# Patient Record
Sex: Female | Born: 1950
Health system: Southern US, Community
[De-identification: ages and names within clinical notes are randomized; demographics above are authoritative.]

## PROBLEM LIST (undated history)

## (undated) DIAGNOSIS — E039 Hypothyroidism, unspecified: Secondary | ICD-10-CM

## (undated) DIAGNOSIS — Z720 Tobacco use: Secondary | ICD-10-CM

## (undated) DIAGNOSIS — I119 Hypertensive heart disease without heart failure: Secondary | ICD-10-CM

## (undated) DIAGNOSIS — I252 Old myocardial infarction: Secondary | ICD-10-CM

## (undated) DIAGNOSIS — I2510 Atherosclerotic heart disease of native coronary artery without angina pectoris: Secondary | ICD-10-CM

## (undated) DIAGNOSIS — J45909 Unspecified asthma, uncomplicated: Secondary | ICD-10-CM

## (undated) DIAGNOSIS — E119 Type 2 diabetes mellitus without complications: Secondary | ICD-10-CM

## (undated) DIAGNOSIS — E785 Hyperlipidemia, unspecified: Secondary | ICD-10-CM

## (undated) DIAGNOSIS — Z955 Presence of coronary angioplasty implant and graft: Secondary | ICD-10-CM

## (undated) HISTORY — PX: ABDOMINAL HYSTERECTOMY: SHX81

## (undated) HISTORY — PX: OOPHORECTOMY: SHX86

## (undated) HISTORY — PX: CORONARY STENT PLACEMENT: SHX1402

## (undated) HISTORY — DX: Hyperlipidemia, unspecified: E78.5

## (undated) HISTORY — DX: Morbid (severe) obesity due to excess calories: E66.01

## (undated) HISTORY — PX: THORACIC OUTLET SURGERY: SHX2502

## (undated) HISTORY — DX: Hypertensive heart disease without heart failure: I11.9

## (undated) HISTORY — DX: Presence of coronary angioplasty implant and graft: Z95.5

## (undated) HISTORY — PX: CHOLECYSTECTOMY: SHX55

## (undated) HISTORY — DX: Atherosclerotic heart disease of native coronary artery without angina pectoris: I25.10

## (undated) HISTORY — PX: ANKLE SURGERY: SHX546

## (undated) HISTORY — PX: ROTATOR CUFF REPAIR: SHX139

## (undated) HISTORY — PX: KNEE ARTHROSCOPY: SUR90

## (undated) HISTORY — PX: WRIST SURGERY: SHX841

## (undated) HISTORY — DX: Tobacco use: Z72.0

---

## 2008-02-21 HISTORY — PX: CORONARY ANGIOPLASTY: SHX604

## 2014-11-15 DIAGNOSIS — M5082 Other cervical disc disorders, mid-cervical region: Secondary | ICD-10-CM | POA: Diagnosis not present

## 2014-12-13 DIAGNOSIS — I10 Essential (primary) hypertension: Secondary | ICD-10-CM | POA: Diagnosis not present

## 2014-12-13 DIAGNOSIS — I2510 Atherosclerotic heart disease of native coronary artery without angina pectoris: Secondary | ICD-10-CM | POA: Diagnosis not present

## 2014-12-13 DIAGNOSIS — E780 Pure hypercholesterolemia: Secondary | ICD-10-CM | POA: Diagnosis not present

## 2015-01-03 DIAGNOSIS — S40862A Insect bite (nonvenomous) of left upper arm, initial encounter: Secondary | ICD-10-CM | POA: Diagnosis not present

## 2015-01-31 DIAGNOSIS — E119 Type 2 diabetes mellitus without complications: Secondary | ICD-10-CM | POA: Diagnosis not present

## 2015-01-31 DIAGNOSIS — E785 Hyperlipidemia, unspecified: Secondary | ICD-10-CM | POA: Diagnosis not present

## 2015-01-31 DIAGNOSIS — E039 Hypothyroidism, unspecified: Secondary | ICD-10-CM | POA: Diagnosis not present

## 2015-02-01 DIAGNOSIS — E039 Hypothyroidism, unspecified: Secondary | ICD-10-CM | POA: Diagnosis not present

## 2015-02-01 DIAGNOSIS — I10 Essential (primary) hypertension: Secondary | ICD-10-CM | POA: Diagnosis not present

## 2015-02-01 DIAGNOSIS — E785 Hyperlipidemia, unspecified: Secondary | ICD-10-CM | POA: Diagnosis not present

## 2015-02-01 DIAGNOSIS — E669 Obesity, unspecified: Secondary | ICD-10-CM | POA: Diagnosis not present

## 2015-02-01 DIAGNOSIS — E049 Nontoxic goiter, unspecified: Secondary | ICD-10-CM | POA: Diagnosis not present

## 2015-02-01 DIAGNOSIS — Z7982 Long term (current) use of aspirin: Secondary | ICD-10-CM | POA: Diagnosis not present

## 2015-02-01 DIAGNOSIS — Z885 Allergy status to narcotic agent status: Secondary | ICD-10-CM | POA: Diagnosis not present

## 2015-02-01 DIAGNOSIS — E119 Type 2 diabetes mellitus without complications: Secondary | ICD-10-CM | POA: Diagnosis not present

## 2015-02-01 DIAGNOSIS — Z7902 Long term (current) use of antithrombotics/antiplatelets: Secondary | ICD-10-CM | POA: Diagnosis not present

## 2015-02-01 DIAGNOSIS — Z888 Allergy status to other drugs, medicaments and biological substances status: Secondary | ICD-10-CM | POA: Diagnosis not present

## 2015-02-01 DIAGNOSIS — E1159 Type 2 diabetes mellitus with other circulatory complications: Secondary | ICD-10-CM | POA: Diagnosis not present

## 2015-02-01 DIAGNOSIS — Z6841 Body Mass Index (BMI) 40.0 and over, adult: Secondary | ICD-10-CM | POA: Diagnosis not present

## 2015-02-01 DIAGNOSIS — F17210 Nicotine dependence, cigarettes, uncomplicated: Secondary | ICD-10-CM | POA: Diagnosis not present

## 2015-03-16 DIAGNOSIS — J441 Chronic obstructive pulmonary disease with (acute) exacerbation: Secondary | ICD-10-CM | POA: Diagnosis not present

## 2015-04-17 DIAGNOSIS — Z13820 Encounter for screening for osteoporosis: Secondary | ICD-10-CM | POA: Diagnosis not present

## 2015-04-17 DIAGNOSIS — N898 Other specified noninflammatory disorders of vagina: Secondary | ICD-10-CM | POA: Diagnosis not present

## 2015-04-17 DIAGNOSIS — Z1239 Encounter for other screening for malignant neoplasm of breast: Secondary | ICD-10-CM | POA: Diagnosis not present

## 2015-04-17 DIAGNOSIS — N644 Mastodynia: Secondary | ICD-10-CM | POA: Diagnosis not present

## 2015-04-17 DIAGNOSIS — Z01419 Encounter for gynecological examination (general) (routine) without abnormal findings: Secondary | ICD-10-CM | POA: Diagnosis not present

## 2015-05-11 DIAGNOSIS — Z7902 Long term (current) use of antithrombotics/antiplatelets: Secondary | ICD-10-CM | POA: Diagnosis not present

## 2015-05-11 DIAGNOSIS — E039 Hypothyroidism, unspecified: Secondary | ICD-10-CM | POA: Diagnosis not present

## 2015-05-11 DIAGNOSIS — Z7982 Long term (current) use of aspirin: Secondary | ICD-10-CM | POA: Diagnosis not present

## 2015-05-11 DIAGNOSIS — Z888 Allergy status to other drugs, medicaments and biological substances status: Secondary | ICD-10-CM | POA: Diagnosis not present

## 2015-05-11 DIAGNOSIS — Z6841 Body Mass Index (BMI) 40.0 and over, adult: Secondary | ICD-10-CM | POA: Diagnosis not present

## 2015-05-11 DIAGNOSIS — E785 Hyperlipidemia, unspecified: Secondary | ICD-10-CM | POA: Diagnosis not present

## 2015-05-11 DIAGNOSIS — E669 Obesity, unspecified: Secondary | ICD-10-CM | POA: Diagnosis not present

## 2015-05-11 DIAGNOSIS — Z885 Allergy status to narcotic agent status: Secondary | ICD-10-CM | POA: Diagnosis not present

## 2015-05-11 DIAGNOSIS — E119 Type 2 diabetes mellitus without complications: Secondary | ICD-10-CM | POA: Diagnosis not present

## 2015-05-11 DIAGNOSIS — E1159 Type 2 diabetes mellitus with other circulatory complications: Secondary | ICD-10-CM | POA: Diagnosis not present

## 2015-05-11 DIAGNOSIS — F17210 Nicotine dependence, cigarettes, uncomplicated: Secondary | ICD-10-CM | POA: Diagnosis not present

## 2015-05-11 DIAGNOSIS — I10 Essential (primary) hypertension: Secondary | ICD-10-CM | POA: Diagnosis not present

## 2015-05-11 DIAGNOSIS — E049 Nontoxic goiter, unspecified: Secondary | ICD-10-CM | POA: Diagnosis not present

## 2015-06-11 DIAGNOSIS — M1712 Unilateral primary osteoarthritis, left knee: Secondary | ICD-10-CM | POA: Diagnosis not present

## 2015-06-26 DIAGNOSIS — Z780 Asymptomatic menopausal state: Secondary | ICD-10-CM | POA: Diagnosis not present

## 2015-06-26 DIAGNOSIS — Z803 Family history of malignant neoplasm of breast: Secondary | ICD-10-CM | POA: Diagnosis not present

## 2015-06-26 DIAGNOSIS — R921 Mammographic calcification found on diagnostic imaging of breast: Secondary | ICD-10-CM | POA: Diagnosis not present

## 2015-06-26 DIAGNOSIS — E119 Type 2 diabetes mellitus without complications: Secondary | ICD-10-CM | POA: Diagnosis not present

## 2015-07-02 DIAGNOSIS — J45909 Unspecified asthma, uncomplicated: Secondary | ICD-10-CM | POA: Diagnosis not present

## 2015-08-08 DIAGNOSIS — E1165 Type 2 diabetes mellitus with hyperglycemia: Secondary | ICD-10-CM | POA: Diagnosis not present

## 2015-08-08 DIAGNOSIS — E039 Hypothyroidism, unspecified: Secondary | ICD-10-CM | POA: Diagnosis not present

## 2015-08-08 DIAGNOSIS — I2510 Atherosclerotic heart disease of native coronary artery without angina pectoris: Secondary | ICD-10-CM | POA: Diagnosis not present

## 2015-08-08 DIAGNOSIS — I10 Essential (primary) hypertension: Secondary | ICD-10-CM | POA: Diagnosis not present

## 2015-08-08 DIAGNOSIS — Z794 Long term (current) use of insulin: Secondary | ICD-10-CM | POA: Diagnosis not present

## 2015-08-08 DIAGNOSIS — E1159 Type 2 diabetes mellitus with other circulatory complications: Secondary | ICD-10-CM | POA: Diagnosis not present

## 2015-08-08 DIAGNOSIS — Z7902 Long term (current) use of antithrombotics/antiplatelets: Secondary | ICD-10-CM | POA: Diagnosis not present

## 2015-08-08 DIAGNOSIS — Z7982 Long term (current) use of aspirin: Secondary | ICD-10-CM | POA: Diagnosis not present

## 2015-08-08 DIAGNOSIS — E049 Nontoxic goiter, unspecified: Secondary | ICD-10-CM | POA: Diagnosis not present

## 2015-08-08 DIAGNOSIS — E119 Type 2 diabetes mellitus without complications: Secondary | ICD-10-CM | POA: Diagnosis not present

## 2015-08-08 DIAGNOSIS — E785 Hyperlipidemia, unspecified: Secondary | ICD-10-CM | POA: Diagnosis not present

## 2015-08-08 DIAGNOSIS — F17210 Nicotine dependence, cigarettes, uncomplicated: Secondary | ICD-10-CM | POA: Diagnosis not present

## 2015-08-21 DIAGNOSIS — Z955 Presence of coronary angioplasty implant and graft: Secondary | ICD-10-CM | POA: Diagnosis not present

## 2015-08-21 DIAGNOSIS — I2510 Atherosclerotic heart disease of native coronary artery without angina pectoris: Secondary | ICD-10-CM | POA: Diagnosis not present

## 2015-08-21 DIAGNOSIS — E7800 Pure hypercholesterolemia, unspecified: Secondary | ICD-10-CM | POA: Diagnosis not present

## 2015-08-29 DIAGNOSIS — J449 Chronic obstructive pulmonary disease, unspecified: Secondary | ICD-10-CM | POA: Diagnosis not present

## 2015-09-20 DIAGNOSIS — J0140 Acute pansinusitis, unspecified: Secondary | ICD-10-CM | POA: Diagnosis not present

## 2015-11-16 DIAGNOSIS — Z7984 Long term (current) use of oral hypoglycemic drugs: Secondary | ICD-10-CM | POA: Diagnosis not present

## 2015-11-16 DIAGNOSIS — Z7982 Long term (current) use of aspirin: Secondary | ICD-10-CM | POA: Diagnosis not present

## 2015-11-16 DIAGNOSIS — E7800 Pure hypercholesterolemia, unspecified: Secondary | ICD-10-CM | POA: Diagnosis not present

## 2015-11-16 DIAGNOSIS — E049 Nontoxic goiter, unspecified: Secondary | ICD-10-CM | POA: Diagnosis not present

## 2015-11-16 DIAGNOSIS — E1165 Type 2 diabetes mellitus with hyperglycemia: Secondary | ICD-10-CM | POA: Diagnosis not present

## 2015-11-16 DIAGNOSIS — Z888 Allergy status to other drugs, medicaments and biological substances status: Secondary | ICD-10-CM | POA: Diagnosis not present

## 2015-11-16 DIAGNOSIS — E785 Hyperlipidemia, unspecified: Secondary | ICD-10-CM | POA: Diagnosis not present

## 2015-11-16 DIAGNOSIS — J45909 Unspecified asthma, uncomplicated: Secondary | ICD-10-CM | POA: Diagnosis not present

## 2015-11-16 DIAGNOSIS — E039 Hypothyroidism, unspecified: Secondary | ICD-10-CM | POA: Diagnosis not present

## 2015-11-16 DIAGNOSIS — I2510 Atherosclerotic heart disease of native coronary artery without angina pectoris: Secondary | ICD-10-CM | POA: Diagnosis not present

## 2015-11-16 DIAGNOSIS — E669 Obesity, unspecified: Secondary | ICD-10-CM | POA: Diagnosis not present

## 2015-11-16 DIAGNOSIS — Z885 Allergy status to narcotic agent status: Secondary | ICD-10-CM | POA: Diagnosis not present

## 2015-11-16 DIAGNOSIS — I252 Old myocardial infarction: Secondary | ICD-10-CM | POA: Diagnosis not present

## 2015-11-16 DIAGNOSIS — F17210 Nicotine dependence, cigarettes, uncomplicated: Secondary | ICD-10-CM | POA: Diagnosis not present

## 2015-11-16 DIAGNOSIS — Z6841 Body Mass Index (BMI) 40.0 and over, adult: Secondary | ICD-10-CM | POA: Diagnosis not present

## 2015-11-16 DIAGNOSIS — E119 Type 2 diabetes mellitus without complications: Secondary | ICD-10-CM | POA: Diagnosis not present

## 2015-11-16 DIAGNOSIS — Z7902 Long term (current) use of antithrombotics/antiplatelets: Secondary | ICD-10-CM | POA: Diagnosis not present

## 2015-11-16 DIAGNOSIS — I10 Essential (primary) hypertension: Secondary | ICD-10-CM | POA: Diagnosis not present

## 2015-11-25 DIAGNOSIS — R05 Cough: Secondary | ICD-10-CM | POA: Diagnosis not present

## 2015-11-25 DIAGNOSIS — J0100 Acute maxillary sinusitis, unspecified: Secondary | ICD-10-CM | POA: Diagnosis not present

## 2015-12-11 DIAGNOSIS — J0140 Acute pansinusitis, unspecified: Secondary | ICD-10-CM | POA: Diagnosis not present

## 2016-01-10 DIAGNOSIS — J0140 Acute pansinusitis, unspecified: Secondary | ICD-10-CM | POA: Diagnosis not present

## 2016-02-15 DIAGNOSIS — F411 Generalized anxiety disorder: Secondary | ICD-10-CM | POA: Diagnosis not present

## 2016-02-15 DIAGNOSIS — H9121 Sudden idiopathic hearing loss, right ear: Secondary | ICD-10-CM | POA: Diagnosis not present

## 2016-02-19 DIAGNOSIS — J0140 Acute pansinusitis, unspecified: Secondary | ICD-10-CM | POA: Diagnosis not present

## 2016-02-22 DIAGNOSIS — M1712 Unilateral primary osteoarthritis, left knee: Secondary | ICD-10-CM | POA: Diagnosis not present

## 2016-02-29 ENCOUNTER — Encounter (HOSPITAL_COMMUNITY): Payer: Self-pay | Admitting: *Deleted

## 2016-02-29 ENCOUNTER — Emergency Department (HOSPITAL_COMMUNITY)
Admission: AD | Admit: 2016-02-29 | Discharge: 2016-02-29 | Disposition: A | Discharge status: Home / Self Care | Payer: Medicare Other | Source: Ambulatory Visit | Attending: Emergency Medicine | Admitting: Emergency Medicine

## 2016-02-29 ENCOUNTER — Inpatient Hospital Stay (HOSPITAL_COMMUNITY): Payer: Medicare Other

## 2016-02-29 DIAGNOSIS — Z79899 Other long term (current) drug therapy: Secondary | ICD-10-CM | POA: Insufficient documentation

## 2016-02-29 DIAGNOSIS — Z955 Presence of coronary angioplasty implant and graft: Secondary | ICD-10-CM | POA: Insufficient documentation

## 2016-02-29 DIAGNOSIS — E039 Hypothyroidism, unspecified: Secondary | ICD-10-CM | POA: Diagnosis not present

## 2016-02-29 DIAGNOSIS — J209 Acute bronchitis, unspecified: Secondary | ICD-10-CM | POA: Insufficient documentation

## 2016-02-29 DIAGNOSIS — F17210 Nicotine dependence, cigarettes, uncomplicated: Secondary | ICD-10-CM | POA: Diagnosis not present

## 2016-02-29 DIAGNOSIS — R0602 Shortness of breath: Secondary | ICD-10-CM

## 2016-02-29 DIAGNOSIS — E119 Type 2 diabetes mellitus without complications: Secondary | ICD-10-CM | POA: Insufficient documentation

## 2016-02-29 DIAGNOSIS — J45909 Unspecified asthma, uncomplicated: Secondary | ICD-10-CM | POA: Diagnosis not present

## 2016-02-29 DIAGNOSIS — Z7982 Long term (current) use of aspirin: Secondary | ICD-10-CM | POA: Diagnosis not present

## 2016-02-29 DIAGNOSIS — Z7984 Long term (current) use of oral hypoglycemic drugs: Secondary | ICD-10-CM | POA: Insufficient documentation

## 2016-02-29 DIAGNOSIS — Z7901 Long term (current) use of anticoagulants: Secondary | ICD-10-CM | POA: Diagnosis not present

## 2016-02-29 DIAGNOSIS — I252 Old myocardial infarction: Secondary | ICD-10-CM | POA: Diagnosis not present

## 2016-02-29 DIAGNOSIS — I10 Essential (primary) hypertension: Secondary | ICD-10-CM | POA: Insufficient documentation

## 2016-02-29 HISTORY — DX: Unspecified asthma, uncomplicated: J45.909

## 2016-02-29 HISTORY — DX: Hypothyroidism, unspecified: E03.9

## 2016-02-29 HISTORY — DX: Type 2 diabetes mellitus without complications: E11.9

## 2016-02-29 HISTORY — DX: Old myocardial infarction: I25.2

## 2016-02-29 LAB — CBC WITH DIFFERENTIAL/PLATELET
BASOS ABS: 0 10*3/uL (ref 0.0–0.1)
Basophils Relative: 0 %
Eosinophils Absolute: 0.2 10*3/uL (ref 0.0–0.7)
Eosinophils Relative: 2 %
HEMATOCRIT: 50.5 % — AB (ref 36.0–46.0)
HEMOGLOBIN: 16.1 g/dL — AB (ref 12.0–15.0)
LYMPHS PCT: 32 %
Lymphs Abs: 3.1 10*3/uL (ref 0.7–4.0)
MCH: 30.6 pg (ref 26.0–34.0)
MCHC: 31.9 g/dL (ref 30.0–36.0)
MCV: 95.8 fL (ref 78.0–100.0)
Monocytes Absolute: 0.6 10*3/uL (ref 0.1–1.0)
Monocytes Relative: 6 %
NEUTROS ABS: 6 10*3/uL (ref 1.7–7.7)
NEUTROS PCT: 60 %
Platelets: 143 10*3/uL — ABNORMAL LOW (ref 150–400)
RBC: 5.27 MIL/uL — AB (ref 3.87–5.11)
RDW: 15.3 % (ref 11.5–15.5)
WBC: 9.8 10*3/uL (ref 4.0–10.5)

## 2016-02-29 LAB — COMPREHENSIVE METABOLIC PANEL
ALBUMIN: 3.6 g/dL (ref 3.5–5.0)
ALT: 15 U/L (ref 14–54)
ANION GAP: 7 (ref 5–15)
AST: 14 U/L — ABNORMAL LOW (ref 15–41)
Alkaline Phosphatase: 78 U/L (ref 38–126)
BILIRUBIN TOTAL: 0.7 mg/dL (ref 0.3–1.2)
BUN: 12 mg/dL (ref 6–20)
CO2: 27 mmol/L (ref 22–32)
Calcium: 9.5 mg/dL (ref 8.9–10.3)
Chloride: 107 mmol/L (ref 101–111)
Creatinine, Ser: 0.86 mg/dL (ref 0.44–1.00)
GFR calc Af Amer: >60 mL/min (ref >60)
GLUCOSE: 137 mg/dL — AB (ref 65–99)
POTASSIUM: 4.1 mmol/L (ref 3.5–5.1)
Sodium: 141 mmol/L (ref 135–145)
TOTAL PROTEIN: 7.3 g/dL (ref 6.5–8.1)

## 2016-02-29 LAB — BRAIN NATRIURETIC PEPTIDE: B Natriuretic Peptide: 38 pg/mL (ref 0.0–100.0)

## 2016-02-29 LAB — TROPONIN I

## 2016-02-29 MED ORDER — IPRATROPIUM-ALBUTEROL 0.5-2.5 (3) MG/3ML IN SOLN
3.0000 mL | Freq: Once | RESPIRATORY_TRACT | Status: AC
  Administered 2016-02-29: 3 mL via RESPIRATORY_TRACT
  Filled 2016-02-29: qty 3

## 2016-02-29 MED ORDER — PREDNISONE 20 MG PO TABS
60.0000 mg | ORAL_TABLET | Freq: Once | ORAL | Status: AC
  Administered 2016-02-29: 60 mg via ORAL
  Filled 2016-02-29: qty 3

## 2016-02-29 MED ORDER — PREDNISONE 20 MG PO TABS
40.0000 mg | ORAL_TABLET | Freq: Every day | ORAL | Status: DC
Start: 2016-02-29 — End: 2016-10-31

## 2016-02-29 MED ORDER — AZITHROMYCIN 250 MG PO TABS: ORAL_TABLET | ORAL | Status: DC

## 2016-02-29 MED ORDER — AZITHROMYCIN 250 MG PO TABS
500.0000 mg | ORAL_TABLET | Freq: Once | ORAL | Status: AC
  Administered 2016-02-29: 500 mg via ORAL
  Filled 2016-02-29: qty 2

## 2016-02-29 NOTE — ED Notes (Signed)
Pt here with SOB x 4 days and back pain on the right side.

## 2016-02-29 NOTE — MAU Note (Addendum)
Pt reports she started having SOB for 4 days. C/o Pain in the back LEft lung when she takes deep breath or movement. Has been on Antibiotic for URI last week. (ended on Saturday)

## 2016-02-29 NOTE — ED Notes (Signed)
Pt ambulates independently and with steady gait at time of discharge. Discharge instructions and follow up information reviewed with patient. No other questions or concerns voiced at this time.  

## 2016-02-29 NOTE — MAU Provider Note (Signed)
History    CSN: [id number] Arrival date and time: 02/29/16 1729 First Provider Initiated Contact with Patient 02/29/16 1819     Chief Complaint  Patient presents with  . Shortness of Breath   HPI [name] [name] is a 65 y.o. female with a history of MI with tsent in 2010, HTN, T2DM, extensive smoking history presenting with shortness of breath and associated left lower lung pain. She reports that she has had sx for 1 week. She reports she had a sinus infection- last dose azithromycin (250 mg), was on Zpack. Lung pain started on Sunday, worsening in the last 5 days.  Reports pain located back left, worse with deep breathing.  Worse with movement.  SOB worsens with exertion and pain worsens with walking. Denies chest pain. No history of CHF per patient but she does take lasix every 5 days for "heaviness in her chest." Unable to lie flat but has baseline 2 pillow orthopnea. Smokes tobacco 1/2 ppd (previously 1 ppd for 26 years).   Last HA1C 10% per patient  OB History    Gravida Para Term Preterm AB TAB SAB Ectopic Multiple Living   2 2        2       Past Medical History  Diagnosis Date  . Hypertension   . MI, old   . Diabetes mellitus without complication ([place])   . Asthma   . Bronchitis, asthmatic   . Hypothyroidism     Past Surgical History  Procedure Laterality Date  . Abdominal hysterectomy    . Coronary stent placement    . Cholecystectomy    . Ankle surgery    . Knee arthroscopy    . Wrist surgery      No family history on file.  Social History  Substance Use Topics  . Smoking status: Current Every Day Smoker -- 0.50 packs/day    Types: Cigarettes  . Smokeless tobacco: None  . Alcohol Use: No    Allergies:  Allergies  Allergen Reactions  . Reglan [Metoclopramide] Other (See Comments)    Hallucinations/Violent   . Dilaudid [Hydromorphone Hcl] Nausea And Vomiting  . Tape Rash    Blisters     Prescriptions prior to admission  Medication Sig Dispense Refill Last  Dose  . aspirin 81 MG tablet Take 81 mg by mouth daily.   02/29/2016 at Unknown time  . atorvastatin (LIPITOR) 10 MG tablet Take 10 mg by mouth daily.  2 02/29/2016 at Unknown time  . celecoxib (CELEBREX) 200 MG capsule Take 200 mg by mouth daily.  2 02/29/2016 at Unknown time  . clopidogrel (PLAVIX) 75 MG tablet Take 75 mg by mouth daily.  2 02/29/2016 at 1400  . furosemide (LASIX) 40 MG tablet Take 40 mg by mouth daily as needed for fluid.   Past Week at Unknown time  . INVOKANA 100 MG TABS tablet Take 100 mg by mouth daily.  11 02/29/2016 at Unknown time  . isosorbide mononitrate (IMDUR) 60 MG 24 hr tablet Take 60 mg by mouth daily.  3 02/29/2016 at Unknown time  . levothyroxine (SYNTHROID, LEVOTHROID) 150 MCG tablet Take 150 mcg by mouth daily.  1 02/29/2016 at Unknown time  . metFORMIN (GLUCOPHAGE) 500 MG tablet Take 500 mg by mouth daily.  3 02/29/2016 at Unknown time  . metoprolol succinate (TOPROL-XL) 50 MG 24 hr tablet Take 50 mg by mouth daily. Take with or immediately following a meal.   02/29/2016 at 1400    Review of Systems  Constitutional: Negative for fever and chills.  Eyes: Negative for blurred vision and double vision.  Respiratory: Positive for cough and shortness of breath.   Cardiovascular: Positive for chest pain. Negative for orthopnea.  Gastrointestinal: Negative for nausea and vomiting.  Genitourinary: Negative for dysuria, frequency and flank pain.  Musculoskeletal: Negative for myalgias.  Skin: Negative for rash.  Neurological: Negative for dizziness, tingling, weakness and headaches.  Endo/Heme/Allergies: Does not bruise/bleed easily.  Psychiatric/Behavioral: Negative for depression and suicidal ideas. The patient is not nervous/anxious.    Physical Exam   Blood pressure 126/77, pulse 87, temperature 98.6 F (37 C), temperature source Oral, resp. rate 22, SpO2 95 %.  Physical Exam  Nursing note and vitals reviewed. Constitutional: She is oriented to person, place, and  time. She appears well-developed and well-nourished. No distress.  Appears older than stated age. Breathless with changes in position.  Talking in complete sentences. Increased RR with reclining to 45 degrees  HENT:  Head: Normocephalic and atraumatic.  Eyes: Conjunctivae are normal. No scleral icterus.  Neck: Normal range of motion. Neck supple. No JVD present.  Cardiovascular: Normal rate and intact distal pulses.  Exam reveals no gallop and no friction rub.   No murmur heard. Distant heart sounds  Respiratory: Effort normal. She has wheezes (occasional wheezes). She has no rales. She exhibits no tenderness.  Increased I:E ratio  GI: Soft. There is no tenderness. There is no rebound and no guarding.  Genitourinary: Vagina normal.  Musculoskeletal: Normal range of motion. She exhibits no edema.  Calves are symmetric, non-erythematous/non tender.   Neurological: She is alert and oriented to person, place, and time.  Skin: Skin is warm and dry. No rash noted.  Psychiatric: She has a normal mood and affect.    MAU Course  Procedures  MDM Drew labs --Troponin, BNP, CMP  Reviewed EKG personally -- NSR, no ST elevations/depressions and no flipped T waves in lateral lead. Does have right axis deviation. I do think this patient is having an acute ST segment MI but needs further evaluation.   6:54 PM Spoke with Dr. Rulon Abide regarding patient transfer to [facility].  She will accept transfer  7:35 PM reviewed CXR- no effusion noted. No curly B lines. Cardiac silhouette is not enlarged  Assessment and Plan  65 y.o. female with history of MI s/p stent in 2010, HTN, poorly controlled DM, extensive smoking history presenting with left posterior chest pain that is exertional, improves with rest associated with shortness of breath. Overall this is likely atypical chest pain but given risk factors for cardiac causes feel patient is best served at [facility].    #Chest pain-- atypical chest  pain but patient with cardiac risk factors, without troponin TIMI score 2  - transfer for proximity to appropriate work up modalities - No need for nitroglycerin/ASA at this time.  - [facility] arrived to transport the patient  Caren Macadam 02/29/2016, 6:19 PM

## 2016-02-29 NOTE — ED Provider Notes (Signed)
CSN: [id number]     Arrival date & time 02/29/16  1729 History   First MD Initiated Contact with Patient 02/29/16 2011     Chief Complaint  Patient presents with  . Shortness of Breath    (Consider location/radiation/quality/duration/timing/severity/associated sxs/prior Treatment) Patient is a 65 y.o. female presenting with shortness of breath. The history is provided by the patient and medical records. No language interpreter was used.  Shortness of Breath Associated symptoms: cough   Associated symptoms: no abdominal pain, no fever, no headaches, no neck pain, no rash, no vomiting and no wheezing    [name] [name] is a 65 y.o. female  with a PMH of HTN, DM, asthma, MI 15 yrs ago with stent placement who presents to the Emergency Department complaining of left sided "lung pain" that is worse with breathing x 4 days. Associated shortness of breath with exertion, nasal congestion, and productive cough. No shortness of breath at rest. No chest pain, abdominal, pain, diaphoresis, n/v/d, fever, back pain. Home inhalers and nebulizer used with minimal relief. Patient states pain is similar to when she had pleurisy in the past. + Every day smoker.   Past Medical History  Diagnosis Date  . Hypertension   . MI, old   . Diabetes mellitus without complication ([place])   . Asthma   . Bronchitis, asthmatic   . Hypothyroidism    Past Surgical History  Procedure Laterality Date  . Abdominal hysterectomy    . Coronary stent placement    . Cholecystectomy    . Ankle surgery    . Knee arthroscopy    . Wrist surgery     No family history on file. Social History  Substance Use Topics  . Smoking status: Current Every Day Smoker -- 0.50 packs/day    Types: Cigarettes  . Smokeless tobacco: None  . Alcohol Use: No   OB History    Gravida Para Term Preterm AB TAB SAB Ectopic Multiple Living   2 2        2      Review of Systems  Constitutional: Negative for fever and chills.  HENT: Negative for  congestion.   Eyes: Negative for visual disturbance.  Respiratory: Positive for cough and shortness of breath. Negative for wheezing.   Cardiovascular: Negative.   Gastrointestinal: Negative for nausea, vomiting and abdominal pain.  Genitourinary: Negative for dysuria.  Musculoskeletal: Negative for back pain and neck pain.  Skin: Negative for rash.  Neurological: Negative for dizziness and headaches.      Allergies  Reglan; Dilaudid; and Tape  Home Medications   Prior to Admission medications   Medication Sig Start Date End Date Taking? Authorizing Provider  aspirin 81 MG tablet Take 81 mg by mouth daily.   Yes Historical Provider, MD  atorvastatin (LIPITOR) 10 MG tablet Take 10 mg by mouth daily. 02/18/16  Yes Historical Provider, MD  celecoxib (CELEBREX) 200 MG capsule Take 200 mg by mouth daily. 02/22/16  Yes Historical Provider, MD  clopidogrel (PLAVIX) 75 MG tablet Take 75 mg by mouth daily. 01/10/16  Yes Historical Provider, MD  furosemide (LASIX) 40 MG tablet Take 40 mg by mouth daily as needed for fluid.   Yes Historical Provider, MD  INVOKANA 100 MG TABS tablet Take 100 mg by mouth daily. 02/13/16  Yes Historical Provider, MD  isosorbide mononitrate (IMDUR) 60 MG 24 hr tablet Take 60 mg by mouth daily. 11/30/15  Yes Historical Provider, MD  levothyroxine (SYNTHROID, LEVOTHROID) 150 MCG tablet Take 150 mcg  by mouth daily. 12/12/15  Yes Historical Provider, MD  metFORMIN (GLUCOPHAGE) 500 MG tablet Take 500 mg by mouth daily. 12/12/15  Yes Historical Provider, MD  metoprolol succinate (TOPROL-XL) 50 MG 24 hr tablet Take 50 mg by mouth daily. Take with or immediately following a meal.   Yes Historical Provider, MD   BP 103/69 mmHg  Pulse 71  Temp(Src) 98.6 F (37 C) (Oral)  Resp 18  SpO2 97% Physical Exam  Constitutional: She is oriented to person, place, and time. She appears well-developed and well-nourished.  Alert and in no acute distress  HENT:  Head: Normocephalic and  atraumatic.  OP with erythema, no exudates.  Cardiovascular: Normal rate, regular rhythm and normal heart sounds.   Pulmonary/Chest: Effort normal. No respiratory distress. She exhibits no tenderness.  Diminished breath sounds with expiratory wheezing bilaterally.   Abdominal: Soft. She exhibits no distension. There is no tenderness.  Musculoskeletal: She exhibits no edema.  Neurological: She is alert and oriented to person, place, and time.  Skin: Skin is warm and dry.  Nursing note and vitals reviewed.   ED Course  Procedures (including critical care time) Labs Review Labs Reviewed  COMPREHENSIVE METABOLIC PANEL - Abnormal; Notable for the following:    Glucose, Bld 137 (*)    AST 14 (*)    All other components within normal limits  TROPONIN I  BRAIN NATRIURETIC PEPTIDE    Imaging Review Dg Chest 2 View  02/29/2016  CLINICAL DATA:  Shortness of breath for 1 week EXAM: CHEST  2 VIEW COMPARISON:  None. FINDINGS: Cardiac shadow is within normal limits. Lungs are hyperaerated bilaterally. No focal infiltrate or sizable effusion is seen. No acute bony abnormality is noted. Calcified granuloma is noted in the left mid lung. IMPRESSION: COPD without acute abnormality. Electronically Signed   By: Inez Catalina M.D.   On: 02/29/2016 19:24   I have personally reviewed and evaluated these images and lab results as part of my medical decision-making.   EKG Interpretation None      MDM   Final diagnoses:  Shortness of breath   [name] presents with worsening productive cough, shortness of breath. History of COPD.  Labs: CBC, CMP, troponin, BNP reviewed and reassuring. Imaging: Chest x-ray unremarkable. EKG reassuring.  DuoNeb given in ED. Patient evaluated with on sounds improved and shortness of breath improved.  Given worsening productive cough, + smoker, and COPD will tx with azithro. Short steroid burst given for wheezing/sob. PCP follow up strongly encouraged. Return  precautions given and all questions answered.    [facility] [place], PA-C 02/29/16 2131  Fredia Sorrow, MD 03/07/16 1021

## 2016-02-29 NOTE — Discharge Instructions (Signed)
Please take all of your antibiotics until finished!  Take steroid as directed.  Continue using your home inhalers.  Follow-up with your primary care physician in regards to today's visit. Return to the ER for new or worsening symptoms, any additional concerns.

## 2016-04-11 ENCOUNTER — Telehealth: Payer: Self-pay | Admitting: Cardiovascular Disease

## 2016-04-11 NOTE — Telephone Encounter (Signed)
Records received from [facility] for apt on 04/16/16 w/ Dr Oval Linsey given to nenita H in medical records

## 2016-04-14 DIAGNOSIS — E1165 Type 2 diabetes mellitus with hyperglycemia: Secondary | ICD-10-CM | POA: Diagnosis not present

## 2016-04-14 DIAGNOSIS — E039 Hypothyroidism, unspecified: Secondary | ICD-10-CM | POA: Diagnosis not present

## 2016-04-14 DIAGNOSIS — I2510 Atherosclerotic heart disease of native coronary artery without angina pectoris: Secondary | ICD-10-CM | POA: Diagnosis not present

## 2016-04-16 ENCOUNTER — Encounter: Payer: Self-pay | Admitting: Cardiovascular Disease

## 2016-04-16 ENCOUNTER — Ambulatory Visit (INDEPENDENT_AMBULATORY_CARE_PROVIDER_SITE_OTHER): Payer: Medicare Other | Admitting: Cardiovascular Disease

## 2016-04-16 VITALS — BP 110/80 | HR 66 | Ht 65.0 in | Wt 281.2 lb

## 2016-04-16 DIAGNOSIS — E785 Hyperlipidemia, unspecified: Secondary | ICD-10-CM

## 2016-04-16 DIAGNOSIS — Z955 Presence of coronary angioplasty implant and graft: Secondary | ICD-10-CM

## 2016-04-16 DIAGNOSIS — I119 Hypertensive heart disease without heart failure: Secondary | ICD-10-CM | POA: Diagnosis not present

## 2016-04-16 DIAGNOSIS — Z720 Tobacco use: Secondary | ICD-10-CM

## 2016-04-16 DIAGNOSIS — F17200 Nicotine dependence, unspecified, uncomplicated: Secondary | ICD-10-CM | POA: Insufficient documentation

## 2016-04-16 DIAGNOSIS — Z79899 Other long term (current) drug therapy: Secondary | ICD-10-CM

## 2016-04-16 DIAGNOSIS — M170 Bilateral primary osteoarthritis of knee: Secondary | ICD-10-CM | POA: Diagnosis not present

## 2016-04-16 DIAGNOSIS — E1169 Type 2 diabetes mellitus with other specified complication: Secondary | ICD-10-CM | POA: Insufficient documentation

## 2016-04-16 HISTORY — DX: Presence of coronary angioplasty implant and graft: Z95.5

## 2016-04-16 HISTORY — DX: Hypertensive heart disease without heart failure: I11.9

## 2016-04-16 HISTORY — DX: Tobacco use: Z72.0

## 2016-04-16 HISTORY — DX: Morbid (severe) obesity due to excess calories: E66.01

## 2016-04-16 MED ORDER — CLOPIDOGREL BISULFATE 75 MG PO TABS
75.0000 mg | ORAL_TABLET | Freq: Every day | ORAL | 3 refills | Status: DC
Start: 2016-04-16 — End: 2016-10-31

## 2016-04-16 NOTE — Patient Instructions (Signed)
Your physician wants you to follow-up in: 6 months with Dr. Oval Linsey. You will receive a reminder letter in the mail two months in advance. If you don't receive a letter, please call our office to schedule the follow-up appointment.  If you need a refill on your cardiac medications before your next appointment, please call your pharmacy.

## 2016-04-16 NOTE — Progress Notes (Signed)
Cardiology Office Note   Date:  04/16/2016   ID:  [name] [name], [name] [name] 1951/02/25, MRN [id number]  PCP:  No PCP Per Patient  Cardiologist:   Skeet Latch, MD   Chief Complaint  Patient presents with  . Follow-up    Pt states no Sx. Pt just need refills on medication.     History of Present Illness: [name] [name] is a 65 y.o. female with CAD /sp PCI, COPD, morbid obesity, chronic bronchitis, and hypothyroidism who presents to establish care.  [name] recently moved here from [place].  She saw her cardiologist, Dr. Jannet [name] on 07/2015.  She had a BMS in the LAD 02/2008.  Since then she has done very well.  She had a 2 day Persantine Cardiolite on 01/07/13 that was negative for ischemia. LVEF was 79%.  She denies any recent chest pain or shortness of breath.  She does not get any formal exercise but cleans her house and denies exertional symptoms.  She lost nearly 50lb by changing her diet and eliminating sweets.  She continues to smoke 4 cigarettes daily, down from 1 ppd.  She also uses a vapor cigarette twice daily.  [name] denies lower extremity edema, orthopnea or PND.     Past Medical History:  Diagnosis Date  . Coronary artery disease    BMS to LAD 02/2008  . Diabetes mellitus without complication ([place])   . Hyperlipidemia   . Hypertensive heart disease 04/16/2016  . Morbid obesity ([place])   . Morbid obesity ([place]) 04/16/2016  . S/P coronary artery stent placement 04/16/2016   BMS to LAD 2009  . Tobacco abuse 04/16/2016    Past Surgical History:  Procedure Laterality Date  . ABDOMINAL HYSTERECTOMY    . ANKLE SURGERY Right   . CHOLECYSTECTOMY    . CORONARY ANGIOPLASTY  02/2008   bare metal stent to LAD   . KNEE ARTHROSCOPY    . ROTATOR CUFF REPAIR Bilateral   . THORACIC OUTLET SURGERY       Current Outpatient Prescriptions  Medication Sig Dispense Refill  . ALPRAZolam (XANAX) 0.5 MG tablet Take 0.5 mg by mouth as needed for anxiety.    Marland Kitchen aspirin 81 MG tablet Take 81  mg by mouth daily.    Marland Kitchen atorvastatin (LIPITOR) 10 MG tablet Take 10 mg by mouth daily.    . canagliflozin (INVOKANA) 100 MG TABS tablet Take 200 mg by mouth daily before breakfast.     . celecoxib (CELEBREX) 200 MG capsule Take 200 mg by mouth 2 (two) times daily.    . clopidogrel (PLAVIX) 75 MG tablet Take 1 tablet (75 mg total) by mouth daily. 90 tablet 3  . Fluticasone-Salmeterol (ADVAIR DISKUS) 250-50 MCG/DOSE AEPB Inhale 1 puff into the lungs 2 (two) times daily.    . furosemide (LASIX) 40 MG tablet Take 40 mg by mouth.    . isosorbide mononitrate (IMDUR) 60 MG 24 hr tablet Take 60 mg by mouth daily.    Marland Kitchen levothyroxine (SYNTHROID, LEVOTHROID) 175 MCG tablet Take 175 mcg by mouth daily before breakfast.    . metFORMIN (GLUCOPHAGE) 500 MG tablet Take 500 mg by mouth daily with breakfast.    . metoprolol succinate (TOPROL-XL) 50 MG 24 hr tablet Take 75 mg by mouth daily. Take with or immediately following a meal.    . nitroGLYCERIN (NITROSTAT) 0.4 MG SL tablet Place 0.4 mg under the tongue every 5 (five) minutes as needed for chest pain.     No  current facility-administered medications for this visit.     Allergies:   Codeine; Dilaudid [hydromorphone hcl]; Iodides; and Reglan [metoclopramide]    Social History:  The patient  reports that she has been smoking.  She does not have any smokeless tobacco history on file.   Family History:  The patient's family history includes Cancer in her mother.    ROS:  Please see the history of present illness.   Otherwise, review of systems are positive for back and knee pain.   All other systems are reviewed and negative.    PHYSICAL EXAM: VS:  BP 110/80   Pulse 66   Ht 5\' 5"  (1.651 m)   Wt 281 lb 3.2 oz (127.6 kg)   BMI 46.79 kg/m  , BMI Body mass index is 46.79 kg/m. GENERAL:  Well appearing.  Morbidly obese HEENT:  Pupils equal round and reactive, fundi not visualized, oral mucosa unremarkable NECK:  No jugular venous distention, waveform  within normal limits, carotid upstroke brisk and symmetric, no bruits, no thyromegaly LYMPHATICS:  No cervical adenopathy LUNGS:  Clear to auscultation bilaterally HEART:  RRR.  PMI not displaced or sustained,S1 and S2 within normal limits, no S3, no S4, no clicks, no rubs, no murmurs ABD:  Flat, positive bowel sounds normal in frequency in pitch, no bruits, no rebound, no guarding, no midline pulsatile mass, no hepatomegaly, no splenomegaly EXT:  2 plus pulses throughout, no edema, no cyanosis no clubbing SKIN:  No rashes no nodules NEURO:  Cranial nerves II through XII grossly intact, motor grossly intact throughout PSYCH:  Cognitively intact, oriented to person place and time   EKG:  EKG is ordered today. The ekg ordered today demonstrates sinus rhythm rate 66 bpm.    Recent Labs: No results found for requested labs within last 8760 hours.    Lipid Panel No results found for: CHOL, TRIG, HDL, CHOLHDL, VLDL, LDLCALC, LDLDIRECT    Wt Readings from Last 3 Encounters:  04/16/16 281 lb 3.2 oz (127.6 kg)      ASSESSMENT AND PLAN:  # CAD s/p BMS to LAD: [name] is doing well and denies any angina.  She has been on DAPT since her stent placement in 2009. It would be reasonable to stop the clopidogrel.  However, given that she has been Stable and denies any bleeding, she would prefer to continue it.  Continue Imdur and metoprolol.  # Hypertensive heart disease: BP well-controlled.  Continue metoprolol.  # Hyperlipidemia: Continue atorvastatin.  Check lipids at follow-up.  # Morbid obesity: Ms. Cure was congratulated on her weight loss and encouraged to continue.  # Tobacco abuse: Patient was encouraged to keep cutting back.   Current medicines are reviewed at length with the patient today.  The patient does not have concerns regarding medicines.  The following changes have been made:  no change  Labs/ tests ordered today include:   Orders Placed This Encounter  Procedures    . EKG 12-Lead     Disposition:   FU with [name] Sicard C. Oval Linsey, MD, [facility] in 6 months.      This note was written with the assistance of speech recognition software.  Please excuse any transcriptional errors.  Signed, Eyad Rochford C. Oval Linsey, MD, [facility]  04/16/2016 4:54 PM    [facility]

## 2016-04-17 ENCOUNTER — Encounter (HOSPITAL_COMMUNITY): Payer: Self-pay | Admitting: *Deleted

## 2016-05-21 DIAGNOSIS — M1712 Unilateral primary osteoarthritis, left knee: Secondary | ICD-10-CM | POA: Diagnosis not present

## 2016-05-21 DIAGNOSIS — M5136 Other intervertebral disc degeneration, lumbar region: Secondary | ICD-10-CM | POA: Diagnosis not present

## 2016-05-23 ENCOUNTER — Other Ambulatory Visit: Payer: Self-pay | Admitting: *Deleted

## 2016-05-27 MED ORDER — ATORVASTATIN CALCIUM 10 MG PO TABS: 10.0000 mg | ORAL_TABLET | Freq: Every day | ORAL | 10 refills | Status: DC

## 2016-05-27 NOTE — Telephone Encounter (Signed)
Rx(s) sent to pharmacy electronically.  

## 2016-06-02 ENCOUNTER — Other Ambulatory Visit: Payer: Self-pay

## 2016-06-02 MED ORDER — ISOSORBIDE MONONITRATE ER 60 MG PO TB24: 60.0000 mg | ORAL_TABLET | Freq: Every day | ORAL | 3 refills | Status: DC

## 2016-06-04 DIAGNOSIS — M1712 Unilateral primary osteoarthritis, left knee: Secondary | ICD-10-CM | POA: Diagnosis not present

## 2016-06-23 DIAGNOSIS — J0140 Acute pansinusitis, unspecified: Secondary | ICD-10-CM | POA: Diagnosis not present

## 2016-08-18 DIAGNOSIS — E1165 Type 2 diabetes mellitus with hyperglycemia: Secondary | ICD-10-CM | POA: Diagnosis not present

## 2016-08-18 DIAGNOSIS — E039 Hypothyroidism, unspecified: Secondary | ICD-10-CM | POA: Diagnosis not present

## 2016-08-18 DIAGNOSIS — E7800 Pure hypercholesterolemia, unspecified: Secondary | ICD-10-CM | POA: Diagnosis not present

## 2016-10-20 DIAGNOSIS — E039 Hypothyroidism, unspecified: Secondary | ICD-10-CM | POA: Diagnosis not present

## 2016-10-24 ENCOUNTER — Ambulatory Visit: Payer: Self-pay | Admitting: Cardiovascular Disease

## 2016-10-30 NOTE — Progress Notes (Signed)
Cardiology Office Note   Date:  10/31/2016   ID:  Vayah, Blot 08-Jun-1951, MRN [id number]  PCP:  Briscoe Deutscher, DO  Cardiologist:   Skeet Latch, MD   No chief complaint on file.    History of Present Illness: [name] [name] is a 66 y.o. female with CAD s/p PCI, COPD, morbid obesity, chronic bronchitis, and hypothyroidism who presents to for follow up.  [name] [name] recently moved here from [place].  She had a BMS in the LAD 02/2008.  Since then she has done very well.  She had a 2 day Persantine Cardiolite on 01/07/13 that was negative for ischemia. LVEF was 79%.  She denies any recent chest pain or shortness of breath. She has been feeling stressed lately. Her son had a bad accident at his job in October and broke both his wrist and femur. He was living in the home with her and recently was able to move back to his home. She's also the primary caretaker of her 54-year-old grandson who has ADD. Her husband also suffers from posttraumatic stress disorder.  She hasn't experienced any chest pain or pressure. Her breathing has been fine. She hasn't been able to exercise lately because of pain in her left knee. She has been getting injections in the knee but it is starting to wear off. She denies lower extremity edema, orthopnea, or PND.    She does not get any formal exercise but cleans her house and denies exertional symptoms.  She lost nearly 50lb by changing her diet and eliminating sweets.  She continues to smoke 4 cigarettes daily, down from 1 ppd.   [name] [name] denies lower extremity edema, orthopnea or PND.  [name] [name] reports occasional dizziness.  She had an episode of syncope that occurred this AM after getting out of the shower.  She notes that she hasn't been eating or drinking well lately due to a GI virus.  She has also been experiencing diarrhea. This morning after taking a shower she bent over to dry her hair and briefly passed out. There is no preceding chest pain, shortness of breath, or  palpitations.  She currently denies lightheadedness or dizziness.   She hasn't been taking her lasix lately and only takes it when she feels volume overloaded.     Past Medical History:  Diagnosis Date  . Asthma   . Bronchitis, asthmatic   . Coronary artery disease    BMS to LAD 02/2008  . Diabetes mellitus without complication ([place])   . Hyperlipidemia   . Hypertension   . Hypertensive heart disease 04/16/2016  . Hypothyroidism   . MI, old   . Morbid obesity ([place])   . Morbid obesity ([place]) 04/16/2016  . S/P coronary artery stent placement 04/16/2016   BMS to LAD 2009  . Tobacco abuse 04/16/2016    Past Surgical History:  Procedure Laterality Date  . ABDOMINAL HYSTERECTOMY    . ANKLE SURGERY    . ANKLE SURGERY Right   . CHOLECYSTECTOMY    . CORONARY ANGIOPLASTY  02/2008   bare metal stent to LAD   . CORONARY STENT PLACEMENT    . KNEE ARTHROSCOPY    . ROTATOR CUFF REPAIR Bilateral   . THORACIC OUTLET SURGERY    . WRIST SURGERY       Current Outpatient Prescriptions  Medication Sig Dispense Refill  . ALPRAZolam (XANAX) 0.5 MG tablet Take 0.5 mg by mouth as needed for anxiety.    Marland Kitchen aspirin 81 MG  tablet Take 81 mg by mouth daily.    Marland Kitchen atorvastatin (LIPITOR) 10 MG tablet Take 1 tablet (10 mg total) by mouth daily. 30 tablet 10  . celecoxib (CELEBREX) 200 MG capsule Take 200 mg by mouth daily.  2  . clopidogrel (PLAVIX) 75 MG tablet Take 75 mg by mouth daily.  2  . Fluticasone-Salmeterol (ADVAIR DISKUS) 250-50 MCG/DOSE AEPB Inhale 1 puff into the lungs 2 (two) times daily.    . furosemide (LASIX) 40 MG tablet Take 40 mg by mouth daily as needed for fluid.    Marland Kitchen gabapentin (NEURONTIN) 300 MG capsule Take 300 mg by mouth 2 (two) times daily.    . INVOKANA 300 MG TABS tablet Take 300 mg by mouth daily.    . isosorbide mononitrate (IMDUR) 60 MG 24 hr tablet Take 1 tablet (60 mg total) by mouth daily. 90 tablet 3  . levothyroxine (SYNTHROID, LEVOTHROID) 125 MCG tablet Take 125 mcg by  mouth daily.    . metFORMIN (GLUCOPHAGE) 500 MG tablet Take 500 mg by mouth daily.  3  . metoprolol succinate (TOPROL-XL) 50 MG 24 hr tablet Take 75 mg by mouth daily. Take with or immediately following a meal.    . nitroGLYCERIN (NITROSTAT) 0.4 MG SL tablet Place 0.4 mg under the tongue every 5 (five) minutes as needed for chest pain.     No current facility-administered medications for this visit.     Allergies:   Codeine; Dilaudid [hydromorphone hcl]; Iodides; Reglan [metoclopramide]; Reglan [metoclopramide]; Tramadol; Dilaudid [hydromorphone hcl]; and Tape    Social History:  The patient  reports that she has been smoking.  She has never used smokeless tobacco. She reports that she does not drink alcohol or use drugs.   Family History:  The patient's family history includes Cancer in her mother.    ROS:  Please see the history of present illness.   Otherwise, review of systems are positive for knee pain.   All other systems are reviewed and negative.    PHYSICAL EXAM: VS:  BP 126/72   Pulse 74   Ht 5\' 5"  (1.651 m)   Wt 129.3 kg (285 lb)   BMI 47.43 kg/m  , BMI Body mass index is 47.43 kg/m. GENERAL:  Well appearing.  Morbidly obese HEENT:  Pupils equal round and reactive, fundi not visualized, oral mucosa unremarkable NECK:  No jugular venous distention, waveform within normal limits, carotid upstroke brisk and symmetric, no bruits LYMPHATICS:  No cervical adenopathy LUNGS:  Clear to auscultation bilaterally HEART:  RRR.  PMI not displaced or sustained,S1 and S2 within normal limits, no S3, no S4, no clicks, no rubs, no murmurs ABD:  Flat, positive bowel sounds normal in frequency in pitch, no bruits, no rebound, no guarding, no midline pulsatile mass, no hepatomegaly, no splenomegaly EXT:  2 plus pulses throughout, no edema, no cyanosis no clubbing SKIN:  No rashes no nodules NEURO:  Cranial nerves II through XII grossly intact, motor grossly intact throughout PSYCH:   Cognitively intact, oriented to person place and time   EKG:  EKG is ordered today. The ekg ordered today demonstrates sinus rhythm rate 74 bpm. LPFB.   Recent Labs: 02/29/2016: ALT 15; B Natriuretic Peptide 38.0; BUN 12; Creatinine, Ser 0.86; Hemoglobin 16.1; Platelets 143; Potassium 4.1; Sodium 141    Lipid Panel No results found for: CHOL, TRIG, HDL, CHOLHDL, VLDL, LDLCALC, LDLDIRECT    Wt Readings from Last 3 Encounters:  10/31/16 129.3 kg (285 lb)  04/16/16  127.6 kg (281 lb 3.2 oz)  02/29/16 99.8 kg (220 lb)      ASSESSMENT AND PLAN:  # CAD s/p BMS to LAD: [name] [name] is doing well and denies any angina.  She has been on DAPT since her stent placement in 2009. It would be reasonable to stop the clopidogrel.  However, given that she has been stable and denies any bleeding, she would prefer to continue it.  Continue Imdur and metoprolol.  It can be held for procedures if necessary.  # Hypertensive heart disease: BP well-controlled.  Continue metoprolol and Imdur.   # Hyperlipidemia: Continue atorvastatin.  Check lipids and CMP today.    # Morbid obesity: [name] [name] was encouraged to start back her exercise when able.    # Tobacco abuse: Patient was encouraged to keep cutting back.   Current medicines are reviewed at length with the patient today.  The patient does not have concerns regarding medicines.  The following changes have been made:  no change  Labs/ tests ordered today include:   Orders Placed This Encounter  Procedures  . Lipid panel  . Comprehensive metabolic panel     Disposition:   FU with [name] Brandel C. Oval Linsey, MD, [facility] in 1 year  This note was written with the assistance of speech recognition software.  Please excuse any transcriptional errors.  Signed, Ayris Carano C. Oval Linsey, MD, [facility]  10/31/2016 1:31 PM    [place]

## 2016-10-31 ENCOUNTER — Other Ambulatory Visit: Payer: Self-pay | Admitting: *Deleted

## 2016-10-31 ENCOUNTER — Ambulatory Visit (INDEPENDENT_AMBULATORY_CARE_PROVIDER_SITE_OTHER): Payer: Medicare Other | Admitting: Cardiovascular Disease

## 2016-10-31 ENCOUNTER — Encounter: Payer: Self-pay | Admitting: Cardiovascular Disease

## 2016-10-31 VITALS — BP 126/72 | HR 74 | Ht 65.0 in | Wt 285.0 lb

## 2016-10-31 DIAGNOSIS — E785 Hyperlipidemia, unspecified: Secondary | ICD-10-CM | POA: Diagnosis not present

## 2016-10-31 DIAGNOSIS — Z79899 Other long term (current) drug therapy: Secondary | ICD-10-CM | POA: Diagnosis not present

## 2016-10-31 DIAGNOSIS — Z9861 Coronary angioplasty status: Secondary | ICD-10-CM | POA: Diagnosis not present

## 2016-10-31 DIAGNOSIS — E049 Nontoxic goiter, unspecified: Secondary | ICD-10-CM | POA: Insufficient documentation

## 2016-10-31 DIAGNOSIS — Z955 Presence of coronary angioplasty implant and graft: Secondary | ICD-10-CM

## 2016-10-31 DIAGNOSIS — I119 Hypertensive heart disease without heart failure: Secondary | ICD-10-CM

## 2016-10-31 DIAGNOSIS — Z720 Tobacco use: Secondary | ICD-10-CM

## 2016-10-31 LAB — LIPID PANEL
Cholesterol: 130 mg/dL (ref <200)
HDL: 43 mg/dL — AB (ref >50)
LDL CALC: 46 mg/dL (ref <100)
Total CHOL/HDL Ratio: 3 Ratio (ref <5.0)
Triglycerides: 203 mg/dL — ABNORMAL HIGH (ref <150)
VLDL: 41 mg/dL — AB (ref <30)

## 2016-10-31 LAB — COMPREHENSIVE METABOLIC PANEL
ALT: 14 U/L (ref 6–29)
AST: 16 U/L (ref 10–35)
Albumin: 3.9 g/dL (ref 3.6–5.1)
Alkaline Phosphatase: 95 U/L (ref 33–130)
BUN: 13 mg/dL (ref 7–25)
CHLORIDE: 103 mmol/L (ref 98–110)
CO2: 29 mmol/L (ref 20–31)
CREATININE: 0.97 mg/dL (ref 0.50–0.99)
Calcium: 9.8 mg/dL (ref 8.6–10.4)
GLUCOSE: 136 mg/dL — AB (ref 65–99)
Potassium: 4.7 mmol/L (ref 3.5–5.3)
SODIUM: 140 mmol/L (ref 135–146)
TOTAL PROTEIN: 7.1 g/dL (ref 6.1–8.1)
Total Bilirubin: 0.9 mg/dL (ref 0.2–1.2)

## 2016-10-31 NOTE — Patient Instructions (Signed)
Medication Instructions:  Your physician recommends that you continue on your current medications as directed. Please refer to the Current Medication list given to you today.  Labwork: LP/CMET AT [facility] ON THE FIRST FLOOR  Testing/Procedures: NONE  Follow-Up: Your physician wants you to follow-up in: 1 [place] will receive a reminder letter in the mail two months in advance. If you don't receive a letter, please call our office to schedule the follow-up appointment.  If you need a refill on your cardiac medications before your next appointment, please call your pharmacy.

## 2016-10-31 NOTE — Addendum Note (Signed)
Addended by: Alvina Filbert B on: 10/31/2016 03:29 PM   Modules accepted: Orders

## 2016-11-03 ENCOUNTER — Telehealth: Payer: Self-pay | Admitting: Cardiovascular Disease

## 2016-11-03 MED ORDER — FUROSEMIDE 40 MG PO TABS
40.0000 mg | ORAL_TABLET | Freq: Every day | ORAL | 6 refills | Status: DC | PRN
Start: 2016-11-03 — End: 2017-12-31

## 2016-11-03 NOTE — Telephone Encounter (Signed)
New message       Pt was seen last Friday.  She said Dr Oval Linsey was going to call in a new presc for furosemide 40mg  to [facility] at [address].  It has not been called in.  Please let her know if Dr Oval Linsey still want her to have this presc.

## 2016-11-03 NOTE — Telephone Encounter (Signed)
Returned call to patient lasix refill sent to pharmacy.

## 2016-11-04 ENCOUNTER — Ambulatory Visit (INDEPENDENT_AMBULATORY_CARE_PROVIDER_SITE_OTHER): Payer: Medicare Other | Admitting: Family Medicine

## 2016-11-04 ENCOUNTER — Encounter: Payer: Self-pay | Admitting: Family Medicine

## 2016-11-04 VITALS — BP 110/66 | HR 73 | Temp 98.3°F | Wt 288.2 lb

## 2016-11-04 DIAGNOSIS — R05 Cough: Secondary | ICD-10-CM | POA: Diagnosis not present

## 2016-11-04 DIAGNOSIS — Z720 Tobacco use: Secondary | ICD-10-CM

## 2016-11-04 DIAGNOSIS — R059 Cough, unspecified: Secondary | ICD-10-CM

## 2016-11-04 MED ORDER — PROMETHAZINE-CODEINE 6.25-10 MG/5ML PO SYRP: 5.0000 mL | ORAL_SOLUTION | Freq: Four times a day (QID) | ORAL | 0 refills | Status: DC | PRN

## 2016-11-04 MED ORDER — PREDNISONE 10 MG PO TABS: 10.0000 mg | ORAL_TABLET | Freq: Two times a day (BID) | ORAL | 0 refills | Status: DC

## 2016-11-04 MED ORDER — AZITHROMYCIN 250 MG PO TABS: ORAL_TABLET | ORAL | 0 refills | Status: DC

## 2016-11-04 NOTE — Progress Notes (Signed)
Pre visit review using our clinic review tool, if applicable. No additional management support is needed unless otherwise documented below in the visit note. 

## 2016-11-04 NOTE — Progress Notes (Signed)
History of Present Illness:    [name] [name] is a 66 y.o. female here for evaluation of a cough. The cough is productive of green/yellow sputum, with wheezing, chest is painful during coughing and is aggravated by reclining position. Onset of symptoms was several days ago, gradually worsening since that time.  Associated symptoms include postnasal drip. Patient does have a history of asthma. Patient has not had recent travel. Patient does have a history of smoking.   PMHx, SurgHx, SocialHx, Medications, and Allergies were reviewed in the Visit Navigator and updated as appropriate.   Past Medical History:  Diagnosis Date  . Asthma   . Bronchitis, asthmatic   . Coronary artery disease    BMS to LAD 02/2008  . Diabetes mellitus without complication ([place])   . Hyperlipidemia   . Hypertension   . Hypertensive heart disease 04/16/2016  . Hypothyroidism   . MI, old   . Morbid obesity ([place])   . Morbid obesity ([place]) 04/16/2016  . S/P coronary artery stent placement 04/16/2016   BMS to LAD 2009  . Tobacco abuse 04/16/2016   Past Surgical History:  Procedure Laterality Date  . ABDOMINAL HYSTERECTOMY    . ANKLE SURGERY    . ANKLE SURGERY Right   . CHOLECYSTECTOMY    . CORONARY ANGIOPLASTY  02/2008   bare metal stent to LAD   . CORONARY STENT PLACEMENT    . KNEE ARTHROSCOPY    . ROTATOR CUFF REPAIR Bilateral   . THORACIC OUTLET SURGERY    . WRIST SURGERY        Family History  Problem Relation Age of Onset  . Cancer Mother    Social History  Substance Use Topics  . Smoking status: Light Tobacco Smoker  . Smokeless tobacco: Never Used  . Alcohol use No     Allergies  Allergen Reactions  . Codeine Hives  . Dilaudid [Hydromorphone Hcl] Nausea And Vomiting  . Iodides   . Reglan [Metoclopramide] Other (See Comments)    Other reaction(s): Dystonia Hallucinations/Violent   . Reglan [Metoclopramide]   . Tramadol   . Dilaudid [Hydromorphone Hcl] Nausea And Vomiting  . Tape Rash   Blisters     Prior to Admission medications   Medication Sig Start Date End Date Taking? Authorizing Provider  ALPRAZolam Duanne Moron) 0.5 MG tablet Take 0.5 mg by mouth as needed for anxiety.   Yes Historical Provider, MD  aspirin 81 MG tablet Take 81 mg by mouth daily.   Yes Historical Provider, MD  atorvastatin (LIPITOR) 10 MG tablet Take 1 tablet (10 mg total) by mouth daily. 05/27/16  Yes Troy Sine, MD  celecoxib (CELEBREX) 200 MG capsule Take 200 mg by mouth daily. 02/22/16  Yes Historical Provider, MD  clopidogrel (PLAVIX) 75 MG tablet Take 75 mg by mouth daily. 01/10/16  Yes Historical Provider, MD  Fluticasone-Salmeterol (ADVAIR DISKUS) 250-50 MCG/DOSE AEPB Inhale 1 puff into the lungs 2 (two) times daily.   Yes Historical Provider, MD  furosemide (LASIX) 40 MG tablet Take 1 tablet (40 mg total) by mouth daily as needed for fluid. 11/03/16  Yes Skeet Latch, MD  gabapentin (NEURONTIN) 300 MG capsule Take 300 mg by mouth 2 (two) times daily. 10/01/16  Yes Historical Provider, MD  INVOKANA 300 MG TABS tablet Take 300 mg by mouth daily. 10/11/16  Yes Historical Provider, MD  isosorbide mononitrate (IMDUR) 60 MG 24 hr tablet Take 1 tablet (60 mg total) by mouth daily. 06/02/16  Yes Skeet Latch, MD  levothyroxine (SYNTHROID, LEVOTHROID) 125 MCG tablet Take 125 mcg by mouth daily. 10/21/16  Yes Historical Provider, MD  metFORMIN (GLUCOPHAGE) 500 MG tablet Take 500 mg by mouth daily. 12/12/15  Yes Historical Provider, MD  metoprolol succinate (TOPROL-XL) 50 MG 24 hr tablet Take 75 mg by mouth daily. Take with or immediately following a meal.   Yes Historical Provider, MD  nitroGLYCERIN (NITROSTAT) 0.4 MG SL tablet Place 0.4 mg under the tongue every 5 (five) minutes as needed for chest pain.   Yes Historical Provider, MD     Review of Systems:   No unusual headaches, no dizziness. No dyspnea or chest pain on exertion. No abdominal pain, change in bowel habits, black or bloody stools.  No  urinary tract symptoms. No new or unusual musculoskeletal symptoms. No edema.   Vitals:   11/04/16 1050  BP: 110/66  Pulse: 73  Temp: 98.3 F (36.8 C)  TempSrc: Oral  SpO2: 96%  Weight: 288 lb 3.2 oz (130.7 kg)     Body mass index is 47.96 kg/m.   Physical Exam:   General appearance: alert, cooperative and appears stated age 53: extra ocular movement intact and PND Lungs: central lung congestion, diffuse wheeze bilaterally, no increased WOB Heart: regular rate and rhythm Abdomen: soft, non-tender; bowel sounds normal; no masses,  no organomegaly Extremities: extremities normal, atraumatic, no cyanosis or edema Pulses: 2+ and symmetric Skin: Skin color, texture, turgor normal. No rashes or lesions Neurologic: Grossly normal  Assessment and Plan:    [name] [name] was seen today for cough and sinus problem.  Diagnoses and all orders for this visit:  Cough -     azithromycin (ZITHROMAX Z-PAK) 250 MG tablet; Take 2 tablets ( total of 500 mg) PO on day 1, then 1 tablet ( total of 250 mg) PO q24 x 4 days. -     predniSONE (DELTASONE) 10 MG tablet; Take 1 tablet (10 mg total) by mouth 2 (two) times daily with a meal. -     promethazine-codeine (PHENERGAN WITH CODEINE) 6.25-10 MG/5ML syrup; Take 5 mLs by mouth every 6 (six) hours as needed for cough.  Tobacco abuse     . Reviewed expectations re: course of current medical issues. . Discussed self-management of symptoms. . Outlined signs and symptoms indicating need for more acute intervention. . Patient verbalized understanding and all questions were answered. . See orders for this visit as documented in the electronic medical record. . Patient received an After Visit Summary.   Briscoe Deutscher, D.O.

## 2016-11-05 ENCOUNTER — Ambulatory Visit: Payer: Medicare Other | Admitting: Family Medicine

## 2016-11-05 ENCOUNTER — Ambulatory Visit: Payer: Self-pay | Admitting: Family Medicine

## 2016-11-07 ENCOUNTER — Encounter: Payer: Self-pay | Admitting: Surgical

## 2016-11-07 ENCOUNTER — Ambulatory Visit (INDEPENDENT_AMBULATORY_CARE_PROVIDER_SITE_OTHER): Payer: Medicare Other | Admitting: Family Medicine

## 2016-11-07 ENCOUNTER — Other Ambulatory Visit: Payer: Self-pay | Admitting: Family Medicine

## 2016-11-07 ENCOUNTER — Encounter: Payer: Self-pay | Admitting: Family Medicine

## 2016-11-07 VITALS — BP 112/68 | HR 75 | Temp 98.2°F | Ht 65.0 in | Wt 287.4 lb

## 2016-11-07 DIAGNOSIS — Z114 Encounter for screening for human immunodeficiency virus [HIV]: Secondary | ICD-10-CM | POA: Diagnosis not present

## 2016-11-07 DIAGNOSIS — F430 Acute stress reaction: Secondary | ICD-10-CM

## 2016-11-07 DIAGNOSIS — J4540 Moderate persistent asthma, uncomplicated: Secondary | ICD-10-CM

## 2016-11-07 DIAGNOSIS — E039 Hypothyroidism, unspecified: Secondary | ICD-10-CM | POA: Diagnosis not present

## 2016-11-07 DIAGNOSIS — I25118 Atherosclerotic heart disease of native coronary artery with other forms of angina pectoris: Secondary | ICD-10-CM | POA: Diagnosis not present

## 2016-11-07 DIAGNOSIS — Z1231 Encounter for screening mammogram for malignant neoplasm of breast: Secondary | ICD-10-CM

## 2016-11-07 DIAGNOSIS — I252 Old myocardial infarction: Secondary | ICD-10-CM | POA: Diagnosis not present

## 2016-11-07 DIAGNOSIS — Z1211 Encounter for screening for malignant neoplasm of colon: Secondary | ICD-10-CM

## 2016-11-07 DIAGNOSIS — E119 Type 2 diabetes mellitus without complications: Secondary | ICD-10-CM

## 2016-11-07 DIAGNOSIS — Z1239 Encounter for other screening for malignant neoplasm of breast: Secondary | ICD-10-CM

## 2016-11-07 DIAGNOSIS — J449 Chronic obstructive pulmonary disease, unspecified: Secondary | ICD-10-CM | POA: Diagnosis not present

## 2016-11-07 DIAGNOSIS — Z1159 Encounter for screening for other viral diseases: Secondary | ICD-10-CM

## 2016-11-07 DIAGNOSIS — F411 Generalized anxiety disorder: Secondary | ICD-10-CM

## 2016-11-07 DIAGNOSIS — I25708 Atherosclerosis of coronary artery bypass graft(s), unspecified, with other forms of angina pectoris: Secondary | ICD-10-CM | POA: Diagnosis not present

## 2016-11-07 MED ORDER — PREDNISONE 20 MG PO TABS: 20.0000 mg | ORAL_TABLET | Freq: Two times a day (BID) | ORAL | 0 refills | Status: AC

## 2016-11-07 MED ORDER — FLUTICASONE-SALMETEROL 250-50 MCG/DOSE IN AEPB: 1.0000 | INHALATION_SPRAY | Freq: Two times a day (BID) | RESPIRATORY_TRACT | 2 refills | Status: DC

## 2016-11-07 MED ORDER — NITROGLYCERIN 0.4 MG SL SUBL: 0.4000 mg | SUBLINGUAL_TABLET | SUBLINGUAL | 1 refills | Status: DC | PRN

## 2016-11-07 MED ORDER — ALPRAZOLAM 0.5 MG PO TABS: 0.5000 mg | ORAL_TABLET | Freq: Two times a day (BID) | ORAL | 0 refills | Status: DC | PRN

## 2016-11-07 NOTE — Progress Notes (Signed)
Pre visit review using our clinic review tool, if applicable. No additional management support is needed unless otherwise documented below in the visit note. 

## 2016-11-08 ENCOUNTER — Encounter: Payer: Self-pay | Admitting: Family Medicine

## 2016-11-08 DIAGNOSIS — E1165 Type 2 diabetes mellitus with hyperglycemia: Secondary | ICD-10-CM | POA: Insufficient documentation

## 2016-11-08 DIAGNOSIS — E118 Type 2 diabetes mellitus with unspecified complications: Secondary | ICD-10-CM | POA: Insufficient documentation

## 2016-11-08 DIAGNOSIS — J45909 Unspecified asthma, uncomplicated: Secondary | ICD-10-CM | POA: Insufficient documentation

## 2016-11-08 DIAGNOSIS — E039 Hypothyroidism, unspecified: Secondary | ICD-10-CM | POA: Insufficient documentation

## 2016-11-08 DIAGNOSIS — I2510 Atherosclerotic heart disease of native coronary artery without angina pectoris: Secondary | ICD-10-CM | POA: Insufficient documentation

## 2016-11-08 DIAGNOSIS — I252 Old myocardial infarction: Secondary | ICD-10-CM | POA: Insufficient documentation

## 2016-11-08 NOTE — Progress Notes (Signed)
[name] [name] is a 66 y.o. female is here to [employer].   History of Present Illness:    1. Anxiety as acute reaction to exceptional stress. 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 Patient uses small amount of Xanax sparingly. Asking for refill today. Psychological ROS: negative for - hallucinations, memory difficulties or suicidal ideation.   2. Diabetes mellitus without complication ([place]). Followed by Endocrinology. No labs available. Has lost weight and controlled blood sugars using Invokana. Endocrine ROS: negative for - malaise/lethargy, palpitations, polydipsia/polyuria, skin changes or unexpected weight changes.   3. Coronary artery disease of native heart with stable angina pectoris, unspecified vessel or lesion type ([place]). Cardiovascular ROS: negative for - chest pain, dyspnea on exertion, edema, loss of consciousness, orthopnea, palpitations, paroxysmal nocturnal dyspnea or shortness of breath.   4. Chronic obstructive pulmonary disease, unspecified COPD type ([place]). Still smoking. Not ready to quit. Recent asthmatic bronchitis exacerbation, resolving. See previous visit.   5. Screening for breast cancer   6. Screening for colon cancer   7. Need for hepatitis C screening test   8. Screening for HIV (human immunodeficiency virus)    9. Coronary artery disease of bypass graft of native heart with stable angina pectoris ([place]). Needs refill of Nitroglycerin.   10. Acquired hypothyroidism  11. Moderate persistent asthmatic bronchitis without complication   12. Morbid obesity ([place])   13. MI, old     Health Maintenance Due  Topic Date Due  . HEMOGLOBIN A1C  12-Sep-1951  . Hepatitis C Screening  10/26/1952  . URINE MICROALBUMIN  06/17/1963  . HIV Screening  06/16/1968  . MAMMOGRAM  06/17/2003    PMHx, SurgHx, SocialHx, Medications, and Allergies were reviewed in the Visit Navigator and updated as appropriate.    Past Medical History:  Diagnosis Date  .  Bronchitis, asthmatic   . Coronary artery disease    BMS to LAD 02/2008  . Diabetes mellitus without complication ([place])   . Hyperlipidemia   . Hypertensive heart disease 04/16/2016  . Hypothyroidism   . MI, old   . Morbid obesity ([place]) 04/16/2016  . S/P coronary artery stent placement 04/16/2016   BMS to LAD 2009  . Tobacco abuse 04/16/2016    Past Surgical History:  Procedure Laterality Date  . ABDOMINAL HYSTERECTOMY    . ANKLE SURGERY Right   . CHOLECYSTECTOMY    . CORONARY ANGIOPLASTY  02/2008   bare metal stent to LAD   . CORONARY STENT PLACEMENT    . KNEE ARTHROSCOPY    . ROTATOR CUFF REPAIR Bilateral   . THORACIC OUTLET SURGERY    . WRIST SURGERY      Family History  Problem Relation Age of Onset  . Cancer Mother   . Diabetes Son   . Hypertension Son     Social History  Substance Use Topics  . Smoking status: Light Tobacco Smoker  . Smokeless tobacco: Never Used     Comment: 5-6 cigarettes per day  . Alcohol use No     Current Medications and Allergies:    Current Outpatient Prescriptions:  .  ALPRAZolam (XANAX) 0.5 MG tablet, Take 1 tablet (0.5 mg total) by mouth 2 (two) times daily as needed for anxiety., Disp: 30 tablet, Rfl: 0 .  aspirin 81 MG tablet, Take 81 mg by mouth daily., Disp: , Rfl:  .  atorvastatin (LIPITOR) 10 MG tablet, Take 1 tablet (10 mg total) by mouth daily., Disp: 30 tablet, Rfl: 10 .  azithromycin (ZITHROMAX Z-PAK) 250 MG tablet, Take 2 tablets ( total of 500 mg) PO on day 1, then 1 tablet ( total of 250 mg) PO q24 x 4 days., Disp: 6 each, Rfl: 0 .  celecoxib (CELEBREX) 200 MG capsule, Take 200 mg by mouth daily., Disp: , Rfl: 2 .  clopidogrel (PLAVIX) 75 MG tablet, Take 75 mg by mouth daily., Disp: , Rfl: 2 .  Fluticasone-Salmeterol (ADVAIR DISKUS) 250-50 MCG/DOSE AEPB, Inhale 1 puff into the lungs 2 (two) times daily., Disp: 60 each, Rfl: 2 .  furosemide (LASIX) 40 MG tablet, Take 1 tablet (40 mg total) by mouth daily as needed for  fluid., Disp: 30 tablet, Rfl: 6 .  gabapentin (NEURONTIN) 300 MG capsule, Take 300 mg by mouth 2 (two) times daily., Disp: , Rfl:  .  INVOKANA 300 MG TABS tablet, Take 300 mg by mouth daily., Disp: , Rfl:  .  isosorbide mononitrate (IMDUR) 60 MG 24 hr tablet, Take 1 tablet (60 mg total) by mouth daily., Disp: 90 tablet, Rfl: 3 .  levothyroxine (SYNTHROID, LEVOTHROID) 125 MCG tablet, Take 125 mcg by mouth daily., Disp: , Rfl:  .  metFORMIN (GLUCOPHAGE) 500 MG tablet, Take 500 mg by mouth daily., Disp: , Rfl: 3 .  metoprolol succinate (TOPROL-XL) 50 MG 24 hr tablet, Take 75 mg by mouth daily. Take with or immediately following a meal., Disp: , Rfl:  .  nitroGLYCERIN (NITROSTAT) 0.4 MG SL tablet, Place 1 tablet (0.4 mg total) under the tongue every 5 (five) minutes as needed for chest pain., Disp: 20 tablet, Rfl: 1 .  promethazine-codeine (PHENERGAN WITH CODEINE) 6.25-10 MG/5ML syrup, Take 5 mLs by mouth every 6 (six) hours as needed for cough., Disp: 240 mL, Rfl: 0 .  predniSONE (DELTASONE) 20 MG tablet, Take 1 tablet (20 mg total) by mouth 2 (two) times daily with a meal., Disp: 6 tablet, Rfl: 0   Allergies  Allergen Reactions  . Codeine Hives  . Dilaudid [Hydromorphone Hcl] Nausea And Vomiting  . Iodides   . Reglan [Metoclopramide] Other (See Comments)    Other reaction(s): Dystonia Hallucinations/Violent   . Reglan [Metoclopramide]   . Tramadol   . Dilaudid [Hydromorphone Hcl] Nausea And Vomiting  . Tape Rash    Blisters       Patient Information Form: Screening and ROS    Do you feel safe in relationships? yes PHQ-2: negative  Review of Systems  General:  Negative for unexplained weight loss, fever Skin: Negative for new or changing mole, sore that won't heal HEENT: Negative for trouble hearing, trouble seeing, ringing in ears, mouth sores, hoarseness, change in voice, dysphagia CV:  Negative for chest pain, dyspnea, edema, palpitations Resp: Negative for dyspnea,  hemoptysis GI: Negative for nausea, vomiting, diarrhea, constipation, abdominal pain, melena, hematochezia GU: Negative for dysuria, incontinence, urinary hesitance, hematuria, vaginal or penile discharge, polyuria MSK: Negative for muscle cramps or aches, joint pain or swelling Neuro: Negative for headaches, weakness, numbness, dizziness, passing out/fainting Psych: Negative for depression, memory problems   Vitals:   Vitals:   11/07/16 1404  BP: 112/68  Pulse: 75  Temp: 98.2 F (36.8 C)  TempSrc: Oral  SpO2: 94%  Weight: 287 lb 6.4 oz (130.4 kg)  Height: 5\' 5"  (1.651 m)     Body mass index is 47.83 kg/m.   Physical Exam:     General: Alert, cooperative, appears stated age and no distress.  HEENT:  Normocephalic, without obvious abnormality, atraumatic. Conjunctivae/corneas clear. PERRL, EOM's  intact. Normal TM's and external ear canals both ears. Nares normal. Septum midline. Mucosa normal. No drainage or sinus tenderness. Lips, mucosa, and tongue normal; teeth and gums normal.  Lungs: Clear to auscultation bilaterally.  Heart:: Regular rate and rhythm, S1, S2 normal, no murmur, click, rub or gallop.  Abdomen: Soft, non-tender; bowel sounds normal; no masses,  no organomegaly.  Extremities: Extremities normal, atraumatic, no cyanosis or edema.  Pulses: 2+ and symmetric.  Skin: Skin color, texture, turgor normal. No rashes or lesions.  Neurologic: Alert and oriented X 3, normal strength and tone. Normal symmetric. reflexes. Normal coordination and gait.  Psych: Alert,oriented, in NAD with a full range of affect, normal behavior and no psychotic features      Assessment and Plan:    [name] [name] was seen today for establish care.  Diagnoses and all orders for this visit:  Anxiety as acute reaction to exceptional stress -     ALPRAZolam (XANAX) 0.5 MG tablet; Take 1 tablet (0.5 mg total) by mouth 2 (two) times daily as needed for anxiety. - Controlled substance contract  signed.  Diabetes mellitus without complication ([place]) -     CBC; Future -     Comprehensive metabolic panel; Future -     Hemoglobin A1c; Future -     Microalbumin / creatinine urine ratio; Future  Coronary artery disease of native heart with stable angina pectoris, unspecified vessel or lesion type (HCC) -     nitroGLYCERIN (NITROSTAT) 0.4 MG SL tablet; Place 1 tablet (0.4 mg total) under the tongue every 5 (five) minutes as needed for chest pain. -     Lipid panel; Future  Chronic obstructive pulmonary disease, unspecified COPD type (HCC) -     Fluticasone-Salmeterol (ADVAIR DISKUS) 250-50 MCG/DOSE AEPB; Inhale 1 puff into the lungs 2 (two) times daily.  Screening for breast cancer -     MM SCREENING BREAST TOMO BILATERAL; Future  Screening for colon cancer -     HM COLONOSCOPY  Need for hepatitis C screening test -     Hepatitis C antibody; Future  Screening for HIV (human immunodeficiency virus) -     HIV antibody; Future  Coronary artery disease of bypass graft of native heart with stable angina pectoris (HCC)  Acquired hypothyroidism -     TSH; Future -     T4, free; Future  Moderate persistent asthmatic bronchitis without complication  Morbid obesity (HCC)  MI, old   . Reviewed expectations re: course of current medical issues. . Discussed self-management of symptoms. . Outlined signs and symptoms indicating need for more acute intervention. . Patient verbalized understanding and all questions were answered. . See orders for this visit as documented in the electronic medical record. . Patient received an After Visit Summary.  Records requested if needed. I spent 60 minutes with this patient, greater than 50% was face-to-face time counseling regarding the above diagnoses.    Briscoe Deutscher, [place], [place] 11/08/2016   Follow-up: No Follow-up on file.  Meds ordered this encounter  Medications  . ALPRAZolam (XANAX) 0.5 MG tablet    Sig:  Take 1 tablet (0.5 mg total) by mouth 2 (two) times daily as needed for anxiety.    Dispense:  30 tablet    Refill:  0  . Fluticasone-Salmeterol (ADVAIR DISKUS) 250-50 MCG/DOSE AEPB    Sig: Inhale 1 puff into the lungs 2 (two) times daily.    Dispense:  60 each    Refill:  2  .  nitroGLYCERIN (NITROSTAT) 0.4 MG SL tablet    Sig: Place 1 tablet (0.4 mg total) under the tongue every 5 (five) minutes as needed for chest pain.    Dispense:  20 tablet    Refill:  1  . predniSONE (DELTASONE) 20 MG tablet    Sig: Take 1 tablet (20 mg total) by mouth 2 (two) times daily with a meal.    Dispense:  6 tablet    Refill:  0   Medications Discontinued During This Encounter  Medication Reason  . predniSONE (DELTASONE) 10 MG tablet Error  . ALPRAZolam (XANAX) 0.5 MG tablet Reorder  . Fluticasone-Salmeterol (ADVAIR DISKUS) 250-50 MCG/DOSE AEPB Reorder  . nitroGLYCERIN (NITROSTAT) 0.4 MG SL tablet Reorder   Orders Placed This Encounter  Procedures  . MM SCREENING BREAST TOMO BILATERAL  . CBC  . Comprehensive metabolic panel  . Hemoglobin A1c  . Lipid panel  . Microalbumin / creatinine urine ratio  . Hepatitis C antibody  . HIV antibody  . TSH  . T4, free  . HM COLONOSCOPY

## 2016-11-27 ENCOUNTER — Ambulatory Visit: Payer: Medicare Other

## 2016-11-27 ENCOUNTER — Encounter: Payer: Self-pay | Admitting: Family Medicine

## 2016-11-27 ENCOUNTER — Ambulatory Visit (INDEPENDENT_AMBULATORY_CARE_PROVIDER_SITE_OTHER): Payer: Medicare Other | Admitting: Family Medicine

## 2016-11-27 ENCOUNTER — Telehealth: Payer: Self-pay | Admitting: Family Medicine

## 2016-11-27 VITALS — BP 128/78 | HR 76 | Temp 98.6°F | Ht 60.0 in | Wt 287.0 lb

## 2016-11-27 DIAGNOSIS — Z720 Tobacco use: Secondary | ICD-10-CM | POA: Diagnosis not present

## 2016-11-27 DIAGNOSIS — J441 Chronic obstructive pulmonary disease with (acute) exacerbation: Secondary | ICD-10-CM

## 2016-11-27 DIAGNOSIS — I25708 Atherosclerosis of coronary artery bypass graft(s), unspecified, with other forms of angina pectoris: Secondary | ICD-10-CM

## 2016-11-27 DIAGNOSIS — E119 Type 2 diabetes mellitus without complications: Secondary | ICD-10-CM | POA: Diagnosis not present

## 2016-11-27 MED ORDER — PROMETHAZINE-CODEINE 6.25-10 MG/5ML PO SYRP: 5.0000 mL | ORAL_SOLUTION | Freq: Four times a day (QID) | ORAL | 0 refills | Status: DC | PRN

## 2016-11-27 MED ORDER — AZITHROMYCIN 250 MG PO TABS: ORAL_TABLET | ORAL | 0 refills | Status: DC

## 2016-11-27 NOTE — Patient Instructions (Signed)
Community Resources  Advocacy/Legal Legal Aid [place]:  1-866-219-5262  /  336-272-0148  Family Justice Center:  336-641-7233  [facility] [place] 24-hr Crisis line:  336-273-7273  Women's Resource Center, [place]:  336-275-6090  Court Watch (custody):  336-275-2346  [place] [facility]:   336-279-9299    Baby & Breastfeeding Car Seat Inspection @ Various [place] Fire Depts.- call 336-373-2177  [place] [facility]act[facility]  336-832-6860  [facility] 336-878-6712  WIC: 336-641-3663 ([place]);  336-641-7571 ([place])  La Leche League:  1-877-452-5321   Childcare [place] [facility]: 336-369-5097 ([place]) / 336-887-8224 ([place])  - Child Care Resources/ Referrals/ Scholarships  - Head Start/ Early Head Start (call or apply online)  [place] DHHS: [place] Pre-K :  1-800-859-0829 / 336-274-5437   Employment / Job Search [facility] [place]: 336-275-6090 / [address]  [place] [facility] [employer] Center (JobLink): 336-373-5922 ([place]) / 336-882-4141 ([place])  [facility] Community Resource/ Career Center: 336-275-9801 / 336-282-7307  [facility] Job & Career Center: 336-373-3764  DHHS Work First: 336-641-3447 ([place]) / 336-641-3447 ([place])  [facility] [place]:  336-676-5871   Financial Assistance [facility]:  336-553-2657  [employer]: 336-235-0368  [employer] (furniture):  336-370-4002  [place] [facility]: 336-373-4264  Low Income Energy Assistance  336-641-3000   Food Assistance DHHS- SNAP/ Food Stamps: 336-641-4588  WIC: [place]- 336-641-3663 ;  [place] 336-641-7571  Little Green Book- Free Meals  Little Blue Book- Free Food Pantries  During the summer, text "FOOD" to 877877   General Health / Clinics (Adults) Orange Card (for Adults) through [facility]: (336) 895-4900  [facility]:   336-832-8035  [facility]:   336-832-4444  Health Department:  336-641-3245  [facility]:  336-415-3877 / 336-641-2100  [facility] of [place]:   336-373-0678  [facility]:   336-334-4822 x 50251   Housing [place] Housing Coalition:   336-691-9521  [place] Housing Authority:  336-275-8501  Affordable Housing Managemnt:  336-273-0568   Immigrant/ Refugee Center for [place]ians (UNCG):  336-256-1065  [facility]:  336-379-0037  [employer]:  336-937-4701  [employer] Services:  336-617-0381  African Services Coalition:  336-574-2677   LGBTQ YouthSAFE  www.youthsafe[place].org  PFLAG  336-541-6754 / info@[place].org  The Trevor Project:  1-866-488-7386   Mental Health/ Substance Use [facility]  336-387-6161  [facility]:  336-832-9700 or 1-800-711-2635  [facility] of Care:  336-271-5888  [facility]:  336-294-1349  [facility]:  336-542-2884  [facility] (walk-ins)  336-676-6840 / [address]  [place]:  800-449-1287  Alcoholics Anonymous:  336-854-4278  Narcotics Anonymous:  800-365-1036  Quit Smoking Hotline:  800-QUIT-NOW (800-784-8669)   Parenting Children's Home Society:  800-632-1400  [place]: Education Center & Support Groups:  336-832-6682  YWCA: 336-273-3461  [employer]CG: Bringing Out the Best:  336-334-3120               Thriving at Three (Hispanic families): 336-256-1066  Healthy Start ([facility]):  336-387-6161 x2288  Parents as Teachers:  336-691-0024  [place] [facility]- Learning Together (Immigrants): 336-369-5001   Poison Control 800-222-1222  Sports & Recreation [employer] Open Doors Application: ymcanwnc.org/join/open-doors-financial-assistance/  City of [place] Recreation Centers: http://www.[place].gov/index.aspx?page=3615   Special Needs Family Support Network:  336-832-6507  Autism Society of [place]:   336-333-0197 x1402 or x1412 /  800-785-1035  TEACCH [place]:  336-334-5773     ARC of [place]:  336-373-1076  Children's Developmental Service Agency (CDSA):  336-334-5601  CC4C (Care Coordination for Children):  336-641-7641   Transportation Medicaid Transportation: 336-641-4848 to apply  [place] Transit Authority: 336-335-6499 (reduced-fare bus ID to Medicaid/ Medicare/ Orange Card)  SCAT Paratransit services: Eligible riders only, call 336-333-6589 for application   Tutoring/Mentoring [facility] Child Development Institute: 336-230-2138  Big Brothers/ Big Sisters: 336-378-9100 ([place])  336-882-4167 ([place])  ACES through child's school: 336-370-2321  [employer] Achievers: contact your local Y  SHIELD Mentor Program: 336-337-2771   

## 2016-11-27 NOTE — Telephone Encounter (Signed)
Spoke with patient and since she got better then symptoms came back have scheduled her to come in for visit today.

## 2016-11-27 NOTE — Telephone Encounter (Signed)
Patient is asking for another round of z pack and phenergan to be called in to the pharmacy on file. She states that her sinuses are killing her, just as they were at her feb visit.

## 2016-11-27 NOTE — Progress Notes (Signed)
Pre visit review using our clinic review tool, if applicable. No additional management support is needed unless otherwise documented below in the visit note. 

## 2016-11-27 NOTE — Progress Notes (Signed)
[name] [name] is a 66 y.o. female here for a recurrence of a previously resolved problem.  History of Present Illness:   [name] [name] is a 66 y.o. female follow up for evaluation and treatment of COPD. The patient has had symptoms for 2 days. Her symptoms currently include:  cough, wheezing, chills and increased sputum. Therapies that have been tried JEH:UDJSHFWYOV/ZCHYIFOYDXA inhaler. Patient did not respond to therapies. Adherence to the recommended therapy has been good. Symptoms are unchanged. Last exacerbation was 4 weeks ago. Number of exacerbation this year: 2. Patient currently is not on home oxygen therapy.. The patient is having no constitutional symptoms, denying fever, chills, anorexia, or weight loss..The patient is experiencing exercise intolerance, but is thought to be due to deconditioning.   Smoking status: 5-6 cigarettes per day  This patient is currently having an exacerbation. Cough can be characterized by: productive of green/yellow sputum, with wheezing, chest is painful during coughing. Symptoms have not been responsive to albuterol inhaler and salmeterol/fluticasone inhaler.    PMHx, SurgHx, SocialHx, Medications, and Allergies were reviewed in the Visit Navigator and updated as appropriate.  Current Medications:   .  ALPRAZolam (XANAX) 0.5 MG tablet, Take 1 tablet (0.5 mg total) by mouth 2 (two) times daily as needed for anxiety., Disp: 30 tablet, Rfl: 0 .  aspirin 81 MG tablet, Take 81 mg by mouth daily., Disp: , Rfl:  .  atorvastatin (LIPITOR) 10 MG tablet, Take 1 tablet (10 mg total) by mouth daily., Disp: 30 tablet, Rfl: 10 .  celecoxib (CELEBREX) 200 MG capsule, Take 200 mg by mouth daily., Disp: , Rfl: 2 .  clopidogrel (PLAVIX) 75 MG tablet, Take 75 mg by mouth daily., Disp: , Rfl: 2 .  Fluticasone-Salmeterol (ADVAIR DISKUS) 250-50 MCG/DOSE AEPB, Inhale 1 puff into the lungs 2 (two) times daily., Disp: 60 each, Rfl: 2 .  furosemide (LASIX) 40 MG tablet, Take 1 tablet (40  mg total) by mouth daily as needed for fluid., Disp: 30 tablet, Rfl: 6 .  gabapentin (NEURONTIN) 300 MG capsule, Take 300 mg by mouth 2 (two) times daily., Disp: , Rfl:  .  INVOKANA 300 MG TABS tablet, Take 300 mg by mouth daily., Disp: , Rfl:  .  isosorbide mononitrate (IMDUR) 60 MG 24 hr tablet, Take 1 tablet (60 mg total) by mouth daily., Disp: 90 tablet, Rfl: 3 .  levothyroxine (SYNTHROID, LEVOTHROID) 125 MCG tablet, Take 125 mcg by mouth daily., Disp: , Rfl:  .  metFORMIN (GLUCOPHAGE) 500 MG tablet, Take 500 mg by mouth daily., Disp: , Rfl: 3 .  metoprolol succinate (TOPROL-XL) 50 MG 24 hr tablet, Take 75 mg by mouth daily. Take with or immediately following a meal., Disp: , Rfl:  .  nitroGLYCERIN (NITROSTAT) 0.4 MG SL tablet, Place 1 tablet (0.4 mg total) under the tongue every 5 (five) minutes as needed for chest pain., Disp: 20 tablet, Rfl: 1   Review of Systems:   Review of Systems  Constitutional: Negative.   HENT: Positive for sinus pain.   Eyes: Negative.   Respiratory: Positive for cough and shortness of breath.   Cardiovascular: Negative.   Gastrointestinal: Negative.   Genitourinary: Negative.   Musculoskeletal: Negative.   Skin: Negative.   Neurological: Negative.     Vitals:   Vitals:   11/27/16 1533  BP: 128/78  Pulse: 76  Temp: 98.6 F (37 C)  TempSrc: Oral  SpO2: 94%  Weight: 287 lb (130.2 kg)  Height: 5' (1.524 m)  Body mass index is 56.05 kg/m.  Physical Exam:   General: Alert, cooperative, appears stated age and no distress.  HEENT:  Normocephalic, without obvious abnormality, atraumatic. Conjunctivae/corneas clear. PERRL, EOM's intact. Normal TM's and external ear canals both ears. Nares normal. Septum midline. Mucosa normal. No drainage or sinus tenderness. Lips, mucosa, and tongue normal; teeth and gums normal.  Lungs: Decreased breath sounds bilaterally. Cough. No increased WOB.  Heart:: Regular rate and rhythm, S1, S2 normal, no murmur,  click, rub or gallop.  Abdomen: Soft, non-tender; bowel sounds normal; no masses,  no organomegaly.  Extremities: Extremities normal, atraumatic, no cyanosis or edema.  Pulses: 2+ and symmetric.  Skin: Skin color, texture, turgor normal. No rashes or lesions.  Neurologic: Alert and oriented X 3, normal strength and tone. Normal symmetric. reflexes. Normal coordination and gait.  Psych: Alert,oriented, in NAD with a full range of affect, normal behavior and no psychotic features    Assessment and Plan:   [name] [name] was seen today for acute visit, cough, sinus pressure and chest tighthness.  Diagnoses and all orders for this visit:  COPD exacerbation ([place]) Comments: Patient already has prednisone from previous Rx. Will add below. Orders: -     azithromycin (ZITHROMAX Z-PAK) 250 MG tablet; Take 2 tablets ( total of 500 mg) PO on day 1, then 1 tablet ( total of 250 mg) PO q24 x 4 days. -     promethazine-codeine (PHENERGAN WITH CODEINE) 6.25-10 MG/5ML syrup; Take 5 mLs by mouth every 6 (six) hours as needed for cough.  Diabetes mellitus without complication ([place]) Comments: Labs drawn today. She was unable to give urine, so will provide at another time.  Orders: -     CBC with Differential/Platelet -     Comprehensive metabolic panel -     Lipid panel -     Hemoglobin A1c -     Microalbumin / creatinine urine ratio; Future  Tobacco abuse Comments: Not ready to quit.   . Reviewed expectations re: course of current medical issues. . Discussed self-management of symptoms. . Outlined signs and symptoms indicating need for more acute intervention. . Patient verbalized understanding and all questions were answered. . See orders for this visit as documented in the electronic medical record. . Patient received an After-Visit Summary.   Briscoe Deutscher, D.O.

## 2016-11-28 ENCOUNTER — Telehealth: Payer: Self-pay | Admitting: Surgical

## 2016-11-28 LAB — COMPREHENSIVE METABOLIC PANEL
ALT: 24 U/L (ref 0–35)
AST: 20 U/L (ref 0–37)
Albumin: 4 g/dL (ref 3.5–5.2)
Alkaline Phosphatase: 99 U/L (ref 39–117)
BUN: 16 mg/dL (ref 6–23)
CO2: 34 mEq/L — ABNORMAL HIGH (ref 19–32)
Calcium: 10.1 mg/dL (ref 8.4–10.5)
Chloride: 101 mEq/L (ref 96–112)
Creatinine, Ser: 0.91 mg/dL (ref 0.40–1.20)
GFR: 66.27 mL/min (ref >60.00)
Glucose, Bld: 110 mg/dL — ABNORMAL HIGH (ref 70–99)
Potassium: 4.7 mEq/L (ref 3.5–5.1)
Sodium: 139 mEq/L (ref 135–145)
Total Bilirubin: 0.7 mg/dL (ref 0.2–1.2)
Total Protein: 7.5 g/dL (ref 6.0–8.3)

## 2016-11-28 LAB — CBC WITH DIFFERENTIAL/PLATELET
Basophils Absolute: 0.1 10*3/uL (ref 0.0–0.1)
Basophils Relative: 0.9 % (ref 0.0–3.0)
Eosinophils Absolute: 0.2 10*3/uL (ref 0.0–0.7)
Eosinophils Relative: 2 % (ref 0.0–5.0)
HCT: 54.2 % — ABNORMAL HIGH (ref 36.0–46.0)
Hemoglobin: 18 g/dL (ref 12.0–15.0)
Lymphocytes Relative: 34.7 % (ref 12.0–46.0)
Lymphs Abs: 2.8 10*3/uL (ref 0.7–4.0)
MCHC: 33.2 g/dL (ref 30.0–36.0)
MCV: 93.9 fl (ref 78.0–100.0)
Monocytes Absolute: 0.6 10*3/uL (ref 0.1–1.0)
Monocytes Relative: 7.7 % (ref 3.0–12.0)
Neutro Abs: 4.4 10*3/uL (ref 1.4–7.7)
Neutrophils Relative %: 54.7 % (ref 43.0–77.0)
Platelets: 194 10*3/uL (ref 150.0–400.0)
RBC: 5.77 Mil/uL — ABNORMAL HIGH (ref 3.87–5.11)
RDW: 14.8 % (ref 11.5–15.5)
WBC: 8.1 10*3/uL (ref 4.0–10.5)

## 2016-11-28 LAB — LIPID PANEL
Cholesterol: 133 mg/dL (ref 0–200)
HDL: 39 mg/dL — ABNORMAL LOW (ref >39.00)
NonHDL: 94.38
Total CHOL/HDL Ratio: 3
Triglycerides: 259 mg/dL — ABNORMAL HIGH (ref 0.0–149.0)
VLDL: 51.8 mg/dL — ABNORMAL HIGH (ref 0.0–40.0)

## 2016-11-28 LAB — LDL CHOLESTEROL, DIRECT: Direct LDL: 57 mg/dL

## 2016-11-28 LAB — HEMOGLOBIN A1C: Hgb A1c MFr Bld: 7.7 % — ABNORMAL HIGH (ref 4.6–6.5)

## 2016-11-28 NOTE — Telephone Encounter (Signed)
CRITICAL VALUE STICKER  CRITICAL VALUE: HGB 18  RECEIVER (on-site recipient of call): Baily in the lab   DATE & TIME NOTIFIED: 11/28/16 10:27 AM   MESSENGER (representative from lab): Santiago Glad   MD NOTIFIED: Yes   TIME OF NOTIFICATION:10:30 AM   RESPONSE:

## 2016-11-30 NOTE — Telephone Encounter (Signed)
Critical result reviewed at time of notification. Patient call attempted by CMA - see lab phone note.

## 2016-12-05 ENCOUNTER — Telehealth: Payer: Self-pay | Admitting: Family Medicine

## 2016-12-05 NOTE — Telephone Encounter (Signed)
Patient came in today thinking that her appointment was today. I advised patient on when appointment actually is which is 3/20. Patient requests a call to discuss lab results as she had some questions for the CMA. Please call patient to advise.

## 2016-12-05 NOTE — Telephone Encounter (Signed)
LM for patient to return call.

## 2016-12-08 ENCOUNTER — Telehealth: Payer: Self-pay | Admitting: *Deleted

## 2016-12-08 NOTE — Telephone Encounter (Signed)
Left VM requesting return call to schedule AWV.

## 2016-12-09 ENCOUNTER — Encounter: Payer: Self-pay | Admitting: Family Medicine

## 2016-12-09 ENCOUNTER — Ambulatory Visit (INDEPENDENT_AMBULATORY_CARE_PROVIDER_SITE_OTHER): Payer: Medicare Other | Admitting: Family Medicine

## 2016-12-09 ENCOUNTER — Ambulatory Visit (INDEPENDENT_AMBULATORY_CARE_PROVIDER_SITE_OTHER): Payer: Medicare Other

## 2016-12-09 VITALS — BP 104/64 | HR 76 | Temp 98.2°F | Ht 65.0 in | Wt 289.8 lb

## 2016-12-09 DIAGNOSIS — E119 Type 2 diabetes mellitus without complications: Secondary | ICD-10-CM

## 2016-12-09 DIAGNOSIS — D751 Secondary polycythemia: Secondary | ICD-10-CM

## 2016-12-09 DIAGNOSIS — I25708 Atherosclerosis of coronary artery bypass graft(s), unspecified, with other forms of angina pectoris: Secondary | ICD-10-CM

## 2016-12-09 DIAGNOSIS — Z720 Tobacco use: Secondary | ICD-10-CM

## 2016-12-09 DIAGNOSIS — J449 Chronic obstructive pulmonary disease, unspecified: Secondary | ICD-10-CM

## 2016-12-09 DIAGNOSIS — E785 Hyperlipidemia, unspecified: Secondary | ICD-10-CM | POA: Diagnosis not present

## 2016-12-09 LAB — MICROALBUMIN / CREATININE URINE RATIO
Creatinine,U: 79.4 mg/dL
Microalb Creat Ratio: 0.9 mg/g (ref 0.0–30.0)
Microalb, Ur: <0.7 mg/dL (ref 0.0–1.9)

## 2016-12-09 NOTE — Progress Notes (Signed)
[name] [name] is a 66 y.o. female is here to discuss:  History of Present Illness:   Chief Complaint  Patient presents with  . Follow-up    Labs   [name].  HPI:   CBC Latest Ref Rng & Units 11/27/2016 02/29/2016  WBC 4.0 - 10.5 K/uL 8.1 9.8  Hemoglobin 12.0 - 15.0 g/dL 18.0 Repeated and verified X2.(Reserve) 16.1(H)  Hematocrit 36.0 - 46.0 % 54.2 Repeated and verified X2.(H) 50.5(H)  Platelets 150.0 - 400.0 K/uL 194.0 143(L)   Polycythemia: Hx of COPD. Smoker. Recently on two courses of Prednisone. Possible OSA. No Hx of the same per patient, though elevated Hgb in 2017 as above. No Hx of oxygen use. Cardiovascular ROS: negative for - chest pain, edema, irregular heartbeat or paroxysmal nocturnal dyspnea. Respiratory ROS: negative for - hemoptysis, orthopnea, pleuritic pain, sputum changes or tachypnea.  Health Maintenance Due  Topic Date Due  . Hepatitis C Screening  20-Apr-1951  . HIV Screening  06/16/1968  . MAMMOGRAM  06/17/2003   PMHx, SurgHx, SocialHx, FamHx, Medications, and Allergies were reviewed in the Visit Navigator and updated as appropriate.   Patient Active Problem List   Diagnosis Date Noted  . Diabetes mellitus without complication ([place])   . Coronary artery disease   . Bronchitis, asthmatic   . MI, old   . Hypothyroidism   . COPD (chronic obstructive pulmonary disease) ([place]) 11/07/2016  . Goiter 10/31/2016  . S/P coronary artery stent placement 04/16/2016  . Hypertensive heart disease 04/16/2016  . Hyperlipidemia 04/16/2016  . Tobacco abuse 04/16/2016  . Morbid obesity ([place]) 04/16/2016    Social History  Substance Use Topics  . Smoking status: Light Tobacco Smoker  . Smokeless tobacco: Never Used     Comment: 5-6 cigarettes per day  . Alcohol use No    Current Medications and Allergies:    Current Outpatient Prescriptions:  .  ALPRAZolam (XANAX) 0.5 MG tablet, Take 1 tablet (0.5 mg total) by mouth 2 (two)  times daily as needed for anxiety., Disp: 30 tablet, Rfl: 0 .  aspirin 81 MG tablet, Take 81 mg by mouth daily., Disp: , Rfl:  .  atorvastatin (LIPITOR) 10 MG tablet, Take 1 tablet (10 mg total) by mouth daily., Disp: 30 tablet, Rfl: 10 .  celecoxib (CELEBREX) 200 MG capsule, Take 200 mg by mouth daily., Disp: , Rfl: 2 .  clopidogrel (PLAVIX) 75 MG tablet, Take 75 mg by mouth daily., Disp: , Rfl: 2 .  Fluticasone-Salmeterol (ADVAIR DISKUS) 250-50 MCG/DOSE AEPB, Inhale 1 puff into the lungs 2 (two) times daily., Disp: 60 each, Rfl: 2 .  furosemide (LASIX) 40 MG tablet, Take 1 tablet (40 mg total) by mouth daily as needed for fluid., Disp: 30 tablet, Rfl: 6 .  gabapentin (NEURONTIN) 300 MG capsule, Take 300 mg by mouth 2 (two) times daily., Disp: , Rfl:  .  INVOKANA 300 MG TABS tablet, Take 300 mg by mouth daily., Disp: , Rfl:  .  isosorbide mononitrate (IMDUR) 60 MG 24 hr tablet, Take 1 tablet (60 mg total) by mouth daily., Disp: 90 tablet, Rfl: 3 .  levothyroxine (SYNTHROID, LEVOTHROID) 125 MCG tablet, Take 125 mcg by mouth daily., Disp: , Rfl:  .  metFORMIN (GLUCOPHAGE) 500 MG tablet, Take 500 mg by mouth daily., Disp: , Rfl: 3 .  metoprolol succinate (TOPROL-XL) 50 MG 24 hr tablet, Take 75 mg by mouth daily. Take with or immediately following a meal., Disp: ,  Rfl:  .  nitroGLYCERIN (NITROSTAT) 0.4 MG SL tablet, Place 1 tablet (0.4 mg total) under the tongue every 5 (five) minutes as needed for chest pain., Disp: 20 tablet, Rfl: 1 .  promethazine-codeine (PHENERGAN WITH CODEINE) 6.25-10 MG/5ML syrup, Take 5 mLs by mouth every 6 (six) hours as needed for cough., Disp: 240 mL, Rfl: 0   Allergies  Allergen Reactions  . Codeine Hives  . Dilaudid [Hydromorphone Hcl] Nausea And Vomiting  . Iodides   . Reglan [Metoclopramide] Other (See Comments)    Other reaction(s): Dystonia Hallucinations/Violent  . Reglan [Metoclopramide]   . Tramadol   . Dilaudid [Hydromorphone Hcl] Nausea And Vomiting  .  Tape Rash    Blisters     Review of Systems   Review of Systems  Constitutional: Negative for chills and fever.  HENT: Negative for congestion, sinus pain and sore throat.   Eyes: Negative for blurred vision and double vision.  Respiratory: Positive for shortness of breath. Negative for cough.        Chronic since she has been sick.  Cardiovascular: Negative for chest pain, palpitations and leg swelling.  Gastrointestinal: Negative for abdominal pain, constipation and vomiting.  Genitourinary: Negative for dysuria and hematuria.  Musculoskeletal: Negative for back pain and joint pain.  Skin: Negative for itching and rash.  Neurological: Negative for dizziness, weakness and headaches.  Psychiatric/Behavioral: Negative for depression and memory loss.    Vitals:   Vitals:   12/09/16 1051  BP: 104/64  Pulse: 76  Temp: 98.2 F (36.8 C)  TempSrc: Oral  SpO2: 96%  Weight: 289 lb 12.8 oz (131.5 kg)  Height: 5\' 5"  (1.651 m)     Body mass index is 48.23 kg/m.   Physical Exam:    Physical Exam  Constitutional: She appears well-developed and well-nourished. No distress.  Eyes: Pupils are equal, round, and reactive to light.  Neck: Normal range of motion. No thyromegaly present.  Cardiovascular: Normal rate and regular rhythm.   Pulmonary/Chest: Effort normal. No respiratory distress. She has decreased breath sounds.  Abdominal: Soft.  Neurological: She is alert.  Skin: Skin is warm and dry.  Psychiatric: She has a normal mood and affect. Her behavior is normal.   Results for orders placed or performed in visit on 12/09/16  Microalbumin / creatinine urine ratio  Result Value Ref Range   Microalb, Ur <0.7 0.0 - 1.9 mg/dL   Creatinine,U 79.4 mg/dL   Microalb Creat Ratio 0.9 0.0 - 30.0 mg/g   Assessment and Plan:   [name] [name] was seen today for follow-up.  Diagnoses and all orders for this visit:  Polycythemia Comments: Likely due to smoking, COPD. Will also go ahead and  refer to Pulmonology. May need to send to Heme. Orders: -     Home sleep test; Future  Chronic obstructive pulmonary disease, unspecified COPD type ([place]) Comments:         Patient denied being told that she had COPD. Will suspected hypoxic issues, checked CXR - no acute changes. Orders: -     DG Chest 2 View - chronic bronchitis changes  Diabetes mellitus without complication ([place]) Comments: Recent prednisone use. Not on ACEI/ARB. Orders: -     Microalbumin / creatinine urine ratio  Tobacco abuse Comments: I advised patient to quit smoking, and offered support. [place] QUITLINE: 1-800-QUIT-NOW ([phone] [phone])  Morbid obesity ([place]) Comments: The patient is asked to make an attempt to improve diet and exercise patterns to aid in medical management of this problem.    Marland Kitchen  Reviewed expectations re: course of current medical issues. . Discussed self-management of symptoms. . Outlined signs and symptoms indicating need for more acute intervention. . Patient verbalized understanding and all questions were answered. . See orders for this visit as documented in the electronic medical record. . Patient received an After Visit Summary.  CMA served as Education administrator during this visit. History, Physical, and Plan performed by medical provider. Documentation and orders reviewed and attested to. Briscoe Deutscher, D.O.  Briscoe Deutscher, [place], [place] 12/12/2016  Follow-up: 3 months.

## 2016-12-09 NOTE — Telephone Encounter (Signed)
Patient is coming in for appointment today.

## 2016-12-09 NOTE — Progress Notes (Signed)
Pre visit review using our clinic review tool, if applicable. No additional management support is needed unless otherwise documented below in the visit note. 

## 2016-12-16 ENCOUNTER — Ambulatory Visit
Admission: RE | Admit: 2016-12-16 | Discharge: 2016-12-16 | Disposition: A | Discharge status: Home / Self Care | Payer: Medicare Other | Source: Ambulatory Visit | Attending: Family Medicine | Admitting: Family Medicine

## 2016-12-16 DIAGNOSIS — Z1231 Encounter for screening mammogram for malignant neoplasm of breast: Secondary | ICD-10-CM | POA: Diagnosis not present

## 2016-12-16 DIAGNOSIS — Z1239 Encounter for other screening for malignant neoplasm of breast: Secondary | ICD-10-CM

## 2016-12-18 ENCOUNTER — Telehealth: Payer: Self-pay | Admitting: Family Medicine

## 2016-12-18 ENCOUNTER — Other Ambulatory Visit: Payer: Self-pay | Admitting: Cardiovascular Disease

## 2016-12-18 NOTE — Telephone Encounter (Signed)
Patient called stating she received call from you earlier. Just wanted to touch base, I did not see a phone note in her chart.

## 2016-12-18 NOTE — Telephone Encounter (Signed)
Please review for refill. Thanks!  

## 2016-12-18 NOTE — Telephone Encounter (Signed)
Called patient to relay mammogram results.  Patient verbalized understanding.

## 2016-12-19 NOTE — Telephone Encounter (Signed)
Rx request sent to pharmacy.  

## 2016-12-31 DIAGNOSIS — G4733 Obstructive sleep apnea (adult) (pediatric): Secondary | ICD-10-CM | POA: Diagnosis not present

## 2017-01-01 ENCOUNTER — Telehealth: Payer: Self-pay | Admitting: Family Medicine

## 2017-01-01 NOTE — Telephone Encounter (Signed)
Please advise on refill.

## 2017-01-01 NOTE — Telephone Encounter (Signed)
**  Remind patient they can make refill requests via MyChart**  Medication refill request (Name & Dosage):  ALPRAZolam Duanne Moron) 0.5 MG tablet  Preferred pharmacy (Name & Address):   [facility] [phone] - [place], [address]  Other comments (if applicable):

## 2017-01-02 ENCOUNTER — Other Ambulatory Visit: Payer: Self-pay | Admitting: *Deleted

## 2017-01-02 DIAGNOSIS — D751 Secondary polycythemia: Secondary | ICD-10-CM

## 2017-01-02 DIAGNOSIS — G4733 Obstructive sleep apnea (adult) (pediatric): Secondary | ICD-10-CM | POA: Diagnosis not present

## 2017-01-02 NOTE — Telephone Encounter (Signed)
Okay refill. Let her know that I'd like for her to use it more sparingly if possible - I know she is under a lot of stress at this time.

## 2017-01-05 ENCOUNTER — Other Ambulatory Visit: Payer: Self-pay | Admitting: Surgical

## 2017-01-05 DIAGNOSIS — F430 Acute stress reaction: Principal | ICD-10-CM

## 2017-01-05 DIAGNOSIS — F411 Generalized anxiety disorder: Secondary | ICD-10-CM

## 2017-01-05 MED ORDER — ALPRAZOLAM 0.5 MG PO TABS: 0.5000 mg | ORAL_TABLET | Freq: Two times a day (BID) | ORAL | 0 refills | Status: DC | PRN

## 2017-01-05 NOTE — Progress Notes (Signed)
No voicemail set up. Faxed RX to pharmacy.

## 2017-01-06 ENCOUNTER — Telehealth: Payer: Self-pay | Admitting: Family Medicine

## 2017-01-06 NOTE — Telephone Encounter (Signed)
Patient was returning call from today from unknown caller (per patient). Patient states she did contact office Thursday and was unsure if it was in regards to that. Please call and try to advise patient.

## 2017-01-07 NOTE — Telephone Encounter (Signed)
Spoke with patient and gave results. 

## 2017-01-07 NOTE — Telephone Encounter (Signed)
RX was faxed to pharmacy on 01/05/17.

## 2017-01-15 ENCOUNTER — Encounter: Payer: Self-pay | Admitting: Internal Medicine

## 2017-01-15 ENCOUNTER — Ambulatory Visit (INDEPENDENT_AMBULATORY_CARE_PROVIDER_SITE_OTHER): Payer: Medicare Other | Admitting: Internal Medicine

## 2017-01-15 VITALS — BP 104/68 | HR 72 | Ht 64.0 in | Wt 294.0 lb

## 2017-01-15 DIAGNOSIS — F17210 Nicotine dependence, cigarettes, uncomplicated: Secondary | ICD-10-CM | POA: Diagnosis not present

## 2017-01-15 DIAGNOSIS — I25708 Atherosclerosis of coronary artery bypass graft(s), unspecified, with other forms of angina pectoris: Secondary | ICD-10-CM

## 2017-01-15 DIAGNOSIS — J449 Chronic obstructive pulmonary disease, unspecified: Secondary | ICD-10-CM | POA: Diagnosis not present

## 2017-01-15 MED ORDER — BUDESONIDE-FORMOTEROL FUMARATE 160-4.5 MCG/ACT IN AERO: 2.0000 | INHALATION_SPRAY | Freq: Two times a day (BID) | RESPIRATORY_TRACT | 11 refills | Status: DC

## 2017-01-15 MED ORDER — BUDESONIDE-FORMOTEROL FUMARATE 160-4.5 MCG/ACT IN AERO: 2.0000 | INHALATION_SPRAY | Freq: Two times a day (BID) | RESPIRATORY_TRACT | 0 refills | Status: DC

## 2017-01-15 NOTE — Progress Notes (Signed)
Subjective:     Patient ID: [name] [name], female   DOB: 1951/05/22,   MRN: [id number]  HPI   43 yowf active smoker with new cough > sob around 2013 better on inhalers some better and seen [facility] doctor who dx "no copd "  rec on same med which by that point was Advair bid / neb tid and did ok but much worse x around 1st of 2018 referred to pulmonary clinic 01/15/2017 by Dr  Briscoe Deutscher.   01/15/2017 1st [facility] office visit/ [name] [name]   Chief Complaint  Patient presents with  . Pulmonary Consult    Referred by [name]. Pt c/o DOE for the past 3 months.  She gets SOB walking distances such as from parking lot to our building. She also has some cough with clear sputum.  She uses proair and albuterol nebs both 2 x daily on average.   doe  Now = MMRC3 = can't walk 100 yards even at a slow pace at a flat grade s stopping due to sob    Nebs help more than advair bring up white mucus  But don't really help her breathing; however, using them pretty regularly = up to tid   No obvious day to day or daytime variability or assoc  purulent sputum or mucus plugs or hemoptysis or cp or chest tightness, subjective wheeze or overt sinus or hb symptoms. No unusual exp hx or h/o childhood pna/ asthma or knowledge of premature birth.  Sleeping ok without nocturnal  or early am exacerbation  of respiratory  c/o's or need for noct saba. Also denies any obvious fluctuation of symptoms with weather or environmental changes or other aggravating or alleviating factors except as outlined above   Current Medications, Allergies, Complete Past Medical History, Past Surgical History, Family History, and Social History were reviewed in [employer] record.  ROS  The following are not active complaints unless bolded sore throat, dysphagia, dental problems, itching, sneezing,  nasal congestion or excess/ purulent secretions, ear ache,   fever, chills, sweats, unintended wt loss,  classically pleuritic or exertional cp,  orthopnea pnd or leg swelling, presyncope, palpitations, abdominal pain, anorexia, nausea, vomiting, diarrhea  or change in bowel or bladder habits, change in stools or urine, dysuria,hematuria,  rash, arthralgias, visual complaints, headache, numbness, weakness or ataxia or problems with walking or coordination,  change in mood/affect or memory.         Review of Systems     Objective:   Physical Exam    obese wf nad   Wt Readings from Last 3 Encounters:  01/15/17 294 lb (133.4 kg)  12/09/16 289 lb 12.8 oz (131.5 kg)  11/27/16 287 lb (130.2 kg)    Vital signs reviewed  - Note on arrival 02 sats  95% on RA      HEENT: nl turbinates bilaterally, and oropharynx. Nl external ear canals without cough reflex - very poor dentition    NECK :  without JVD/Nodes/TM/ nl carotid upstrokes bilaterally   LUNGS: no acc muscle use,  Nl contour chest with minimal mostly upper airway insp and exp rhonchi bilaterally    CV:  RRR  no s3 or murmur or increase in P2, and no edema   ABD: very obese/soft and nontender with limited  inspiratory excursion in the supine position. No bruits or organomegaly appreciated, bowel sounds nl  MS:  Nl gait/ ext warm without deformities, calf tenderness, cyanosis or clubbing No obvious  joint restrictions   SKIN: warm and dry without lesions    NEURO:  alert, approp, nl sensorium with  no motor or cerebellar deficits apparent.     I personally reviewed images and agree with radiology impression as follows:  CXR:   12/09/16 Bronchitic changes and possible chronic interstitial lung disease.   Assessment:

## 2017-01-15 NOTE — Patient Instructions (Addendum)
Work on inhaler technique:  relax and gently blow all the way out then take a nice smooth deep breath back in, triggering the inhaler at same time you start breathing in.  Hold for up to 5 seconds if you can. Blow out thru nose. Rinse and gargle with water when done  The key is to stop smoking completely before smoking completely stops you - it's not too late!    Plan A = Automatic = Symbicort 160 Take 2 puffs first thing in am and then another 2 puffs about 12 hours later.     Plan B = Backup Only use your albuterol as a rescue medication to be used if you can't catch your breath by resting or doing a relaxed purse lip breathing pattern.  - The less you use it, the better it will work when you need it. - Ok to use the inhaler up to 2 puffs  every 4 hours if you must but call for appointment if use goes up over your usual need - Don't leave home without it !!  (think of it like the spare tire for your car)   Plan C = Crisis - only use your albuterol nebulizer if you first try Plan B and it fails to help > ok to use the nebulizer up to every 4 hours but if start needing it regularly call for immediate appointment   Please schedule a follow up office visit in 6 weeks, call sooner if needed with all medications /inhalers/ solutions in hand so we can verify exactly what you are taking. This includes all medications from all doctors and over the counters

## 2017-01-16 NOTE — Assessment & Plan Note (Signed)
Spirometry 01/15/2017  FEV1 2.08 (84%)  Ratio 80 w/in 4 h of saba neb  - 01/15/2017  After extensive coaching HFA effectiveness =    75% > try change advair to symbicort 160 2bid   I agree with her previous pulmonary md in [place] >> she has GOLD  0 criteria with nl spirometry but may be due to so much saba use which is inappropriate and sets her up for tachyphylaxis from saba.  Instead needs a better maint rx and symb 160 2bid is a reasonable start.  Smoking cessation at this point is also critical (see separate a/p)   Will see her back in 6 weeks to see how she's doing better inhaling hfa instead of cigarettes.  Total time devoted to counseling  > 50 % of initial 60 min office visit:  review case with pt/ discussion of options/alternatives/ personally creating written customized instructions  in presence of pt  then going over those specific  Instructions directly with the pt including how to use all of the meds but in particular covering each new medication in detail and the difference between the maintenance= "automatic" meds and the prns using an action plan format for the latter (If this problem/symptom => do that organization reading Left to right).  Please see AVS from this visit for a full list of these instructions which I personally wrote for this pt and  are unique to this visit.

## 2017-01-16 NOTE — Assessment & Plan Note (Signed)
Body mass index is 50.46 kg/m.  -  trending up  No results found for: TSH   Contributing to gerd risk/ doe/reviewed the need and the process to achieve and maintain neg calorie balance > defer f/u primary care including intermittently monitoring thyroid status

## 2017-01-16 NOTE — Assessment & Plan Note (Signed)

## 2017-01-28 ENCOUNTER — Encounter: Payer: Self-pay | Admitting: Family Medicine

## 2017-01-28 ENCOUNTER — Ambulatory Visit (INDEPENDENT_AMBULATORY_CARE_PROVIDER_SITE_OTHER): Payer: Medicare Other | Admitting: Family Medicine

## 2017-01-28 ENCOUNTER — Telehealth: Payer: Self-pay | Admitting: Internal Medicine

## 2017-01-28 VITALS — BP 118/72 | HR 72 | Temp 98.4°F | Ht 64.0 in | Wt 287.8 lb

## 2017-01-28 DIAGNOSIS — E119 Type 2 diabetes mellitus without complications: Secondary | ICD-10-CM | POA: Diagnosis not present

## 2017-01-28 DIAGNOSIS — Z1159 Encounter for screening for other viral diseases: Secondary | ICD-10-CM | POA: Diagnosis not present

## 2017-01-28 DIAGNOSIS — F17200 Nicotine dependence, unspecified, uncomplicated: Secondary | ICD-10-CM | POA: Diagnosis not present

## 2017-01-28 DIAGNOSIS — G4733 Obstructive sleep apnea (adult) (pediatric): Secondary | ICD-10-CM | POA: Diagnosis not present

## 2017-01-28 DIAGNOSIS — E039 Hypothyroidism, unspecified: Secondary | ICD-10-CM

## 2017-01-28 DIAGNOSIS — Z114 Encounter for screening for human immunodeficiency virus [HIV]: Secondary | ICD-10-CM

## 2017-01-28 DIAGNOSIS — F17210 Nicotine dependence, cigarettes, uncomplicated: Secondary | ICD-10-CM

## 2017-01-28 DIAGNOSIS — I25708 Atherosclerosis of coronary artery bypass graft(s), unspecified, with other forms of angina pectoris: Secondary | ICD-10-CM

## 2017-01-28 LAB — COMPREHENSIVE METABOLIC PANEL
ALT: 15 U/L (ref 0–35)
AST: 14 U/L (ref 0–37)
Albumin: 4.2 g/dL (ref 3.5–5.2)
Alkaline Phosphatase: 100 U/L (ref 39–117)
BUN: 15 mg/dL (ref 6–23)
CO2: 32 mEq/L (ref 19–32)
Calcium: 10.1 mg/dL (ref 8.4–10.5)
Chloride: 102 mEq/L (ref 96–112)
Creatinine, Ser: 0.99 mg/dL (ref 0.40–1.20)
GFR: 60.09 mL/min (ref >60.00)
Glucose, Bld: 191 mg/dL — ABNORMAL HIGH (ref 70–99)
Potassium: 4.4 mEq/L (ref 3.5–5.1)
Sodium: 139 mEq/L (ref 135–145)
Total Bilirubin: 0.7 mg/dL (ref 0.2–1.2)
Total Protein: 7.4 g/dL (ref 6.0–8.3)

## 2017-01-28 LAB — CBC
HCT: 53.7 % — ABNORMAL HIGH (ref 36.0–46.0)
Hemoglobin: 17.8 g/dL — ABNORMAL HIGH (ref 12.0–15.0)
MCHC: 33.1 g/dL (ref 30.0–36.0)
MCV: 95 fl (ref 78.0–100.0)
Platelets: 177 10*3/uL (ref 150.0–400.0)
RBC: 5.65 Mil/uL — ABNORMAL HIGH (ref 3.87–5.11)
RDW: 15.6 % — ABNORMAL HIGH (ref 11.5–15.5)
WBC: 8.3 10*3/uL (ref 4.0–10.5)

## 2017-01-28 LAB — T4, FREE: Free T4: 1.09 ng/dL (ref 0.60–1.60)

## 2017-01-28 LAB — TSH: TSH: 0.22 u[IU]/mL — ABNORMAL LOW (ref 0.35–4.50)

## 2017-01-28 NOTE — Progress Notes (Signed)
[name] [name] is a 66 y.o. female is here for follow up.  History of Present Illness:   [name] [name] CMA acting as scribe for Dr. Juleen China.  CC: Patient comes in today to follow up on her sleep study that she had done. She would also like to have lab work done today to check her thyroid levels. She states that she is having more than usual fatigue over the last week.  HPI:  1. Mild obstructive sleep apnea-hypopnea syndrome. Her recent sleep study. Unfortunately, this information was not available to her pulmonologist at her last appointment. Impression: Mild obstructive sleep apnea with hypopnea is causing severe oxygen desaturation. Suspect underlying cardiopulmonary disease. Recommendations: Treatment options for this degree of sleep-disordered breathing including weight loss and CPAP therapy. Given severity of desaturations, CPAP titration study should be scheduled. This sleep study is scanned into Epic.   2. Diabetes mellitus without complication ([name] [name]). Tolerating medications without noted side effects.   3. Need for hepatitis C screening test   4. Screening for HIV (human immunodeficiency virus)   5. Acquired hypothyroidism. Due for lab testing. The patient does endorse increased fatigue recently. This may be due to above untreated sleep apnea.    Health Maintenance Due  Topic Date Due  . Hepatitis C Screening  1950-12-21  . OPHTHALMOLOGY EXAM  06/17/1963  . HIV Screening  06/16/1968   PMHx, SurgHx, SocialHx, FamHx, Medications, and Allergies were reviewed in the Visit Navigator and updated as appropriate.   Patient Active Problem List   Diagnosis Date Noted  . Diabetes mellitus without complication ([place])   . Coronary artery disease   . Bronchitis, asthmatic   . MI, old   . Hypothyroidism   . COPD GOLD 0/ prob AB still smoking  11/07/2016  . Goiter 10/31/2016  . S/P coronary artery stent placement 04/16/2016  . Hypertensive heart disease 04/16/2016  . Hyperlipidemia  04/16/2016  . Cigarette smoker 04/16/2016  . Morbid (severe) obesity due to excess calories ([name] [name]) 04/16/2016   Social History  Substance Use Topics  . Smoking status: Current Every Day Smoker    Packs/day: 0.50    Years: 31.00  . Smokeless tobacco: Never Used  . Alcohol use No   Current Medications and Allergies:   .  albuterol (ACCUNEB) 1.25 MG/3ML nebulizer solution, Take 1 ampule by nebulization 3 (three) times daily as needed for wheezing., Disp: , Rfl:  .  albuterol (PROAIR HFA) 108 (90 Base) MCG/ACT inhaler, Inhale 2 puffs into the lungs every 6 (six) hours as needed for wheezing or shortness of breath., Disp: , Rfl:  .  ALPRAZolam (XANAX) 0.5 MG tablet, Take 1 tablet (0.5 mg total) by mouth 2 (two) times daily as needed for anxiety., Disp: 30 tablet, Rfl: 0 .  aspirin 81 MG tablet, Take 81 mg by mouth daily., Disp: , Rfl:  .  atorvastatin (LIPITOR) 10 MG tablet, Take 1 tablet (10 mg total) by mouth daily., Disp: 30 tablet, Rfl: 10 .  budesonide-formoterol (SYMBICORT) 160-4.5 MCG/ACT inhaler, Inhale 2 puffs into the lungs 2 (two) times daily., Disp: 1 Inhaler, Rfl: 11 .  celecoxib (CELEBREX) 200 MG capsule, Take 200 mg by mouth daily., Disp: , Rfl: 2 .  clopidogrel (PLAVIX) 75 MG tablet, Take 75 mg by mouth daily., Disp: , Rfl: 2 .  furosemide (LASIX) 40 MG tablet, Take 1 tablet (40 mg total) by mouth daily as needed for fluid., Disp: 30 tablet, Rfl: 6 .  gabapentin (NEURONTIN) 300 MG capsule, Take 300  mg by mouth 2 (two) times daily., Disp: , Rfl:  .  INVOKANA 300 MG TABS tablet, Take 300 mg by mouth daily., Disp: , Rfl:  .  isosorbide mononitrate (IMDUR) 60 MG 24 hr tablet, Take 1 tablet (60 mg total) by mouth daily., Disp: 90 tablet, Rfl: 3 .  levothyroxine (SYNTHROID, LEVOTHROID) 125 MCG tablet, Take 125 mcg by mouth daily., Disp: , Rfl:  .  metFORMIN (GLUCOPHAGE) 500 MG tablet, Take 500 mg by mouth daily., Disp: , Rfl: 3 .  metoprolol succinate (TOPROL-XL) 50 MG 24 hr tablet,  TAKE 1 1/2 TABLETS BY MOUTH EVERY DAY, Disp: 135 tablet, Rfl: 1 .  nitroGLYCERIN (NITROSTAT) 0.4 MG SL tablet, Place 1 tablet (0.4 mg total) under the tongue every 5 (five) minutes as needed for chest pain., Disp: 20 tablet, Rfl: 1  Allergies  Allergen Reactions  . Codeine Hives  . Dilaudid [Hydromorphone Hcl] Nausea And Vomiting  . Iodides   . Reglan [Metoclopramide] Other (See Comments)    Other reaction(s): Dystonia Hallucinations/Violent  . Reglan [Metoclopramide]   . Tramadol   . Dilaudid [Hydromorphone Hcl] Nausea And Vomiting  . Tape Rash    Blisters    Review of Systems   Review of Systems  Constitutional: Positive for malaise/fatigue. Negative for chills and fever.  HENT: Negative for congestion, ear pain, sinus pain and sore throat.   Eyes: Negative for blurred vision and double vision.  Respiratory: Negative for cough, shortness of breath and wheezing.   Cardiovascular: Negative for chest pain, palpitations and leg swelling.  Gastrointestinal: Negative for constipation, diarrhea, nausea and vomiting.  Genitourinary: Negative for dysuria.  Musculoskeletal: Negative for back pain, joint pain and neck pain.  Neurological: Negative for dizziness and headaches.  Psychiatric/Behavioral: Negative for depression, hallucinations and memory loss.   Vitals:   Vitals:   01/28/17 1029  BP: 118/72  Pulse: 72  Temp: 98.4 F (36.9 C)  TempSrc: Oral  SpO2: 94%  Weight: 287 lb 12.8 oz (130.5 kg)  Height: 5\' 4"  (1.626 m)     Body mass index is 49.4 kg/m.   Physical Exam:   Physical Exam  Constitutional: She appears well-developed and well-nourished. No distress.  Eyes: Pupils are equal, round, and reactive to light.  Neck: Normal range of motion. No thyromegaly present.  Cardiovascular: Normal rate and regular rhythm.   Pulmonary/Chest: Effort normal. No respiratory distress. She has decreased breath sounds.  Abdominal: Soft.  Neurological: She is alert.  Skin: Skin  is warm and dry.  Psychiatric: She has a normal mood and affect. Her behavior is normal.   Assessment and Plan:    [name] was seen today for follow-up.  Diagnoses and all orders for this visit:  Mild obstructive sleep apnea-hypopnea syndrome Comments: Sleep study date 12/31/2016. Read by Dr. Elsworth Soho. Impression was mild obstructive sleep apnea. Noted severe oxygen desaturation. CPAP therapy recommended. We'll order CPAP titration study. I will also make the patient's pulmonologist aware of the study.   Diabetes mellitus without complication ([facility]) -     CBC -     Comprehensive metabolic panel  Need for hepatitis C screening test -     Hepatitis C antibody  Screening for HIV (human immunodeficiency virus) -     HIV antibody  Acquired hypothyroidism -     TSH -     T4, free  Smoker       - I advised patient to quit smoking, and offered support. [place] QUITLINE: 1-800-QUIT-NOW (910)643-9361).   Marland Kitchen  Reviewed expectations re: course of current medical issues. . Discussed self-management of symptoms. . Outlined signs and symptoms indicating need for more acute intervention. . Patient verbalized understanding and all questions were answered. . See orders for this visit as documented in the electronic medical record. . Patient received an After Visit Summary.   CMA served as Education administrator during this visit. History, Physical, and Plan performed by medical provider. Documentation and orders reviewed and attested to. Briscoe Deutscher, D.O.  Briscoe Deutscher, DO [place], [place] 01/28/2017  Future Appointments Date Time Provider [place]  02/26/2017 11:15 AM Tanda Rockers, MD [facility] None

## 2017-01-29 LAB — HEPATITIS C ANTIBODY: HCV Ab: NEGATIVE

## 2017-01-29 LAB — HIV ANTIBODY (ROUTINE TESTING W REFLEX): HIV 1&2 Ab, 4th Generation: NONREACTIVE

## 2017-01-29 NOTE — Telephone Encounter (Signed)
LMTCB

## 2017-01-29 NOTE — Telephone Encounter (Signed)
Per MW- Needs cpap titration study and set up f/u with Elsworth Soho 1st available   [facility]

## 2017-01-29 NOTE — Progress Notes (Signed)
Noted  

## 2017-01-29 NOTE — Telephone Encounter (Signed)
Patient returned call, CB is [phone].

## 2017-01-30 NOTE — Telephone Encounter (Signed)
Patient returned call, CB is [phone].  She is stating she also needs an RX for Symbicort now sent to [facility] on [address]

## 2017-01-30 NOTE — Telephone Encounter (Signed)
ATC pt phone just rung unable to leave a vm   When pt saw MW in April she was given a printed rx for her Symbicort for 11 refills...did she lose it?

## 2017-02-02 ENCOUNTER — Telehealth: Payer: Self-pay | Admitting: Family Medicine

## 2017-02-02 NOTE — Telephone Encounter (Signed)
Patient returning phone call about lab results. Please call patient back today.

## 2017-02-02 NOTE — Telephone Encounter (Signed)
Notified patient of results 

## 2017-02-26 ENCOUNTER — Encounter: Payer: Self-pay | Admitting: Internal Medicine

## 2017-02-26 ENCOUNTER — Ambulatory Visit (INDEPENDENT_AMBULATORY_CARE_PROVIDER_SITE_OTHER): Payer: Medicare Other | Admitting: Internal Medicine

## 2017-02-26 VITALS — BP 112/84 | HR 78 | Ht 65.0 in | Wt 293.8 lb

## 2017-02-26 DIAGNOSIS — F17210 Nicotine dependence, cigarettes, uncomplicated: Secondary | ICD-10-CM | POA: Diagnosis not present

## 2017-02-26 DIAGNOSIS — J449 Chronic obstructive pulmonary disease, unspecified: Secondary | ICD-10-CM

## 2017-02-26 DIAGNOSIS — I25708 Atherosclerosis of coronary artery bypass graft(s), unspecified, with other forms of angina pectoris: Secondary | ICD-10-CM

## 2017-02-26 MED ORDER — BUDESONIDE-FORMOTEROL FUMARATE 80-4.5 MCG/ACT IN AERO: 2.0000 | INHALATION_SPRAY | Freq: Two times a day (BID) | RESPIRATORY_TRACT | 11 refills | Status: DC

## 2017-02-26 MED ORDER — BUDESONIDE-FORMOTEROL FUMARATE 80-4.5 MCG/ACT IN AERO: 2.0000 | INHALATION_SPRAY | Freq: Two times a day (BID) | RESPIRATORY_TRACT | 0 refills | Status: DC

## 2017-02-26 NOTE — Progress Notes (Signed)
Subjective:     Patient ID: [name] [name], female   DOB: August 04, 1951,   MRN: [id number]    Brief patient profile:  29 yowf active smoker with new cough > sob around 2013 better on inhalers some better and seen [facility] doctor who dx "no copd "  rec on same med which by that point was Advair bid / neb tid and did ok but much worse x around 1st of 2018 referred to pulmonary clinic 01/15/2017 by Dr  Briscoe Deutscher.    History of Present Illness  01/15/2017 1st [facility] office visit/ [name] [name]   Chief Complaint  Patient presents with  . Pulmonary Consult    Referred by [name]. Pt c/o DOE for the past 3 months.  She gets SOB walking distances such as from parking lot to our building. She also has some cough with clear sputum.  She uses proair and albuterol nebs both 2 x daily on average.   doe  Now = MMRC3 = can't walk 100 yards even at a slow pace at a flat grade s stopping due to sob   Nebs help more than advair bring up white mucus  But don't really help her breathing; however, using them pretty regularly = up to tid  rec Work on inhaler technique:  relax and gently blow all the way out then take a nice smooth deep breath back in, triggering the inhaler at same time you start breathing in.  Hold for up to 5 seconds if you can. Blow out thru nose. Rinse and gargle with water when done The key is to stop smoking completely before smoking completely stops you - it's not too late! Plan A = Automatic = Symbicort 160 Take 2 puffs first thing in am and then another 2 puffs about 12 hours later.  Plan B = Backup Only use your albuterol as a rescue medication to be used  Plan C = Crisis - only use your albuterol nebulizer if you first try Plan B and it fails to help > ok to use the nebulizer up to every 4 hours but if start needing it regularly call for immediate appointment Please schedule a follow up office visit in 6 weeks, call sooner if needed with all medications /inhalers/  solutions in hand so we can verify exactly what you are taking. This includes all medications from all doctors and over the counters      02/26/2017  f/u ov/[name] [name] re:  COPD 0/AB still smoking but stopping today ! Chief Complaint  Patient presents with  . Follow-up    Cough and SOB have improved some. No new co's. She is using proair 3 x per wk on average.    still needs saba p shower and getting ready for bed    No obvious day to day or daytime variability or assoc excess/ purulent sputum or mucus plugs or hemoptysis or cp or chest tightness, subjective wheeze or overt sinus or hb symptoms. No unusual exp hx or h/o childhood pna/ asthma or knowledge of premature birth.  Sleeping ok without nocturnal  or early am exacerbation  of respiratory  c/o's or need for noct saba. Also denies any obvious fluctuation of symptoms with weather or environmental changes or other aggravating or alleviating factors except as outlined above   Current Medications, Allergies, Complete Past Medical History, Past Surgical History, Family History, and Social History were reviewed in [employer] record.  ROS  The following are not  active complaints unless bolded sore throat, dysphagia, dental problems, itching, sneezing,  nasal congestion or excess/ purulent secretions, ear ache,   fever, chills, sweats, unintended wt loss, classically pleuritic or exertional cp,  orthopnea pnd or leg swelling, presyncope, palpitations, abdominal pain, anorexia, nausea, vomiting, diarrhea  or change in bowel or bladder habits, change in stools or urine, dysuria,hematuria,  rash, arthralgias, visual complaints, headache, numbness, weakness or ataxia or problems with walking or coordination,  change in mood/affect or memory.                     Objective:   Physical Exam    obese wf nad    02/26/2017         294   01/15/17 294 lb (133.4 kg)  12/09/16 289 lb 12.8 oz (131.5 kg)  11/27/16 287 lb (130.2 kg)     Vital signs reviewed  - Note on arrival 02 sats  94% on RA      HEENT: nl turbinates bilaterally, and oropharynx. Nl external ear canals without cough reflex - very poor dentition    NECK :  without JVD/Nodes/TM/ nl carotid upstrokes bilaterally   LUNGS: no acc muscle use,  Nl contour chest clear bilaterally to A and P     CV:  RRR  no s3 or murmur or increase in P2, and no edema   ABD: very obese/soft and nontender with limited  inspiratory excursion in the supine position. No bruits or organomegaly appreciated, bowel sounds nl  MS:  Nl gait/ ext warm without deformities, calf tenderness, cyanosis or clubbing No obvious joint restrictions   SKIN: warm and dry without lesions    NEURO:  alert, approp, nl sensorium with  no motor or cerebellar deficits apparent.     I personally reviewed images and agree with radiology impression as follows:  CXR:   12/09/16 Bronchitic changes and possible chronic interstitial lung disease.   Assessment:

## 2017-02-26 NOTE — Assessment & Plan Note (Signed)
Quitting today/ reinforced

## 2017-02-26 NOTE — Assessment & Plan Note (Addendum)
Spirometry 01/15/2017  FEV1 2.08 (84%)  Ratio 80 w/in 4 h of saba neb  - 01/15/2017   try change advair to symbicort 160 2bid  - 02/26/2017  After extensive coaching HFA effectiveness =   75% (short ti) > try symb 80 2bid   Using much less saba now but still smoking  (see separate a/p)   Since does not meet criteria for copd will try a lower strength of symbicort and work on better hfa   I had an extended discussion with the patient reviewing all relevant studies completed to date and  lasting 15 to 20 minutes of a 25 minute visit    Each maintenance medication was reviewed in detail including most importantly the difference between maintenance and prns and under what circumstances the prns are to be triggered using an action plan format that is not reflected in the computer generated alphabetically organized AVS.    Please see AVS for specific instructions unique to this visit that I personally wrote and verbalized to the the pt in detail and then reviewed with pt  by my nurse highlighting any  changes in therapy recommended at today's visit to their plan of care.

## 2017-02-26 NOTE — Patient Instructions (Addendum)
Work on inhaler technique:  relax and gently blow all the way out then take a nice smooth deep breath back in, triggering the inhaler at same time you start breathing in.  Hold for up to 5 seconds if you can. Blow out thru nose. Rinse and gargle with water when done  The key is to stop smoking completely before smoking completely stops you - it's not too late!  Plan A = Automatic = Symbicort 80  Take 2 puffs first thing in am and then another 2 puffs about 12 hours later.     Plan B = Backup Only use your albuterol as a rescue medication to be used if you can't catch your breath by resting or doing a relaxed purse lip breathing pattern.  - The less you use it, the better it will work when you need it. - Ok to use the inhaler up to 2 puffs  every 4 hours if you must but call for appointment if use goes up over your usual need - Don't leave home without it !!  (think of it like the spare tire for your car)   Plan C = Crisis - only use your albuterol nebulizer if you first try Plan B and it fails to help > ok to use the nebulizer up to every 4 hours but if start needing it regularly call for immediate appointment    If you are satisfied with your treatment plan,  let your doctor know and he/she can either refill your medications or you can return here when your prescription runs out.     If in any way you are not 100% satisfied,  please tell us.  If 100% better, tell your friends!  Pulmonary follow up is as needed

## 2017-02-26 NOTE — Assessment & Plan Note (Signed)
Body mass index is 48.89 kg/m.  -  No change Lab Results  Component Value Date   TSH 0.22 (L) 01/28/2017     Contributing to gerd risk/ doe which will worsen with smoking cessation if gains even more wt/reviewed the need and the process to achieve and maintain neg calorie balance > defer f/u primary care including intermittently monitoring thyroid status

## 2017-03-02 ENCOUNTER — Telehealth: Payer: Self-pay | Admitting: Family Medicine

## 2017-03-02 NOTE — Telephone Encounter (Signed)
Left message for patient to return call.

## 2017-03-02 NOTE — Telephone Encounter (Signed)
Is there anywhere someone can donate unopened cases of albuterol?  Thank you,  -LL

## 2017-03-18 NOTE — Telephone Encounter (Signed)
Yes

## 2017-03-18 NOTE — Telephone Encounter (Signed)
Ok to refill 

## 2017-03-18 NOTE — Telephone Encounter (Signed)
Pt would like to have her rx for xanax refilled. Would like this to be called in to [address]

## 2017-03-19 ENCOUNTER — Other Ambulatory Visit: Payer: Self-pay

## 2017-03-19 DIAGNOSIS — F430 Acute stress reaction: Principal | ICD-10-CM

## 2017-03-19 DIAGNOSIS — F411 Generalized anxiety disorder: Secondary | ICD-10-CM

## 2017-03-19 MED ORDER — ALPRAZOLAM 0.5 MG PO TABS: 0.5000 mg | ORAL_TABLET | Freq: Two times a day (BID) | ORAL | 0 refills | Status: DC | PRN

## 2017-03-19 NOTE — Telephone Encounter (Signed)
Rx printed, signed, faxed to patient's pharmacy.

## 2017-04-13 ENCOUNTER — Telehealth: Payer: Self-pay | Admitting: Cardiovascular Disease

## 2017-04-13 ENCOUNTER — Other Ambulatory Visit: Payer: Self-pay | Admitting: Cardiovascular Disease

## 2017-04-13 NOTE — Telephone Encounter (Signed)
Refill Request.  

## 2017-04-13 NOTE — Telephone Encounter (Signed)
New message     *STAT* If patient is at the pharmacy, call can be transferred to refill team.   1. Which medications need to be refilled? (please list name of each medication and dose if known) clopidogrel (PLAVIX) 75 MG tablet  2. Which pharmacy/location (including street and city if local pharmacy) is medication to be sent to? [facility] on [address]   3. Do they need a 30 day or 90 day supply? 90 days supply

## 2017-05-20 ENCOUNTER — Other Ambulatory Visit: Payer: Self-pay | Admitting: *Deleted

## 2017-05-20 ENCOUNTER — Telehealth: Payer: Self-pay | Admitting: Family Medicine

## 2017-05-20 MED ORDER — ISOSORBIDE MONONITRATE ER 60 MG PO TB24: 60.0000 mg | ORAL_TABLET | Freq: Every day | ORAL | 2 refills | Status: DC

## 2017-05-20 MED ORDER — INVOKANA 300 MG PO TABS: 300.0000 mg | ORAL_TABLET | Freq: Every day | ORAL | 2 refills | Status: DC

## 2017-05-20 MED ORDER — LEVOTHYROXINE SODIUM 125 MCG PO TABS: 125.0000 ug | ORAL_TABLET | Freq: Every day | ORAL | 2 refills | Status: DC

## 2017-05-20 NOTE — Telephone Encounter (Signed)
MEDICATION: INVOKANA 300 MG TABS tablet [[id number]]   levothyroxine (SYNTHROID, LEVOTHROID) 125 MCG tablet [[id number]]    PHARMACY:  [facility] [place], [address] AT [address] [place] [address] Phone: [phone] Fax: [phone]     IS THIS A 90 DAY SUPPLY : No  IS PATIENT OUT OF MEDICATION: No- has enough till Friday  IF NOT; HOW MUCH IS LEFT: Friday  LAST APPOINTMENT DATE: .03/02/2017  NEXT APPOINTMENT DATE:   OTHER COMMENTS: Patient would also like return phone call. Is unsure as to when she needs to come back in to have blood sugar and thyroid levels checked.     **Let patient know to contact pharmacy at the end of the day to make sure medication is ready. **  ** Please notify patient to allow 48-72 hours to process**  **Encourage patient to contact the pharmacy for refills or they can request refills through [facility]**

## 2017-05-20 NOTE — Telephone Encounter (Signed)
Okay to refill. Please make sure that she has a f/u appt.

## 2017-05-20 NOTE — Telephone Encounter (Signed)
Please advise.  Historical provider. 

## 2017-05-20 NOTE — Telephone Encounter (Signed)
RX sent to pharmacy and scheduled patient an appointment for next week to follow up.

## 2017-05-20 NOTE — Telephone Encounter (Signed)
Received paper refill request for Isosorbide from [facility]

## 2017-05-27 ENCOUNTER — Encounter: Payer: Self-pay | Admitting: Family Medicine

## 2017-05-27 ENCOUNTER — Ambulatory Visit (INDEPENDENT_AMBULATORY_CARE_PROVIDER_SITE_OTHER): Payer: Medicare Other | Admitting: Family Medicine

## 2017-05-27 VITALS — BP 112/78 | HR 86 | Temp 98.6°F | Ht 65.0 in | Wt 293.8 lb

## 2017-05-27 DIAGNOSIS — F17210 Nicotine dependence, cigarettes, uncomplicated: Secondary | ICD-10-CM

## 2017-05-27 DIAGNOSIS — J4540 Moderate persistent asthma, uncomplicated: Secondary | ICD-10-CM | POA: Diagnosis not present

## 2017-05-27 DIAGNOSIS — I25708 Atherosclerosis of coronary artery bypass graft(s), unspecified, with other forms of angina pectoris: Secondary | ICD-10-CM

## 2017-05-27 DIAGNOSIS — R5383 Other fatigue: Secondary | ICD-10-CM | POA: Diagnosis not present

## 2017-05-27 DIAGNOSIS — E039 Hypothyroidism, unspecified: Secondary | ICD-10-CM

## 2017-05-27 DIAGNOSIS — J329 Chronic sinusitis, unspecified: Secondary | ICD-10-CM

## 2017-05-27 DIAGNOSIS — F419 Anxiety disorder, unspecified: Secondary | ICD-10-CM

## 2017-05-27 DIAGNOSIS — E119 Type 2 diabetes mellitus without complications: Secondary | ICD-10-CM

## 2017-05-27 LAB — COMPREHENSIVE METABOLIC PANEL
ALT: 13 U/L (ref 0–35)
AST: 13 U/L (ref 0–37)
Albumin: 3.8 g/dL (ref 3.5–5.2)
Alkaline Phosphatase: 92 U/L (ref 39–117)
BUN: 14 mg/dL (ref 6–23)
CO2: 31 mEq/L (ref 19–32)
Calcium: 9.7 mg/dL (ref 8.4–10.5)
Chloride: 102 mEq/L (ref 96–112)
Creatinine, Ser: 0.9 mg/dL (ref 0.40–1.20)
GFR: 67.01 mL/min (ref >60.00)
Glucose, Bld: 223 mg/dL — ABNORMAL HIGH (ref 70–99)
Potassium: 4.8 mEq/L (ref 3.5–5.1)
Sodium: 140 mEq/L (ref 135–145)
Total Bilirubin: 0.8 mg/dL (ref 0.2–1.2)
Total Protein: 6.5 g/dL (ref 6.0–8.3)

## 2017-05-27 LAB — CBC WITH DIFFERENTIAL/PLATELET
Basophils Absolute: 0.1 10*3/uL (ref 0.0–0.1)
Basophils Relative: 1.2 % (ref 0.0–3.0)
Eosinophils Absolute: 0.2 10*3/uL (ref 0.0–0.7)
Eosinophils Relative: 3.1 % (ref 0.0–5.0)
HCT: 54 % — ABNORMAL HIGH (ref 36.0–46.0)
Hemoglobin: 17.5 g/dL — ABNORMAL HIGH (ref 12.0–15.0)
Lymphocytes Relative: 32.3 % (ref 12.0–46.0)
Lymphs Abs: 2.2 10*3/uL (ref 0.7–4.0)
MCHC: 32.4 g/dL (ref 30.0–36.0)
MCV: 96.6 fl (ref 78.0–100.0)
Monocytes Absolute: 0.5 10*3/uL (ref 0.1–1.0)
Monocytes Relative: 7.3 % (ref 3.0–12.0)
Neutro Abs: 3.8 10*3/uL (ref 1.4–7.7)
Neutrophils Relative %: 56.1 % (ref 43.0–77.0)
Platelets: 162 10*3/uL (ref 150.0–400.0)
RBC: 5.59 Mil/uL — ABNORMAL HIGH (ref 3.87–5.11)
RDW: 15.8 % — ABNORMAL HIGH (ref 11.5–15.5)
WBC: 6.8 10*3/uL (ref 4.0–10.5)

## 2017-05-27 LAB — HEMOGLOBIN A1C: Hgb A1c MFr Bld: 7.3 % — ABNORMAL HIGH (ref 4.6–6.5)

## 2017-05-27 LAB — TSH: TSH: 0.15 u[IU]/mL — ABNORMAL LOW (ref 0.35–4.50)

## 2017-05-27 LAB — T4, FREE: Free T4: 1.4 ng/dL (ref 0.60–1.60)

## 2017-05-27 MED ORDER — AZITHROMYCIN 250 MG PO TABS: ORAL_TABLET | ORAL | 0 refills | Status: DC

## 2017-05-27 MED ORDER — PREDNISONE 5 MG (21) PO TBPK: ORAL_TABLET | ORAL | 0 refills | Status: DC

## 2017-05-27 MED ORDER — ALPRAZOLAM 0.5 MG PO TABS: 0.5000 mg | ORAL_TABLET | Freq: Two times a day (BID) | ORAL | 0 refills | Status: DC | PRN

## 2017-05-27 MED ORDER — PROMETHAZINE-DM 6.25-15 MG/5ML PO SYRP: 5.0000 mL | ORAL_SOLUTION | Freq: Every evening | ORAL | 0 refills | Status: DC | PRN

## 2017-05-27 NOTE — Progress Notes (Signed)
[name] [name] is a 66 y.o. female is here for follow up.  History of Present Illness:   Water quality scientist, CMA, acting as scribe for Dr. Juleen China.  HPI:  1. Cough. Worsening x 1 week, associated with sinus pain and pressure, purulent nasal drainage, and slight wheeze. Using inhalers with some relief of respiratory symptoms. No fever. No chest, dyspnea, or increased edema.    2. Thyroid: Due for retesting.    3. Anxiety. Requests refill of Xanax. Using sparingly. Helping immensly with panic attacks.    4. Diabetes mellitus without complication ([place]).  Current symptoms: no polyuria or polydipsia, no chest pain, dyspnea or TIA's, no numbness, tingling or pain in extremities.  Taking medication compliantly without noted sided effects []   YES  []   NO  Episodes of hypoglycemia? []   YES  [x]   NO Maintaining a diabetic diet? []   YES  [x]   NO Trying to exercise on a regular basis? []   YES  [x]   NO  On ACE inhibitor or angiotensin II receptor blocker? []   YES  [x]   NO On Aspirin? [x]   YES  []   NO  Lab Results  Component Value Date   HGBA1C 7.3 (H) 05/27/2017    Lab Results  Component Value Date   MICROALBUR <0.7 12/09/2016    Lab Results  Component Value Date   CHOL 133 11/27/2016   HDL 39.00 (L) 11/27/2016   LDLCALC 46 10/31/2016   LDLDIRECT 57.0 11/27/2016   TRIG 259.0 (H) 11/27/2016   CHOLHDL 3 11/27/2016     Wt Readings from Last 3 Encounters:  05/27/17 293 lb 12.8 oz (133.3 kg)  02/26/17 293 lb 12.8 oz (133.3 kg)  01/28/17 287 lb 12.8 oz (130.5 kg)   BP Readings from Last 3 Encounters:  05/27/17 112/78  02/26/17 112/84  01/28/17 118/72   Lab Results  Component Value Date   CREATININE 0.90 05/27/2017     6. Cigarette nicotine dependence. Down to 1/2 ppd. " Working on it."   Health Maintenance Due  Topic Date Due  . OPHTHALMOLOGY EXAM  06/17/1963   Depression screen PHQ 2/9 11/08/2016  Decreased Interest 1  Down, Depressed, Hopeless 0  PHQ - 2 Score 1   PMHx,  SurgHx, SocialHx, FamHx, Medications, and Allergies were reviewed in the Visit Navigator and updated as appropriate.   Patient Active Problem List   Diagnosis Date Noted  . Sinusitis 05/27/2017  . Anxiety 05/27/2017  . Mild obstructive sleep apnea-hypopnea syndrome 01/28/2017  . Diabetes mellitus without complication ([place])   . Coronary artery disease   . Bronchitis, asthmatic   . MI, old   . Hypothyroidism   . COPD GOLD 0/ prob AB still smoking  11/07/2016  . Goiter 10/31/2016  . S/P coronary artery stent placement 04/16/2016  . Hypertensive heart disease 04/16/2016  . Hyperlipidemia 04/16/2016  . Nicotine dependence 04/16/2016  . Morbid (severe) obesity due to excess calories ([place]) 04/16/2016   Social History  Substance Use Topics  . Smoking status: Current Every Day Smoker    Packs/day: 0.50    Years: 31.00  . Smokeless tobacco: Never Used  . Alcohol use No   Current Medications and Allergies:   Current Outpatient Prescriptions:  .  albuterol (ACCUNEB) 1.25 MG/3ML nebulizer solution, Take 1 ampule by nebulization 3 (three) times daily as needed for wheezing., Disp: , Rfl:  .  albuterol (PROAIR HFA) 108 (90 Base) MCG/ACT inhaler, Inhale 2 puffs into the lungs every 6 (six) hours as needed  for wheezing or shortness of breath., Disp: , Rfl:  .  ALPRAZolam (XANAX) 0.5 MG tablet, Take 1 tablet (0.5 mg total) by mouth 2 (two) times daily as needed for anxiety., Disp: 30 tablet, Rfl: 0 .  aspirin 81 MG tablet, Take 81 mg by mouth daily., Disp: , Rfl:  .  atorvastatin (LIPITOR) 10 MG tablet, Take 1 tablet (10 mg total) by mouth daily., Disp: 30 tablet, Rfl: 10 .  budesonide-formoterol (SYMBICORT) 80-4.5 MCG/ACT inhaler, Inhale 2 puffs into the lungs 2 (two) times daily., Disp: 1 Inhaler, Rfl: 11 .  celecoxib (CELEBREX) 200 MG capsule, Take 200 mg by mouth daily., Disp: , Rfl: 2 .  clopidogrel (PLAVIX) 75 MG tablet, TAKE 1 TABLET BY MOUTH DAILY, Disp: 90 tablet, Rfl: 2 .  furosemide  (LASIX) 40 MG tablet, Take 1 tablet (40 mg total) by mouth daily as needed for fluid., Disp: 30 tablet, Rfl: 6 .  gabapentin (NEURONTIN) 300 MG capsule, Take 300 mg by mouth 2 (two) times daily., Disp: , Rfl:  .  INVOKANA 300 MG TABS tablet, Take 1 tablet (300 mg total) by mouth daily., Disp: 30 tablet, Rfl: 2 .  isosorbide mononitrate (IMDUR) 60 MG 24 hr tablet, Take 1 tablet (60 mg total) by mouth daily., Disp: 90 tablet, Rfl: 2 .  levothyroxine (SYNTHROID, LEVOTHROID) 125 MCG tablet, Take 1 tablet (125 mcg total) by mouth daily., Disp: 30 tablet, Rfl: 2 .  metFORMIN (GLUCOPHAGE) 500 MG tablet, Take 500 mg by mouth daily., Disp: , Rfl: 3 .  metoprolol succinate (TOPROL-XL) 50 MG 24 hr tablet, TAKE 1 1/2 TABLETS BY MOUTH EVERY DAY, Disp: 135 tablet, Rfl: 1 .  nitroGLYCERIN (NITROSTAT) 0.4 MG SL tablet, Place 1 tablet (0.4 mg total) under the tongue every 5 (five) minutes as needed for chest pain., Disp: 20 tablet, Rfl: 1  Allergies  Allergen Reactions  . Codeine Hives  . Dilaudid [Hydromorphone Hcl] Nausea And Vomiting  . Iodides   . Reglan [Metoclopramide] Other (See Comments)    Other reaction(s): Dystonia Hallucinations/Violent   . Reglan [Metoclopramide]   . Tramadol   . Dilaudid [Hydromorphone Hcl] Nausea And Vomiting  . Tape Rash    Blisters    Review of Systems   Pertinent items are noted in the HPI. Otherwise, ROS is negative.  Vitals:   Vitals:   05/27/17 1050  BP: 112/78  Pulse: 86  Temp: 98.6 F (37 C)  TempSrc: Oral  SpO2: 93%  Weight: 293 lb 12.8 oz (133.3 kg)  Height: 5\' 5"  (1.651 m)     Body mass index is 48.89 kg/m.   Physical Exam:   Physical Exam  Constitutional: She appears well-developed and well-nourished. No distress.  HENT:  Nose: Right sinus exhibits maxillary sinus tenderness.  Eyes: Pupils are equal, round, and reactive to light.  Neck: Normal range of motion. No thyromegaly present.  Cardiovascular: Normal rate and regular rhythm.     Pulmonary/Chest: Effort normal. No respiratory distress. She has decreased breath sounds.  Abdominal: Soft.  Neurological: She is alert.  Skin: Skin is warm and dry.  Psychiatric: She has a normal mood and affect. Her behavior is normal.    Results for orders placed or performed in visit on 05/27/17  TSH  Result Value Ref Range   TSH 0.15 (L) 0.35 - 4.50 uIU/mL  T4, free  Result Value Ref Range   Free T4 1.40 0.60 - 1.60 ng/dL  Hemoglobin A1c  Result Value Ref Range   Hgb  A1c MFr Bld 7.3 (H) 4.6 - 6.5 %  CBC with Differential/Platelet  Result Value Ref Range   WBC 6.8 4.0 - 10.5 K/uL   RBC 5.59 (H) 3.87 - 5.11 Mil/uL   Hemoglobin 17.5 (H) 12.0 - 15.0 g/dL   HCT 54.0 (H) 36.0 - 46.0 %   MCV 96.6 78.0 - 100.0 fl   MCHC 32.4 30.0 - 36.0 g/dL   RDW 15.8 (H) 11.5 - 15.5 %   Platelets 162.0 150.0 - 400.0 K/uL   Neutrophils Relative % 56.1 43.0 - 77.0 %   Lymphocytes Relative 32.3 12.0 - 46.0 %   Monocytes Relative 7.3 3.0 - 12.0 %   Eosinophils Relative 3.1 0.0 - 5.0 %   Basophils Relative 1.2 0.0 - 3.0 %   Neutro Abs 3.8 1.4 - 7.7 K/uL   Lymphs Abs 2.2 0.7 - 4.0 K/uL   Monocytes Absolute 0.5 0.1 - 1.0 K/uL   Eosinophils Absolute 0.2 0.0 - 0.7 K/uL   Basophils Absolute 0.1 0.0 - 0.1 K/uL  Comprehensive metabolic panel  Result Value Ref Range   Sodium 140 135 - 145 mEq/L   Potassium 4.8 3.5 - 5.1 mEq/L   Chloride 102 96 - 112 mEq/L   CO2 31 19 - 32 mEq/L   Glucose, Bld 223 (H) 70 - 99 mg/dL   BUN 14 6 - 23 mg/dL   Creatinine, Ser 0.90 0.40 - 1.20 mg/dL   Total Bilirubin 0.8 0.2 - 1.2 mg/dL   Alkaline Phosphatase 92 39 - 117 U/L   AST 13 0 - 37 U/L   ALT 13 0 - 35 U/L   Total Protein 6.5 6.0 - 8.3 g/dL   Albumin 3.8 3.5 - 5.2 g/dL   Calcium 9.7 8.4 - 10.5 mg/dL   GFR 67.01 >60.00 mL/min   Assessment and Plan:   [name] [name] was seen today for follow-up.  Diagnoses and all orders for this visit:  Moderate persistent asthmatic bronchitis without  complication Comments: New. Reviewed visit with Dr. Melvyn Novas. Rx provided with precautions. I asked to her to hold the prednisone for now. Red flags reviewed.  Orders: -     promethazine-dextromethorphan (PROMETHAZINE-DM) 6.25-15 MG/5ML syrup; Take 5 mLs by mouth at bedtime as needed for cough. -     predniSONE (STERAPRED UNI-PAK 21 TAB) 5 MG (21) TBPK tablet; 6, 5, 4, 3, 2, 1  Sinusitis, unspecified chronicity, unspecified location Comments: Okay to take Rx as below. Symptomatic care reviewed. Encouraged smoking cessation.  Orders: -     azithromycin (ZITHROMAX) 250 MG tablet; Take 2 tablets on day 1, then 1 tablet on days 2, 3, 4, and 5  Anxiety Comments: Okay Xanax, 30 pills. Seems to last several months. Database reviewed and without concerns.  Orders: -     ALPRAZolam (XANAX) 0.5 MG tablet; Take 1 tablet (0.5 mg total) by mouth 2 (two) times daily as needed for anxiety.  Diabetes mellitus without complication ([place]) Comments: Improved. Will have patient continue treatment for now. Consider Victoza for future management. Due for EYE exam.  Orders: -     Hemoglobin A1c  Fatigue, unspecified type Comments: Ongoing. The patient is asked to make an attempt to improve diet and exercise patterns to aid in medical management of this problem.  Orders: -     TSH -     T4, free -     CBC with Differential/Platelet -     Comprehensive metabolic panel  Cigarette nicotine dependence without complication Comments: Patient is  working on decreasing smoking. Down to 1/2 ppd. The patient was counseled on the dangers of tobacco use, and was advised to quit.  Reviewed strategies to maximize success, including removing cigarettes and smoking materials from environment, stress management, support of family/friends, written materials, local smoking cessation programs (1-800-QUIT-NOW and SMOKEFREE.GOV) and pharmacotherapy. Greater than (3 OR 10) minutes were spent on counseling  today.  Hypothyroidism  Will decrease Levothyroxine, even with normal free T4, as TSH continue to worsen.  . Reviewed expectations re: course of current medical issues. . Discussed self-management of symptoms. . Outlined signs and symptoms indicating need for more acute intervention. . Patient verbalized understanding and all questions were answered. Marland Kitchen Health Maintenance issues including appropriate healthy diet, exercise, and smoking avoidance were discussed with patient. . See orders for this visit as documented in the electronic medical record. . Patient received an After Visit Summary.  CMA served as Education administrator during this visit. History, Physical, and Plan performed by medical provider. The above documentation has been reviewed and is accurate and complete. Briscoe Deutscher, D.O.  Records requested if needed. Time spent with the patient: 60 minutes, of which >50% was spent in obtaining information about her symptoms, reviewing her previous labs, evaluations, and treatments, counseling her about her condition (please see the discussed topics above), and developing a plan to further investigate it; she had a number of questions which I addressed.   Briscoe Deutscher, DO [place] [place] 05/30/2017  Future Appointments Date Time Provider [place]  08/26/2017 10:45 AM Briscoe Deutscher, DO [facility] None

## 2017-05-30 MED ORDER — LEVOTHYROXINE SODIUM 112 MCG PO TABS: 112.0000 ug | ORAL_TABLET | Freq: Every day | ORAL | 3 refills | Status: DC

## 2017-06-02 ENCOUNTER — Telehealth: Payer: Self-pay | Admitting: Family Medicine

## 2017-06-02 NOTE — Telephone Encounter (Signed)
See result note.  

## 2017-06-02 NOTE — Telephone Encounter (Signed)
Patient called in reference to returning phone call. Please call patient and advise. OK to leave message.

## 2017-06-08 ENCOUNTER — Ambulatory Visit: Payer: Self-pay | Admitting: Sports Medicine

## 2017-06-08 ENCOUNTER — Telehealth: Payer: Self-pay | Admitting: Family Medicine

## 2017-06-08 ENCOUNTER — Encounter: Payer: Self-pay | Admitting: Family Medicine

## 2017-06-08 ENCOUNTER — Ambulatory Visit (INDEPENDENT_AMBULATORY_CARE_PROVIDER_SITE_OTHER): Payer: Medicare Other | Admitting: Family Medicine

## 2017-06-08 VITALS — BP 118/76 | HR 65 | Temp 98.2°F | Wt 298.6 lb

## 2017-06-08 DIAGNOSIS — R3589 Other polyuria: Secondary | ICD-10-CM

## 2017-06-08 DIAGNOSIS — R358 Other polyuria: Secondary | ICD-10-CM | POA: Diagnosis not present

## 2017-06-08 DIAGNOSIS — E119 Type 2 diabetes mellitus without complications: Secondary | ICD-10-CM

## 2017-06-08 DIAGNOSIS — R600 Localized edema: Secondary | ICD-10-CM

## 2017-06-08 DIAGNOSIS — I25708 Atherosclerosis of coronary artery bypass graft(s), unspecified, with other forms of angina pectoris: Secondary | ICD-10-CM

## 2017-06-08 LAB — GLUCOSE, POCT (MANUAL RESULT ENTRY): POC GLUCOSE: 141 mg/dL — AB (ref 70–99)

## 2017-06-08 NOTE — Telephone Encounter (Signed)
Appointment scheduled today for 2:00 with Np Gessner.
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
## 2017-06-08 NOTE — Telephone Encounter (Signed)
Patient called in wanting to speak with a nurse stating sugars are at 304 when she checked it (about 20 mins ago per patient). Patient stated it has been this high since yesterday (06/07/17). Patient also stated she has not had any sweets and she was on steroids a couple of weeks ago and did not know if this could have been a cause. Sent patient to triage. Awaiting note from triage.

## 2017-06-08 NOTE — Patient Instructions (Addendum)
Blood sugar looks better in office. Please let us know if you develop fever, worsening cough or colored sputum.   Take a fluid pill for leg swelling. Drink enough liquids to make urine light yellow.   Avoid starchy or sweet foods for next several days. Eat mostly non starchy vegetables and lean proteins.

## 2017-06-08 NOTE — Telephone Encounter (Signed)
Awaiting triage note.  

## 2017-06-08 NOTE — Telephone Encounter (Signed)
[facility]  Patient Name: [name] [name]  DOB: 06-06-1951    Initial Comment Caller states was seen recently for upper raspatory infection, was talking steroids, yesterday and today her blood sugar was over 300, and feels tired.    Nurse Assessment  Nurse: Holly Bodily, RN, Nicci Date/Time (Eastern Time): 06/08/2017 11:49:41 AM  Confirm and document reason for call. If symptomatic, describe symptoms. ---Caller states was seen recently for upper respiratory infection, was taking steroids a couple weeks ago, yesterday and today her blood sugar was over 300, and feels tired.  Does the patient have any new or worsening symptoms? ---Yes  Will a triage be completed? ---Yes  Related visit to physician within the last 2 weeks? ---No  Does the PT have any chronic conditions? (i.e. diabetes, asthma, etc.) ---Yes  List chronic conditions. ---Diabetes, chronic bronchitis, stent in heart and asthma  Is this a behavioral health or substance abuse call? ---No     Guidelines    Guideline Title Affirmed Question Affirmed Notes  Diabetes - High Blood Sugar [1] Blood glucose > 300 mg/dl (16.7 mmol/l) AND [2] two or more times in a row    Final Disposition User   Call PCP Now [place], RN, Nicci    Referrals  REFERRED TO PCP OFFICE   Disagree/Comply: Comply

## 2017-06-08 NOTE — Telephone Encounter (Signed)
Appointment scheduled.

## 2017-06-17 ENCOUNTER — Telehealth: Payer: Self-pay | Admitting: Family Medicine

## 2017-06-17 NOTE — Telephone Encounter (Signed)
Okay 

## 2017-06-17 NOTE — Telephone Encounter (Signed)
MEDICATION: metFORMIN (GLUCOPHAGE) 500 MG tablet  PHARMACY:   [facility] [place], [place] [phone] (Phone) [phone] (Fax)    IS THIS A 90 DAY SUPPLY : yes  IS PATIENT OUT OF MEDICATION: yes  IF NOT; HOW MUCH IS LEFT: n/a   LAST APPOINTMENT DATE: @9 /17/2018  NEXT APPOINTMENT DATE:@12 /01/2017  OTHER COMMENTS:    **Let patient know to contact pharmacy at the end of the day to make sure medication is ready. **  ** Please notify patient to allow 48-72 hours to process**  **Encourage patient to contact the pharmacy for refills or they can request refills through [facility]**

## 2017-06-17 NOTE — Telephone Encounter (Signed)
Please advise.  Historical provider. 

## 2017-06-18 MED ORDER — METFORMIN HCL 500 MG PO TABS: 500.0000 mg | ORAL_TABLET | Freq: Every day | ORAL | 2 refills | Status: DC

## 2017-06-18 NOTE — Telephone Encounter (Signed)
RX has been sent to pharmacy.  

## 2017-07-23 ENCOUNTER — Ambulatory Visit: Payer: Medicare Other | Admitting: Family Medicine

## 2017-07-24 ENCOUNTER — Encounter: Payer: Self-pay | Admitting: Family Medicine

## 2017-07-24 ENCOUNTER — Ambulatory Visit (INDEPENDENT_AMBULATORY_CARE_PROVIDER_SITE_OTHER): Payer: Medicare Other | Admitting: Family Medicine

## 2017-07-24 VITALS — BP 110/74 | HR 74 | Temp 98.2°F | Ht 65.0 in | Wt 298.4 lb

## 2017-07-24 DIAGNOSIS — T162XXA Foreign body in left ear, initial encounter: Secondary | ICD-10-CM | POA: Diagnosis not present

## 2017-07-24 DIAGNOSIS — Z23 Encounter for immunization: Secondary | ICD-10-CM | POA: Diagnosis not present

## 2017-07-24 DIAGNOSIS — I25708 Atherosclerosis of coronary artery bypass graft(s), unspecified, with other forms of angina pectoris: Secondary | ICD-10-CM | POA: Diagnosis not present

## 2017-07-24 DIAGNOSIS — F419 Anxiety disorder, unspecified: Secondary | ICD-10-CM | POA: Diagnosis not present

## 2017-07-24 MED ORDER — ALPRAZOLAM 0.5 MG PO TABS: 0.5000 mg | ORAL_TABLET | Freq: Two times a day (BID) | ORAL | 0 refills | Status: DC | PRN

## 2017-07-25 NOTE — Progress Notes (Signed)
[name] [name] is a 66 y.o. female here for an acute visit.  History of Present Illness:   HPI:   1. Otalgia. Left ear. For one week. Feels like something is stuck in the ear. No drainage. No fever or URI symptoms. Smoker.    2. Anxiety. Asks for refill of Xanax today. No concerns. Uses prn.     PMHx, SurgHx, SocialHx, Medications, and Allergies were reviewed in the Visit Navigator and updated as appropriate.  Current Medications:   .  albuterol (ACCUNEB) 1.25 MG/3ML nebulizer solution, Take 1 ampule by nebulization 3 (three) times daily as needed for wheezing., Disp: , Rfl:  .  albuterol (PROAIR HFA) 108 (90 Base) MCG/ACT inhaler, Inhale 2 puffs into the lungs every 6 (six) hours as needed for wheezing or shortness of breath., Disp: , Rfl:  .  ALPRAZolam (XANAX) 0.5 MG tablet, Take 1 tablet (0.5 mg total) by mouth 2 (two) times daily as needed for anxiety., Disp: 30 tablet, Rfl: 0 .  aspirin 81 MG tablet, Take 81 mg by mouth daily., Disp: , Rfl:  .  atorvastatin (LIPITOR) 10 MG tablet, Take 1 tablet (10 mg total) by mouth daily., Disp: 30 tablet, Rfl: 10 .  budesonide-formoterol (SYMBICORT) 80-4.5 MCG/ACT inhaler, Inhale 2 puffs into the lungs 2 (two) times daily., Disp: 1 Inhaler, Rfl: 11 .  celecoxib (CELEBREX) 200 MG capsule, Take 200 mg by mouth daily., Disp: , Rfl: 2 .  clopidogrel (PLAVIX) 75 MG tablet, TAKE 1 TABLET BY MOUTH DAILY, Disp: 90 tablet, Rfl: 2 .  furosemide (LASIX) 40 MG tablet, Take 1 tablet (40 mg total) by mouth daily as needed for fluid., Disp: 30 tablet, Rfl: 6 .  gabapentin (NEURONTIN) 300 MG capsule, Take 300 mg by mouth 2 (two) times daily., Disp: , Rfl:  .  INVOKANA 300 MG TABS tablet, Take 1 tablet (300 mg total) by mouth daily., Disp: 30 tablet, Rfl: 2 .  isosorbide mononitrate (IMDUR) 60 MG 24 hr tablet, Take 1 tablet (60 mg total) by mouth daily., Disp: 90 tablet, Rfl: 2 .  levothyroxine (SYNTHROID, LEVOTHROID) 112 MCG tablet, Take 1 tablet (112 mcg total) by  mouth daily., Disp: 90 tablet, Rfl: 3 .  metFORMIN (GLUCOPHAGE) 500 MG tablet, Take 1 tablet (500 mg total) by mouth daily., Disp: 90 tablet, Rfl: 2 .  metoprolol succinate (TOPROL-XL) 50 MG 24 hr tablet, TAKE 1 1/2 TABLETS BY MOUTH EVERY DAY, Disp: 135 tablet, Rfl: 1 .  nitroGLYCERIN (NITROSTAT) 0.4 MG SL tablet, Place 1 tablet (0.4 mg total) under the tongue every 5 (five) minutes as needed for chest pain., Disp: 20 tablet, Rfl: 1   Allergies  Allergen Reactions  . Codeine Hives  . Dilaudid [Hydromorphone Hcl] Nausea And Vomiting  . Iodides   . Reglan [Metoclopramide] Other (See Comments)    Other reaction(s): Dystonia Hallucinations/Violent  . Reglan [Metoclopramide]   . Tramadol   . Dilaudid [Hydromorphone Hcl] Nausea And Vomiting  . Tape Rash    Blisters    Review of Systems:   Pertinent items are noted in the HPI. Otherwise, ROS is negative.  Vitals:   Vitals:   07/24/17 1402  BP: 110/74  Pulse: 74  Temp: 98.2 F (36.8 C)  TempSrc: Oral  SpO2: 92%  Weight: 298 lb 6.4 oz (135.4 kg)  Height: 5\' 5"  (1.651 m)     Body mass index is 49.66 kg/m.   Physical Exam:   Physical Exam  Constitutional: She appears well-nourished.  HENT:  Head: Normocephalic and atraumatic.  Right Ear: Ear canal normal.  Left Ear: A foreign body is present.  Small insect in canal, dead.  Eyes: Pupils are equal, round, and reactive to light. EOM are normal.  Neck: Normal range of motion. Neck supple.  Cardiovascular: Normal rate, regular rhythm, normal heart sounds and intact distal pulses.   Pulmonary/Chest: Effort normal.  Abdominal: Soft.  Skin: Skin is warm.  Psychiatric: She has a normal mood and affect. Her behavior is normal.  Nursing note and vitals reviewed.  Assessment and Plan:   [name] was seen today for ear pain.  Diagnoses and all orders for this visit:  Foreign body of left ear, initial encounter Comments: Lavaged in usual manner without complications. Relief of  symptoms reported.  Anxiety Comments: Okay Xanax, 30 pills. Seems to last several months. Database reviewed and without concerns.  Orders: -     ALPRAZolam (XANAX) 0.5 MG tablet; Take 1 tablet (0.5 mg total) by mouth 2 (two) times daily as needed for anxiety.  Need for immunization against influenza -     Flu Vaccine QUAD 36+ mos IM   . Reviewed expectations re: course of current medical issues. . Discussed self-management of symptoms. . Outlined signs and symptoms indicating need for more acute intervention. . Patient verbalized understanding and all questions were answered. Marland Kitchen Health Maintenance issues including appropriate healthy diet, exercise, and smoking avoidance were discussed with patient. . See orders for this visit as documented in the electronic medical record. . Patient received an After Visit Summary.  Briscoe Deutscher, DO [place] 07/25/2017  Future Appointments Date Time Provider [place]  08/26/2017 10:40 AM Briscoe Deutscher, DO [facility] None

## 2017-08-06 ENCOUNTER — Telehealth: Payer: Self-pay | Admitting: Cardiovascular Disease

## 2017-08-06 NOTE — Telephone Encounter (Signed)
   [facility] Pre-operative Risk Assessment    Request for surgical clearance:  1. What type of surgery is being performed? TOOTH EXTRACTION   2. When is this surgery scheduled? PENDING   3. Are there any medications that need to be held prior to surgery and how long?PLAVIX-NEED DIRECTIONS   4. Practice name and name of physician performing surgery? DR Wyline Beady   5. What is your office phone and fax number? PH=[phone]  FAX=[phone]   6. Anesthesia type (None, local, MAC, general) ? UNKNOWN   [name] [name] 08/06/2017, 10:49 AM  _________________________________________________________________   (provider comments below)

## 2017-08-06 NOTE — Telephone Encounter (Signed)
   Chart reviewed as part of pre-operative protocol coverage. Given past medical history, and based on ACC/AHA guidelines, Lumina Hilgers would be at acceptable risk for the planned procedure without further cardiovascular testing. It is recommended she stop Plavix 5 days prior to her dental extraction. She will need to continue aspirin 81 mg daily throughout the peri-procedure timeframe. We ask that the provider performing the extraction advised the patient to restart the Plavix as soon as felt safe from their perspective.    I will route this recommendation to the requesting party via Epic fax function and remove from pre-op pool.  Please call with questions.  Christell Faith, PA-C 08/06/2017, 12:29 PM

## 2017-08-06 NOTE — Telephone Encounter (Signed)
Office called dr Wynetta Emery .

## 2017-08-10 ENCOUNTER — Ambulatory Visit (INDEPENDENT_AMBULATORY_CARE_PROVIDER_SITE_OTHER): Payer: Medicare Other | Admitting: Family Medicine

## 2017-08-10 ENCOUNTER — Encounter: Payer: Self-pay | Admitting: Family Medicine

## 2017-08-10 VITALS — BP 100/80 | HR 69 | Temp 98.2°F | Ht 65.0 in | Wt 300.6 lb

## 2017-08-10 DIAGNOSIS — R51 Headache: Secondary | ICD-10-CM | POA: Diagnosis not present

## 2017-08-10 DIAGNOSIS — R519 Headache, unspecified: Secondary | ICD-10-CM

## 2017-08-10 MED ORDER — PROMETHAZINE HCL 25 MG/ML IJ SOLN: 25.0000 mg | Freq: Once | INTRAMUSCULAR | Status: DC

## 2017-08-10 MED ORDER — METHYLPREDNISOLONE ACETATE 80 MG/ML IJ SUSP
80.0000 mg | Freq: Once | INTRAMUSCULAR | Status: AC
  Administered 2017-08-10 (×2): 80 mg via INTRAMUSCULAR

## 2017-08-10 NOTE — Progress Notes (Signed)
    Subjective:  [name] [name] is a 66 y.o. female who presents today with a chief complaint of headache, acute issue.   HPI:  Headache, acute issue Symptoms started about a week ago.  Have been stable over that time.  [name] is intermittent in nature.  She does not have any clear precipitating events.  No clear aggravating factors.  [name] is located at her bilateral temples and then moves to a more frontal distribution.  No vision changes with the [name].  She has taken Tylenol which helps some.  Is also tried hydrocodone which helps a little bit as well.  She has had mild nausea and a couple episodes of vomiting with severe [name].  Currently her [name] is mild and is described as a "dull sensation".  No new weakness or numbness.  She was diagnosed with dental abscesses within the past week and will be undergoing extraction soon.  She was given clindamycin for this.  No fevers.  ROS: Per HPI  PMH: Smoking history reviewed.  Current smoker.  Objective:  Physical Exam: BP 100/80   Pulse 69   Temp 98.2 F (36.8 C) (Oral)   Ht 5\' 5"  (1.651 m)   Wt (!) 300 lb 9.6 oz (136.4 kg)   SpO2 97%   BMI 50.02 kg/m   Gen: NAD, resting comfortably HEENT: Moist mucous membranes, poor dentition.  TMs clear.  Oropharynx clear.  No lymphadenopathy.  No temporal tenderness. CV: RRR with no murmurs appreciated Pulm: NWOB, CTAB with no crackles, wheezes, or rhonchi MSK: No edema, cyanosis, or clubbing noted Skin: Warm, dry Neuro: Cranial nerves II through XII intact.  Mild weakness in right upper extremity (chronic per patient).  Reflexes 2+ and symmetric bilaterally. Psych: Normal affect and thought content  Assessment/Plan:  Headache, acute issue Most likely secondary to dental inflammation.  She does have some chronic weakness in her right upper extremity related to a remote fall 7 years ago, however the rest of her neurological exam is normal.  She does not have a history of trauma or other obvious  precipitating event.  Her exam is not consistent with temporal arteritis.  Given that her symptoms have waxed and waned for 1 week and are currently mild, this is unlikely to be an intracranial bleed.  This diagnostic possibility was discussed with patient.  Patient was informed that this cannot be completely ruled out with a head CT.  Obtaining head CT was deferred after discussing risks and benefits.  Patient will be giving a headache cocktail consisting of IM steroids and Phenergan.  She is not a candidate for Toradol given that she has already taken Celebrex for today.  Gave her strict instruction to patient to let us know if her symptoms persist after receiving headache cocktail.  Also discussed reasons to go to the emergency room including severe [name], vomiting, weakness, numbness, and fever.  If symptoms do not improve with migraine cocktail, would consider stat head CT or advised patient to go directly to the emergency room.  [name] [name]. [name] Pain, MD 08/10/2017 12:48 PM

## 2017-08-10 NOTE — Patient Instructions (Signed)
Please let us know if the injections do not work  Please also let us know if your headache worsens.  We will need to get a head CT scan in that case.  Take care, Dr. Jerline Pain

## 2017-08-11 ENCOUNTER — Telehealth: Payer: Self-pay | Admitting: Family Medicine

## 2017-08-11 ENCOUNTER — Encounter: Payer: Self-pay | Admitting: Family Medicine

## 2017-08-11 NOTE — Telephone Encounter (Signed)
Pt is aware via voicemail of annotations.

## 2017-08-11 NOTE — Telephone Encounter (Signed)
Patient called in reference to letting Dr. Jerline Pain know after her visit on 08/11/17, she only has a "low grade headache" and patient would like to know if she needs to go back on the "Clindamycin". Please call patient and advise.

## 2017-08-11 NOTE — Telephone Encounter (Signed)
The clindamycin should help with her dental infection, so I think she should be back on it. If her headache worsens again or if she starts developing other symptoms (fever, weakness, numbness, nausea/vomiting) she needs to go to the emergency room for further evaluation.  Algis Greenhouse. Jerline Pain, MD 08/11/2017 3:09 PM

## 2017-08-17 ENCOUNTER — Other Ambulatory Visit: Payer: Self-pay | Admitting: Family Medicine

## 2017-08-17 ENCOUNTER — Other Ambulatory Visit: Payer: Self-pay | Admitting: Cardiovascular Disease

## 2017-08-17 MED ORDER — INVOKANA 300 MG PO TABS: 300.0000 mg | ORAL_TABLET | Freq: Every day | ORAL | 2 refills | Status: DC

## 2017-08-17 NOTE — Telephone Encounter (Signed)
RX sent to pharmacy  

## 2017-08-17 NOTE — Telephone Encounter (Signed)
MEDICATION: INVOKANA 300 MG TABS tablet  PHARMACY:   [facility] [place], [name] [phone] (Phone) [phone] (Fax)     IS THIS A 90 DAY SUPPLY : same as last time  IS PATIENT OUT OF MEDICATION: yes  IF NOT; HOW MUCH IS LEFT: n/a  LAST APPOINTMENT DATE: @11 /19/2018  NEXT APPOINTMENT DATE:@12 /01/2017  OTHER COMMENTS:    **Let patient know to contact pharmacy at the end of the day to make sure medication is ready. **  ** Please notify patient to allow 48-72 hours to process**  **Encourage patient to contact the pharmacy for refills or they can request refills through [facility]**

## 2017-08-18 NOTE — Telephone Encounter (Signed)
REFILL 

## 2017-08-20 ENCOUNTER — Ambulatory Visit: Payer: Self-pay | Admitting: *Deleted

## 2017-08-20 ENCOUNTER — Telehealth: Payer: Self-pay | Admitting: Family Medicine

## 2017-08-20 NOTE — Telephone Encounter (Signed)
Copied from [place] [id number]. Topic: Quick Communication - See Telephone Encounter >> Aug 20, 2017  2:50 PM Hewitt Shorts wrote: CRM for notification. See Telephone encounter for: pt is wanting to have something called in for a UTI she is showing the symptoms and pressure at the lower part of her stomach  Best number [phone]   08/20/17.

## 2017-08-20 NOTE — Telephone Encounter (Signed)
Needs visit with UA.

## 2017-08-20 NOTE — Telephone Encounter (Signed)
  Reason for Disposition . Urinating more frequently than usual (i.e., frequency)  Answer Assessment - Initial Assessment Questions 1. SYMPTOM: "What's the main symptom you're concerned about?" (e.g., frequency, incontinence)     Feels pressure with urinating, feels like she can't empty all the way 2. ONSET: "When did the  ________  start?"     Last night 3. PAIN: "Is there any pain?" If so, ask: "How bad is it?" (Scale: 1-10; mild, moderate, severe)     Just pressure 4. CAUSE: "What do you think is causing the symptoms?"     UTI 5. OTHER SYMPTOMS: "Do you have any other symptoms?" (e.g., fever, flank pain, blood in urine, pain with urination)     No 6. PREGNANCY: "Is there any chance you are pregnant?" "When was your last menstrual period?"     n/a  Protocols used: URINARY SYMPTOMS-A-AH  Pt has complaints of urinary frequency,pressure and feeling like she is unable to empty bladder fully. Pt state she has had UTIs in the past and is having the same feeling now. Pt asking if an antibiotic could be called in for treatment. Pt states she can not take Clindamycin.

## 2017-08-20 NOTE — Telephone Encounter (Signed)
See nurse triage

## 2017-08-21 NOTE — Telephone Encounter (Signed)
Spoke with patient and she does not want to come in for a visit. She has an appointment on Wednesday of next week. If she gets worse she will go to Urgent care.

## 2017-08-25 ENCOUNTER — Ambulatory Visit (INDEPENDENT_AMBULATORY_CARE_PROVIDER_SITE_OTHER): Payer: Medicare Other | Admitting: Family Medicine

## 2017-08-25 ENCOUNTER — Encounter: Payer: Self-pay | Admitting: Family Medicine

## 2017-08-25 VITALS — BP 124/86 | HR 62 | Temp 98.6°F | Wt 295.6 lb

## 2017-08-25 DIAGNOSIS — E039 Hypothyroidism, unspecified: Secondary | ICD-10-CM | POA: Diagnosis not present

## 2017-08-25 DIAGNOSIS — J449 Chronic obstructive pulmonary disease, unspecified: Secondary | ICD-10-CM

## 2017-08-25 DIAGNOSIS — E119 Type 2 diabetes mellitus without complications: Secondary | ICD-10-CM | POA: Diagnosis not present

## 2017-08-25 DIAGNOSIS — I25708 Atherosclerosis of coronary artery bypass graft(s), unspecified, with other forms of angina pectoris: Secondary | ICD-10-CM

## 2017-08-25 DIAGNOSIS — F17210 Nicotine dependence, cigarettes, uncomplicated: Secondary | ICD-10-CM

## 2017-08-25 DIAGNOSIS — E782 Mixed hyperlipidemia: Secondary | ICD-10-CM

## 2017-08-25 LAB — URINALYSIS, ROUTINE W REFLEX MICROSCOPIC
Bilirubin Urine: NEGATIVE
Hgb urine dipstick: NEGATIVE
Ketones, ur: NEGATIVE
Leukocytes, UA: NEGATIVE
Nitrite: POSITIVE — AB
RBC / HPF: NONE SEEN (ref 0–?)
Specific Gravity, Urine: >=1.03 — AB (ref 1.000–1.030)
Total Protein, Urine: NEGATIVE
Urine Glucose: >=1000 — AB
Urobilinogen, UA: 0.2 (ref 0.0–1.0)
pH: 6 (ref 5.0–8.0)

## 2017-08-25 MED ORDER — NITROFURANTOIN MONOHYD MACRO 100 MG PO CAPS: 100.0000 mg | ORAL_CAPSULE | Freq: Two times a day (BID) | ORAL | 0 refills | Status: DC

## 2017-08-25 NOTE — Addendum Note (Signed)
Addended by: Briscoe Deutscher R on: 08/25/2017 04:43 PM   Modules accepted: Orders

## 2017-08-25 NOTE — Progress Notes (Addendum)
[name] [name] is a 66 y.o. female is here for follow up.  History of Present Illness:   HPI: See Assessment and Plan section for Problem Based Charting of issues discussed today.  There are no preventive care reminders to display for this patient. Depression screen [facility] 2/9 08/25/2017 11/08/2016  Decreased Interest 0 1  Down, Depressed, Hopeless 1 0  PHQ - 2 Score 1 1  Altered sleeping 1 -  Tired, decreased energy 0 -  Change in appetite 1 -  Feeling bad or failure about yourself  0 -  Trouble concentrating 0 -  Moving slowly or fidgety/restless 0 -  Suicidal thoughts 0 -  PHQ-9 Score 3 -  Difficult doing work/chores Somewhat difficult -   PMHx, SurgHx, SocialHx, FamHx, Medications, and Allergies were reviewed in the Visit Navigator and updated as appropriate.   Patient Active Problem List   Diagnosis Date Noted  . Sinusitis 05/27/2017  . Anxiety 05/27/2017  . Mild obstructive sleep apnea-hypopnea syndrome 01/28/2017  . Diabetes mellitus without complication ([place])   . Coronary artery disease   . Bronchitis, asthmatic   . MI, old   . Hypothyroidism   . COPD GOLD 0/ prob AB still smoking  11/07/2016  . Goiter 10/31/2016  . S/P coronary artery stent placement 04/16/2016  . Hypertensive heart disease 04/16/2016  . Hyperlipidemia 04/16/2016  . Nicotine dependence 04/16/2016  . Morbid (severe) obesity due to excess calories ([place]) 04/16/2016   Social History   Tobacco Use  . Smoking status: Current Every Day Smoker    Packs/day: 0.50    Years: 31.00    Pack years: 15.50  . Smokeless tobacco: Never Used  Substance Use Topics  . Alcohol use: No  . Drug use: No   Current Medications and Allergies:   .  albuterol (ACCUNEB) 1.25 MG/3ML nebulizer solution, Take 1 ampule by nebulization 3 (three) times daily as needed for wheezing., Disp: , Rfl:  .  albuterol (PROAIR HFA) 108 (90 Base) MCG/ACT inhaler, Inhale 2 puffs into the lungs every 6 (six) hours as needed for wheezing or  shortness of breath., Disp: , Rfl:  .  ALPRAZolam (XANAX) 0.5 MG tablet, Take 1 tablet (0.5 mg total) by mouth 2 (two) times daily as needed for anxiety., Disp: 30 tablet, Rfl: 0 .  aspirin 81 MG tablet, Take 81 mg by mouth daily., Disp: , Rfl:  .  atorvastatin (LIPITOR) 10 MG tablet, TAKE 1 TABLET(10 MG) BY MOUTH DAILY, Disp: 30 tablet, Rfl: 4 .  budesonide-formoterol (SYMBICORT) 80-4.5 MCG/ACT inhaler, Inhale 2 puffs into the lungs 2 (two) times daily., Disp: 1 Inhaler, Rfl: 11 .  celecoxib (CELEBREX) 200 MG capsule, Take 200 mg by mouth daily., Disp: , Rfl: 2 .  clopidogrel (PLAVIX) 75 MG tablet, TAKE 1 TABLET BY MOUTH DAILY, Disp: 90 tablet, Rfl: 2 .  furosemide (LASIX) 40 MG tablet, Take 1 tablet (40 mg total) by mouth daily as needed for fluid., Disp: 30 tablet, Rfl: 6 .  gabapentin (NEURONTIN) 300 MG capsule, Take 300 mg by mouth 2 (two) times daily., Disp: , Rfl:  .  INVOKANA 300 MG TABS tablet, Take 1 tablet (300 mg total) by mouth daily., Disp: 30 tablet, Rfl: 2 .  isosorbide mononitrate (IMDUR) 60 MG 24 hr tablet, Take 1 tablet (60 mg total) by mouth daily., Disp: 90 tablet, Rfl: 2 .  levothyroxine (SYNTHROID, LEVOTHROID) 112 MCG tablet, Take 1 tablet (112 mcg total) by mouth daily., Disp: 90 tablet, Rfl: 3 .  metFORMIN (GLUCOPHAGE) 500 MG tablet, Take 1 tablet (500 mg total) by mouth daily., Disp: 90 tablet, Rfl: 2 .  metoprolol succinate (TOPROL-XL) 50 MG 24 hr tablet, TAKE 1 1/2 TABLETS BY MOUTH EVERY DAY, Disp: 135 tablet, Rfl: 1 .  nitroGLYCERIN (NITROSTAT) 0.4 MG SL tablet, Place 1 tablet (0.4 mg total) under the tongue every 5 (five) minutes as needed for chest pain., Disp: 20 tablet, Rfl: 1    Allergies  Allergen Reactions  . Clindamycin/Lincomycin Other (See Comments)    Head ache   . Codeine Hives  . Dilaudid [Hydromorphone Hcl] Nausea And Vomiting  . Iodides   . Reglan [Metoclopramide] Other (See Comments)    Other reaction(s): Dystonia Hallucinations/Violent  .  Reglan [Metoclopramide]   . Tramadol   . Dilaudid [Hydromorphone Hcl] Nausea And Vomiting  . Tape Rash    Blisters    Review of Systems   Pertinent items are noted in the HPI. Otherwise, ROS is negative.  Vitals:   Vitals:   08/25/17 1307  BP: 124/86  Pulse: 62  Temp: 98.6 F (37 C)  TempSrc: Oral  SpO2: 93%  Weight: 295 lb 9.6 oz (134.1 kg)     Body mass index is 49.19 kg/m.   Physical Exam:   Physical Exam  Constitutional: She is oriented to person, place, and time. She appears well-developed and well-nourished. No distress.  HENT:  Head: Normocephalic and atraumatic.  Right Ear: External ear normal.  Left Ear: External ear normal.  Nose: Nose normal.  Mouth/Throat: Oropharynx is clear and moist.  Eyes: Conjunctivae and EOM are normal. Pupils are equal, round, and reactive to light.  Neck: Normal range of motion. Neck supple.  Cardiovascular: Normal rate, regular rhythm, normal heart sounds and intact distal pulses.  Pulmonary/Chest: Effort normal. She has decreased breath sounds. She has no wheezes. She has no rhonchi. She has no rales.  Abdominal: Soft. Bowel sounds are normal.  Neurological: She is alert and oriented to person, place, and time.  Skin: Skin is warm.  Psychiatric: She has a normal mood and affect. Her behavior is normal.  Nursing note and vitals reviewed.   Assessment and Plan:   [name] [name] was seen today for follow-up.  Diagnoses and all orders for this visit:  Diabetes mellitus without complication ([place]) Comments: Current symptoms: no polyuria or polydipsia, no numbness, tingling or pain in extremities.  Taking medication compliantly without noted sided effects [x]   YES  []   NO  Episodes of hypoglycemia? []   YES  [x]   NO Maintaining a diabetic diet? []   YES  [x]   NO Trying to exercise on a regular basis? []   YES  [x]   NO  On ACE inhibitor or angiotensin II receptor blocker? []   YES  [x]   NO On Aspirin? [x]   YES  []   NO  Lab Results    Component Value Date   HGBA1C 7.3 (H) 05/27/2017    Lab Results  Component Value Date   MICROALBUR <0.7 12/09/2016    Lab Results  Component Value Date   CHOL 133 11/27/2016   HDL 39.00 (L) 11/27/2016   LDLCALC 46 10/31/2016   LDLDIRECT 57.0 11/27/2016   TRIG 259.0 (H) 11/27/2016   CHOLHDL 3 11/27/2016     Wt Readings from Last 3 Encounters:  08/25/17 295 lb 9.6 oz (134.1 kg)  08/10/17 (!) 300 lb 9.6 oz (136.4 kg)  07/24/17 298 lb 6.4 oz (135.4 kg)   BP Readings from Last 3 Encounters:  08/25/17 124/86  08/10/17 100/80  07/24/17 110/74   Lab Results  Component Value Date   CREATININE 0.90 05/27/2017    Orders: -     Comprehensive metabolic panel; Future -     Hemoglobin A1c; Future -     Urinalysis, Routine w reflex microscopic -     Uric acid; Future  Acquired hypothyroidism Comments: Patient's levothyroxine was adjusted at the last visit.  She is due for a recheck now. Endocrine ROS: negative for - hot flashes, mood swings, palpitations, skin changes or unexpected weight changes. Orders: -     T4, free; Future -     TSH; Future  COPD GOLD 0/ prob AB still smoking  Comments: As below, patient is unmotivated to quit.  She has been seen by pulmonology.  She does not qualify for oxygen.  She does admit to increased shortness of breath with exertion.  She also admits that she has not been using her Symbicort as directed.  We discussed using this as prescribed as a maintenance medication. Orders: -     CBC with Differential/Platelet; Future -     Comprehensive metabolic panel; Future  Mixed hyperlipidemia Comments: Is the patient taking medications without problems? [x]   YES  []   NO Does the patient complain of muscle aches?   []   YES  [x]    NO Trying to exercise on a regular basis? []   YES  [x]   NO Diet Compliance: noncompliant much of the time. Concerns: none. Cardiovascular ROS: negative for - chest pain or irregular heartbeat.   Lipids:    Component  Value Date/Time   CHOL 133 11/27/2016 1605   TRIG 259.0 (H) 11/27/2016 1605   HDL 39.00 (L) 11/27/2016 1605   LDLDIRECT 57.0 11/27/2016 1605   VLDL 51.8 (H) 11/27/2016 1605   CHOLHDL 3 11/27/2016 1605   Orders: -     Lipid panel; Future  Smoker Comments: Patient is unmotivated to quit at this point. The patient was counseled on the dangers of tobacco use, and was advised to quit.  Reviewed strategies to maximize success, including removing cigarettes and smoking materials from environment, stress management, support of family/friends, written materials, local smoking cessation programs (1-800-QUIT-NOW and SMOKEFREE[employer]GOV) and pharmacotherapy (nicotine patches). Greater than 3 minutes were spent on counseling today.   . Reviewed expectations re: course of current medical issues. . Discussed self-management of symptoms. . Outlined signs and symptoms indicating need for more acute intervention. . Patient verbalized understanding and all questions were answered. Marland Kitchen Health Maintenance issues including appropriate healthy diet, exercise, and smoking avoidance were discussed with patient. . See orders for this visit as documented in the electronic medical record. . Patient received an After Visit Summary.  Briscoe Deutscher, DO [place], [place] 08/25/2017  Future Appointments  Date Time Provider [place]  08/28/2017 10:00 AM [facility]

## 2017-08-26 ENCOUNTER — Other Ambulatory Visit (INDEPENDENT_AMBULATORY_CARE_PROVIDER_SITE_OTHER): Payer: Medicare Other

## 2017-08-26 ENCOUNTER — Other Ambulatory Visit: Payer: Self-pay | Admitting: Family Medicine

## 2017-08-26 ENCOUNTER — Ambulatory Visit: Payer: Medicare Other | Admitting: Family Medicine

## 2017-08-26 DIAGNOSIS — E782 Mixed hyperlipidemia: Secondary | ICD-10-CM | POA: Diagnosis not present

## 2017-08-26 DIAGNOSIS — E039 Hypothyroidism, unspecified: Secondary | ICD-10-CM

## 2017-08-26 DIAGNOSIS — E119 Type 2 diabetes mellitus without complications: Secondary | ICD-10-CM

## 2017-08-26 DIAGNOSIS — J449 Chronic obstructive pulmonary disease, unspecified: Secondary | ICD-10-CM

## 2017-08-26 DIAGNOSIS — F17210 Nicotine dependence, cigarettes, uncomplicated: Secondary | ICD-10-CM

## 2017-08-26 LAB — URIC ACID: Uric Acid, Serum: 4.9 mg/dL (ref 2.4–7.0)

## 2017-08-26 NOTE — Telephone Encounter (Signed)
Copied from [place] [id number]. Topic: Quick Communication - Rx Refill/Question >> Aug 26, 2017  1:22 PM Robina Ade, Helene Kelp D wrote: Has the patient contacted their pharmacy?No (Agent: If no, request that the patient contact the pharmacy for the refill.) Preferred Pharmacy (with phone number or street name): [facility] [place], [place] Agent: Please be advised that RX refills may take up to 3 business days. We ask that you follow-up with your pharmacy. Patient would like a new rx called in for celecoxib (CELEBREX) 200 MG capsule. Please call patient when this is done, thanks.

## 2017-08-27 ENCOUNTER — Other Ambulatory Visit: Payer: Self-pay

## 2017-08-27 NOTE — Telephone Encounter (Signed)
Please advise on refill.  You have not prescribed this before.

## 2017-08-27 NOTE — Telephone Encounter (Signed)
Okay refill. 

## 2017-08-28 ENCOUNTER — Other Ambulatory Visit (INDEPENDENT_AMBULATORY_CARE_PROVIDER_SITE_OTHER): Payer: Medicare Other

## 2017-08-28 ENCOUNTER — Telehealth: Payer: Self-pay

## 2017-08-28 DIAGNOSIS — E782 Mixed hyperlipidemia: Secondary | ICD-10-CM | POA: Diagnosis not present

## 2017-08-28 DIAGNOSIS — F17210 Nicotine dependence, cigarettes, uncomplicated: Secondary | ICD-10-CM | POA: Diagnosis not present

## 2017-08-28 DIAGNOSIS — E119 Type 2 diabetes mellitus without complications: Secondary | ICD-10-CM | POA: Diagnosis not present

## 2017-08-28 DIAGNOSIS — E039 Hypothyroidism, unspecified: Secondary | ICD-10-CM | POA: Diagnosis not present

## 2017-08-28 DIAGNOSIS — J449 Chronic obstructive pulmonary disease, unspecified: Secondary | ICD-10-CM

## 2017-08-28 DIAGNOSIS — D751 Secondary polycythemia: Secondary | ICD-10-CM

## 2017-08-28 LAB — CBC WITH DIFFERENTIAL/PLATELET
Basophils Absolute: 0 10*3/uL (ref 0.0–0.1)
Basophils Relative: 0.6 % (ref 0.0–3.0)
Eosinophils Absolute: 0.1 10*3/uL (ref 0.0–0.7)
Eosinophils Relative: 1.6 % (ref 0.0–5.0)
HCT: 58.4 % — ABNORMAL HIGH (ref 36.0–46.0)
Hemoglobin: 19.1 g/dL (ref 12.0–15.0)
Lymphocytes Relative: 29.7 % (ref 12.0–46.0)
Lymphs Abs: 2.2 10*3/uL (ref 0.7–4.0)
MCHC: 32.6 g/dL (ref 30.0–36.0)
MCV: 97.3 fl (ref 78.0–100.0)
Monocytes Absolute: 0.7 10*3/uL (ref 0.1–1.0)
Monocytes Relative: 9 % (ref 3.0–12.0)
Neutro Abs: 4.4 10*3/uL (ref 1.4–7.7)
Neutrophils Relative %: 59.1 % (ref 43.0–77.0)
Platelets: 165 10*3/uL (ref 150.0–400.0)
RBC: 6 Mil/uL — ABNORMAL HIGH (ref 3.87–5.11)
RDW: 15.5 % (ref 11.5–15.5)
WBC: 7.4 10*3/uL (ref 4.0–10.5)

## 2017-08-28 LAB — COMPREHENSIVE METABOLIC PANEL
ALT: 13 U/L (ref 0–35)
AST: 13 U/L (ref 0–37)
Albumin: 4.2 g/dL (ref 3.5–5.2)
Alkaline Phosphatase: 91 U/L (ref 39–117)
BUN: 19 mg/dL (ref 6–23)
CO2: 32 mEq/L (ref 19–32)
Calcium: 9.6 mg/dL (ref 8.4–10.5)
Chloride: 100 mEq/L (ref 96–112)
Creatinine, Ser: 0.86 mg/dL (ref 0.40–1.20)
GFR: 70.56 mL/min (ref >60.00)
Glucose, Bld: 117 mg/dL — ABNORMAL HIGH (ref 70–99)
Potassium: 3.8 mEq/L (ref 3.5–5.1)
Sodium: 140 mEq/L (ref 135–145)
Total Bilirubin: 1.1 mg/dL (ref 0.2–1.2)
Total Protein: 7 g/dL (ref 6.0–8.3)

## 2017-08-28 LAB — LIPID PANEL
Cholesterol: 115 mg/dL (ref 0–200)
HDL: 46.2 mg/dL (ref >39.00)
LDL Cholesterol: 44 mg/dL (ref 0–99)
NonHDL: 68.54
Total CHOL/HDL Ratio: 2
Triglycerides: 124 mg/dL (ref 0.0–149.0)
VLDL: 24.8 mg/dL (ref 0.0–40.0)

## 2017-08-28 LAB — T4, FREE: Free T4: 1.22 ng/dL (ref 0.60–1.60)

## 2017-08-28 LAB — TSH: TSH: 0.35 u[IU]/mL (ref 0.35–4.50)

## 2017-08-28 LAB — HEMOGLOBIN A1C: Hgb A1c MFr Bld: 7.2 % — ABNORMAL HIGH (ref 4.6–6.5)

## 2017-08-28 MED ORDER — CELECOXIB 200 MG PO CAPS: 200.0000 mg | ORAL_CAPSULE | Freq: Every day | ORAL | 2 refills | Status: DC

## 2017-08-28 NOTE — Telephone Encounter (Signed)
[facility] lab called with critical result on pt. Critical high Hgb of 19.10

## 2017-08-28 NOTE — Telephone Encounter (Signed)
Informed husband on DPR that script has been sent in.

## 2017-08-31 NOTE — Addendum Note (Signed)
Addended by: Briscoe Deutscher R on: 08/31/2017 03:12 PM   Modules accepted: Orders

## 2017-09-01 NOTE — Telephone Encounter (Signed)
Referral to Heme in chart. Urgent.

## 2017-09-02 ENCOUNTER — Telehealth: Payer: Self-pay | Admitting: Family Medicine

## 2017-09-02 NOTE — Telephone Encounter (Signed)
Copied from [place]. Topic: Quick Communication - Lab Results >> Sep 02, 2017  1:37 PM Cecelia Byars, NT wrote: {CHL AMB CRM LAB RESULTS:[id number]:::0 Please call patient with lab results [phone]

## 2017-09-02 NOTE — Telephone Encounter (Signed)
Please call patient to give lab results.

## 2017-09-03 ENCOUNTER — Telehealth: Payer: Self-pay

## 2017-09-03 DIAGNOSIS — F419 Anxiety disorder, unspecified: Secondary | ICD-10-CM

## 2017-09-03 NOTE — Telephone Encounter (Signed)
Patient would like her Xanax increased to BID.  She said she thought that was going to be done after her last visit.  Ok to send in?

## 2017-09-03 NOTE — Telephone Encounter (Signed)
Spoke with patient and gave results.  Patient verbalized understanding and will expect hematology referral.

## 2017-09-03 NOTE — Telephone Encounter (Signed)
Patient is correct. Looks like I did not give enough for BID dosing. Okay to refill correctly.

## 2017-09-03 NOTE — Telephone Encounter (Signed)
Left message to return call to our office.  

## 2017-09-04 ENCOUNTER — Telehealth: Payer: Self-pay | Admitting: Hematology and Oncology

## 2017-09-04 MED ORDER — ALPRAZOLAM 0.5 MG PO TABS: 0.5000 mg | ORAL_TABLET | Freq: Two times a day (BID) | ORAL | 0 refills | Status: DC | PRN

## 2017-09-04 NOTE — Telephone Encounter (Signed)
Spoke with patient re new patient appointment with [name] 1/17. Demographic/insurane information confirmed. Patient given date/time/location.

## 2017-09-04 NOTE — Telephone Encounter (Signed)
Prescription sent to the pharmacy. Notified patient.  

## 2017-09-11 ENCOUNTER — Other Ambulatory Visit: Payer: Self-pay

## 2017-09-11 ENCOUNTER — Other Ambulatory Visit: Payer: Self-pay | Admitting: Cardiovascular Disease

## 2017-09-11 MED ORDER — METOPROLOL SUCCINATE ER 50 MG PO TB24: ORAL_TABLET | ORAL | 0 refills | Status: DC

## 2017-09-11 NOTE — Telephone Encounter (Signed)
Please review for refill, Thanks !  

## 2017-10-01 ENCOUNTER — Encounter: Payer: Self-pay | Admitting: Family Medicine

## 2017-10-01 ENCOUNTER — Ambulatory Visit (INDEPENDENT_AMBULATORY_CARE_PROVIDER_SITE_OTHER): Payer: Medicare Other | Admitting: Family Medicine

## 2017-10-01 VITALS — BP 122/76 | HR 65 | Temp 98.3°F | Ht 65.0 in | Wt 300.0 lb

## 2017-10-01 DIAGNOSIS — R05 Cough: Secondary | ICD-10-CM | POA: Diagnosis not present

## 2017-10-01 DIAGNOSIS — R059 Cough, unspecified: Secondary | ICD-10-CM

## 2017-10-01 DIAGNOSIS — J329 Chronic sinusitis, unspecified: Secondary | ICD-10-CM | POA: Diagnosis not present

## 2017-10-01 MED ORDER — IPRATROPIUM BROMIDE 0.06 % NA SOLN: 2.0000 | Freq: Four times a day (QID) | NASAL | 0 refills | Status: DC

## 2017-10-01 MED ORDER — METHYLPREDNISOLONE ACETATE 80 MG/ML IJ SUSP
80.0000 mg | Freq: Once | INTRAMUSCULAR | Status: AC
  Administered 2017-10-01: 80 mg via INTRAMUSCULAR

## 2017-10-01 MED ORDER — AZITHROMYCIN 250 MG PO TABS: ORAL_TABLET | ORAL | 0 refills | Status: DC

## 2017-10-01 MED ORDER — BENZONATATE 200 MG PO CAPS: 200.0000 mg | ORAL_CAPSULE | Freq: Two times a day (BID) | ORAL | 0 refills | Status: DC | PRN

## 2017-10-01 NOTE — Progress Notes (Signed)
    Subjective:  [name] [name] is a 67 y.o. female who presents today with a chief complaint of sinus congestion.   HPI:  Sinus congestion, acute issue Symptoms started a few days ago. Worse over the last few days. Associated with some facial pressure, cough, and sneeze.  Also with some wheezing.  Cough is productive of clear sputum.  No shortness of breath.  She has been cutting back on her smoking.  She has not tried any home treatments.  She has not noticed any other obvious alleviating or aggravating factors.  Husband has been sick with similar symptoms.  ROS: Per HPI  PMH: She reports that she has been smoking.  She has a 15.50 pack-year smoking history. she has never used smokeless tobacco. She reports that she does not drink alcohol or use drugs.  Objective:  Physical Exam: BP 122/76 (BP Location: Left Arm, Patient Position: Sitting, Cuff Size: Large)   Pulse 65   Temp 98.3 F (36.8 C) (Oral)   Ht 5\' 5"  (1.651 m)   Wt 300 lb (136.1 kg)   SpO2 95%   BMI 49.92 kg/m   Gen: NAD, resting comfortably HEENT: TMs clear.  Nasal mucosa erythematous and boggy with clear nasal discharge.  Maxillary sinuses with decreased transillumination bilaterally.  Oropharynx with cobblestoning and erythema.  No lymphadenopathy. CV: RRR with no murmurs appreciated Pulm: NWOB, CTAB with no crackles, wheezes, or rhonchi  Assessment/Plan:  Cough Sinusitis Likely secondary to viral upper respiratory infection.  She does not have any signs of bacterial infection.  She reports mild wheeze however this was not appreciated on exam today.  No signs of COPD exacerbation.  We will start Atrovent for her rhinorrhea.  We will also give 80 mg IM Depo-Medrol for her sore throat-advised patient to keep close eye on her blood sugars.  Sent in a prescription for Tessalon for her cough.  Sent in a "pocket prescription" for azithromycin with strict instruction to not start unless symptoms worsen or fail to improve within the  next few days.  Encouraged good oral hydration.  Recommended Tylenol as needed for low-grade fever and pain.  Return precautions reviewed.  Follow-up as needed.  Algis Greenhouse. Jerline Pain, MD 10/01/2017 1:45 PM

## 2017-10-01 NOTE — Patient Instructions (Signed)
Start the Atrovent.  Use the Tessalon as needed.  If your symptoms are not improving in the next few days or if they worsen, please start the azithromycin.  Please stay well-hydrated.  You can use Tylenol as needed for low-grade fever and pain.  Please let us know if your symptoms worsen or fail to improve.  Take care, Dr. Jerline Pain

## 2017-10-01 NOTE — Addendum Note (Signed)
Addended by: Elmer Bales on: 10/01/2017 02:09 PM   Modules accepted: Orders

## 2017-10-02 LAB — HM DIABETES EYE EXAM

## 2017-10-08 ENCOUNTER — Encounter: Payer: Self-pay | Admitting: Hematology and Oncology

## 2017-10-08 ENCOUNTER — Inpatient Hospital Stay: Payer: Medicare Other

## 2017-10-08 ENCOUNTER — Inpatient Hospital Stay: Payer: Medicare Other | Attending: Hematology and Oncology | Admitting: Hematology and Oncology

## 2017-10-08 ENCOUNTER — Telehealth: Payer: Self-pay | Admitting: Hematology and Oncology

## 2017-10-08 VITALS — BP 114/66 | HR 72 | Temp 98.8°F | Resp 22 | Wt 295.8 lb

## 2017-10-08 DIAGNOSIS — Z90710 Acquired absence of both cervix and uterus: Secondary | ICD-10-CM | POA: Diagnosis not present

## 2017-10-08 DIAGNOSIS — G4733 Obstructive sleep apnea (adult) (pediatric): Secondary | ICD-10-CM

## 2017-10-08 DIAGNOSIS — E119 Type 2 diabetes mellitus without complications: Secondary | ICD-10-CM

## 2017-10-08 DIAGNOSIS — Z809 Family history of malignant neoplasm, unspecified: Secondary | ICD-10-CM | POA: Diagnosis not present

## 2017-10-08 DIAGNOSIS — Z720 Tobacco use: Secondary | ICD-10-CM | POA: Diagnosis not present

## 2017-10-08 DIAGNOSIS — D751 Secondary polycythemia: Secondary | ICD-10-CM | POA: Insufficient documentation

## 2017-10-08 DIAGNOSIS — I10 Essential (primary) hypertension: Secondary | ICD-10-CM | POA: Insufficient documentation

## 2017-10-08 DIAGNOSIS — D582 Other hemoglobinopathies: Secondary | ICD-10-CM | POA: Insufficient documentation

## 2017-10-08 DIAGNOSIS — R413 Other amnesia: Secondary | ICD-10-CM | POA: Diagnosis not present

## 2017-10-08 LAB — CBC WITH DIFFERENTIAL (CANCER CENTER ONLY)
BASOS ABS: 0 10*3/uL (ref 0.0–0.1)
BASOS PCT: 0 %
EOS ABS: 0.3 10*3/uL (ref 0.0–0.5)
Eosinophils Relative: 3 %
HEMATOCRIT: 58.1 % — AB (ref 34.8–46.6)
Hemoglobin: 18.9 g/dL — ABNORMAL HIGH (ref 11.6–15.9)
Lymphocytes Relative: 25 %
Lymphs Abs: 2.6 10*3/uL (ref 0.9–3.3)
MCH: 32.2 pg (ref 25.1–34.0)
MCHC: 32.5 g/dL (ref 31.5–36.0)
MCV: 99 fL (ref 79.5–101.0)
MONO ABS: 0.8 10*3/uL (ref 0.1–0.9)
Monocytes Relative: 8 %
NEUTROS PCT: 64 %
Neutro Abs: 6.5 10*3/uL (ref 1.5–6.5)
Platelet Count: 146 10*3/uL (ref 145–400)
RBC: 5.87 MIL/uL — ABNORMAL HIGH (ref 3.70–5.45)
RDW: 14.9 % (ref 11.2–16.1)
WBC Count: 10.2 10*3/uL (ref 3.9–10.3)

## 2017-10-08 LAB — LACTATE DEHYDROGENASE: LDH: 195 U/L (ref 125–245)

## 2017-10-08 LAB — TECHNOLOGIST SMEAR REVIEW

## 2017-10-08 LAB — CMP (CANCER CENTER ONLY)
ALK PHOS: 108 U/L (ref 40–150)
ALT: 17 U/L (ref 0–55)
ANION GAP: 12 — AB (ref 3–11)
AST: 16 U/L (ref 5–34)
Albumin: 3.9 g/dL (ref 3.5–5.0)
BILIRUBIN TOTAL: 1.1 mg/dL (ref 0.2–1.2)
BUN: 19 mg/dL (ref 7–26)
CALCIUM: 10.2 mg/dL (ref 8.4–10.4)
CO2: 27 mmol/L (ref 22–29)
CREATININE: 0.98 mg/dL (ref 0.60–1.10)
Chloride: 100 mmol/L (ref 98–109)
GFR, Estimated: 60 mL/min — ABNORMAL LOW (ref >60)
GLUCOSE: 107 mg/dL (ref 70–140)
Potassium: 4.1 mmol/L (ref 3.3–4.7)
Sodium: 139 mmol/L (ref 136–145)
TOTAL PROTEIN: 8.2 g/dL (ref 6.4–8.3)

## 2017-10-08 NOTE — Telephone Encounter (Signed)
Gave avs and calendar for january °

## 2017-10-09 LAB — ERYTHROPOIETIN: Erythropoietin: 15.3 m[IU]/mL (ref 2.6–18.5)

## 2017-10-15 ENCOUNTER — Inpatient Hospital Stay (HOSPITAL_BASED_OUTPATIENT_CLINIC_OR_DEPARTMENT_OTHER): Payer: Medicare Other | Admitting: Hematology and Oncology

## 2017-10-15 ENCOUNTER — Encounter: Payer: Self-pay | Admitting: Hematology and Oncology

## 2017-10-15 ENCOUNTER — Telehealth: Payer: Self-pay | Admitting: Hematology and Oncology

## 2017-10-15 VITALS — BP 126/73 | HR 70 | Temp 98.7°F | Resp 20 | Wt 300.1 lb

## 2017-10-15 DIAGNOSIS — E119 Type 2 diabetes mellitus without complications: Secondary | ICD-10-CM

## 2017-10-15 DIAGNOSIS — D582 Other hemoglobinopathies: Secondary | ICD-10-CM | POA: Diagnosis not present

## 2017-10-15 DIAGNOSIS — Z720 Tobacco use: Secondary | ICD-10-CM

## 2017-10-15 DIAGNOSIS — I10 Essential (primary) hypertension: Secondary | ICD-10-CM

## 2017-10-15 DIAGNOSIS — R413 Other amnesia: Secondary | ICD-10-CM | POA: Diagnosis not present

## 2017-10-15 DIAGNOSIS — D751 Secondary polycythemia: Secondary | ICD-10-CM | POA: Diagnosis not present

## 2017-10-15 DIAGNOSIS — Z809 Family history of malignant neoplasm, unspecified: Secondary | ICD-10-CM | POA: Diagnosis not present

## 2017-10-15 NOTE — Telephone Encounter (Signed)
Appointments made and AVS/Calendar given to patient.

## 2017-10-16 ENCOUNTER — Telehealth: Payer: Self-pay | Admitting: Family Medicine

## 2017-10-16 ENCOUNTER — Other Ambulatory Visit: Payer: Self-pay

## 2017-10-16 NOTE — Telephone Encounter (Signed)
Please advise 

## 2017-10-16 NOTE — Telephone Encounter (Signed)
Spoke with patient and she would like for you to prescribe the patch to quit smoking. I gave her the 1-800-quit smoke and the [employer]MOKE[employer] site. Please advise on prescription.

## 2017-10-16 NOTE — Telephone Encounter (Signed)
Copied from [place] [id number]. Topic: Quick Communication - See Telephone Encounter >> Oct 16, 2017 12:32 PM Vernona Rieger wrote: CRM for notification. See Telephone encounter for:   10/16/17.  Patient said she wants to know if she can have the patch for her to quit smoking. Call back is [phone]

## 2017-10-16 NOTE — Telephone Encounter (Signed)
Okay patch. Follow up in one month to see how she is doing.

## 2017-10-19 ENCOUNTER — Telehealth: Payer: Self-pay | Admitting: *Deleted

## 2017-10-19 NOTE — Telephone Encounter (Signed)
Call from pt reporting she may not be able to come in on Wed due to weather. Instructed her to call back if the weather is bad on Wed. We will be glad to reschedule. She voiced understanding.

## 2017-10-22 ENCOUNTER — Inpatient Hospital Stay: Payer: Medicare Other

## 2017-10-22 VITALS — BP 96/77 | HR 66 | Temp 98.4°F | Resp 20

## 2017-10-22 DIAGNOSIS — Z720 Tobacco use: Secondary | ICD-10-CM | POA: Diagnosis not present

## 2017-10-22 DIAGNOSIS — D751 Secondary polycythemia: Secondary | ICD-10-CM | POA: Diagnosis not present

## 2017-10-22 DIAGNOSIS — I10 Essential (primary) hypertension: Secondary | ICD-10-CM | POA: Diagnosis not present

## 2017-10-22 DIAGNOSIS — Z809 Family history of malignant neoplasm, unspecified: Secondary | ICD-10-CM | POA: Diagnosis not present

## 2017-10-22 DIAGNOSIS — R413 Other amnesia: Secondary | ICD-10-CM | POA: Diagnosis not present

## 2017-10-22 DIAGNOSIS — D582 Other hemoglobinopathies: Secondary | ICD-10-CM | POA: Diagnosis not present

## 2017-10-22 DIAGNOSIS — E119 Type 2 diabetes mellitus without complications: Secondary | ICD-10-CM | POA: Diagnosis not present

## 2017-10-22 NOTE — Patient Instructions (Signed)

## 2017-10-22 NOTE — Progress Notes (Signed)
[name] [name] presents today for phlebotomy per MD orders.  16 gauge used in right [facility] Phlebotomy procedure started at 0758 and ended at 0807. 566 grams removed. Patient observed for 30 minutes after procedure without any incident. Patient ate and drank post procedure. Patient tolerated procedure well. IV needle removed intact. VS stable and completed at discharge 96/77 HR66 RR20 98.4

## 2017-10-22 NOTE — Progress Notes (Signed)
Per Dr Lebron Conners ok to proceed with phlebotomy today with current labs.

## 2017-10-26 ENCOUNTER — Ambulatory Visit (HOSPITAL_COMMUNITY)
Admission: RE | Admit: 2017-10-26 | Discharge: 2017-10-26 | Disposition: A | Discharge status: Home / Self Care | Payer: Medicare Other | Source: Ambulatory Visit | Attending: Hematology and Oncology | Admitting: Hematology and Oncology

## 2017-10-26 ENCOUNTER — Inpatient Hospital Stay: Payer: Medicare Other | Attending: Hematology and Oncology

## 2017-10-26 ENCOUNTER — Ambulatory Visit (HOSPITAL_COMMUNITY): Payer: Medicare Other

## 2017-10-26 DIAGNOSIS — D582 Other hemoglobinopathies: Secondary | ICD-10-CM | POA: Diagnosis not present

## 2017-10-26 DIAGNOSIS — E119 Type 2 diabetes mellitus without complications: Secondary | ICD-10-CM | POA: Diagnosis not present

## 2017-10-26 DIAGNOSIS — D751 Secondary polycythemia: Secondary | ICD-10-CM

## 2017-10-26 DIAGNOSIS — G4733 Obstructive sleep apnea (adult) (pediatric): Secondary | ICD-10-CM | POA: Diagnosis not present

## 2017-10-26 DIAGNOSIS — Z720 Tobacco use: Secondary | ICD-10-CM | POA: Insufficient documentation

## 2017-10-26 DIAGNOSIS — I10 Essential (primary) hypertension: Secondary | ICD-10-CM | POA: Diagnosis not present

## 2017-10-26 LAB — CBC WITH DIFFERENTIAL (CANCER CENTER ONLY)
Basophils Absolute: 0 10*3/uL (ref 0.0–0.1)
Basophils Relative: 0 %
Eosinophils Absolute: 0.3 10*3/uL (ref 0.0–0.5)
Eosinophils Relative: 3 %
HEMATOCRIT: 52.2 % — AB (ref 34.8–46.6)
HEMOGLOBIN: 16.1 g/dL — AB (ref 11.6–15.9)
LYMPHS PCT: 30 %
Lymphs Abs: 2.2 10*3/uL (ref 0.9–3.3)
MCH: 31.3 pg (ref 25.1–34.0)
MCHC: 30.8 g/dL — ABNORMAL LOW (ref 31.5–36.0)
MCV: 101.4 fL — AB (ref 79.5–101.0)
Monocytes Absolute: 0.5 10*3/uL (ref 0.1–0.9)
Monocytes Relative: 7 %
NEUTROS ABS: 4.4 10*3/uL (ref 1.5–6.5)
Neutrophils Relative %: 60 %
Platelet Count: 151 10*3/uL (ref 145–400)
RBC: 5.15 MIL/uL (ref 3.70–5.45)
RDW: 15.2 % — ABNORMAL HIGH (ref 11.2–14.5)
WBC: 7.4 10*3/uL (ref 3.9–10.3)

## 2017-10-27 NOTE — Assessment & Plan Note (Signed)
67 y.o. Female with history of obstructive sleep apnea and long-term smoking habit presenting with progressively elevated hemoglobin of at least 2-year duration.  Most recent hemoglobin values 19.1 with hematocrit of 58.4%.  No significant leukocytosis or thrombocythemia.  Present labs are more than a month old.  We will start our evaluation for possible primary versus secondary polycythemia by obtaining repeat lab work including blood counts and erythropoietin level.  Plan: -Labs today as outlined below. -Return to clinic in 1 week to review the findings and continue workup.

## 2017-10-27 NOTE — Progress Notes (Signed)
[facility] New Visit:  Assessment: Elevated hemoglobin ([place]) 67 y.o. Female with history of obstructive sleep apnea and long-term smoking habit presenting with progressively elevated hemoglobin of at least 2-year duration.  Most recent hemoglobin values 19.1 with hematocrit of 58.4%.  No significant leukocytosis or thrombocythemia.  Present labs are more than a month old.  We will start our evaluation for possible primary versus secondary polycythemia by obtaining repeat lab work including blood counts and erythropoietin level.  Plan: -Labs today as outlined below. -Return to clinic in 1 week to review the findings and continue workup.  Voice recognition software was used and creation of this note. Despite my best effort at editing the text, some misspelling/errors may have occurred.  Orders Placed This Encounter  Procedures  . CBC with Differential (Cancer Center Only)    Standing Status:   Future    Number of Occurrences:   1    Standing Expiration Date:   10/08/2018  . CMP ([facility] only)    Standing Status:   Future    Number of Occurrences:   1    Standing Expiration Date:   10/08/2018  . Lactate dehydrogenase (LDH)    Standing Status:   Future    Number of Occurrences:   1    Standing Expiration Date:   10/08/2018  . Technologist smear review    Standing Status:   Future    Number of Occurrences:   1    Standing Expiration Date:   10/08/2018  . Erythropoietin    Standing Status:   Future    Number of Occurrences:   1    Standing Expiration Date:   10/08/2018    All questions were answered.  . The patient knows to call the clinic with any problems, questions or concerns.  This note was electronically signed.    History of Presenting Illness Kaileia Foland 67 y.o. presenting to the [facility] for evaluation of suspected polycythemia, referred by Dr Briscoe Deutscher.  Patient's past medical history is significant for  obstructive sleep apnea, diabetes  mellitus type 2, coronary artery disease with previous history of PCI and stenting, hypertension, hyperlipidemia, morbid obesity, chronic bronchitis.  Patient is a chronic smoker currently smoking approximately 0.5 packs/day for the past 31 years.  No active alcohol or recreational substance use.  Family history significant for mesothelioma secondary to asbestos exposure in the mother and no history of hematological disorders, lymphoma, or leukemia.  At this time, patient's complaints include decrease in memory.  Otherwise she reports feeling and functioning reasonably well.  She was recently diagnosed with a sinus infection last Friday and is currently receiving antibiotic therapy for that.  She did have pulmonary function tests in July of last year reportedly showing no evidence of COPD, and subsequently was diagnosed with obstructive sleep apnea after undergoing polysomnography.  It is unclear if patient is using CPAP at this point in time  Oncological/hematological History: --Labs, 03/10/16: WBC 9.8, Hgb 16.1, Hct     ..., Plt 143 --Labs, 01/28/17: WBC 8.3, Hgb 17.8, Hct 53.7, Plt 177; --Labs, 05/27/17: WBC 6.8, Hgb 17.5, Hct 54.0, Plt 162 --Labs, 08/28/17: WBC 7.4, Hgb 19.1, Hct 58.4, Plt 165   Medical History: Past Medical History:  Diagnosis Date  . Bronchitis, asthmatic   . Coronary artery disease    BMS to LAD 02/2008  . Diabetes mellitus without complication ([place])   . Hyperlipidemia   . Hypertensive heart disease 04/16/2016  . Hypothyroidism   .  MI, old   . Morbid obesity ([place]) 04/16/2016  . S/P coronary artery stent placement 04/16/2016   BMS to LAD 2009  . Tobacco abuse 04/16/2016    Surgical History: Past Surgical History:  Procedure Laterality Date  . ABDOMINAL HYSTERECTOMY    . ANKLE SURGERY Right   . CHOLECYSTECTOMY    . CORONARY ANGIOPLASTY  02/2008   bare metal stent to LAD   . CORONARY STENT PLACEMENT    . KNEE ARTHROSCOPY    . ROTATOR CUFF REPAIR Bilateral   .  THORACIC OUTLET SURGERY    . WRIST SURGERY      Family History: Family History  Problem Relation Age of Onset  . Cancer Mother   . Diabetes Son   . Hypertension Son     Social History: Social History   Socioeconomic History  . Marital status: Married    Spouse name: Not on file  . Number of children: Not on file  . Years of education: Not on file  . Highest education level: Not on file  Social Needs  . Financial resource strain: Not on file  . Food insecurity - worry: Not on file  . Food insecurity - inability: Not on file  . Transportation needs - medical: Not on file  . Transportation needs - non-medical: Not on file  Occupational History  . Not on file  Tobacco Use  . Smoking status: Current Every Day Smoker    Packs/day: 0.50    Years: 31.00    Pack years: 15.50  . Smokeless tobacco: Never Used  Substance and Sexual Activity  . Alcohol use: No  . Drug use: No  . Sexual activity: Yes    Partners: Male  Other Topics Concern  . Not on file  Social History Narrative        Allergies: Allergies  Allergen Reactions  . Clindamycin/Lincomycin Other (See Comments)    Head ache   . Codeine Hives  . Dilaudid [Hydromorphone Hcl] Nausea And Vomiting  . Iodides   . Reglan [Metoclopramide] Other (See Comments)    Other reaction(s): Dystonia Hallucinations/Violent  . Reglan [Metoclopramide]   . Tramadol   . Dilaudid [Hydromorphone Hcl] Nausea And Vomiting  . Tape Rash    Blisters     Medications:  Current Outpatient Medications  Medication Sig Dispense Refill  . albuterol (ACCUNEB) 1.25 MG/3ML nebulizer solution Take 1 ampule by nebulization 3 (three) times daily as needed for wheezing.    Marland Kitchen albuterol (PROAIR HFA) 108 (90 Base) MCG/ACT inhaler Inhale 2 puffs into the lungs every 6 (six) hours as needed for wheezing or shortness of breath.    . ALPRAZolam (XANAX) 0.5 MG tablet Take 1 tablet (0.5 mg total) by mouth 2 (two) times daily as needed for anxiety. 60  tablet 0  . aspirin 81 MG tablet Take 81 mg by mouth daily.    Marland Kitchen atorvastatin (LIPITOR) 10 MG tablet TAKE 1 TABLET(10 MG) BY MOUTH DAILY 30 tablet 4  . azithromycin (ZITHROMAX) 250 MG tablet Take 2 tabs day 1, then 1 tab daily 6 each 0  . benzonatate (TESSALON) 200 MG capsule Take 1 capsule (200 mg total) by mouth 2 (two) times daily as needed for cough. 20 capsule 0  . budesonide-formoterol (SYMBICORT) 80-4.5 MCG/ACT inhaler Inhale 2 puffs into the lungs 2 (two) times daily. 1 Inhaler 11  . celecoxib (CELEBREX) 200 MG capsule Take 1 capsule (200 mg total) by mouth daily. 30 capsule 2  . clopidogrel (PLAVIX) 75  MG tablet TAKE 1 TABLET BY MOUTH DAILY 90 tablet 2  . furosemide (LASIX) 40 MG tablet Take 1 tablet (40 mg total) by mouth daily as needed for fluid. 30 tablet 6  . gabapentin (NEURONTIN) 300 MG capsule Take 300 mg by mouth 2 (two) times daily.    . INVOKANA 300 MG TABS tablet Take 1 tablet (300 mg total) by mouth daily. 30 tablet 2  . ipratropium (ATROVENT) 0.06 % nasal spray Place 2 sprays into both nostrils 4 (four) times daily. 15 mL 0  . isosorbide mononitrate (IMDUR) 60 MG 24 hr tablet Take 1 tablet (60 mg total) by mouth daily. 90 tablet 2  . levothyroxine (SYNTHROID, LEVOTHROID) 112 MCG tablet Take 1 tablet (112 mcg total) by mouth daily. 90 tablet 3  . metFORMIN (GLUCOPHAGE) 500 MG tablet Take 1 tablet (500 mg total) by mouth daily. 90 tablet 2  . metoprolol succinate (TOPROL-XL) 50 MG 24 hr tablet TAKE 1 1/2 TABLETS BY MOUTH EVERY DAY 135 tablet 0  . nitroGLYCERIN (NITROSTAT) 0.4 MG SL tablet Place 1 tablet (0.4 mg total) under the tongue every 5 (five) minutes as needed for chest pain. 20 tablet 1  . nitrofurantoin, macrocrystal-monohydrate, (MACROBID) 100 MG capsule Take 1 capsule (100 mg total) by mouth 2 (two) times daily. (Patient not taking: Reported on 10/08/2017) 14 capsule 0   No current facility-administered medications for this visit.     Review of Systems: Review of  Systems  Constitutional: Positive for fatigue.  Psychiatric/Behavioral: Positive for decreased concentration.  All other systems reviewed and are negative.    PHYSICAL EXAMINATION Blood pressure 114/66, pulse 72, temperature 98.8 F (37.1 C), temperature source Oral, resp. rate (!) 22, weight 295 lb 12.8 oz (134.2 kg), SpO2 96 %.  ECOG PERFORMANCE STATUS: 2 - Symptomatic, <50% confined to bed  Physical Exam  Constitutional: She is oriented to person, place, and time and well-developed, well-nourished, and in no distress. No distress.  HENT:  Head: Normocephalic and atraumatic.  Mouth/Throat: Oropharynx is clear and moist. No oropharyngeal exudate.  Eyes: Conjunctivae and EOM are normal. Pupils are equal, round, and reactive to light. No scleral icterus.  Neck: No thyromegaly present.  Cardiovascular: Normal rate, regular rhythm and normal heart sounds.  No murmur heard. Pulmonary/Chest: Effort normal and breath sounds normal. No respiratory distress. She has no wheezes. She has no rales.  Abdominal: Soft. Bowel sounds are normal. She exhibits no distension and no mass. There is no tenderness. There is no guarding.  Musculoskeletal: She exhibits no edema.  Lymphadenopathy:    She has no cervical adenopathy.  Neurological: She is alert and oriented to person, place, and time. She has normal reflexes. No cranial nerve deficit.  Skin: Skin is warm and dry. No rash noted. She is not diaphoretic. No erythema. No pallor.     LABORATORY DATA: I have personally reviewed the data as listed: Appointment on 10/08/2017  Component Date Value Ref Range Status  . Erythropoietin 10/08/2017 15.3  2.6 - 18.5 mIU/mL Final   Comment: (NOTE) [facility] [employer] DxI [place] obtained with different assay methods or kits cannot be used interchangeably. Results cannot be interpreted as absolute evidence of the presence or absence of malignant disease. Performed At: [facility] [place], [place] [id number] Rush Farmer MD VO:[id number] Performed at [facility], [place] [address] [place], [place] [address]   . Tech Review 10/08/2017 ATYPICAL LYMPHS   Final  Performed at [facility], [place] [address] [place], [place] [address]  . LDH 10/08/2017 195  125 - 245 U/L Final   Performed at [facility], [place] [address] [place], [place] [address]  . Sodium 10/08/2017 139  136 - 145 mmol/L Final  . Potassium 10/08/2017 4.1  3.3 - 4.7 mmol/L Final  . Chloride 10/08/2017 100  98 - 109 mmol/L Final  . CO2 10/08/2017 27  22 - 29 mmol/L Final  . Glucose, Bld 10/08/2017 107  70 - 140 mg/dL Final  . BUN 10/08/2017 19  7 - 26 mg/dL Final  . Creatinine 10/08/2017 0.98  0.60 - 1.10 mg/dL Final  . Calcium 10/08/2017 10.2  8.4 - 10.4 mg/dL Final  . Total Protein 10/08/2017 8.2  6.4 - 8.3 g/dL Final  . Albumin 10/08/2017 3.9  3.5 - 5.0 g/dL Final  . AST 10/08/2017 16  5 - 34 U/L Final  . ALT 10/08/2017 17  0 - 55 U/L Final  . Alkaline Phosphatase 10/08/2017 108  40 - 150 U/L Final  . Total Bilirubin 10/08/2017 1.1  0.2 - 1.2 mg/dL Final  . GFR, Est Non Af Am 10/08/2017 60* >60 mL/min Final  . GFR, Est AFR Am 10/08/2017 >60  >60 mL/min Final   Comment: (NOTE) The eGFR has been calculated using the CKD EPI equation. This calculation has not been validated in all clinical situations. eGFR's persistently <60 mL/min signify possible Chronic Kidney Disease.   Georgiann Hahn gap 10/08/2017 12* 3 - 11 Final   Performed at [facility], [place] [address], [place], [place] [address]  . WBC Count 10/08/2017 10.2  3.9 - 10.3 K/uL Final  . RBC 10/08/2017 5.87* 3.70 - 5.45 MIL/uL Final  . Hemoglobin 10/08/2017 18.9* 11.6 - 15.9 g/dL Final  . HCT 10/08/2017 58.1* 34.8 - 46.6 % Final  . MCV 10/08/2017 99.0  79.5 - 101.0 fL Final  . MCH 10/08/2017 32.2  25.1 - 34.0 pg Final   . MCHC 10/08/2017 32.5  31.5 - 36.0 g/dL Final  . RDW 10/08/2017 14.9  11.2 - 16.1 % Final  . Platelet Count 10/08/2017 146  145 - 400 K/uL Final  . Neutrophils Relative % 10/08/2017 64  % Final  . Neutro Abs 10/08/2017 6.5  1.5 - 6.5 K/uL Final  . Lymphocytes Relative 10/08/2017 25  % Final  . Lymphs Abs 10/08/2017 2.6  0.9 - 3.3 K/uL Final  . Monocytes Relative 10/08/2017 8  % Final  . Monocytes Absolute 10/08/2017 0.8  0.1 - 0.9 K/uL Final  . Eosinophils Relative 10/08/2017 3  % Final  . Eosinophils Absolute 10/08/2017 0.3  0.0 - 0.5 K/uL Final  . Basophils Relative 10/08/2017 0  % Final  . Basophils Absolute 10/08/2017 0.0  0.0 - 0.1 K/uL Final   Performed at [facility], [place] [address] [place], [place] [address]         Ardath Sax, MD

## 2017-10-27 NOTE — Assessment & Plan Note (Signed)
67 y.o. Female with history of obstructive sleep apnea and long-term smoking habit presenting with progressively elevated hemoglobin of at least 2-year duration.  Results of evaluation demonstrated erythropoietin in the higher normal range making primary hematological disorder significantly less likely.  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  At this time, patient's hematocrit exceeds 55% and thus place patient is on 3 his risk of heart attack or stroke.  With that in mind, I do recommend patient to proceed with therapeutic phlebotomy to bring hematocrit to or below 55% marked.  Plan: -CT of the chest/abdomen/pelvis to evaluate structural lungs, also to evaluate for possible presence of hepatic or renal masses.  Patient has had a hysterectomy in the past.   -Therapeutic phlebotomy x1 -Return to clinic in 2 weeks with labs, clinic assessment, and possible additional phlebotomy.

## 2017-10-27 NOTE — Progress Notes (Signed)
[facility] Follow-up Visit:  Assessment: Polycythemia, secondary 67 y.o. Female with history of obstructive sleep apnea and long-term smoking habit presenting with progressively elevated hemoglobin of at least 2-year duration.  Results of evaluation demonstrated erythropoietin in the higher normal range making primary hematological disorder significantly less likely.  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  At this time, patient's hematocrit exceeds 55% and thus place patient is on 3 his risk of heart attack or stroke.  With that in mind, I do recommend patient to proceed with therapeutic phlebotomy to bring hematocrit to or below 55% marked.  Plan: -CT of the chest/abdomen/pelvis to evaluate structural lungs, also to evaluate for possible presence of hepatic or renal masses.  Patient has had a hysterectomy in the past.   -Therapeutic phlebotomy x1 -Return to clinic in 2 weeks with labs, clinic assessment, and possible additional phlebotomy.    Voice recognition software was used and creation of this note. Despite my best effort at editing the text, some misspelling/errors may have occurred.  Orders Placed This Encounter  Procedures  . US Abdomen Complete    Standing Status:   Future    Number of Occurrences:   1    Standing Expiration Date:   10/16/2018    Order Specific Question:   Reason for Exam (SYMPTOM  OR DIAGNOSIS REQUIRED)    Answer:   Erythrocytosis with elevated erythropoietin, please eval for hepatic or renal mass presence    Order Specific Question:   Preferred imaging location?    Answer:   [facility]  . CBC with Differential ([place] Only)    Standing Status:   Future    Number of Occurrences:   1    Standing Expiration Date:   10/15/2018    Cancer Staging No matching staging information was found for the patient.  All questions were answered.  . The patient knows to call  the clinic with any problems, questions or concerns.  This note was electronically signed.    History of Presenting Illness [name] [name] is a 67 y.o. female followed in the [place] for evaluation of suspected polycythemia, referred by Dr Briscoe Deutscher.  Patient's past medical history is significant for  obstructive sleep apnea, diabetes mellitus type 2, coronary artery disease with previous history of PCI and stenting, hypertension, hyperlipidemia, morbid obesity, chronic bronchitis.  Patient is a chronic smoker currently smoking approximately 0.5 packs/day for the past 31 years.  No active alcohol or recreational substance use.  Family history significant for mesothelioma secondary to asbestos exposure in the mother and no history of hematological disorders, lymphoma, or leukemia.  At this time, patient's complaints include decrease in memory.  Otherwise she reports feeling and functioning reasonably well.  She was recently diagnosed with a sinus infection last Friday and is currently receiving antibiotic therapy for that.  She did have pulmonary function tests in July of last year reportedly showing no evidence of COPD, and subsequently was diagnosed with obstructive sleep apnea after undergoing polysomnography.  It is unclear if patient is using CPAP at this point in time.  Patient returns to the clinic to review lab work findings from the last visit.  She denies any new complaints.  Oncological/hematological History: --Labs, 03/10/16: WBC   9.8,                Hgb 16.1, Hct     ...,  Plt 143 --Labs, 01/28/17: WBC   8.3,                Hgb 17.8, Hct 53.7,        Plt 177; --Labs, 05/27/17: WBC   6.8,                Hgb 17.5, Hct 54.0,        Plt 162 --Labs, 08/28/17: WBC   7.4,                Hgb 19.1, Hct 58.4,        Plt 165 --Labs, 10/08/17: WBC 10.2, ANC 6.5, ALC 2.6, Mono 0.8, Hgb 18.9, Hct 58.1, MCV 99.0, MCH 23.2, RDW 14.9, Plt 146; Epo 15.3    Medical History: Past Medical  History:  Diagnosis Date  . Bronchitis, asthmatic   . Coronary artery disease    BMS to LAD 02/2008  . Diabetes mellitus without complication ([place])   . Hyperlipidemia   . Hypertensive heart disease 04/16/2016  . Hypothyroidism   . MI, old   . Morbid obesity ([place]) 04/16/2016  . S/P coronary artery stent placement 04/16/2016   BMS to LAD 2009  . Tobacco abuse 04/16/2016    Surgical History: Past Surgical History:  Procedure Laterality Date  . ABDOMINAL HYSTERECTOMY    . ANKLE SURGERY Right   . CHOLECYSTECTOMY    . CORONARY ANGIOPLASTY  02/2008   bare metal stent to LAD   . CORONARY STENT PLACEMENT    . KNEE ARTHROSCOPY    . ROTATOR CUFF REPAIR Bilateral   . THORACIC OUTLET SURGERY    . WRIST SURGERY      Family History: Family History  Problem Relation Age of Onset  . Cancer Mother   . Diabetes Son   . Hypertension Son     Social History: Social History   Socioeconomic History  . Marital status: Married    Spouse name: Not on file  . Number of children: Not on file  . Years of education: Not on file  . Highest education level: Not on file  Social Needs  . Financial resource strain: Not on file  . Food insecurity - worry: Not on file  . Food insecurity - inability: Not on file  . Transportation needs - medical: Not on file  . Transportation needs - non-medical: Not on file  Occupational History  . Not on file  Tobacco Use  . Smoking status: Current Every Day Smoker    Packs/day: 0.50    Years: 31.00    Pack years: 15.50  . Smokeless tobacco: Never Used  Substance and Sexual Activity  . Alcohol use: No  . Drug use: No  . Sexual activity: Yes    Partners: Male  Other Topics Concern  . Not on file  Social History Narrative        Allergies: Allergies  Allergen Reactions  . Clindamycin/Lincomycin Other (See Comments)    Head ache   . Codeine Hives  . Dilaudid [Hydromorphone Hcl] Nausea And Vomiting  . Iodides   . Reglan [Metoclopramide] Other (See  Comments)    Other reaction(s): Dystonia Hallucinations/Violent  . Reglan [Metoclopramide]   . Tramadol   . Dilaudid [Hydromorphone Hcl] Nausea And Vomiting  . Tape Rash    Blisters     Medications:  Current Outpatient Medications  Medication Sig Dispense Refill  . albuterol (ACCUNEB) 1.25 MG/3ML nebulizer solution Take 1 ampule by nebulization 3 (three) times daily as needed  for wheezing.    Marland Kitchen albuterol (PROAIR HFA) 108 (90 Base) MCG/ACT inhaler Inhale 2 puffs into the lungs every 6 (six) hours as needed for wheezing or shortness of breath.    . ALPRAZolam (XANAX) 0.5 MG tablet Take 1 tablet (0.5 mg total) by mouth 2 (two) times daily as needed for anxiety. 60 tablet 0  . aspirin 81 MG tablet Take 81 mg by mouth daily.    Marland Kitchen atorvastatin (LIPITOR) 10 MG tablet TAKE 1 TABLET(10 MG) BY MOUTH DAILY 30 tablet 4  . azithromycin (ZITHROMAX) 250 MG tablet Take 2 tabs day 1, then 1 tab daily 6 each 0  . benzonatate (TESSALON) 200 MG capsule Take 1 capsule (200 mg total) by mouth 2 (two) times daily as needed for cough. 20 capsule 0  . budesonide-formoterol (SYMBICORT) 80-4.5 MCG/ACT inhaler Inhale 2 puffs into the lungs 2 (two) times daily. 1 Inhaler 11  . celecoxib (CELEBREX) 200 MG capsule Take 1 capsule (200 mg total) by mouth daily. 30 capsule 2  . clopidogrel (PLAVIX) 75 MG tablet TAKE 1 TABLET BY MOUTH DAILY 90 tablet 2  . furosemide (LASIX) 40 MG tablet Take 1 tablet (40 mg total) by mouth daily as needed for fluid. 30 tablet 6  . gabapentin (NEURONTIN) 300 MG capsule Take 300 mg by mouth 2 (two) times daily.    . INVOKANA 300 MG TABS tablet Take 1 tablet (300 mg total) by mouth daily. 30 tablet 2  . ipratropium (ATROVENT) 0.06 % nasal spray Place 2 sprays into both nostrils 4 (four) times daily. 15 mL 0  . isosorbide mononitrate (IMDUR) 60 MG 24 hr tablet Take 1 tablet (60 mg total) by mouth daily. 90 tablet 2  . levothyroxine (SYNTHROID, LEVOTHROID) 112 MCG tablet Take 1 tablet (112 mcg  total) by mouth daily. 90 tablet 3  . metFORMIN (GLUCOPHAGE) 500 MG tablet Take 1 tablet (500 mg total) by mouth daily. 90 tablet 2  . metoprolol succinate (TOPROL-XL) 50 MG 24 hr tablet TAKE 1 1/2 TABLETS BY MOUTH EVERY DAY 135 tablet 0  . nitrofurantoin, macrocrystal-monohydrate, (MACROBID) 100 MG capsule Take 1 capsule (100 mg total) by mouth 2 (two) times daily. (Patient not taking: Reported on 10/08/2017) 14 capsule 0  . nitroGLYCERIN (NITROSTAT) 0.4 MG SL tablet Place 1 tablet (0.4 mg total) under the tongue every 5 (five) minutes as needed for chest pain. 20 tablet 1   No current facility-administered medications for this visit.     Review of Systems: Review of Systems  Constitutional: Positive for fatigue.  Psychiatric/Behavioral: Positive for decreased concentration.  All other systems reviewed and are negative.    PHYSICAL EXAMINATION Blood pressure 126/73, pulse 70, temperature 98.7 F (37.1 C), temperature source Oral, resp. rate 20, weight (!) 300 lb 1.6 oz (136.1 kg), SpO2 97 %.  ECOG PERFORMANCE STATUS: 1 - Symptomatic but completely ambulatory  Physical Exam  Constitutional: She is oriented to person, place, and time and well-developed, well-nourished, and in no distress. No distress.  HENT:  Head: Normocephalic and atraumatic.  Mouth/Throat: Oropharynx is clear and moist. No oropharyngeal exudate.  Eyes: Conjunctivae and EOM are normal. Pupils are equal, round, and reactive to light. No scleral icterus.  Neck: No thyromegaly present.  Cardiovascular: Normal rate, regular rhythm and normal heart sounds.  No murmur heard. Pulmonary/Chest: Effort normal and breath sounds normal. No respiratory distress. She has no wheezes. She has no rales.  Abdominal: Soft. Bowel sounds are normal. She exhibits no distension and no  mass. There is no tenderness. There is no guarding.  Musculoskeletal: She exhibits no edema.  Lymphadenopathy:    She has no cervical adenopathy.   Neurological: She is alert and oriented to person, place, and time. She has normal reflexes. No cranial nerve deficit.  Skin: Skin is warm and dry. No rash noted. She is not diaphoretic. No erythema. No pallor.     LABORATORY DATA: I have personally reviewed the data as listed: No visits with results within 1 Week(s) from this visit.  Latest known visit with results is:  Appointment on 10/08/2017  Component Date Value Ref Range Status  . Erythropoietin 10/08/2017 15.3  2.6 - 18.5 mIU/mL Final   Comment: (NOTE) [employer] UniCel DxI Excel obtained with different assay methods or kits cannot be used interchangeably. Results cannot be interpreted as absolute evidence of the presence or absence of malignant disease. Performed At: [facility] [place], [place] [id number] Rush Farmer MD [id number] Performed at [facility], [place] [address], [place], [place] [address]   . Tech Review 10/08/2017 ATYPICAL LYMPHS   Final   Performed at [facility], [address], [place], [place] [address]  . LDH 10/08/2017 195  125 - 245 U/L Final   Performed at [facility], [place] [address], [place], [place] [address]  . Sodium 10/08/2017 139  136 - 145 mmol/L Final  . Potassium 10/08/2017 4.1  3.3 - 4.7 mmol/L Final  . Chloride 10/08/2017 100  98 - 109 mmol/L Final  . CO2 10/08/2017 27  22 - 29 mmol/L Final  . Glucose, Bld 10/08/2017 107  70 - 140 mg/dL Final  . BUN 10/08/2017 19  7 - 26 mg/dL Final  . Creatinine 10/08/2017 0.98  0.60 - 1.10 mg/dL Final  . Calcium 10/08/2017 10.2  8.4 - 10.4 mg/dL Final  . Total Protein 10/08/2017 8.2  6.4 - 8.3 g/dL Final  . Albumin 10/08/2017 3.9  3.5 - 5.0 g/dL Final  . AST 10/08/2017 16  5 - 34 U/L Final  . ALT 10/08/2017 17  0 - 55 U/L Final  . Alkaline Phosphatase 10/08/2017 108  40 - 150 U/L Final  . Total Bilirubin 10/08/2017  1.1  0.2 - 1.2 mg/dL Final  . GFR, Est Non Af Am 10/08/2017 60* >60 mL/min Final  . GFR, Est AFR Am 10/08/2017 >60  >60 mL/min Final   Comment: (NOTE) The eGFR has been calculated using the CKD EPI equation. This calculation has not been validated in all clinical situations. eGFR's persistently <60 mL/min signify possible Chronic Kidney Disease.   Georgiann Hahn gap 10/08/2017 12* 3 - 11 Final   Performed at [facility], [place] [address], [place], [place] [address]  . WBC Count 10/08/2017 10.2  3.9 - 10.3 K/uL Final  . RBC 10/08/2017 5.87* 3.70 - 5.45 MIL/uL Final  . Hemoglobin 10/08/2017 18.9* 11.6 - 15.9 g/dL Final  . HCT 10/08/2017 58.1* 34.8 - 46.6 % Final  . MCV 10/08/2017 99.0  79.5 - 101.0 fL Final  . MCH 10/08/2017 32.2  25.1 - 34.0 pg Final  . MCHC 10/08/2017 32.5  31.5 - 36.0 g/dL Final  . RDW 10/08/2017 14.9  11.2 - 16.1 % Final  . Platelet Count 10/08/2017 146  145 - 400 K/uL Final  . Neutrophils Relative % 10/08/2017 64  % Final  . Neutro Abs 10/08/2017 6.5  1.5 - 6.5 K/uL  Final  . Lymphocytes Relative 10/08/2017 25  % Final  . Lymphs Abs 10/08/2017 2.6  0.9 - 3.3 K/uL Final  . Monocytes Relative 10/08/2017 8  % Final  . Monocytes Absolute 10/08/2017 0.8  0.1 - 0.9 K/uL Final  . Eosinophils Relative 10/08/2017 3  % Final  . Eosinophils Absolute 10/08/2017 0.3  0.0 - 0.5 K/uL Final  . Basophils Relative 10/08/2017 0  % Final  . Basophils Absolute 10/08/2017 0.0  0.0 - 0.1 K/uL Final   Performed at [facility], [place] [address], [place], [place] [address]       Ardath Sax, MD

## 2017-10-29 ENCOUNTER — Other Ambulatory Visit: Payer: Self-pay

## 2017-10-29 ENCOUNTER — Encounter: Payer: Self-pay | Admitting: Hematology and Oncology

## 2017-10-29 ENCOUNTER — Inpatient Hospital Stay (HOSPITAL_BASED_OUTPATIENT_CLINIC_OR_DEPARTMENT_OTHER): Payer: Medicare Other | Admitting: Hematology and Oncology

## 2017-10-29 VITALS — BP 112/66 | HR 61 | Temp 98.2°F | Resp 17 | Ht 65.0 in | Wt 303.1 lb

## 2017-10-29 DIAGNOSIS — G4733 Obstructive sleep apnea (adult) (pediatric): Secondary | ICD-10-CM | POA: Diagnosis not present

## 2017-10-29 DIAGNOSIS — E119 Type 2 diabetes mellitus without complications: Secondary | ICD-10-CM

## 2017-10-29 DIAGNOSIS — Z720 Tobacco use: Secondary | ICD-10-CM | POA: Diagnosis not present

## 2017-10-29 DIAGNOSIS — D582 Other hemoglobinopathies: Secondary | ICD-10-CM

## 2017-10-29 DIAGNOSIS — I10 Essential (primary) hypertension: Secondary | ICD-10-CM

## 2017-10-29 DIAGNOSIS — D751 Secondary polycythemia: Secondary | ICD-10-CM

## 2017-10-30 ENCOUNTER — Telehealth: Payer: Self-pay

## 2017-10-30 NOTE — Telephone Encounter (Signed)
Called and spoke with patient only wanted to be seen by [place]. Rescheduled with him. Per 2/8 phone messaging.

## 2017-11-05 ENCOUNTER — Other Ambulatory Visit: Payer: Self-pay | Admitting: Family Medicine

## 2017-11-09 ENCOUNTER — Telehealth: Payer: Self-pay | Admitting: Family Medicine

## 2017-11-09 DIAGNOSIS — F419 Anxiety disorder, unspecified: Secondary | ICD-10-CM

## 2017-11-09 MED ORDER — ALPRAZOLAM 0.5 MG PO TABS: 0.5000 mg | ORAL_TABLET | Freq: Two times a day (BID) | ORAL | 0 refills | Status: DC | PRN

## 2017-11-09 NOTE — Telephone Encounter (Signed)
Completed.

## 2017-11-09 NOTE — Telephone Encounter (Signed)
Please advise 

## 2017-11-09 NOTE — Telephone Encounter (Signed)
Copied from [place] [id number]. Topic: Quick Communication - Rx Refill/Question >> Nov 09, 2017 12:19 PM Boyd Kerbs wrote:  Medication: ALPRAZolam Duanne Moron) 0.5 MG tablet  Would like to have tomorrow-  does not want to be out on Wed. With weather. Told her can take up to 3 days.   Has the patient contacted their pharmacy? No.   (Agent: If no, request that the patient contact the pharmacy for the refill.)   Preferred Pharmacy (with phone number or street name):   [facility] [phone], [place] [address] Phone: [phone] Fax: [phone]  Agent: Please be advised that RX refills may take up to 3 business days. We ask that you follow-up with your pharmacy.

## 2017-11-11 IMAGING — CR DG CHEST 2V
2 series · 2 of 2 positions shown · non-contrast
Comparison: None.

CLINICAL DATA: Shortness of breath for 1 week

EXAM:
CHEST  2 VIEW

[chest pa]
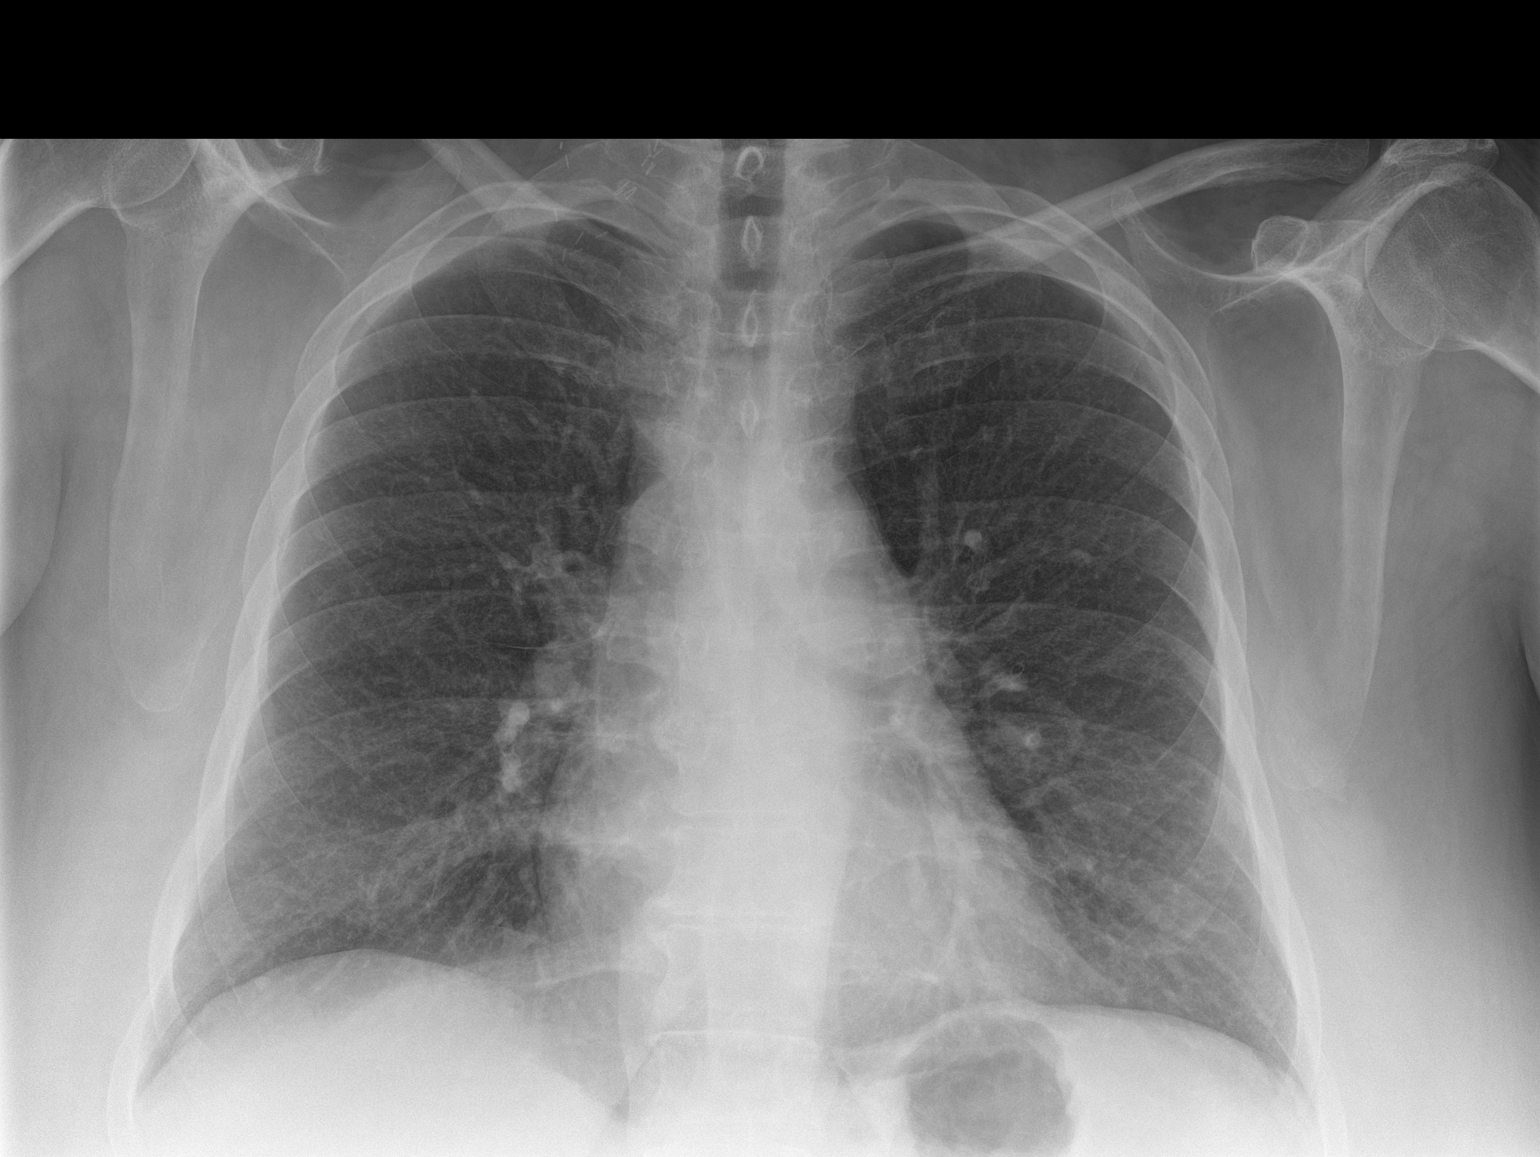

[chest lat]
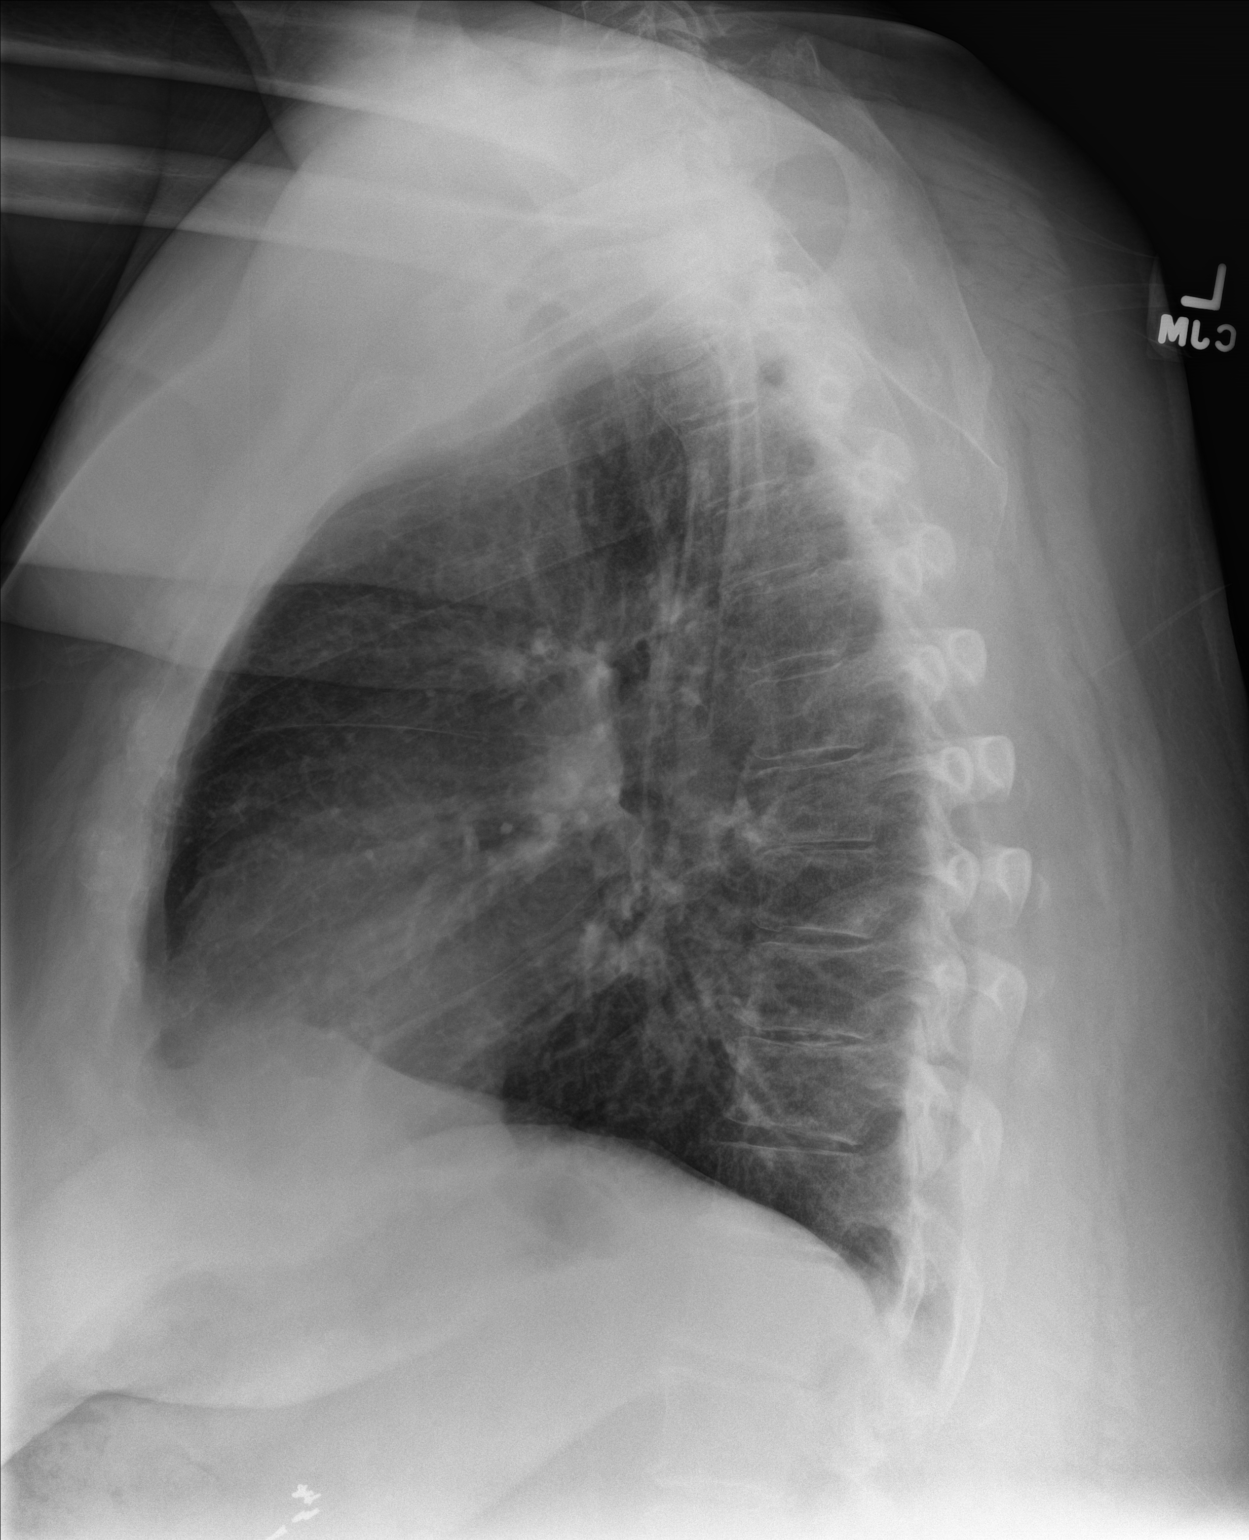

[2 of 2 positions shown; findings below may reference images not displayed]

FINDINGS: Cardiac shadow is within normal limits. Lungs are hyperaerated
bilaterally. No focal infiltrate or sizable effusion is seen. No
acute bony abnormality is noted. Calcified granuloma is noted in the
left mid lung.
IMPRESSION: COPD without acute abnormality.

## 2017-11-17 ENCOUNTER — Ambulatory Visit: Payer: Medicare Other | Admitting: Cardiovascular Disease

## 2017-11-17 NOTE — Progress Notes (Deleted)
Cardiology Office Note   Date:  11/17/2017   ID:  [name], [name] 1950-10-29, MRN [id number]  PCP:  Briscoe Deutscher, DO  Cardiologist:   Skeet Latch, MD   No chief complaint on file.    History of Present Illness: [name] [name] is a 67 y.o. female with CAD s/p PCI, COPD, morbid obesity, chronic bronchitis, and hypothyroidism who presents to for follow up.  While living in [place] she had a BMS in the LAD 02/2008.  Since then she has done very well.  She had a 2 day Persantine Cardiolite on 01/07/13 that was negative for ischemia. LVEF was 79%.  She denies any recent chest pain or shortness of breath. She is the primary caretaker of her 16 year old grandson who has ADD. Her husband also suffers from posttraumatic stress disorder.    ?stress-son, grandson, husband  F/u exercise and smoking  Past Medical History:  Diagnosis Date  . Bronchitis, asthmatic   . Coronary artery disease    BMS to LAD 02/2008  . Diabetes mellitus without complication ([name] [name])   . Hyperlipidemia   . Hypertensive heart disease 04/16/2016  . Hypothyroidism   . MI, old   . Morbid obesity ([name] [name]) 04/16/2016  . S/P coronary artery stent placement 04/16/2016   BMS to LAD 2009  . Tobacco abuse 04/16/2016    Past Surgical History:  Procedure Laterality Date  . ABDOMINAL HYSTERECTOMY    . ANKLE SURGERY Right   . CHOLECYSTECTOMY    . CORONARY ANGIOPLASTY  02/2008   bare metal stent to LAD   . CORONARY STENT PLACEMENT    . KNEE ARTHROSCOPY    . ROTATOR CUFF REPAIR Bilateral   . THORACIC OUTLET SURGERY    . WRIST SURGERY       Current Outpatient Medications  Medication Sig Dispense Refill  . albuterol (ACCUNEB) 1.25 MG/3ML nebulizer solution Take 1 ampule by nebulization 3 (three) times daily as needed for wheezing.    Marland Kitchen albuterol (PROAIR HFA) 108 (90 Base) MCG/ACT inhaler Inhale 2 puffs into the lungs every 6 (six) hours as needed for wheezing or shortness of breath.    . ALPRAZolam (XANAX) 0.5 MG tablet Take 1  tablet (0.5 mg total) by mouth 2 (two) times daily as needed for anxiety. 60 tablet 0  . aspirin 81 MG tablet Take 81 mg by mouth daily.    Marland Kitchen atorvastatin (LIPITOR) 10 MG tablet TAKE 1 TABLET(10 MG) BY MOUTH DAILY 30 tablet 4  . azithromycin (ZITHROMAX) 250 MG tablet Take 2 tabs day 1, then 1 tab daily 6 each 0  . benzonatate (TESSALON) 200 MG capsule Take 1 capsule (200 mg total) by mouth 2 (two) times daily as needed for cough. 20 capsule 0  . budesonide-formoterol (SYMBICORT) 80-4.5 MCG/ACT inhaler Inhale 2 puffs into the lungs 2 (two) times daily. 1 Inhaler 11  . celecoxib (CELEBREX) 200 MG capsule Take 1 capsule (200 mg total) by mouth daily. 30 capsule 2  . clopidogrel (PLAVIX) 75 MG tablet TAKE 1 TABLET BY MOUTH DAILY 90 tablet 2  . furosemide (LASIX) 40 MG tablet Take 1 tablet (40 mg total) by mouth daily as needed for fluid. 30 tablet 6  . gabapentin (NEURONTIN) 300 MG capsule Take 300 mg by mouth 2 (two) times daily.    . INVOKANA 300 MG TABS tablet TAKE 1 TABLET(300 MG) BY MOUTH DAILY 90 tablet 0  . ipratropium (ATROVENT) 0.06 % nasal spray Place 2 sprays into both nostrils 4 (four) times  daily. 15 mL 0  . isosorbide mononitrate (IMDUR) 60 MG 24 hr tablet Take 1 tablet (60 mg total) by mouth daily. 90 tablet 2  . levothyroxine (SYNTHROID, LEVOTHROID) 112 MCG tablet Take 1 tablet (112 mcg total) by mouth daily. 90 tablet 3  . metFORMIN (GLUCOPHAGE) 500 MG tablet Take 1 tablet (500 mg total) by mouth daily. 90 tablet 2  . metoprolol succinate (TOPROL-XL) 50 MG 24 hr tablet TAKE 1 1/2 TABLETS BY MOUTH EVERY DAY 135 tablet 0  . nitrofurantoin, macrocrystal-monohydrate, (MACROBID) 100 MG capsule Take 1 capsule (100 mg total) by mouth 2 (two) times daily. (Patient not taking: Reported on 10/08/2017) 14 capsule 0  . nitroGLYCERIN (NITROSTAT) 0.4 MG SL tablet Place 1 tablet (0.4 mg total) under the tongue every 5 (five) minutes as needed for chest pain. 20 tablet 1   No current  facility-administered medications for this visit.     Allergies:   Clindamycin/lincomycin; Codeine; Dilaudid [hydromorphone hcl]; Iodides; Reglan [metoclopramide]; Reglan [metoclopramide]; Tramadol; Dilaudid [hydromorphone hcl]; and Tape    Social History:  The patient  reports that she has been smoking.  She has a 15.50 pack-year smoking history. she has never used smokeless tobacco. She reports that she does not drink alcohol or use drugs.   Family History:  The patient's family history includes Cancer in her mother; Diabetes in her son; Hypertension in her son.    ROS:  Please see the history of present illness.   Otherwise, review of systems are positive for knee pain.   All other systems are reviewed and negative.    PHYSICAL EXAM: VS:  There were no vitals taken for this visit. , BMI There is no height or weight on file to calculate BMI. GENERAL:  Well appearing.  Morbidly obese HEENT:  Pupils equal round and reactive, fundi not visualized, oral mucosa unremarkable NECK:  No jugular venous distention, waveform within normal limits, carotid upstroke brisk and symmetric, no bruits LYMPHATICS:  No cervical adenopathy LUNGS:  Clear to auscultation bilaterally HEART:  RRR.  PMI not displaced or sustained,S1 and S2 within normal limits, no S3, no S4, no clicks, no rubs, no murmurs ABD:  Flat, positive bowel sounds normal in frequency in pitch, no bruits, no rebound, no guarding, no midline pulsatile mass, no hepatomegaly, no splenomegaly EXT:  2 plus pulses throughout, no edema, no cyanosis no clubbing SKIN:  No rashes no nodules NEURO:  Cranial nerves II through XII grossly intact, motor grossly intact throughout PSYCH:  Cognitively intact, oriented to person place and time   EKG:  EKG is ordered today. The ekg ordered today demonstrates sinus rhythm rate 74 bpm. LPFB.   Recent Labs: 08/28/2017: Hemoglobin 19.1 Repeated and verified X2.; TSH 0.35 10/08/2017: ALT 17; BUN 19;  Creatinine 0.98; Potassium 4.1; Sodium 139 10/26/2017: Platelet Count 151    Lipid Panel    Component Value Date/Time   CHOL 115 08/28/2017 1006   TRIG 124.0 08/28/2017 1006   HDL 46.20 08/28/2017 1006   CHOLHDL 2 08/28/2017 1006   VLDL 24.8 08/28/2017 1006   LDLCALC 44 08/28/2017 1006   LDLDIRECT 57.0 11/27/2016 1605      Wt Readings from Last 3 Encounters:  10/29/17 (!) 303 lb 1.6 oz (137.5 kg)  10/15/17 (!) 300 lb 1.6 oz (136.1 kg)  10/08/17 295 lb 12.8 oz (134.2 kg)      ASSESSMENT AND PLAN:  # CAD s/p BMS to LAD: Ms. Koziel is doing well and denies any angina.  She has been on DAPT since her stent placement in 2009. It would be reasonable to stop the clopidogrel.  However, given that she has been stable and denies any bleeding, she would prefer to continue it.  Continue Imdur and metoprolol.  It can be held for procedures if necessary.  # Hypertensive heart disease: BP well-controlled.  Continue metoprolol and Imdur.   # Hyperlipidemia: Continue atorvastatin.  Check lipids and CMP today.    # Morbid obesity: Ms. Stiggers was encouraged to start back her exercise when able.    # Tobacco abuse: Patient was encouraged to keep cutting back.   Current medicines are reviewed at length with the patient today.  The patient does not have concerns regarding medicines.  The following changes have been made:  no change  Labs/ tests ordered today include:   No orders of the defined types were placed in this encounter.    Disposition:   FU with Vannia Pola C. Oval Linsey, MD, [facility] in 1 year  This note was written with the assistance of speech recognition software.  Please excuse any transcriptional errors.  Signed, Jariyah Hackley C. Oval Linsey, MD, [facility]  11/17/2017 8:39 AM    [facility]

## 2017-11-22 NOTE — Progress Notes (Signed)
[facility] Follow-up Visit:  Assessment: Polycythemia, secondary 67 y.o. Female with history of obstructive sleep apnea and long-term smoking habit presenting with progressively elevated hemoglobin of at least 2-year duration.  Results of evaluation demonstrated erythropoietin in the higher normal range making primary hematological disorder significantly less likely.  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  At this time, patient's hematocrit exceeds 55% and thus places patient at elevated risk of heart attack or stroke.  With that in mind, I do recommend patient to proceed with therapeutic phlebotomy to bring hematocrit to or below 55% mark.  Patient received a single therapeutic phlebotomy on 10/22/17 with hematocrit today down to 52.2%.  Additional evaluation with ultrasound of the abdomen demonstrates no evidence of hepatic or renal mass although it is not the most sensitive study to find evidence of hepatic cell carcinoma or renal cell carcinoma.  CT of the abdomen pelvis with better, but the study was denied by insurance.  Plan: -No need for phlebotomy today  -Return to clinic in 1 month with labs, clinic visit, and possible therapeutic phlebotomy if hematocrit rises above 55%.  Voice recognition software was used and creation of this note. Despite my best effort at editing the text, some misspelling/errors may have occurred.  Orders Placed This Encounter  Procedures  . CBC with Differential (Cancer Center Only)    Standing Status:   Future    Standing Expiration Date:   10/29/2018    Cancer Staging No matching staging information was found for the patient.  All questions were answered.  . The patient knows to call the clinic with any problems, questions or concerns.  This note was electronically signed.    History of Presenting Illness [name] [name] is a 67 y.o. female followed in the [facility] for  evaluation of suspected polycythemia, referred by Dr Briscoe Deutscher.  Patient's past medical history is significant for  obstructive sleep apnea, diabetes mellitus type 2, coronary artery disease with previous history of PCI and stenting, hypertension, hyperlipidemia, morbid obesity, chronic bronchitis.  Patient is a chronic smoker currently smoking approximately 0.5 packs/day for the past 31 years.  No active alcohol or recreational substance use.  Family history significant for mesothelioma secondary to asbestos exposure in the mother and no history of hematological disorders, lymphoma, or leukemia.  At this time, patient's complaints include decrease in memory.  Otherwise she reports feeling and functioning reasonably well.  She was recently diagnosed with a sinus infection last Friday and is currently receiving antibiotic therapy for that.  She did have pulmonary function tests in July of last year reportedly showing no evidence of COPD, and subsequently was diagnosed with obstructive sleep apnea after undergoing polysomnography.  It is unclear if patient is using CPAP at this point in time.  Patient returns to the clinic to review lab work findings from the last visit.  She denies any new complaints.  Oncological/hematological History: --Labs, 03/10/16: WBC   9.8,                Hgb 16.1, Hct     ...,        Plt 143 --Labs, 01/28/17: WBC   8.3,                Hgb 17.8, Hct 53.7,        Plt 177; --Labs, 05/27/17: WBC   6.8,  Hgb 17.5, Hct 54.0,        Plt 162 --Labs, 08/28/17: WBC   7.4,                Hgb 19.1, Hct 58.4,        Plt 165 --Labs, 10/08/17: WBC 10.2, ANC 6.5, ALC 2.6, Mono 0.8, Hgb 18.9, Hct 58.1, MCV 99.0, MCH 23.2, RDW 14.9, Plt 146; Epo 15.3  --Treatment:    --Therapeutic phlebotomy: 10/22/17 --Labs, 10/26/17: WBC   7.4, Hgb 16.1, Hct 52.2, MCV 101.4, MCH 31.3, MCHC 30.8, RDW 15.2, Plt 151;  --US Abdomen, 10/26/17: Heterogeneously echogenic liver consistent with  hepatic steatosis, normal size of the spleen, no evidence of renal masses.   Medical History: Past Medical History:  Diagnosis Date  . Bronchitis, asthmatic   . Coronary artery disease    BMS to LAD 02/2008  . Diabetes mellitus without complication ([place])   . Hyperlipidemia   . Hypertensive heart disease 04/16/2016  . Hypothyroidism   . MI, old   . Morbid obesity ([place]) 04/16/2016  . S/P coronary artery stent placement 04/16/2016   BMS to LAD 2009  . Tobacco abuse 04/16/2016    Surgical History: Past Surgical History:  Procedure Laterality Date  . ABDOMINAL HYSTERECTOMY    . ANKLE SURGERY Right   . CHOLECYSTECTOMY    . CORONARY ANGIOPLASTY  02/2008   bare metal stent to LAD   . CORONARY STENT PLACEMENT    . KNEE ARTHROSCOPY    . ROTATOR CUFF REPAIR Bilateral   . THORACIC OUTLET SURGERY    . WRIST SURGERY      Family History: Family History  Problem Relation Age of Onset  . Cancer Mother   . Diabetes Son   . Hypertension Son     Social History: Social History   Socioeconomic History  . Marital status: Married    Spouse name: Not on file  . Number of children: Not on file  . Years of education: Not on file  . Highest education level: Not on file  Social Needs  . Financial resource strain: Not on file  . Food insecurity - worry: Not on file  . Food insecurity - inability: Not on file  . Transportation needs - medical: Not on file  . Transportation needs - non-medical: Not on file  Occupational History  . Not on file  Tobacco Use  . Smoking status: Current Every Day Smoker    Packs/day: 0.50    Years: 31.00    Pack years: 15.50  . Smokeless tobacco: Never Used  Substance and Sexual Activity  . Alcohol use: No  . Drug use: No  . Sexual activity: Yes    Partners: Male  Other Topics Concern  . Not on file  Social History Narrative        Allergies: Allergies  Allergen Reactions  . Clindamycin/Lincomycin Other (See Comments)    Head ache   . Codeine  Hives  . Dilaudid [Hydromorphone Hcl] Nausea And Vomiting  . Iodides   . Reglan [Metoclopramide] Other (See Comments)    Other reaction(s): Dystonia Hallucinations/Violent  . Reglan [Metoclopramide]   . Tramadol   . Dilaudid [Hydromorphone Hcl] Nausea And Vomiting  . Tape Rash    Blisters     Medications:  Current Outpatient Medications  Medication Sig Dispense Refill  . albuterol (ACCUNEB) 1.25 MG/3ML nebulizer solution Take 1 ampule by nebulization 3 (three) times daily as needed for wheezing.    Marland Kitchen albuterol (PROAIR HFA)  108 (90 Base) MCG/ACT inhaler Inhale 2 puffs into the lungs every 6 (six) hours as needed for wheezing or shortness of breath.    . ALPRAZolam (XANAX) 0.5 MG tablet Take 1 tablet (0.5 mg total) by mouth 2 (two) times daily as needed for anxiety. 60 tablet 0  . aspirin 81 MG tablet Take 81 mg by mouth daily.    Marland Kitchen atorvastatin (LIPITOR) 10 MG tablet TAKE 1 TABLET(10 MG) BY MOUTH DAILY 30 tablet 4  . azithromycin (ZITHROMAX) 250 MG tablet Take 2 tabs day 1, then 1 tab daily 6 each 0  . benzonatate (TESSALON) 200 MG capsule Take 1 capsule (200 mg total) by mouth 2 (two) times daily as needed for cough. 20 capsule 0  . budesonide-formoterol (SYMBICORT) 80-4.5 MCG/ACT inhaler Inhale 2 puffs into the lungs 2 (two) times daily. 1 Inhaler 11  . celecoxib (CELEBREX) 200 MG capsule Take 1 capsule (200 mg total) by mouth daily. 30 capsule 2  . clopidogrel (PLAVIX) 75 MG tablet TAKE 1 TABLET BY MOUTH DAILY 90 tablet 2  . furosemide (LASIX) 40 MG tablet Take 1 tablet (40 mg total) by mouth daily as needed for fluid. 30 tablet 6  . gabapentin (NEURONTIN) 300 MG capsule Take 300 mg by mouth 2 (two) times daily.    . INVOKANA 300 MG TABS tablet TAKE 1 TABLET(300 MG) BY MOUTH DAILY 90 tablet 0  . ipratropium (ATROVENT) 0.06 % nasal spray Place 2 sprays into both nostrils 4 (four) times daily. 15 mL 0  . isosorbide mononitrate (IMDUR) 60 MG 24 hr tablet Take 1 tablet (60 mg total) by  mouth daily. 90 tablet 2  . levothyroxine (SYNTHROID, LEVOTHROID) 112 MCG tablet Take 1 tablet (112 mcg total) by mouth daily. 90 tablet 3  . metFORMIN (GLUCOPHAGE) 500 MG tablet Take 1 tablet (500 mg total) by mouth daily. 90 tablet 2  . metoprolol succinate (TOPROL-XL) 50 MG 24 hr tablet TAKE 1 1/2 TABLETS BY MOUTH EVERY DAY 135 tablet 0  . nitrofurantoin, macrocrystal-monohydrate, (MACROBID) 100 MG capsule Take 1 capsule (100 mg total) by mouth 2 (two) times daily. (Patient not taking: Reported on 10/08/2017) 14 capsule 0  . nitroGLYCERIN (NITROSTAT) 0.4 MG SL tablet Place 1 tablet (0.4 mg total) under the tongue every 5 (five) minutes as needed for chest pain. 20 tablet 1   No current facility-administered medications for this visit.     Review of Systems: Review of Systems  Constitutional: Positive for fatigue.  Psychiatric/Behavioral: Positive for decreased concentration.  All other systems reviewed and are negative.    PHYSICAL EXAMINATION Blood pressure 112/66, pulse 61, temperature 98.2 F (36.8 C), temperature source Oral, resp. rate 17, height 5\' 5"  (1.651 m), weight (!) 303 lb 1.6 oz (137.5 kg), SpO2 95 %.  ECOG PERFORMANCE STATUS: 1 - Symptomatic but completely ambulatory  Physical Exam  Constitutional: She is oriented to person, place, and time and well-developed, well-nourished, and in no distress. No distress.  HENT:  Head: Normocephalic and atraumatic.  Mouth/Throat: Oropharynx is clear and moist. No oropharyngeal exudate.  Eyes: Conjunctivae and EOM are normal. Pupils are equal, round, and reactive to light. No scleral icterus.  Neck: No thyromegaly present.  Cardiovascular: Normal rate, regular rhythm and normal heart sounds.  No murmur heard. Pulmonary/Chest: Effort normal and breath sounds normal. No respiratory distress. She has no wheezes. She has no rales.  Abdominal: Soft. Bowel sounds are normal. She exhibits no distension and no mass. There is no  tenderness.  There is no guarding.  Musculoskeletal: She exhibits no edema.  Lymphadenopathy:    She has no cervical adenopathy.  Neurological: She is alert and oriented to person, place, and time. She has normal reflexes. No cranial nerve deficit.  Skin: Skin is warm and dry. No rash noted. She is not diaphoretic. No erythema. No pallor.     LABORATORY DATA: I have personally reviewed the data as listed: Appointment on 10/26/2017  Component Date Value Ref Range Status  . WBC Count 10/26/2017 7.4  3.9 - 10.3 K/uL Final  . RBC 10/26/2017 5.15  3.70 - 5.45 MIL/uL Final  . Hemoglobin 10/26/2017 16.1* 11.6 - 15.9 g/dL Final  . HCT 10/26/2017 52.2* 34.8 - 46.6 % Final  . MCV 10/26/2017 101.4* 79.5 - 101.0 fL Final  . MCH 10/26/2017 31.3  25.1 - 34.0 pg Final  . MCHC 10/26/2017 30.8* 31.5 - 36.0 g/dL Final  . RDW 10/26/2017 15.2* 11.2 - 14.5 % Final  . Platelet Count 10/26/2017 151  145 - 400 K/uL Final  . Neutrophils Relative % 10/26/2017 60  % Final  . Neutro Abs 10/26/2017 4.4  1.5 - 6.5 K/uL Final  . Lymphocytes Relative 10/26/2017 30  % Final  . Lymphs Abs 10/26/2017 2.2  0.9 - 3.3 K/uL Final  . Monocytes Relative 10/26/2017 7  % Final  . Monocytes Absolute 10/26/2017 0.5  0.1 - 0.9 K/uL Final  . Eosinophils Relative 10/26/2017 3  % Final  . Eosinophils Absolute 10/26/2017 0.3  0.0 - 0.5 K/uL Final  . Basophils Relative 10/26/2017 0  % Final  . Basophils Absolute 10/26/2017 0.0  0.0 - 0.1 K/uL Final   Performed at [facility], [place] [address] [place], [place] [address]       Ardath Sax, MD

## 2017-11-22 NOTE — Assessment & Plan Note (Signed)
67 y.o. Female with history of obstructive sleep apnea and long-term smoking habit presenting with progressively elevated hemoglobin of at least 2-year duration.  Results of evaluation demonstrated erythropoietin in the higher normal range making primary hematological disorder significantly less likely.  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  At this time, patient's hematocrit exceeds 55% and thus places patient at elevated risk of heart attack or stroke.  With that in mind, I do recommend patient to proceed with therapeutic phlebotomy to bring hematocrit to or below 55% mark.  Patient received a single therapeutic phlebotomy on 10/22/17 with hematocrit today down to 52.2%.  Additional evaluation with ultrasound of the abdomen demonstrates no evidence of hepatic or renal mass although it is not the most sensitive study to find evidence of hepatic cell carcinoma or renal cell carcinoma.  CT of the abdomen pelvis with better, but the study was denied by insurance.  Plan: -No need for phlebotomy today  -Return to clinic in 1 month with labs, clinic visit, and possible therapeutic phlebotomy if hematocrit rises above 55%.

## 2017-11-25 ENCOUNTER — Encounter: Payer: Self-pay | Admitting: Hematology and Oncology

## 2017-11-25 ENCOUNTER — Ambulatory Visit: Payer: Medicare Other | Admitting: Hematology

## 2017-11-25 ENCOUNTER — Inpatient Hospital Stay: Payer: Medicare Other

## 2017-11-25 ENCOUNTER — Other Ambulatory Visit: Payer: Medicare Other

## 2017-11-25 ENCOUNTER — Inpatient Hospital Stay: Payer: Medicare Other | Attending: Hematology and Oncology | Admitting: Hematology and Oncology

## 2017-11-25 VITALS — BP 115/72 | HR 64 | Temp 98.0°F | Resp 18 | Ht 65.0 in | Wt 311.5 lb

## 2017-11-25 DIAGNOSIS — D751 Secondary polycythemia: Secondary | ICD-10-CM | POA: Insufficient documentation

## 2017-11-25 DIAGNOSIS — G4733 Obstructive sleep apnea (adult) (pediatric): Secondary | ICD-10-CM | POA: Diagnosis not present

## 2017-11-25 DIAGNOSIS — Z720 Tobacco use: Secondary | ICD-10-CM

## 2017-11-25 DIAGNOSIS — D582 Other hemoglobinopathies: Secondary | ICD-10-CM | POA: Diagnosis present

## 2017-11-25 DIAGNOSIS — I10 Essential (primary) hypertension: Secondary | ICD-10-CM | POA: Insufficient documentation

## 2017-11-25 DIAGNOSIS — E119 Type 2 diabetes mellitus without complications: Secondary | ICD-10-CM | POA: Insufficient documentation

## 2017-11-25 LAB — CBC WITH DIFFERENTIAL (CANCER CENTER ONLY)
Basophils Absolute: 0 10*3/uL (ref 0.0–0.1)
Basophils Relative: 0 %
EOS ABS: 0.2 10*3/uL (ref 0.0–0.5)
Eosinophils Relative: 3 %
HEMATOCRIT: 51.3 % — AB (ref 34.8–46.6)
HEMOGLOBIN: 16.1 g/dL — AB (ref 11.6–15.9)
LYMPHS ABS: 2.1 10*3/uL (ref 0.9–3.3)
LYMPHS PCT: 26 %
MCH: 31.6 pg (ref 25.1–34.0)
MCHC: 31.4 g/dL — ABNORMAL LOW (ref 31.5–36.0)
MCV: 100.8 fL (ref 79.5–101.0)
MONOS PCT: 6 %
Monocytes Absolute: 0.5 10*3/uL (ref 0.1–0.9)
NEUTROS ABS: 5.2 10*3/uL (ref 1.5–6.5)
NEUTROS PCT: 65 %
Platelet Count: 147 10*3/uL (ref 145–400)
RBC: 5.09 MIL/uL (ref 3.70–5.45)
RDW: 15.4 % — ABNORMAL HIGH (ref 11.2–14.5)
WBC Count: 8 10*3/uL (ref 3.9–10.3)

## 2017-12-01 ENCOUNTER — Ambulatory Visit: Payer: Medicare Other | Admitting: Cardiovascular Disease

## 2017-12-01 ENCOUNTER — Other Ambulatory Visit: Payer: Self-pay | Admitting: Family Medicine

## 2017-12-01 ENCOUNTER — Ambulatory Visit (INDEPENDENT_AMBULATORY_CARE_PROVIDER_SITE_OTHER): Payer: Medicare Other | Admitting: Family Medicine

## 2017-12-01 ENCOUNTER — Encounter: Payer: Self-pay | Admitting: Cardiovascular Disease

## 2017-12-01 ENCOUNTER — Encounter: Payer: Self-pay | Admitting: Family Medicine

## 2017-12-01 VITALS — BP 124/72 | HR 79 | Temp 98.6°F | Wt 310.0 lb

## 2017-12-01 VITALS — BP 112/62 | HR 74 | Ht 65.0 in | Wt 309.0 lb

## 2017-12-01 DIAGNOSIS — Z720 Tobacco use: Secondary | ICD-10-CM | POA: Diagnosis not present

## 2017-12-01 DIAGNOSIS — I2510 Atherosclerotic heart disease of native coronary artery without angina pectoris: Secondary | ICD-10-CM

## 2017-12-01 DIAGNOSIS — E119 Type 2 diabetes mellitus without complications: Secondary | ICD-10-CM | POA: Diagnosis not present

## 2017-12-01 DIAGNOSIS — Z955 Presence of coronary angioplasty implant and graft: Secondary | ICD-10-CM

## 2017-12-01 DIAGNOSIS — I119 Hypertensive heart disease without heart failure: Secondary | ICD-10-CM

## 2017-12-01 DIAGNOSIS — E785 Hyperlipidemia, unspecified: Secondary | ICD-10-CM

## 2017-12-01 DIAGNOSIS — F17210 Nicotine dependence, cigarettes, uncomplicated: Secondary | ICD-10-CM

## 2017-12-01 DIAGNOSIS — D751 Secondary polycythemia: Secondary | ICD-10-CM | POA: Diagnosis not present

## 2017-12-01 DIAGNOSIS — E039 Hypothyroidism, unspecified: Secondary | ICD-10-CM

## 2017-12-01 LAB — COMPREHENSIVE METABOLIC PANEL
ALT: 14 U/L (ref 0–35)
AST: 12 U/L (ref 0–37)
Albumin: 3.8 g/dL (ref 3.5–5.2)
Alkaline Phosphatase: 75 U/L (ref 39–117)
BUN: 15 mg/dL (ref 6–23)
CO2: 33 mEq/L — ABNORMAL HIGH (ref 19–32)
Calcium: 10.1 mg/dL (ref 8.4–10.5)
Chloride: 100 mEq/L (ref 96–112)
Creatinine, Ser: 0.87 mg/dL (ref 0.40–1.20)
GFR: 69.57 mL/min (ref >60.00)
Glucose, Bld: 146 mg/dL — ABNORMAL HIGH (ref 70–99)
Potassium: 4.2 mEq/L (ref 3.5–5.1)
Sodium: 139 mEq/L (ref 135–145)
Total Bilirubin: 0.8 mg/dL (ref 0.2–1.2)
Total Protein: 7.1 g/dL (ref 6.0–8.3)

## 2017-12-01 LAB — CBC WITH DIFFERENTIAL/PLATELET
Basophils Absolute: 0 10*3/uL (ref 0.0–0.1)
Basophils Relative: 0.6 % (ref 0.0–3.0)
Eosinophils Absolute: 0.2 10*3/uL (ref 0.0–0.7)
Eosinophils Relative: 2.5 % (ref 0.0–5.0)
HCT: 50.7 % — ABNORMAL HIGH (ref 36.0–46.0)
Hemoglobin: 16.8 g/dL — ABNORMAL HIGH (ref 12.0–15.0)
Lymphocytes Relative: 25.2 % (ref 12.0–46.0)
Lymphs Abs: 2 10*3/uL (ref 0.7–4.0)
MCHC: 33.1 g/dL (ref 30.0–36.0)
MCV: 97.1 fl (ref 78.0–100.0)
Monocytes Absolute: 0.6 10*3/uL (ref 0.1–1.0)
Monocytes Relative: 7 % (ref 3.0–12.0)
Neutro Abs: 5.1 10*3/uL (ref 1.4–7.7)
Neutrophils Relative %: 64.7 % (ref 43.0–77.0)
Platelets: 167 10*3/uL (ref 150.0–400.0)
RBC: 5.22 Mil/uL — ABNORMAL HIGH (ref 3.87–5.11)
RDW: 15.7 % — ABNORMAL HIGH (ref 11.5–15.5)
WBC: 7.9 10*3/uL (ref 4.0–10.5)

## 2017-12-01 LAB — TSH: TSH: 0.39 u[IU]/mL (ref 0.35–4.50)

## 2017-12-01 LAB — HEMOGLOBIN A1C: Hgb A1c MFr Bld: 6.7 % — ABNORMAL HIGH (ref 4.6–6.5)

## 2017-12-01 MED ORDER — CELECOXIB 200 MG PO CAPS: 200.0000 mg | ORAL_CAPSULE | Freq: Every day | ORAL | 2 refills | Status: DC

## 2017-12-01 MED ORDER — POTASSIUM CHLORIDE CRYS ER 20 MEQ PO TBCR: 20.0000 meq | EXTENDED_RELEASE_TABLET | Freq: Every day | ORAL | 3 refills | Status: DC

## 2017-12-01 MED ORDER — IPRATROPIUM BROMIDE 0.06 % NA SOLN: 2.0000 | Freq: Four times a day (QID) | NASAL | 0 refills | Status: DC

## 2017-12-01 NOTE — Progress Notes (Signed)
[name] [name] is a 67 y.o. female here for an acute visit.  History of Present Illness:   [name], CMA acting as scribe for [name].   HPI: Patient in for three month follow up. No new problems. She will need blood work done from follow up from [name].    PMHx, SurgHx, SocialHx, Medications, and Allergies were reviewed in the Visit Navigator and updated as appropriate.  Review of Systems  Constitutional: Negative for chills and fever.  HENT: Negative for hearing loss and tinnitus.   Eyes: Negative for blurred vision and double vision.  Respiratory: Negative for cough.   Cardiovascular: Negative for chest pain and palpitations.  Gastrointestinal: Negative for heartburn and nausea.  Genitourinary: Negative for dysuria and urgency.  Musculoskeletal: Negative for myalgias and neck pain.  Skin: Negative for rash.  Neurological: Negative for dizziness and headaches.  Endo/Heme/Allergies: Does not bruise/bleed easily.   Current Medications:   .  albuterol (ACCUNEB) 1.25 MG/3ML nebulizer solution, Take 1 ampule by nebulization 3 (three) times daily as needed for wheezing., Disp: , Rfl:  .  albuterol (PROAIR HFA) 108 (90 Base) MCG/ACT inhaler, Inhale 2 puffs into the lungs every 6 (six) hours as needed for wheezing or shortness of breath., Disp: , Rfl:  .  ALPRAZolam (XANAX) 0.5 MG tablet, Take 1 tablet (0.5 mg total) by mouth 2 (two) times daily as needed for anxiety., Disp: 60 tablet, Rfl: 0 .  aspirin 81 MG tablet, Take 81 mg by mouth daily., Disp: , Rfl:  .  atorvastatin (LIPITOR) 10 MG tablet, TAKE 1 TABLET(10 MG) BY MOUTH DAILY, Disp: 30 tablet, Rfl: 4 .  budesonide-formoterol (SYMBICORT) 80-4.5 MCG/ACT inhaler, Inhale 2 puffs into the lungs 2 (two) times daily., Disp: 1 Inhaler, Rfl: 11 .  celecoxib (CELEBREX) 200 MG capsule, Take 1 capsule (200 mg total) by mouth daily., Disp: 30 capsule, Rfl: 2 .  clopidogrel (PLAVIX) 75 MG tablet, TAKE 1 TABLET BY MOUTH DAILY,  Disp: 90 tablet, Rfl: 2 .  furosemide (LASIX) 40 MG tablet, Take 1 tablet (40 mg total) by mouth daily as needed for fluid., Disp: 30 tablet, Rfl: 6 .  gabapentin (NEURONTIN) 300 MG capsule, Take 300 mg by mouth 2 (two) times daily., Disp: , Rfl:  .  INVOKANA 300 MG TABS tablet, TAKE 1 TABLET(300 MG) BY MOUTH DAILY, Disp: 90 tablet, Rfl: 0 .  ipratropium (ATROVENT) 0.06 % nasal spray, Place 2 sprays into both nostrils 4 (four) times daily., Disp: 15 mL, Rfl: 0 .  isosorbide mononitrate (IMDUR) 60 MG 24 hr tablet, Take 1 tablet (60 mg total) by mouth daily., Disp: 90 tablet, Rfl: 2 .  levothyroxine (SYNTHROID, LEVOTHROID) 112 MCG tablet, Take 1 tablet (112 mcg total) by mouth daily., Disp: 90 tablet, Rfl: 3 .  metFORMIN (GLUCOPHAGE) 500 MG tablet, Take 1 tablet (500 mg total) by mouth daily., Disp: 90 tablet, Rfl: 2 .  metoprolol succinate (TOPROL-XL) 50 MG 24 hr tablet, TAKE 1 1/2 TABLETS BY MOUTH EVERY DAY, Disp: 135 tablet, Rfl: 0 .  nitroGLYCERIN (NITROSTAT) 0.4 MG SL tablet, Place 1 tablet (0.4 mg total) under the tongue every 5 (five) minutes as needed for chest pain., Disp: 20 tablet, Rfl: 1   Allergies  Allergen Reactions  . Clindamycin/Lincomycin Other (See Comments)    Head ache   . Codeine Hives  . Dilaudid [Hydromorphone Hcl] Nausea And Vomiting  . Iodides   . Reglan [Metoclopramide] Other (See Comments)    Other reaction(s): Dystonia  Hallucinations/Violent  . Reglan [Metoclopramide]   . Tramadol   . Dilaudid [Hydromorphone Hcl] Nausea And Vomiting  . Tape Rash    Blisters    Review of Systems:   Pertinent items are noted in the HPI. Otherwise, ROS is negative.  Vitals:   Vitals:   12/01/17 1033  BP: 124/72  Pulse: 79  Temp: 98.6 F (37 C)  TempSrc: Oral  SpO2: 94%  Weight: (!) 310 lb (140.6 kg)     Body mass index is 51.59 kg/m.  Physical Exam:   Physical Exam  Constitutional: She is oriented to person, place, and time. She appears well-developed and  well-nourished. No distress.  HENT:  Head: Normocephalic and atraumatic.  Right Ear: External ear normal.  Left Ear: External ear normal.  Nose: Nose normal.  Mouth/Throat: Oropharynx is clear and moist.  Eyes: Conjunctivae and EOM are normal. Pupils are equal, round, and reactive to light.  Neck: Normal range of motion. Neck supple.  Cardiovascular: Normal rate, regular rhythm, normal heart sounds and intact distal pulses.  Pulmonary/Chest: Effort normal. She has decreased breath sounds. She has no wheezes. She has no rhonchi. She has no rales.  Abdominal: Soft. Bowel sounds are normal.  Neurological: She is alert and oriented to person, place, and time.  Skin: Skin is warm.  Psychiatric: She has a normal mood and affect. Her behavior is normal.  Nursing note and vitals reviewed.   Results for orders placed or performed in visit on 12/01/17  TSH  Result Value Ref Range   TSH 0.39 0.35 - 4.50 uIU/mL  Hemoglobin A1c  Result Value Ref Range   Hgb A1c MFr Bld 6.7 (H) 4.6 - 6.5 %  CBC with Differential/Platelet  Result Value Ref Range   WBC 7.9 4.0 - 10.5 K/uL   RBC 5.22 (H) 3.87 - 5.11 Mil/uL   Hemoglobin 16.8 (H) 12.0 - 15.0 g/dL   HCT 50.7 (H) 36.0 - 46.0 %   MCV 97.1 78.0 - 100.0 fl   MCHC 33.1 30.0 - 36.0 g/dL   RDW 15.7 (H) 11.5 - 15.5 %   Platelets 167.0 150.0 - 400.0 K/uL   Neutrophils Relative % 64.7 43.0 - 77.0 %   Lymphocytes Relative 25.2 12.0 - 46.0 %   Monocytes Relative 7.0 3.0 - 12.0 %   Eosinophils Relative 2.5 0.0 - 5.0 %   Basophils Relative 0.6 0.0 - 3.0 %   Neutro Abs 5.1 1.4 - 7.7 K/uL   Lymphs Abs 2.0 0.7 - 4.0 K/uL   Monocytes Absolute 0.6 0.1 - 1.0 K/uL   Eosinophils Absolute 0.2 0.0 - 0.7 K/uL   Basophils Absolute 0.0 0.0 - 0.1 K/uL  Comprehensive metabolic panel  Result Value Ref Range   Sodium 139 135 - 145 mEq/L   Potassium 4.2 3.5 - 5.1 mEq/L   Chloride 100 96 - 112 mEq/L   CO2 33 (H) 19 - 32 mEq/L   Glucose, Bld 146 (H) 70 - 99 mg/dL    BUN 15 6 - 23 mg/dL   Creatinine, Ser 0.87 0.40 - 1.20 mg/dL   Total Bilirubin 0.8 0.2 - 1.2 mg/dL   Alkaline Phosphatase 75 39 - 117 U/L   AST 12 0 - 37 U/L   ALT 14 0 - 35 U/L   Total Protein 7.1 6.0 - 8.3 g/dL   Albumin 3.8 3.5 - 5.2 g/dL   Calcium 10.1 8.4 - 10.5 mg/dL   GFR 69.57 >60.00 mL/min   Assessment and Plan:  1. Acquired hypothyroidism Patient with long-standing hypothyroidism, on levothyroxine therapy. She appears euthyroid.We discussed about correct intake of levothyroxine, fasting, with water, separated by at least 30 minutes from breakfast, and separated by more than 4 hours from calcium, iron, multivitamins, acid reflux medications (PPIs). Will check thyroid tests today: TSH, free T4.  If labs today are abnormal, she will need to return in ~6 weeks for repeat labs. Otherwise, I will see her back in 3 months.  - TSH  2. Diabetes mellitus without complication (HCC) IMPROVING. Current symptoms: no polyuria or polydipsia, no numbness, tingling or pain in extremities.   Lab Results  Component Value Date   HGBA1C 6.7 (H) 12/01/2017    Lab Results  Component Value Date   MICROALBUR <0.7 12/09/2016    Lab Results  Component Value Date   CHOL 115 08/28/2017   HDL 46.20 08/28/2017   LDLCALC 44 08/28/2017   LDLDIRECT 57.0 11/27/2016   TRIG 124.0 08/28/2017   CHOLHDL 2 08/28/2017     Wt Readings from Last 3 Encounters:  12/01/17 (!) 309 lb (140.2 kg)  12/01/17 (!) 310 lb (140.6 kg)  11/25/17 (!) 311 lb 8 oz (141.3 kg)   BP Readings from Last 3 Encounters:  12/01/17 112/62  12/01/17 124/72  11/25/17 115/72   Lab Results  Component Value Date   CREATININE 0.87 12/01/2017   - Hemoglobin A1c - CBC with Differential/Platelet - Comprehensive metabolic panel  3. Polycythemia, secondary  CBC Latest Ref Rng & Units 12/01/2017 11/25/2017 10/26/2017  WBC 4.0 - 10.5 K/uL 7.9 8.0 7.4  Hemoglobin 12.0 - 15.0 g/dL 16.8(H) - -  Hematocrit 36.0 - 46.0 % 50.7(H) 51.3(H)  52.2(H)  Platelets 150.0 - 400.0 K/uL 167.0 147 151   4. Cigarette nicotine dependence without complication The patient was counseled on the dangers of tobacco use, and was advised to quit.  Reviewed strategies to maximize success, including removing cigarettes and smoking materials from environment, stress management, support of family/friends, written materials, local smoking cessation programs (1-800-QUIT-NOW and SMOKEFREE.GOV) and pharmacotherapy. Greater than (3) minutes were spent on counseling today.  5. Morbid (severe) obesity due to excess calories ([place])  Wt Readings from Last 3 Encounters:  12/01/17 (!) 309 lb (140.2 kg)  12/01/17 (!) 310 lb (140.6 kg)  11/25/17 (!) 311 lb 8 oz (141.3 kg)   The patient is asked to make an attempt to improve diet and exercise patterns to aid in medical management of this problem.    . Reviewed expectations re: course of current medical issues. . Discussed self-management of symptoms. . Outlined signs and symptoms indicating need for more acute intervention. . Patient verbalized understanding and all questions were answered. Marland Kitchen Health Maintenance issues including appropriate healthy diet, exercise, and smoking avoidance were discussed with patient. . See orders for this visit as documented in the electronic medical record. . Patient received an After Visit Summary.  CMA served as Education administrator during this visit. History, Physical, and Plan performed by medical provider. The above documentation has been reviewed and is accurate and complete. Briscoe Deutscher, D.O.   Briscoe Deutscher, DO [place], [facility] 12/04/2017

## 2017-12-01 NOTE — Addendum Note (Signed)
Addended by: Alvina Filbert B on: 12/01/2017 05:31 PM   Modules accepted: Orders

## 2017-12-01 NOTE — Progress Notes (Signed)
Cardiology Office Note   Date:  12/01/2017   ID:  [name], [name] [birth year], MRN [id number]  PCP:  Briscoe Deutscher, DO  Cardiologist:   Skeet Latch, MD   Chief Complaint  Patient presents with  . Shortness of Breath    1 year.     History of Present Illness: [name] [name] is a 67 y.o. female with CAD s/p PCI, COPD, morbid obesity, chronic bronchitis, and hypothyroidism who presents to for follow up.  While living in [place] she had a BMS in the LAD 02/2008.  Since then she has done very well.  She had a 2 day Persantine Cardiolite on 01/07/13 that was negative for ischemia. LVEF was 79%.  She denies any recent chest pain or shortness of breath. She is the primary caretaker of her 48 year old grandson who has ADD. Her husband also suffers from posttraumatic stress disorder.  Since her last appointment [name] has been doing well.  She has not been getting much exercise lately.  She is limited by pain in her knees.  She does have access to a pool and looks forward to using it this summer.  She denies chest pain or shortness of breath.  She has been seen by pulmonologist and reports that she does not have COPD.  She was started on Symbicort and reports that this has helped her breathing.  She denies any lower extremity edema, orthopnea, or PND.  She uses Lasix approximately once every 2 or 3 weeks.  She is working to cut back on her smoking.  She is down to smoking 5 cigarettes daily from 1 pack/day.  She intermittently uses a patch.  She hopes to be done with cigarettes by the end of the month.  She notes that her diet has been poor.  She was eating a bag of chips daily but hasn't had any in the last 3 weeks.  She is also using Slim fast meal replacement shakes twice daily.  She was able to lose 60 pounds with this strategy in the past.   Past Medical History:  Diagnosis Date  . Bronchitis, asthmatic   . Coronary artery disease    BMS to LAD 02/2008  . Diabetes mellitus without complication ([place])    . Hyperlipidemia   . Hypertensive heart disease 04/16/2016  . Hypothyroidism   . MI, old   . Morbid obesity ([place]) 04/16/2016  . S/P coronary artery stent placement 04/16/2016   BMS to LAD 2009  . Tobacco abuse 04/16/2016    Past Surgical History:  Procedure Laterality Date  . ABDOMINAL HYSTERECTOMY    . ANKLE SURGERY Right   . CHOLECYSTECTOMY    . CORONARY ANGIOPLASTY  02/2008   bare metal stent to LAD   . CORONARY STENT PLACEMENT    . KNEE ARTHROSCOPY    . ROTATOR CUFF REPAIR Bilateral   . THORACIC OUTLET SURGERY    . WRIST SURGERY       Current Outpatient Medications  Medication Sig Dispense Refill  . albuterol (ACCUNEB) 1.25 MG/3ML nebulizer solution Take 1 ampule by nebulization 3 (three) times daily as needed for wheezing.    Marland Kitchen albuterol (PROAIR HFA) 108 (90 Base) MCG/ACT inhaler Inhale 2 puffs into the lungs every 6 (six) hours as needed for wheezing or shortness of breath.    . ALPRAZolam (XANAX) 0.5 MG tablet Take 1 tablet (0.5 mg total) by mouth 2 (two) times daily as needed for anxiety. 60 tablet 0  . aspirin 81 MG  tablet Take 81 mg by mouth daily.    Marland Kitchen atorvastatin (LIPITOR) 10 MG tablet TAKE 1 TABLET(10 MG) BY MOUTH DAILY 30 tablet 4  . budesonide-formoterol (SYMBICORT) 80-4.5 MCG/ACT inhaler Inhale 2 puffs into the lungs 2 (two) times daily. 1 Inhaler 11  . celecoxib (CELEBREX) 200 MG capsule Take 1 capsule (200 mg total) by mouth daily. 30 capsule 2  . clopidogrel (PLAVIX) 75 MG tablet TAKE 1 TABLET BY MOUTH DAILY 90 tablet 2  . furosemide (LASIX) 40 MG tablet Take 1 tablet (40 mg total) by mouth daily as needed for fluid. 30 tablet 6  . gabapentin (NEURONTIN) 300 MG capsule Take 300 mg by mouth 2 (two) times daily.    . INVOKANA 300 MG TABS tablet TAKE 1 TABLET(300 MG) BY MOUTH DAILY 90 tablet 0  . ipratropium (ATROVENT) 0.06 % nasal spray USE 2 SPRAYS IN EACH NOSTRIL FOUR TIMES DAILY 135 mL 0  . isosorbide mononitrate (IMDUR) 60 MG 24 hr tablet Take 1 tablet (60  mg total) by mouth daily. 90 tablet 2  . levothyroxine (SYNTHROID, LEVOTHROID) 112 MCG tablet Take 1 tablet (112 mcg total) by mouth daily. 90 tablet 3  . metFORMIN (GLUCOPHAGE) 500 MG tablet Take 1 tablet (500 mg total) by mouth daily. 90 tablet 2  . metoprolol succinate (TOPROL-XL) 50 MG 24 hr tablet TAKE 1 1/2 TABLETS BY MOUTH EVERY DAY 135 tablet 0  . nitroGLYCERIN (NITROSTAT) 0.4 MG SL tablet Place 1 tablet (0.4 mg total) under the tongue every 5 (five) minutes as needed for chest pain. 20 tablet 1  . potassium chloride SA (K-DUR,KLOR-CON) 20 MEQ tablet Take 1 tablet (20 mEq total) by mouth daily. 30 tablet 3   No current facility-administered medications for this visit.     Allergies:   Clindamycin/lincomycin; Codeine; Dilaudid [hydromorphone hcl]; Iodides; Reglan [metoclopramide]; Reglan [metoclopramide]; Tramadol; Dilaudid [hydromorphone hcl]; and Tape    Social History:  The patient  reports that she has been smoking.  She has a 15.50 pack-year smoking history. she has never used smokeless tobacco. She reports that she does not drink alcohol or use drugs.   Family History:  The patient's family history includes Cancer in her mother; Diabetes in her son; Hypertension in her son.    ROS:  Please see the history of present illness.   Otherwise, review of systems are positive for none.   All other systems are reviewed and negative.    PHYSICAL EXAM: VS:  BP 112/62   Pulse 74   Ht 5\' 5"  (1.651 m)   Wt (!) 309 lb (140.2 kg)   BMI 51.42 kg/m  , BMI Body mass index is 51.42 kg/m. GENERAL:  Morbidly obese.  Well appearing HEENT: Pupils equal round and reactive, fundi not visualized, oral mucosa unremarkable NECK:  No jugular venous distention, waveform within normal limits, carotid upstroke brisk and symmetric, no bruits LUNGS:  Clear to auscultation bilaterally HEART:  RRR.  PMI not displaced or sustained,S1 and S2 within normal limits, no S3, no S4, no clicks, no rubs, no  murmurs ABD:  Flat, positive bowel sounds normal in frequency in pitch, no bruits, no rebound, no guarding, no midline pulsatile mass, no hepatomegaly, no splenomegaly EXT:  2 plus pulses throughout, no edema, no cyanosis no clubbing SKIN:  No rashes no nodules NEURO:  Cranial nerves II through XII grossly intact, motor grossly intact throughout PSYCH:  Cognitively intact, oriented to person place and time    EKG:  EKG is ordered  today. The ekg ordered 10/31/16 demonstrates sinus rhythm rate 74 bpm. LPFB. 12/01/17: Sinus rhythm.  Rate 74 bpm.  Low voltage limb leads   And precordial leads.   Recent Labs: 12/01/2017: ALT 14; BUN 15; Creatinine, Ser 0.87; Hemoglobin 16.8; Platelets 167.0; Potassium 4.2; Sodium 139; TSH 0.39    Lipid Panel    Component Value Date/Time   CHOL 115 08/28/2017 1006   TRIG 124.0 08/28/2017 1006   HDL 46.20 08/28/2017 1006   CHOLHDL 2 08/28/2017 1006   VLDL 24.8 08/28/2017 1006   LDLCALC 44 08/28/2017 1006   LDLDIRECT 57.0 11/27/2016 1605      Wt Readings from Last 3 Encounters:  12/01/17 (!) 309 lb (140.2 kg)  12/01/17 (!) 310 lb (140.6 kg)  11/25/17 (!) 311 lb 8 oz (141.3 kg)      ASSESSMENT AND PLAN:  # CAD s/p BMS to LAD: Ms. Minteer continues to do well.  Continue aspirin, clopidogrel, atorvastatin, metoprolol, and isosorbide.  We previously discussed stopping clopidogrel but she wants to continue it.    # Hypertensive heart disease: BP well-controlled on metoprolol and Imdur.   # Hyperlipidemia: Continue atorvastatin.  LDL 57 3/32018.  She will have this repeated with her PCP.   # Morbid obesity: Ms. West was encouraged to start back her exercise and use the pool given her knee pain.   # Tobacco abuse: Patient was encouraged to keep cutting back.   Current medicines are reviewed at length with the patient today.  The patient does not have concerns regarding medicines.  The following changes have been made:  no change  Labs/ tests ordered  today include:   No orders of the defined types were placed in this encounter.    Disposition:   FU with Kynli Chou C. Oval Linsey, MD, [facility] in 1 year  This note was written with the assistance of speech recognition software.  Please excuse any transcriptional errors.  Signed, Cashius Grandstaff C. Oval Linsey, MD, [facility]  12/01/2017 1:28 PM    [place]

## 2017-12-01 NOTE — Patient Instructions (Signed)

## 2017-12-05 NOTE — Assessment & Plan Note (Signed)
67 y.o. Female with history of obstructive sleep apnea and long-term smoking habit presenting with progressively elevated hemoglobin of at least 2-year duration.  Results of evaluation demonstrated erythropoietin in the higher normal range making primary hematological disorder significantly less likely.  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  At this time, patient's hematocrit exceeds 55% and thus places patient at elevated risk of heart attack or stroke.  With that in mind, I do recommend patient to proceed with therapeutic phlebotomy to bring hematocrit to or below 55% mark.  Patient received a single therapeutic phlebotomy on 10/22/17 with a decreasing hematocrit since.  Labs today demonstrate no need to further phlebotomy.  Plan: -No need for phlebotomy today  -Patient will continue efforts to stop smoking. -Follow-up with primary care provider.  I recommend blood counts checked at least twice a year with referral of the patient back to [place] if hematocrit rises above 55% for additional phlebotomy.

## 2017-12-05 NOTE — Progress Notes (Signed)
[place] [facility] Follow-up Visit:  Assessment: Polycythemia, secondary 67 y.o. Female with history of obstructive sleep apnea and long-term smoking habit presenting with progressively elevated hemoglobin of at least 2-year duration.  Results of evaluation demonstrated erythropoietin in the higher normal range making primary hematological disorder significantly less likely.  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  At this time, patient's hematocrit exceeds 55% and thus places patient at elevated risk of heart attack or stroke.  With that in mind, I do recommend patient to proceed with therapeutic phlebotomy to bring hematocrit to or below 55% mark.  Patient received a single therapeutic phlebotomy on 10/22/17 with a decreasing hematocrit since.  Labs today demonstrate no need to further phlebotomy.  Plan: -No need for phlebotomy today  -Patient will continue efforts to stop smoking. -Follow-up with primary care provider.  I recommend blood counts checked at least twice a year with referral of the patient back to [place] if hematocrit rises above 55% for additional phlebotomy.  Voice recognition software was used and creation of this note. Despite my best effort at editing the text, some misspelling/errors may have occurred.  No orders of the defined types were placed in this encounter.   Cancer Staging No matching staging information was found for the patient.  All questions were answered.  . The patient knows to call the clinic with any problems, questions or concerns.  This note was electronically signed.    History of Presenting Illness [name] [name] is a 67 y.o. female followed in the [place] for evaluation of suspected polycythemia, referred by Dr Briscoe Deutscher.  Patient's past medical history is significant for  obstructive sleep apnea, diabetes mellitus type 2, coronary artery disease with previous history  of PCI and stenting, hypertension, hyperlipidemia, morbid obesity, chronic bronchitis.  Patient is a chronic smoker currently smoking approximately 0.5 packs/day for the past 31 years.  No active alcohol or recreational substance use.  Family history significant for mesothelioma secondary to asbestos exposure in the mother and no history of hematological disorders, lymphoma, or leukemia.  At this time, patient's complaints include decrease in memory.  Otherwise she reports feeling and functioning reasonably well.  She was recently diagnosed with a sinus infection last Friday and is currently receiving antibiotic therapy for that.  She did have pulmonary function tests in July of last year reportedly showing no evidence of COPD, and subsequently was diagnosed with obstructive sleep apnea after undergoing polysomnography.  It is unclear if patient is using CPAP at this point in time.  Patient returns to the clinic to review lab work findings from the last visit.  She denies any new complaints.  Oncological/hematological History: --Labs, 03/10/16: WBC   9.8,                Hgb 16.1, Hct     ...,        Plt 143 --Labs, 01/28/17: WBC   8.3,                Hgb 17.8, Hct 53.7,        Plt 177; --Labs, 05/27/17: WBC   6.8,                Hgb 17.5, Hct 54.0,        Plt 162 --Labs, 08/28/17: WBC   7.4,                Hgb 19.1, Hct 58.4,  Plt 165 --Labs, 10/08/17: WBC 10.2, ANC 6.5, ALC 2.6, Mono 0.8, Hgb 18.9, Hct 58.1, MCV 99.0, MCH 23.2, RDW 14.9, Plt 146; Epo 15.3  --Treatment:    --Therapeutic phlebotomy: 10/22/17 --Labs, 10/26/17: WBC   7.4, Hgb 16.1, Hct 52.2, MCV 101.4, MCH 31.3, MCHC 30.8, RDW 15.2, Plt 151;  --US Abdomen, 10/26/17: Heterogeneously echogenic liver consistent with hepatic steatosis, normal size of the spleen, no evidence of renal masses. --Labs, 11/25/17: WBC   8.0, Hgb 16.1, Hct 51.3, MCV 100.8, MCH 31.6, MCHC 31.4, RDW 15.4, Plt 147;   Medical History: Past Medical History:   Diagnosis Date  . Bronchitis, asthmatic   . Coronary artery disease    BMS to LAD 02/2008  . Diabetes mellitus without complication ([place])   . Hyperlipidemia   . Hypertensive heart disease 04/16/2016  . Hypothyroidism   . MI, old   . Morbid obesity ([place]) 04/16/2016  . S/P coronary artery stent placement 04/16/2016   BMS to LAD 2009  . Tobacco abuse 04/16/2016    Surgical History: Past Surgical History:  Procedure Laterality Date  . ABDOMINAL HYSTERECTOMY    . ANKLE SURGERY Right   . CHOLECYSTECTOMY    . CORONARY ANGIOPLASTY  02/2008   bare metal stent to LAD   . CORONARY STENT PLACEMENT    . KNEE ARTHROSCOPY    . ROTATOR CUFF REPAIR Bilateral   . THORACIC OUTLET SURGERY    . WRIST SURGERY      Family History: Family History  Problem Relation Age of Onset  . Cancer Mother   . Diabetes Son   . Hypertension Son     Social History: Social History   Socioeconomic History  . Marital status: Married    Spouse name: Not on file  . Number of children: Not on file  . Years of education: Not on file  . Highest education level: Not on file  Social Needs  . Financial resource strain: Not on file  . Food insecurity - worry: Not on file  . Food insecurity - inability: Not on file  . Transportation needs - medical: Not on file  . Transportation needs - non-medical: Not on file  Occupational History  . Not on file  Tobacco Use  . Smoking status: Current Every Day Smoker    Packs/day: 0.50    Years: 31.00    Pack years: 15.50  . Smokeless tobacco: Never Used  Substance and Sexual Activity  . Alcohol use: No  . Drug use: No  . Sexual activity: Yes    Partners: Male  Other Topics Concern  . Not on file  Social History Narrative        Allergies: Allergies  Allergen Reactions  . Clindamycin/Lincomycin Other (See Comments)    Head ache   . Codeine Hives  . Dilaudid [Hydromorphone Hcl] Nausea And Vomiting  . Iodides   . Reglan [Metoclopramide] Other (See Comments)     Other reaction(s): Dystonia Hallucinations/Violent  . Reglan [Metoclopramide]   . Tramadol   . Dilaudid [Hydromorphone Hcl] Nausea And Vomiting  . Tape Rash    Blisters     Medications:  Current Outpatient Medications  Medication Sig Dispense Refill  . albuterol (ACCUNEB) 1.25 MG/3ML nebulizer solution Take 1 ampule by nebulization 3 (three) times daily as needed for wheezing.    Marland Kitchen albuterol (PROAIR HFA) 108 (90 Base) MCG/ACT inhaler Inhale 2 puffs into the lungs every 6 (six) hours as needed for wheezing or shortness of breath.    Marland Kitchen  ALPRAZolam (XANAX) 0.5 MG tablet Take 1 tablet (0.5 mg total) by mouth 2 (two) times daily as needed for anxiety. 60 tablet 0  . aspirin 81 MG tablet Take 81 mg by mouth daily.    Marland Kitchen atorvastatin (LIPITOR) 10 MG tablet TAKE 1 TABLET(10 MG) BY MOUTH DAILY 30 tablet 4  . budesonide-formoterol (SYMBICORT) 80-4.5 MCG/ACT inhaler Inhale 2 puffs into the lungs 2 (two) times daily. 1 Inhaler 11  . celecoxib (CELEBREX) 200 MG capsule Take 1 capsule (200 mg total) by mouth daily. 30 capsule 2  . clopidogrel (PLAVIX) 75 MG tablet TAKE 1 TABLET BY MOUTH DAILY 90 tablet 2  . furosemide (LASIX) 40 MG tablet Take 1 tablet (40 mg total) by mouth daily as needed for fluid. 30 tablet 6  . gabapentin (NEURONTIN) 300 MG capsule Take 300 mg by mouth 2 (two) times daily.    . INVOKANA 300 MG TABS tablet TAKE 1 TABLET(300 MG) BY MOUTH DAILY 90 tablet 0  . ipratropium (ATROVENT) 0.06 % nasal spray USE 2 SPRAYS IN EACH NOSTRIL FOUR TIMES DAILY 135 mL 0  . isosorbide mononitrate (IMDUR) 60 MG 24 hr tablet Take 1 tablet (60 mg total) by mouth daily. 90 tablet 2  . levothyroxine (SYNTHROID, LEVOTHROID) 112 MCG tablet Take 1 tablet (112 mcg total) by mouth daily. 90 tablet 3  . metFORMIN (GLUCOPHAGE) 500 MG tablet Take 1 tablet (500 mg total) by mouth daily. 90 tablet 2  . metoprolol succinate (TOPROL-XL) 50 MG 24 hr tablet TAKE 1 1/2 TABLETS BY MOUTH EVERY DAY 135 tablet 0  .  nitroGLYCERIN (NITROSTAT) 0.4 MG SL tablet Place 1 tablet (0.4 mg total) under the tongue every 5 (five) minutes as needed for chest pain. 20 tablet 1  . potassium chloride SA (K-DUR,KLOR-CON) 20 MEQ tablet Take 1 tablet (20 mEq total) by mouth daily. 30 tablet 3   No current facility-administered medications for this visit.     Review of Systems: Review of Systems  Constitutional: Positive for fatigue.  Psychiatric/Behavioral: Positive for decreased concentration.  All other systems reviewed and are negative.    PHYSICAL EXAMINATION Blood pressure 115/72, pulse 64, temperature 98 F (36.7 C), temperature source Oral, resp. rate 18, height 5\' 5"  (1.651 m), weight (!) 311 lb 8 oz (141.3 kg), SpO2 97 %.  ECOG PERFORMANCE STATUS: 1 - Symptomatic but completely ambulatory  Physical Exam  Constitutional: She is oriented to person, place, and time and well-developed, well-nourished, and in no distress. No distress.  HENT:  Head: Normocephalic and atraumatic.  Mouth/Throat: Oropharynx is clear and moist. No oropharyngeal exudate.  Eyes: Conjunctivae and EOM are normal. Pupils are equal, round, and reactive to light. No scleral icterus.  Neck: No thyromegaly present.  Cardiovascular: Normal rate, regular rhythm and normal heart sounds.  No murmur heard. Pulmonary/Chest: Effort normal and breath sounds normal. No respiratory distress. She has no wheezes. She has no rales.  Abdominal: Soft. Bowel sounds are normal. She exhibits no distension and no mass. There is no tenderness. There is no guarding.  Musculoskeletal: She exhibits no edema.  Lymphadenopathy:    She has no cervical adenopathy.  Neurological: She is alert and oriented to person, place, and time. She has normal reflexes. No cranial nerve deficit.  Skin: Skin is warm and dry. No rash noted. She is not diaphoretic. No erythema. No pallor.     LABORATORY DATA: I have personally reviewed the data as listed: Appointment on  11/25/2017  Component Date Value Ref Range  Status  . WBC Count 11/25/2017 8.0  3.9 - 10.3 K/uL Final  . RBC 11/25/2017 5.09  3.70 - 5.45 MIL/uL Final  . Hemoglobin 11/25/2017 16.1* 11.6 - 15.9 g/dL Final  . HCT 11/25/2017 51.3* 34.8 - 46.6 % Final  . MCV 11/25/2017 100.8  79.5 - 101.0 fL Final  . MCH 11/25/2017 31.6  25.1 - 34.0 pg Final  . MCHC 11/25/2017 31.4* 31.5 - 36.0 g/dL Final  . RDW 11/25/2017 15.4* 11.2 - 14.5 % Final  . Platelet Count 11/25/2017 147  145 - 400 K/uL Final  . Neutrophils Relative % 11/25/2017 65  % Final  . Neutro Abs 11/25/2017 5.2  1.5 - 6.5 K/uL Final  . Lymphocytes Relative 11/25/2017 26  % Final  . Lymphs Abs 11/25/2017 2.1  0.9 - 3.3 K/uL Final  . Monocytes Relative 11/25/2017 6  % Final  . Monocytes Absolute 11/25/2017 0.5  0.1 - 0.9 K/uL Final  . Eosinophils Relative 11/25/2017 3  % Final  . Eosinophils Absolute 11/25/2017 0.2  0.0 - 0.5 K/uL Final  . Basophils Relative 11/25/2017 0  % Final  . Basophils Absolute 11/25/2017 0.0  0.0 - 0.1 K/uL Final   Performed at [facility], [place] [address], [place], [place] [address]       Ardath Sax, MD

## 2017-12-31 ENCOUNTER — Other Ambulatory Visit: Payer: Self-pay | Admitting: Family Medicine

## 2017-12-31 DIAGNOSIS — F419 Anxiety disorder, unspecified: Secondary | ICD-10-CM

## 2017-12-31 MED ORDER — POTASSIUM CHLORIDE CRYS ER 20 MEQ PO TBCR: 20.0000 meq | EXTENDED_RELEASE_TABLET | Freq: Every day | ORAL | 3 refills | Status: DC

## 2017-12-31 MED ORDER — FUROSEMIDE 40 MG PO TABS: 40.0000 mg | ORAL_TABLET | Freq: Every day | ORAL | 6 refills | Status: DC | PRN

## 2017-12-31 MED ORDER — ALPRAZOLAM 0.5 MG PO TABS: 0.5000 mg | ORAL_TABLET | Freq: Two times a day (BID) | ORAL | 0 refills | Status: DC | PRN

## 2017-12-31 NOTE — Telephone Encounter (Signed)
I have meds pulled up ok to refill?

## 2017-12-31 NOTE — Telephone Encounter (Signed)
Copied from [place]. Topic: Quick Communication - See Telephone Encounter >> Dec 31, 2017 11:31 AM Ahmed Prima L wrote: CRM for notification. See Telephone encounter for: 12/31/17.  furosemide (LASIX) 40 MG tablet ( patient said that it has expired in feb & she needs a new script ) ALPRAZolam (XANAX) 0.5 MG tablet     [facility] [place], [place] - [address] AT [address]

## 2017-12-31 NOTE — Telephone Encounter (Signed)
Refill request for lasix, prescription expired on 11/03/17. Refill request for xanax, controlled med, last refilled 11/09/17.  Provider:  Hyman Hopes  LOV  12/01/17 NOV  03/15/18  Pharmacy   [facility]  [phone]

## 2018-01-02 ENCOUNTER — Other Ambulatory Visit: Payer: Self-pay | Admitting: Family Medicine

## 2018-01-02 DIAGNOSIS — F419 Anxiety disorder, unspecified: Secondary | ICD-10-CM

## 2018-01-04 ENCOUNTER — Telehealth: Payer: Self-pay | Admitting: Family Medicine

## 2018-01-04 NOTE — Telephone Encounter (Signed)
Pt. Is requesting a pill for vaginal itching.  Reported she has had vaginal itching since Friday or Saturday.  Reported she has not tried any OTC creams.  Feels the pill works better.  Denied any vaginal discharge.    Also reported the pharmacy has not rec'd an Rx for Alprazolam. Stated she is going out of town on Thursday, and is hoping to get the Rx before she leaves.  Noted the Alprazolam 0.5 mg tablet; #60; no RF (Rx PRINTED) ; ordered on 12/31/17.  Advised will send message to Dr. Juleen China.  Agrees with plan.

## 2018-01-04 NOTE — Telephone Encounter (Signed)
Copied from [place]. Topic: Quick Communication - See Telephone Encounter >> Dec 31, 2017 11:31 AM Ahmed Prima L wrote: CRM for notification. See Telephone encounter for: 12/31/17.  furosemide (LASIX) 40 MG tablet ( patient said that it has expired in feb & she needs a new script ) ALPRAZolam (XANAX) 0.5 MG tablet (no refills)  [facility] [place], [place] - [address] AT [address] >> Jan 04, 2018 10:07 AM Cleaster Corin, NT wrote: Pt. Calling back and stated that xanax wasn't at the pharmacy to be picked up but she has received other meds. Pt. Also stated that she needs something (pill) for vaginal itch and not a cream.   [facility] [place], [address]  [place] [address]  Phone: [phone] Fax: [phone]

## 2018-01-05 ENCOUNTER — Other Ambulatory Visit: Payer: Self-pay

## 2018-01-05 DIAGNOSIS — F419 Anxiety disorder, unspecified: Secondary | ICD-10-CM

## 2018-01-05 MED ORDER — FLUCONAZOLE 150 MG PO TABS: 150.0000 mg | ORAL_TABLET | Freq: Once | ORAL | 0 refills | Status: AC

## 2018-01-05 MED ORDER — ALPRAZOLAM 0.5 MG PO TABS: 0.5000 mg | ORAL_TABLET | Freq: Two times a day (BID) | ORAL | 0 refills | Status: DC | PRN

## 2018-01-05 NOTE — Telephone Encounter (Signed)
Script did not go electronically for some reason do you want me to print an fax?

## 2018-01-05 NOTE — Telephone Encounter (Signed)
Okay for both. Okay to fax Xanax.

## 2018-01-05 NOTE — Telephone Encounter (Signed)
Called patient let her know we were sending and all have been sent to pharmacy.

## 2018-01-18 ENCOUNTER — Telehealth: Payer: Self-pay | Admitting: Cardiovascular Disease

## 2018-01-18 MED ORDER — METOPROLOL SUCCINATE ER 50 MG PO TB24: ORAL_TABLET | ORAL | 2 refills | Status: DC

## 2018-01-18 MED ORDER — ISOSORBIDE MONONITRATE ER 60 MG PO TB24: 60.0000 mg | ORAL_TABLET | Freq: Every day | ORAL | 2 refills | Status: DC

## 2018-01-18 MED ORDER — ATORVASTATIN CALCIUM 10 MG PO TABS: ORAL_TABLET | ORAL | 2 refills | Status: DC

## 2018-01-18 MED ORDER — CLOPIDOGREL BISULFATE 75 MG PO TABS: 75.0000 mg | ORAL_TABLET | Freq: Every day | ORAL | 2 refills | Status: DC

## 2018-01-18 NOTE — Telephone Encounter (Signed)
New Message    *STAT* If patient is at the pharmacy, call can be transferred to refill team.   1. Which medications need to be refilled? (please list name of each medication and dose if known) atorvastatin (LIPITOR) 10 MG tablet, clopidogrel (PLAVIX) 75 MG tablet, and metoprolol succinate (TOPROL-XL) 50 MG 24 hr tablet  2. Which pharmacy/location (including street and city if local pharmacy) is medication to be sent to? [place]  3. Do they need a 30 day or 90 day supply? 90 day supply

## 2018-02-01 ENCOUNTER — Encounter: Payer: Self-pay | Admitting: Family Medicine

## 2018-02-01 ENCOUNTER — Ambulatory Visit: Payer: Self-pay | Admitting: *Deleted

## 2018-02-01 ENCOUNTER — Ambulatory Visit (INDEPENDENT_AMBULATORY_CARE_PROVIDER_SITE_OTHER): Payer: Medicare Other | Admitting: Family Medicine

## 2018-02-01 VITALS — BP 120/68 | HR 71 | Temp 98.1°F | Ht 65.0 in | Wt 318.2 lb

## 2018-02-01 DIAGNOSIS — J4541 Moderate persistent asthma with (acute) exacerbation: Secondary | ICD-10-CM | POA: Diagnosis not present

## 2018-02-01 DIAGNOSIS — E039 Hypothyroidism, unspecified: Secondary | ICD-10-CM | POA: Diagnosis not present

## 2018-02-01 DIAGNOSIS — R5383 Other fatigue: Secondary | ICD-10-CM | POA: Diagnosis not present

## 2018-02-01 DIAGNOSIS — I25708 Atherosclerosis of coronary artery bypass graft(s), unspecified, with other forms of angina pectoris: Secondary | ICD-10-CM

## 2018-02-01 DIAGNOSIS — R600 Localized edema: Secondary | ICD-10-CM

## 2018-02-01 DIAGNOSIS — J449 Chronic obstructive pulmonary disease, unspecified: Secondary | ICD-10-CM

## 2018-02-01 DIAGNOSIS — G63 Polyneuropathy in diseases classified elsewhere: Secondary | ICD-10-CM

## 2018-02-01 DIAGNOSIS — F17210 Nicotine dependence, cigarettes, uncomplicated: Secondary | ICD-10-CM | POA: Diagnosis not present

## 2018-02-01 DIAGNOSIS — E119 Type 2 diabetes mellitus without complications: Secondary | ICD-10-CM | POA: Diagnosis not present

## 2018-02-01 LAB — COMPREHENSIVE METABOLIC PANEL
ALT: 13 U/L (ref 0–35)
AST: 14 U/L (ref 0–37)
Albumin: 3.8 g/dL (ref 3.5–5.2)
Alkaline Phosphatase: 95 U/L (ref 39–117)
BUN: 12 mg/dL (ref 6–23)
CO2: 31 mEq/L (ref 19–32)
Calcium: 9.6 mg/dL (ref 8.4–10.5)
Chloride: 100 mEq/L (ref 96–112)
Creatinine, Ser: 0.9 mg/dL (ref 0.40–1.20)
GFR: 66.87 mL/min (ref >60.00)
Glucose, Bld: 127 mg/dL — ABNORMAL HIGH (ref 70–99)
Potassium: 4.8 mEq/L (ref 3.5–5.1)
Sodium: 139 mEq/L (ref 135–145)
Total Bilirubin: 0.7 mg/dL (ref 0.2–1.2)
Total Protein: 7.3 g/dL (ref 6.0–8.3)

## 2018-02-01 LAB — T4, FREE: Free T4: 1.09 ng/dL (ref 0.60–1.60)

## 2018-02-01 LAB — CBC WITH DIFFERENTIAL/PLATELET
Basophils Absolute: 0.1 10*3/uL (ref 0.0–0.1)
Basophils Relative: 0.7 % (ref 0.0–3.0)
Eosinophils Absolute: 0.2 10*3/uL (ref 0.0–0.7)
Eosinophils Relative: 1.9 % (ref 0.0–5.0)
HCT: 53.2 % — ABNORMAL HIGH (ref 36.0–46.0)
Hemoglobin: 17.5 g/dL — ABNORMAL HIGH (ref 12.0–15.0)
Lymphocytes Relative: 28.3 % (ref 12.0–46.0)
Lymphs Abs: 2.6 10*3/uL (ref 0.7–4.0)
MCHC: 32.8 g/dL (ref 30.0–36.0)
MCV: 96.8 fl (ref 78.0–100.0)
Monocytes Absolute: 0.7 10*3/uL (ref 0.1–1.0)
Monocytes Relative: 7.3 % (ref 3.0–12.0)
Neutro Abs: 5.6 10*3/uL (ref 1.4–7.7)
Neutrophils Relative %: 61.8 % (ref 43.0–77.0)
Platelets: 175 10*3/uL (ref 150.0–400.0)
RBC: 5.5 Mil/uL — ABNORMAL HIGH (ref 3.87–5.11)
RDW: 16 % — ABNORMAL HIGH (ref 11.5–15.5)
WBC: 9.1 10*3/uL (ref 4.0–10.5)

## 2018-02-01 LAB — TSH: TSH: 0.59 u[IU]/mL (ref 0.35–4.50)

## 2018-02-01 LAB — MAGNESIUM: Magnesium: 2 mg/dL (ref 1.5–2.5)

## 2018-02-01 LAB — BRAIN NATRIURETIC PEPTIDE: Pro B Natriuretic peptide (BNP): 51 pg/mL (ref 0.0–100.0)

## 2018-02-01 MED ORDER — GABAPENTIN 300 MG PO CAPS
300.0000 mg | ORAL_CAPSULE | Freq: Three times a day (TID) | ORAL | 1 refills | Status: DC
Start: 2018-02-01 — End: 2018-05-13

## 2018-02-01 MED ORDER — PREDNISONE 5 MG PO TABS: ORAL_TABLET | ORAL | 0 refills | Status: DC

## 2018-02-01 MED ORDER — PROMETHAZINE-CODEINE 6.25-10 MG/5ML PO SYRP: 5.0000 mL | ORAL_SOLUTION | Freq: Four times a day (QID) | ORAL | 0 refills | Status: DC | PRN

## 2018-02-01 MED ORDER — INVOKANA 300 MG PO TABS: ORAL_TABLET | ORAL | 0 refills | Status: DC

## 2018-02-01 MED ORDER — DOXYCYCLINE HYCLATE 100 MG PO TABS: 100.0000 mg | ORAL_TABLET | Freq: Two times a day (BID) | ORAL | 0 refills | Status: DC

## 2018-02-01 NOTE — Telephone Encounter (Signed)
Pt states she has experienced a productive cough that worsens at night since Friday. Pt states she is coughing up yellow sputum and has been trying to use nasal spray with no improvement. Pt states that the coughing gets so bad at night that she has to sit up in the recliner to sleep.  Pt states she is also having wheezing and has been using her nebulizer and inhalers. Pt states that she does have a history of chronic bronchitis and asthma. Pt denies SOB at this time and states she only has it after coughing. Pt scheduled for appt today with Dr. Juleen China at 2:20pm.  Reason for Disposition . Wheezing is present  Answer Assessment - Initial Assessment Questions 1. ONSET: "When did the cough begin?"      Friday 2. SEVERITY: "How bad is the cough today?"      On and off  3. RESPIRATORY DISTRESS: "Describe your breathing."      SOB with coughing 4. FEVER: "Do you have a fever?" If so, ask: "What is your temperature, how was it measured, and when did it start?"     No aware of  5. SPUTUM: "Describe the color of your sputum" (clear, white, yellow, green)     yellow 6. HEMOPTYSIS: "Are you coughing up any blood?" If so ask: "How much?" (flecks, streaks, tablespoons, etc.)     No blood 7. CARDIAC HISTORY: "Do you have any history of heart disease?" (e.g., heart attack, congestive heart failure)      Heart attack had a stent placed 10 years ago 8. LUNG HISTORY: "Do you have any history of lung disease?"  (e.g., pulmonary embolus, asthma, emphysema)  Chronic bronchitis and asthma 9. PE RISK FACTORS: "Do you have a history of blood clots?" (or: recent major surgery, recent prolonged travel, bedridden )     No 10. OTHER SYMPTOMS: "Do you have any other symptoms?" (e.g., runny nose, wheezing, chest pain)       wheezing 11. PREGNANCY: "Is there any chance you are pregnant?" "When was your last menstrual period?"       No, had a hysterectomy 12. TRAVEL: "Have you traveled out of the country in the last  month?" (e.g., travel history, exposures)       No  Protocols used: [facility]

## 2018-02-01 NOTE — Progress Notes (Signed)
[name] [name] is a 67 y.o. female here for an acute visit.  History of Present Illness:   Cough  This is a new problem. The current episode started in the past 7 days. The problem has been gradually worsening. The problem occurs constantly. The cough is productive of sputum. Associated symptoms include a fever, headaches, nasal congestion, postnasal drip, shortness of breath and wheezing. Pertinent negatives include no chest pain, chills, ear congestion or ear pain. Treatments tried: nebulzer and inhaler  The treatment provided no relief. Her past medical history is significant for asthma.   Patient last used nebulizer at 2pm and and used albuterol inhaler in office now.    PMHx, SurgHx, SocialHx, Medications, and Allergies were reviewed in the Visit Navigator and updated as appropriate.  Current Medications:   Current Outpatient Medications:  .  albuterol (ACCUNEB) 1.25 MG/3ML nebulizer solution, Take 1 ampule by nebulization 3 (three) times daily as needed for wheezing., Disp: , Rfl:  .  albuterol (PROAIR HFA) 108 (90 Base) MCG/ACT inhaler, Inhale 2 puffs into the lungs every 6 (six) hours as needed for wheezing or shortness of breath., Disp: , Rfl:  .  ALPRAZolam (XANAX) 0.5 MG tablet, Take 1 tablet (0.5 mg total) by mouth 2 (two) times daily as needed for anxiety., Disp: 60 tablet, Rfl: 0 .  aspirin 81 MG tablet, Take 81 mg by mouth daily., Disp: , Rfl:  .  atorvastatin (LIPITOR) 10 MG tablet, TAKE 1 TABLET(10 MG) BY MOUTH DAILY, Disp: 90 tablet, Rfl: 2 .  budesonide-formoterol (SYMBICORT) 80-4.5 MCG/ACT inhaler, Inhale 2 puffs into the lungs 2 (two) times daily., Disp: 1 Inhaler, Rfl: 11 .  celecoxib (CELEBREX) 200 MG capsule, Take 1 capsule (200 mg total) by mouth daily., Disp: 30 capsule, Rfl: 2 .  clopidogrel (PLAVIX) 75 MG tablet, Take 1 tablet (75 mg total) by mouth daily., Disp: 90 tablet, Rfl: 2 .  furosemide (LASIX) 40 MG tablet, Take 1 tablet (40 mg total) by mouth daily as  needed for fluid., Disp: 30 tablet, Rfl: 6 .  gabapentin (NEURONTIN) 300 MG capsule, Take 300 mg by mouth 2 (two) times daily., Disp: , Rfl:  .  INVOKANA 300 MG TABS tablet, TAKE 1 TABLET(300 MG) BY MOUTH DAILY, Disp: 90 tablet, Rfl: 0 .  ipratropium (ATROVENT) 0.06 % nasal spray, USE 2 SPRAYS IN EACH NOSTRIL FOUR TIMES DAILY, Disp: 135 mL, Rfl: 0 .  isosorbide mononitrate (IMDUR) 60 MG 24 hr tablet, Take 1 tablet (60 mg total) by mouth daily., Disp: 90 tablet, Rfl: 2 .  levothyroxine (SYNTHROID, LEVOTHROID) 112 MCG tablet, Take 1 tablet (112 mcg total) by mouth daily., Disp: 90 tablet, Rfl: 3 .  metFORMIN (GLUCOPHAGE) 500 MG tablet, Take 1 tablet (500 mg total) by mouth daily., Disp: 90 tablet, Rfl: 2 .  metoprolol succinate (TOPROL-XL) 50 MG 24 hr tablet, TAKE 1 1/2 TABLETS BY MOUTH EVERY DAY, Disp: 90 tablet, Rfl: 2 .  nitroGLYCERIN (NITROSTAT) 0.4 MG SL tablet, Place 1 tablet (0.4 mg total) under the tongue every 5 (five) minutes as needed for chest pain., Disp: 20 tablet, Rfl: 1 .  potassium chloride SA (K-DUR,KLOR-CON) 20 MEQ tablet, Take 1 tablet (20 mEq total) by mouth daily., Disp: 30 tablet, Rfl: 3   Allergies  Allergen Reactions  . Clindamycin/Lincomycin Other (See Comments)    Head ache   . Codeine Hives  . Dilaudid [Hydromorphone Hcl] Nausea And Vomiting  . Iodides   . Reglan [Metoclopramide] Other (See Comments)  Other reaction(s): Dystonia Hallucinations/Violent  . Reglan [Metoclopramide]   . Tramadol   . Dilaudid [Hydromorphone Hcl] Nausea And Vomiting  . Tape Rash    Blisters    Review of Systems:   Pertinent items are noted in the HPI. Otherwise, ROS is negative.  Vitals:   Vitals:   02/01/18 1424  BP: 120/68  Pulse: 71  Temp: 98.1 F (36.7 C)  TempSrc: Oral  SpO2: 94%  Weight: (!) 318 lb 3.2 oz (144.3 kg)  Height: 5\' 5"  (1.651 m)     Body mass index is 52.95 kg/m.  Physical Exam:   Physical Exam  Constitutional: She appears well-nourished.    HENT:  Head: Normocephalic and atraumatic.  Eyes: Pupils are equal, round, and reactive to light. EOM are normal.  Neck: Normal range of motion. Neck supple.  Cardiovascular: Normal rate, regular rhythm, normal heart sounds and intact distal pulses.  Pulmonary/Chest: Effort normal. She has wheezes. She has rhonchi.  Abdominal: Soft.  Musculoskeletal: She exhibits edema.  Skin: Skin is warm.  Psychiatric: She has a normal mood and affect. Her behavior is normal.  Nursing note and vitals reviewed.   Results for orders placed or performed in visit on 12/21/17  HM DIABETES EYE EXAM  Result Value Ref Range   HM Diabetic Eye Exam No Retinopathy No Retinopathy   Assessment and Plan:   [name] was seen today for cough.  Diagnoses and all orders for this visit:  Moderate persistent asthmatic bronchitis with acute exacerbation -     gabapentin (NEURONTIN) 300 MG capsule; Take 1 capsule (300 mg total) by mouth 3 (three) times daily. -     doxycycline (VIBRA-TABS) 100 MG tablet; Take 1 tablet (100 mg total) by mouth 2 (two) times daily. -     predniSONE (DELTASONE) 5 MG tablet; 6-5-4-3-2-1 -     promethazine-codeine (PHENERGAN WITH CODEINE) 6.25-10 MG/5ML syrup; Take 5 mLs by mouth every 6 (six) hours as needed for cough.  Acquired hypothyroidism -     T4, free -     TSH  Coronary artery disease of bypass graft of native heart with stable angina pectoris (HCC)  Polyneuropathy associated with underlying disease ([place])  Diabetes mellitus without complication (HCC) -     INVOKANA 300 MG TABS tablet; TAKE 1 TABLET(300 MG) BY MOUTH DAILY  Lower extremity edema -     CBC with Differential/Platelet -     Comprehensive metabolic panel -     Brain natriuretic peptide -     Magnesium  Cigarette nicotine dependence without complication Comments: Down to 2 cigarettes daily.  Reviewed the importance of cessation.  Morbid (severe) obesity due to excess calories [facility]) Comments: Reviewed the  importance of exercise and healthy food choices for weight loss.    . Reviewed expectations re: course of current medical issues. . Discussed self-management of symptoms. . Outlined signs and symptoms indicating need for more acute intervention. . Patient verbalized understanding and all questions were answered. Marland Kitchen Health Maintenance issues including appropriate healthy diet, exercise, and smoking avoidance were discussed with patient. . See orders for this visit as documented in the electronic medical record. . Patient received an After Visit Summary.  CMA served as Education administrator during this visit. History, Physical, and Plan performed by medical provider. The above documentation has been reviewed and is accurate and complete. Briscoe Deutscher, D.O.  Briscoe Deutscher, DO [place], [facility] 02/01/2018

## 2018-02-05 ENCOUNTER — Telehealth: Payer: Self-pay | Admitting: Family Medicine

## 2018-02-05 NOTE — Telephone Encounter (Signed)
See lab note.  

## 2018-02-05 NOTE — Telephone Encounter (Signed)
See note.   Copied from [place] [id number]. Topic: Quick Communication - Lab Results >> Feb 05, 2018  9:06 AM Lennox Solders wrote: Pt would like blood work results from [employer]

## 2018-03-01 ENCOUNTER — Telehealth: Payer: Self-pay | Admitting: Family Medicine

## 2018-03-01 MED ORDER — CELECOXIB 200 MG PO CAPS: 200.0000 mg | ORAL_CAPSULE | Freq: Every day | ORAL | 2 refills | Status: DC

## 2018-03-01 NOTE — Telephone Encounter (Signed)
Copied from [place] [id number]. Topic: Quick Communication - Rx Refill/Question >> Mar 01, 2018 12:28 PM Selinda Flavin B, NT wrote: Medication: celecoxib (CELEBREX) 200 MG capsule  Has the patient contacted their pharmacy? Yes.   (Agent: If no, request that the patient contact the pharmacy for the refill.) (Agent: If yes, when and what did the pharmacy advise?)  Preferred Pharmacy (with phone number or street name): [id number] - [place], [address] [place]  Agent: Please be advised that RX refills may take up to 3 business days. We ask that you follow-up with your pharmacy.

## 2018-03-09 ENCOUNTER — Other Ambulatory Visit: Payer: Self-pay | Admitting: Family Medicine

## 2018-03-09 DIAGNOSIS — F419 Anxiety disorder, unspecified: Secondary | ICD-10-CM

## 2018-03-09 NOTE — Telephone Encounter (Signed)
Copied from [place] [id number]. Topic: Quick Communication - See Telephone Encounter >> Mar 09, 2018 11:58 AM Conception Chancy, NT wrote: CRM for notification. See Telephone encounter for: 03/09/18.   Patient is needing a refill on ALPRAZolam (XANAX) 0.5 MG tablet . Please advise.   [facility] [address], [place] AT [address] [place] [address] Phone: [phone] Fax: [phone]

## 2018-03-10 MED ORDER — ALPRAZOLAM 0.5 MG PO TABS: 0.5000 mg | ORAL_TABLET | Freq: Two times a day (BID) | ORAL | 0 refills | Status: DC | PRN

## 2018-03-10 NOTE — Telephone Encounter (Signed)
See note

## 2018-03-10 NOTE — Telephone Encounter (Signed)
Prescription has been faxed to the pharmacy.  

## 2018-03-10 NOTE — Telephone Encounter (Signed)
Refill of Xanax  LOV 02/01/18  Dr. Juleen China  LRF 01/05/18  #60  0 refills  [id number] - [place], [address] [place]

## 2018-03-10 NOTE — Telephone Encounter (Signed)
Please advise on refill.

## 2018-03-10 NOTE — Telephone Encounter (Signed)
Okay to call in. 

## 2018-03-14 DIAGNOSIS — E1159 Type 2 diabetes mellitus with other circulatory complications: Secondary | ICD-10-CM | POA: Insufficient documentation

## 2018-03-14 DIAGNOSIS — I10 Essential (primary) hypertension: Secondary | ICD-10-CM

## 2018-03-14 DIAGNOSIS — K760 Fatty (change of) liver, not elsewhere classified: Secondary | ICD-10-CM | POA: Insufficient documentation

## 2018-03-14 DIAGNOSIS — I152 Hypertension secondary to endocrine disorders: Secondary | ICD-10-CM | POA: Insufficient documentation

## 2018-03-14 NOTE — Progress Notes (Signed)
[name] [name] is a 67 y.o. female is here for follow up.  History of Present Illness:   [name], CMA acting as scribe for [name].   HPI: Patient in for follow up. She did have fall yesterday. She did not hit her head or loose consciousness. She did fall on her knees then fell back onto her left hip and shoulder.   Her cough has came back. It is productive with green sputum. Some SOB and LE edema. No CP. No dizziness. Still smoking.   There are no preventive care reminders to display for this patient. Depression screen [facility] 2/9 03/15/2018 12/01/2017 08/25/2017  Decreased Interest 0 0 0  Down, Depressed, Hopeless 0 0 1  PHQ - 2 Score 0 0 1  Altered sleeping 0 0 1  Tired, decreased energy 0 0 0  Change in appetite 0 0 1  Feeling bad or failure about yourself  0 0 0  Trouble concentrating 0 0 0  Moving slowly or fidgety/restless 0 0 0  Suicidal thoughts 0 0 0  PHQ-9 Score 0 0 3  Difficult doing work/chores Not difficult at all - Somewhat difficult   PMHx, SurgHx, SocialHx, FamHx, Medications, and Allergies were reviewed in the Visit Navigator and updated as appropriate.   Patient Active Problem List   Diagnosis Date Noted  . Balance problem, uses cane 03/17/2018  . Hypertension associated with diabetes ([place]) 03/14/2018  . Hepatic steatosis 03/14/2018  . Polycythemia, secondary 10/15/2017  . Anxiety 05/27/2017  . Mild obstructive sleep apnea-hypopnea syndrome, not on CPAP 01/28/2017  . Diabetes mellitus without complication ([place])   . Coronary artery disease   . Bronchitis, asthmatic   . MI, old   . Hypothyroidism (acquired)   . COPD GOLD 0/ prob AB still smoking  11/07/2016  . Goiter 10/31/2016  . S/P coronary artery stent placement 04/16/2016  . Hypertensive heart disease 04/16/2016  . Hyperlipidemia associated with type 2 diabetes mellitus ([place]) 04/16/2016  . Nicotine dependence, cigarrettes, precontemplative 04/16/2016  . Morbid (severe) obesity due to  excess calories ([place]) 04/16/2016   Social History   Tobacco Use  . Smoking status: Current Every Day Smoker    Packs/day: 0.50    Years: 31.00    Pack years: 15.50  . Smokeless tobacco: Never Used  Substance Use Topics  . Alcohol use: No  . Drug use: No   Current Medications and Allergies:   Current Outpatient Medications:  .  albuterol (ACCUNEB) 1.25 MG/3ML nebulizer solution, Take 1 ampule by nebulization 3 (three) times daily as needed for wheezing., Disp: , Rfl:  .  albuterol (PROAIR HFA) 108 (90 Base) MCG/ACT inhaler, Inhale 2 puffs into the lungs every 6 (six) hours as needed for wheezing or shortness of breath., Disp: , Rfl:  .  ALPRAZolam (XANAX) 0.5 MG tablet, Take 1 tablet (0.5 mg total) by mouth 2 (two) times daily as needed for anxiety., Disp: 60 tablet, Rfl: 0 .  aspirin 81 MG tablet, Take 81 mg by mouth daily., Disp: , Rfl:  .  atorvastatin (LIPITOR) 10 MG tablet, TAKE 1 TABLET(10 MG) BY MOUTH DAILY, Disp: 90 tablet, Rfl: 2 .  budesonide-formoterol (SYMBICORT) 80-4.5 MCG/ACT inhaler, Inhale 2 puffs into the lungs 2 (two) times daily., Disp: 1 Inhaler, Rfl: 11 .  celecoxib (CELEBREX) 200 MG capsule, Take 1 capsule (200 mg total) by mouth daily., Disp: 30 capsule, Rfl: 2 .  clopidogrel (PLAVIX) 75 MG tablet, Take 1 tablet (75 mg total) by  mouth daily., Disp: 90 tablet, Rfl: 2 .  gabapentin (NEURONTIN) 300 MG capsule, Take 1 capsule (300 mg total) by mouth 3 (three) times daily., Disp: 90 capsule, Rfl: 1 .  ipratropium (ATROVENT) 0.06 % nasal spray, USE 2 SPRAYS IN EACH NOSTRIL FOUR TIMES DAILY, Disp: 135 mL, Rfl: 0 .  isosorbide mononitrate (IMDUR) 60 MG 24 hr tablet, Take 1 tablet (60 mg total) by mouth daily., Disp: 90 tablet, Rfl: 2 .  levothyroxine (SYNTHROID, LEVOTHROID) 112 MCG tablet, Take 1 tablet (112 mcg total) by mouth daily., Disp: 90 tablet, Rfl: 3 .  metFORMIN (GLUCOPHAGE) 500 MG tablet, Take 1 tablet (500 mg total) by mouth daily., Disp: 90 tablet, Rfl: 2 .   metoprolol succinate (TOPROL-XL) 50 MG 24 hr tablet, TAKE 1 1/2 TABLETS BY MOUTH EVERY DAY, Disp: 90 tablet, Rfl: 2 .  furosemide (LASIX) 20 MG tablet, Take 1 tablet (20 mg total) by mouth daily., Disp: 30 tablet, Rfl: 3 .  spironolactone (ALDACTONE) 25 MG tablet, TAKE 1 TABLET(25 MG) BY MOUTH DAILY, Disp: 90 tablet, Rfl: 0   Allergies  Allergen Reactions  . Clindamycin/Lincomycin Other (See Comments)    Head ache   . Codeine Hives  . Dilaudid [Hydromorphone Hcl] Nausea And Vomiting  . Iodides   . Reglan [Metoclopramide] Other (See Comments)    Other reaction(s): Dystonia Hallucinations/Violent  . Reglan [Metoclopramide]   . Tramadol   . Dilaudid [Hydromorphone Hcl] Nausea And Vomiting  . Tape Rash    Blisters    Review of Systems   Pertinent items are noted in the HPI. Otherwise, ROS is negative.  Vitals:   Vitals:   03/15/18 1105  BP: 118/68  Pulse: 73  Temp: 98 F (36.7 C)  TempSrc: Oral  SpO2: 90%  Weight: (!) 320 lb 9.6 oz (145.4 kg)  Height: 5\' 5"  (1.651 m)     Body mass index is 53.35 kg/m.  Physical Exam:   Physical Exam  Constitutional: She appears well-nourished.  HENT:  Head: Normocephalic and atraumatic.  Eyes: Pupils are equal, round, and reactive to light. EOM are normal.  Neck: Normal range of motion. Neck supple.  Cardiovascular: Normal rate, regular rhythm, normal heart sounds and intact distal pulses.  Pulmonary/Chest: Effort normal. She has wheezes in the left upper field.  Abdominal: Soft.  Musculoskeletal: She exhibits edema.       Left shoulder: She exhibits bony tenderness. She exhibits normal range of motion.       Left hip: She exhibits bony tenderness. She exhibits normal range of motion.       Right knee: She exhibits normal range of motion and no effusion. Tenderness found. Medial joint line and lateral joint line tenderness noted.  Skin: Skin is warm.  Psychiatric: She has a normal mood and affect. Her behavior is normal.  Nursing  note and vitals reviewed.   EXAM: CHEST - 2 VIEW  COMPARISON: Chest x-ray dated December 09, 2016.  FINDINGS: The heart is at the upper limits of normal in size. Normal pulmonary vascularity. Mildly coarsened interstitial markings are similar to prior study and likely smoking-related. No focal consolidation, pleural effusion, or pneumothorax. No acute osseous abnormality.  IMPRESSION: No active cardiopulmonary disease.  Results for orders placed or performed in visit on 03/15/18  CBC with Differential/Platelet  Result Value Ref Range   WBC 8.1 4.0 - 10.5 K/uL   RBC 5.60 (H) 3.87 - 5.11 Mil/uL   Hemoglobin 17.6 (H) 12.0 - 15.0 g/dL   HCT 54.3  Repeated and verified X2. (H) 36.0 - 46.0 %   MCV 96.9 78.0 - 100.0 fl   MCHC 32.5 30.0 - 36.0 g/dL   RDW 16.5 (H) 11.5 - 15.5 %   Platelets 179.0 150.0 - 400.0 K/uL   Neutrophils Relative % 63.0 43.0 - 77.0 %   Lymphocytes Relative 27.1 12.0 - 46.0 %   Monocytes Relative 7.7 3.0 - 12.0 %   Eosinophils Relative 1.6 0.0 - 5.0 %   Basophils Relative 0.6 0.0 - 3.0 %   Neutro Abs 5.1 1.4 - 7.7 K/uL   Lymphs Abs 2.2 0.7 - 4.0 K/uL   Monocytes Absolute 0.6 0.1 - 1.0 K/uL   Eosinophils Absolute 0.1 0.0 - 0.7 K/uL   Basophils Absolute 0.0 0.0 - 0.1 K/uL  Comprehensive metabolic panel  Result Value Ref Range   Sodium 138 135 - 145 mEq/L   Potassium 4.4 3.5 - 5.1 mEq/L   Chloride 100 96 - 112 mEq/L   CO2 30 19 - 32 mEq/L   Glucose, Bld 165 (H) 70 - 99 mg/dL   BUN 16 6 - 23 mg/dL   Creatinine, Ser 0.94 0.40 - 1.20 mg/dL   Total Bilirubin 0.8 0.2 - 1.2 mg/dL   Alkaline Phosphatase 87 39 - 117 U/L   AST 15 0 - 37 U/L   ALT 16 0 - 35 U/L   Total Protein 7.4 6.0 - 8.3 g/dL   Albumin 4.0 3.5 - 5.2 g/dL   Calcium 9.8 8.4 - 10.5 mg/dL   GFR 63.57 >60.00 mL/min  Hemoglobin A1c  Result Value Ref Range   Hgb A1c MFr Bld 8.0 (H) 4.6 - 6.5 %  Microalbumin / creatinine urine ratio  Result Value Ref Range   Microalb, Ur 1.0 0.0 - 1.9 mg/dL    Creatinine,U 87.5 mg/dL   Microalb Creat Ratio 1.1 0.0 - 30.0 mg/g  TSH  Result Value Ref Range   TSH 0.62 0.35 - 4.50 uIU/mL  T4, free  Result Value Ref Range   Free T4 1.14 0.60 - 1.60 ng/dL    Assessment and Plan:   Jalisha was seen today for follow-up.  Diagnoses and all orders for this visit:  Diabetes mellitus without complication ([place]) -     Comprehensive metabolic panel -     Hemoglobin A1c -     Microalbumin / creatinine urine ratio  Hypertension associated with diabetes (HCC)  Hepatic steatosis  Polycythemia, secondary Comments: May need phlebotomy. To Heme.  Orders: -     CBC with Differential/Platelet  Cigarette nicotine dependence without complication Comments: Precontemplative but making small changes. The patient was counseled on the dangers of tobacco use, and was advised to quit.  Reviewed strategies to maximize success, including removing cigarettes and smoking materials from environment, stress management, support of family/friends, written materials, local smoking cessation programs (1-800-QUIT-NOW and SMOKEFREE.GOV) and pharmacotherapy. Greater than 3 minutes were spent on counseling today.  SOB (shortness of breath) Comments: Smoker with evidence of hyoxic state. Also with evidence of heart failure. CXR today. Will treat with diuretic. Discussed the importance of smoking cessation.  Orders: -     DG Chest 2 View  Hypothyroidism (acquired) -     TSH -     T4, free  Bilateral lower extremity edema -     furosemide (LASIX) 20 MG tablet; Take 1 tablet (20 mg total) by mouth daily.  Contusion of right knee, initial encounter Comments: No red flags. Ice, rest, stretches reviewed.   Contusion  of left hip, initial encounter Comments: No red flags. Ice, rest, stretches reviewed.   Contusion of left shoulder, initial encounter Comments: No red flags. Ice, rest, stretches reviewed.   Balance problem, uses cane Comments: Offered PT.     Marland Kitchen Reviewed expectations re: course of current medical issues. . Discussed self-management of symptoms. . Outlined signs and symptoms indicating need for more acute intervention. . Patient verbalized understanding and all questions were answered. Marland Kitchen Health Maintenance issues including appropriate healthy diet, exercise, and smoking avoidance were discussed with patient. . See orders for this visit as documented in the electronic medical record. . Patient received an After Visit Summary.  Briscoe Deutscher, DO [place], [place] 03/21/2018  Future Appointments  Date Time Provider [place]  04/20/2018 11:00 AM Briscoe Deutscher, DO [facility]   CMA served as scribe during this visit. History, Physical, and Plan performed by medical provider. The above documentation has been reviewed and is accurate and complete. Briscoe Deutscher, D.O.  Records requested if needed. Time spent with the patient: 45 minutes, of which >50% was spent in obtaining information about her symptoms, reviewing her previous labs, evaluations, and treatments, counseling her about her condition (please see the discussed topics above), and developing a plan to further investigate it; she had a number of questions which I addressed.

## 2018-03-15 ENCOUNTER — Ambulatory Visit (INDEPENDENT_AMBULATORY_CARE_PROVIDER_SITE_OTHER): Payer: Medicare HMO | Admitting: Family Medicine

## 2018-03-15 ENCOUNTER — Ambulatory Visit (INDEPENDENT_AMBULATORY_CARE_PROVIDER_SITE_OTHER): Payer: Medicare HMO

## 2018-03-15 ENCOUNTER — Other Ambulatory Visit: Payer: Self-pay | Admitting: Family Medicine

## 2018-03-15 ENCOUNTER — Encounter: Payer: Self-pay | Admitting: Family Medicine

## 2018-03-15 VITALS — BP 118/68 | HR 73 | Temp 98.0°F | Ht 65.0 in | Wt 320.6 lb

## 2018-03-15 DIAGNOSIS — S7002XA Contusion of left hip, initial encounter: Secondary | ICD-10-CM

## 2018-03-15 DIAGNOSIS — R0602 Shortness of breath: Secondary | ICD-10-CM

## 2018-03-15 DIAGNOSIS — S8001XA Contusion of right knee, initial encounter: Secondary | ICD-10-CM

## 2018-03-15 DIAGNOSIS — E119 Type 2 diabetes mellitus without complications: Secondary | ICD-10-CM

## 2018-03-15 DIAGNOSIS — R2689 Other abnormalities of gait and mobility: Secondary | ICD-10-CM | POA: Diagnosis not present

## 2018-03-15 DIAGNOSIS — F17210 Nicotine dependence, cigarettes, uncomplicated: Secondary | ICD-10-CM | POA: Diagnosis not present

## 2018-03-15 DIAGNOSIS — E039 Hypothyroidism, unspecified: Secondary | ICD-10-CM

## 2018-03-15 DIAGNOSIS — E1159 Type 2 diabetes mellitus with other circulatory complications: Secondary | ICD-10-CM | POA: Diagnosis not present

## 2018-03-15 DIAGNOSIS — D751 Secondary polycythemia: Secondary | ICD-10-CM | POA: Diagnosis not present

## 2018-03-15 DIAGNOSIS — R600 Localized edema: Secondary | ICD-10-CM | POA: Diagnosis not present

## 2018-03-15 DIAGNOSIS — S40012A Contusion of left shoulder, initial encounter: Secondary | ICD-10-CM

## 2018-03-15 DIAGNOSIS — K760 Fatty (change of) liver, not elsewhere classified: Secondary | ICD-10-CM | POA: Diagnosis not present

## 2018-03-15 DIAGNOSIS — I152 Hypertension secondary to endocrine disorders: Secondary | ICD-10-CM

## 2018-03-15 DIAGNOSIS — I10 Essential (primary) hypertension: Secondary | ICD-10-CM

## 2018-03-15 LAB — CBC WITH DIFFERENTIAL/PLATELET
Basophils Absolute: 0 10*3/uL (ref 0.0–0.1)
Basophils Relative: 0.6 % (ref 0.0–3.0)
Eosinophils Absolute: 0.1 10*3/uL (ref 0.0–0.7)
Eosinophils Relative: 1.6 % (ref 0.0–5.0)
HCT: 54.3 % — ABNORMAL HIGH (ref 36.0–46.0)
Hemoglobin: 17.6 g/dL — ABNORMAL HIGH (ref 12.0–15.0)
Lymphocytes Relative: 27.1 % (ref 12.0–46.0)
Lymphs Abs: 2.2 10*3/uL (ref 0.7–4.0)
MCHC: 32.5 g/dL (ref 30.0–36.0)
MCV: 96.9 fl (ref 78.0–100.0)
Monocytes Absolute: 0.6 10*3/uL (ref 0.1–1.0)
Monocytes Relative: 7.7 % (ref 3.0–12.0)
Neutro Abs: 5.1 10*3/uL (ref 1.4–7.7)
Neutrophils Relative %: 63 % (ref 43.0–77.0)
Platelets: 179 10*3/uL (ref 150.0–400.0)
RBC: 5.6 Mil/uL — ABNORMAL HIGH (ref 3.87–5.11)
RDW: 16.5 % — ABNORMAL HIGH (ref 11.5–15.5)
WBC: 8.1 10*3/uL (ref 4.0–10.5)

## 2018-03-15 LAB — COMPREHENSIVE METABOLIC PANEL
ALT: 16 U/L (ref 0–35)
AST: 15 U/L (ref 0–37)
Albumin: 4 g/dL (ref 3.5–5.2)
Alkaline Phosphatase: 87 U/L (ref 39–117)
BUN: 16 mg/dL (ref 6–23)
CO2: 30 mEq/L (ref 19–32)
Calcium: 9.8 mg/dL (ref 8.4–10.5)
Chloride: 100 mEq/L (ref 96–112)
Creatinine, Ser: 0.94 mg/dL (ref 0.40–1.20)
GFR: 63.57 mL/min (ref >60.00)
Glucose, Bld: 165 mg/dL — ABNORMAL HIGH (ref 70–99)
Potassium: 4.4 mEq/L (ref 3.5–5.1)
Sodium: 138 mEq/L (ref 135–145)
Total Bilirubin: 0.8 mg/dL (ref 0.2–1.2)
Total Protein: 7.4 g/dL (ref 6.0–8.3)

## 2018-03-15 LAB — T4, FREE: Free T4: 1.14 ng/dL (ref 0.60–1.60)

## 2018-03-15 LAB — MICROALBUMIN / CREATININE URINE RATIO
Creatinine,U: 87.5 mg/dL
Microalb Creat Ratio: 1.1 mg/g (ref 0.0–30.0)
Microalb, Ur: 1 mg/dL (ref 0.0–1.9)

## 2018-03-15 LAB — TSH: TSH: 0.62 u[IU]/mL (ref 0.35–4.50)

## 2018-03-15 LAB — HEMOGLOBIN A1C: Hgb A1c MFr Bld: 8 % — ABNORMAL HIGH (ref 4.6–6.5)

## 2018-03-15 MED ORDER — SPIRONOLACTONE 25 MG PO TABS: 25.0000 mg | ORAL_TABLET | Freq: Every day | ORAL | 0 refills | Status: DC

## 2018-03-15 MED ORDER — FUROSEMIDE 20 MG PO TABS: 20.0000 mg | ORAL_TABLET | Freq: Every day | ORAL | 3 refills | Status: DC

## 2018-03-15 NOTE — Patient Instructions (Addendum)
HOLD INVOKANA IF TAKING. HOLD LASIX 40 AND POTASSIUM.  TAKE THE LASIX 20 WITH SPIRONOLACTONE DAILY. I WANT TO SEE YOU AGAIN IN 2-3 WEEKS.

## 2018-03-17 DIAGNOSIS — R2689 Other abnormalities of gait and mobility: Secondary | ICD-10-CM | POA: Insufficient documentation

## 2018-03-19 ENCOUNTER — Telehealth: Payer: Self-pay | Admitting: Family Medicine

## 2018-03-19 NOTE — Telephone Encounter (Signed)
See note

## 2018-03-19 NOTE — Telephone Encounter (Signed)
Copied from [place] [id number]. Topic: Quick Communication - See Telephone Encounter >> Mar 19, 2018  9:21 AM Percell Belt A wrote: CRM for notification. See Telephone encounter for: 03/19/18.  Pt called in and stated that she left a message yesterday with question about her sugar.  I did not see anything?  She would like cma to call when she gets a chance.  She stated that her sugar was elevated yesterday and wanted to speak with cma   Best number -[phone]

## 2018-03-19 NOTE — Telephone Encounter (Signed)
Called patient reviewed with Dr. Juleen China pt informed to start back on medications check blood sugars over the weekend and call Monday with readings.

## 2018-03-22 NOTE — Telephone Encounter (Signed)
Copied from [place] [id number]. Topic: Quick Communication - See Telephone Encounter >> Mar 18, 2018 12:15 PM Hewitt Shorts wrote: Pt saw Dr. Juleen China on 03/15/18 and she told pt to hold off on her invokana but her sugar is now in the 200s and she would ike to know when she can return to taking  This medication and bring her levels back done-she states she is sticking to her diet and not eating

## 2018-03-22 NOTE — Telephone Encounter (Signed)
Pt advised by Joellen.

## 2018-03-24 ENCOUNTER — Other Ambulatory Visit: Payer: Self-pay | Admitting: Internal Medicine

## 2018-03-26 ENCOUNTER — Other Ambulatory Visit: Payer: Self-pay | Admitting: Family Medicine

## 2018-04-15 NOTE — Progress Notes (Signed)
[name] [name] is a 67 y.o. female is here for follow up.  History of Present Illness:   [name] [name], CMA acting as scribe for [name].   HPI: Patient in for follow up. She is worried because her blood sugars had been averaging between 197 to 279 with a high of 307. She is down 9 pounds from last visit through [facility].   Cough, intermittent, dry but worse at night. Not using CPAP though noted significant desaturations on sleep study. Still smoking. Hgb recheck due today.  Review of Systems  Constitutional: Negative for chills, fever, malaise/fatigue and weight loss.  Respiratory: Positive for cough. Negative for shortness of breath and wheezing.   Cardiovascular: Negative for chest pain, palpitations and leg swelling.  Gastrointestinal: Negative for abdominal pain, constipation, diarrhea, nausea and vomiting.  Genitourinary: Positive for urgency. Negative for dysuria.  Musculoskeletal: Negative for joint pain and myalgias.  Skin: Negative for rash.  Neurological: Negative for dizziness and headaches.  Psychiatric/Behavioral: Negative for depression, substance abuse and suicidal ideas. The patient is not nervous/anxious.    There are no preventive care reminders to display for this patient.   Depression screen [facility] 2/9 03/15/2018 12/01/2017 08/25/2017  Decreased Interest 0 0 0  Down, Depressed, Hopeless 0 0 1  PHQ - 2 Score 0 0 1  Altered sleeping 0 0 1  Tired, decreased energy 0 0 0  Change in appetite 0 0 1  Feeling bad or failure about yourself  0 0 0  Trouble concentrating 0 0 0  Moving slowly or fidgety/restless 0 0 0  Suicidal thoughts 0 0 0  PHQ-9 Score 0 0 3  Difficult doing work/chores Not difficult at all - Somewhat difficult   PMHx, SurgHx, SocialHx, FamHx, Medications, and Allergies were reviewed in the Visit Navigator and updated as appropriate.   Patient Active Problem List   Diagnosis Date Noted  . Diabetic peripheral neuropathy associated with  type 2 diabetes mellitus ([place]) 04/18/2018  . Gout, intermittent, prn colchicine 04/18/2018  . Balance problem, uses cane 03/17/2018  . Hypertension associated with diabetes ([place]) 03/14/2018  . Hepatic steatosis 03/14/2018  . Polycythemia, secondary, followed by Heme, therapeutic phlebotomy 10/15/2017  . Anxiety, uses prn Xanax 05/27/2017  . Mild obstructive sleep apnea-hypopnea syndrome but with sig desats, not on CPAP 01/28/2017  . DM (diabetes mellitus) with complications ([place]), on Metformin and Invokana   . CAD, BMS to LAD 02/2008   . Bronchitis, asthmatic   . MI, old   . Hypothyroidism (acquired), stable on Levothyroxine   . COPD GOLD 0/ prob AB still smoking  11/07/2016  . S/P coronary artery stent placement 04/16/2016  . Hypertensive heart disease 04/16/2016  . Hyperlipidemia associated with type 2 diabetes mellitus ([place]), on Lipitor 04/16/2016  . Nicotine dependence, cigarrettes, precontemplative 04/16/2016  . Morbid (severe) obesity due to excess calories ([place]) 04/16/2016   Social History   Tobacco Use  . Smoking status: Current Every Day Smoker    Packs/day: 0.50    Years: 31.00    Pack years: 15.50  . Smokeless tobacco: Never Used  Substance Use Topics  . Alcohol use: No  . Drug use: No   Current Medications and Allergies:   .  albuterol (ACCUNEB) 1.25 MG/3ML nebulizer solution, Take 1 ampule by nebulization 3 (three) times daily as needed for wheezing., Disp: , Rfl:  .  albuterol (PROAIR HFA) 108 (90 Base) MCG/ACT inhaler, Inhale 2 puffs into the lungs every 6 (six) hours as  needed for wheezing or shortness of breath., Disp: , Rfl:  .  ALPRAZolam (XANAX) 0.5 MG tablet, Take 1 tablet (0.5 mg total) by mouth 2 (two) times daily as needed for anxiety., Disp: 60 tablet, Rfl: 0 .  aspirin 81 MG tablet, Take 81 mg by mouth daily., Disp: , Rfl:  .  atorvastatin (LIPITOR) 10 MG tablet, TAKE 1 TABLET(10 MG) BY MOUTH DAILY, Disp: 90 tablet, Rfl: 2 .  celecoxib (CELEBREX) 200 MG  capsule, Take 1 capsule (200 mg total) by mouth daily., Disp: 30 capsule, Rfl: 2 .  clopidogrel (PLAVIX) 75 MG tablet, Take 1 tablet (75 mg total) by mouth daily., Disp: 90 tablet, Rfl: 2 .  furosemide (LASIX) 20 MG tablet, Take 1 tablet (20 mg total) by mouth daily., Disp: 30 tablet, Rfl: 3 .  gabapentin (NEURONTIN) 300 MG capsule, Take 1 capsule (300 mg total) by mouth 3 (three) times daily., Disp: 90 capsule, Rfl: 1 .  ipratropium (ATROVENT) 0.06 % nasal spray, USE 2 SPRAYS IN EACH NOSTRIL FOUR TIMES DAILY, Disp: 135 mL, Rfl: 0 .  isosorbide mononitrate (IMDUR) 60 MG 24 hr tablet, Take 1 tablet (60 mg total) by mouth daily., Disp: 90 tablet, Rfl: 2 .  levothyroxine (SYNTHROID, LEVOTHROID) 112 MCG tablet, Take 1 tablet (112 mcg total) by mouth daily., Disp: 90 tablet, Rfl: 3 .  metFORMIN (GLUCOPHAGE) 500 MG tablet, Take 1 tablet (500 mg total) by mouth daily., Disp: 90 tablet, Rfl: 2 .  metoprolol succinate (TOPROL-XL) 50 MG 24 hr tablet, TAKE 1 1/2 TABLETS BY MOUTH EVERY DAY, Disp: 90 tablet, Rfl: 2 .  spironolactone (ALDACTONE) 25 MG tablet, TAKE 1 TABLET(25 MG) BY MOUTH DAILY, Disp: 90 tablet, Rfl: 0 .  SYMBICORT 80-4.5 MCG/ACT inhaler, INHALE 2 PUFFS BY MOUTH TWICE DAILY, Disp: 10.2 g, Rfl: 0   Allergies  Allergen Reactions  . Clindamycin/Lincomycin Other (See Comments)    Head ache   . Codeine Hives  . Dilaudid [Hydromorphone Hcl] Nausea And Vomiting  . Iodides   . Reglan [Metoclopramide] Other (See Comments)    Other reaction(s): Dystonia Hallucinations/Violent  . Reglan [Metoclopramide]   . Tramadol   . Dilaudid [Hydromorphone Hcl] Nausea And Vomiting  . Tape Rash    Blisters    Review of Systems   Pertinent items are noted in the HPI. Otherwise, ROS is negative.  Vitals:   Vitals:   04/16/18 0949  BP: 124/70  Pulse: 82  Temp: 98.5 F (36.9 C)  TempSrc: Oral  SpO2: 94%  Weight: (!) 311 lb 9.6 oz (141.3 kg)  Height: _0  (1.651 m)     Body mass index is 51.85  kg/m.  Physical Exam:   Physical Exam  Constitutional: She appears well-nourished.  HENT:  Head: Normocephalic and atraumatic.  Eyes: Pupils are equal, round, and reactive to light. EOM are normal.  Neck: Normal range of motion. Neck supple.  Cardiovascular: Normal rate, regular rhythm, normal heart sounds and intact distal pulses.  Pulmonary/Chest: Effort normal. She has wheezes. She has rhonchi.  Abdominal: Soft.  Musculoskeletal: She exhibits edema.  Skin: Skin is warm.  Psychiatric: She has a normal mood and affect. Her behavior is normal.  Nursing note and vitals reviewed.   Assessment and Plan:   [name] [name] was seen today for follow-up.  Diagnoses and all orders for this visit:  Hypertension associated with diabetes ([place])  Mild obstructive sleep apnea-hypopnea syndrome, not on CPAP  Hyperlipidemia associated with type 2 diabetes mellitus ([place])  Hypothyroidism (acquired)  Anxiety  Balance problem, uses cane  Cigarette nicotine dependence without complication Comments: Precontemplative.  Type 2 diabetes mellitus with hyperglycemia, without long-term current use of insulin (HCC) -     CBC -     Comp Met (CMET) -     INVOKANA 300 MG TABS tablet; TAKE 1 TABLET(300 MG) BY MOUTH DAILY  Bilateral lower extremity edema -     furosemide (LASIX) 20 MG tablet; Take 1 tablet (20 mg total) by mouth daily. -     spironolactone (ALDACTONE) 25 MG tablet; TAKE 1 TABLET(25 MG) BY MOUTH DAILY  Polyuria -     Urine Culture -     Urinalysis, Routine w reflex microscopic  Acute idiopathic gout of foot, unspecified laterality -     colchicine 0.6 MG tablet; Take 1 tablet (0.6 mg total) by mouth daily.  Cough -     doxycycline (VIBRA-TABS) 100 MG tablet; Take 1 tablet (100 mg total) by mouth 2 (two) times daily.  Coronary artery disease of bypass graft of native heart with stable angina pectoris (HCC)  Morbid (severe) obesity due to excess calories ([place])  Diabetic peripheral  neuropathy associated with type 2 diabetes mellitus ([place])  Gout, unspecified cause, unspecified chronicity, unspecified site    . Reviewed expectations re: course of current medical issues. . Discussed self-management of symptoms. . Outlined signs and symptoms indicating need for more acute intervention. . Patient verbalized understanding and all questions were answered. Marland Kitchen Health Maintenance issues including appropriate healthy diet, exercise, and smoking avoidance were discussed with patient. . See orders for this visit as documented in the electronic medical record. . Patient received an After Visit Summary.  Briscoe Deutscher, DO [place], [place] 04/18/2018  Future Appointments  Date Time Provider [place]  07/19/2018 11:00 AM Briscoe Deutscher, DO [facility]   CMA served as scribe during this visit. History, Physical, and Plan performed by medical provider. The above documentation has been reviewed and is accurate and complete. Briscoe Deutscher, D.O.

## 2018-04-16 ENCOUNTER — Ambulatory Visit (INDEPENDENT_AMBULATORY_CARE_PROVIDER_SITE_OTHER): Payer: Medicare HMO | Admitting: Family Medicine

## 2018-04-16 VITALS — BP 124/70 | HR 82 | Temp 98.5°F | Ht 65.0 in | Wt 311.6 lb

## 2018-04-16 DIAGNOSIS — E1169 Type 2 diabetes mellitus with other specified complication: Secondary | ICD-10-CM

## 2018-04-16 DIAGNOSIS — R600 Localized edema: Secondary | ICD-10-CM | POA: Diagnosis not present

## 2018-04-16 DIAGNOSIS — R2689 Other abnormalities of gait and mobility: Secondary | ICD-10-CM | POA: Diagnosis not present

## 2018-04-16 DIAGNOSIS — I152 Hypertension secondary to endocrine disorders: Secondary | ICD-10-CM

## 2018-04-16 DIAGNOSIS — E039 Hypothyroidism, unspecified: Secondary | ICD-10-CM | POA: Diagnosis not present

## 2018-04-16 DIAGNOSIS — E1165 Type 2 diabetes mellitus with hyperglycemia: Secondary | ICD-10-CM

## 2018-04-16 DIAGNOSIS — I10 Essential (primary) hypertension: Secondary | ICD-10-CM | POA: Diagnosis not present

## 2018-04-16 DIAGNOSIS — R358 Other polyuria: Secondary | ICD-10-CM | POA: Diagnosis not present

## 2018-04-16 DIAGNOSIS — R3589 Other polyuria: Secondary | ICD-10-CM

## 2018-04-16 DIAGNOSIS — E1159 Type 2 diabetes mellitus with other circulatory complications: Secondary | ICD-10-CM

## 2018-04-16 DIAGNOSIS — E1142 Type 2 diabetes mellitus with diabetic polyneuropathy: Secondary | ICD-10-CM

## 2018-04-16 DIAGNOSIS — R05 Cough: Secondary | ICD-10-CM

## 2018-04-16 DIAGNOSIS — I25708 Atherosclerosis of coronary artery bypass graft(s), unspecified, with other forms of angina pectoris: Secondary | ICD-10-CM

## 2018-04-16 DIAGNOSIS — G4733 Obstructive sleep apnea (adult) (pediatric): Secondary | ICD-10-CM

## 2018-04-16 DIAGNOSIS — F17210 Nicotine dependence, cigarettes, uncomplicated: Secondary | ICD-10-CM

## 2018-04-16 DIAGNOSIS — M109 Gout, unspecified: Secondary | ICD-10-CM

## 2018-04-16 DIAGNOSIS — F419 Anxiety disorder, unspecified: Secondary | ICD-10-CM | POA: Diagnosis not present

## 2018-04-16 DIAGNOSIS — R059 Cough, unspecified: Secondary | ICD-10-CM

## 2018-04-16 DIAGNOSIS — E785 Hyperlipidemia, unspecified: Secondary | ICD-10-CM

## 2018-04-16 DIAGNOSIS — M10079 Idiopathic gout, unspecified ankle and foot: Secondary | ICD-10-CM

## 2018-04-16 LAB — COMPREHENSIVE METABOLIC PANEL
ALT: 18 U/L (ref 0–35)
AST: 18 U/L (ref 0–37)
Albumin: 4 g/dL (ref 3.5–5.2)
Alkaline Phosphatase: 88 U/L (ref 39–117)
BUN: 19 mg/dL (ref 6–23)
CO2: 37 mEq/L — ABNORMAL HIGH (ref 19–32)
Calcium: 9.7 mg/dL (ref 8.4–10.5)
Chloride: 97 mEq/L (ref 96–112)
Creatinine, Ser: 1.13 mg/dL (ref 0.40–1.20)
GFR: 51.39 mL/min — ABNORMAL LOW (ref >60.00)
Glucose, Bld: 297 mg/dL — ABNORMAL HIGH (ref 70–99)
Potassium: 4.4 mEq/L (ref 3.5–5.1)
Sodium: 140 mEq/L (ref 135–145)
Total Bilirubin: 1 mg/dL (ref 0.2–1.2)
Total Protein: 7.5 g/dL (ref 6.0–8.3)

## 2018-04-16 LAB — URINALYSIS, ROUTINE W REFLEX MICROSCOPIC
Bilirubin Urine: NEGATIVE
Hgb urine dipstick: NEGATIVE
Ketones, ur: NEGATIVE
Leukocytes, UA: NEGATIVE
Nitrite: NEGATIVE
Specific Gravity, Urine: <=1.005 — AB (ref 1.000–1.030)
Total Protein, Urine: NEGATIVE
Urine Glucose: >=1000 — AB
Urobilinogen, UA: 0.2 (ref 0.0–1.0)
pH: 6 (ref 5.0–8.0)

## 2018-04-16 LAB — CBC
HCT: 54.7 % — ABNORMAL HIGH (ref 36.0–46.0)
Hemoglobin: 17.9 g/dL — ABNORMAL HIGH (ref 12.0–15.0)
MCHC: 32.7 g/dL (ref 30.0–36.0)
MCV: 95.5 fl (ref 78.0–100.0)
Platelets: 152 10*3/uL (ref 150.0–400.0)
RBC: 5.72 Mil/uL — ABNORMAL HIGH (ref 3.87–5.11)
RDW: 16.3 % — ABNORMAL HIGH (ref 11.5–15.5)
WBC: 7 10*3/uL (ref 4.0–10.5)

## 2018-04-16 MED ORDER — FUROSEMIDE 20 MG PO TABS: 20.0000 mg | ORAL_TABLET | Freq: Every day | ORAL | 3 refills | Status: DC

## 2018-04-16 MED ORDER — DOXYCYCLINE HYCLATE 100 MG PO TABS: 100.0000 mg | ORAL_TABLET | Freq: Two times a day (BID) | ORAL | 0 refills | Status: DC

## 2018-04-16 MED ORDER — INVOKANA 300 MG PO TABS: ORAL_TABLET | ORAL | 1 refills | Status: DC

## 2018-04-16 MED ORDER — COLCHICINE 0.6 MG PO TABS: 0.6000 mg | ORAL_TABLET | Freq: Every day | ORAL | 0 refills | Status: DC

## 2018-04-16 MED ORDER — SPIRONOLACTONE 25 MG PO TABS: ORAL_TABLET | ORAL | 0 refills | Status: DC

## 2018-04-16 NOTE — Patient Instructions (Signed)
Call orthopedics for app

## 2018-04-17 ENCOUNTER — Encounter: Payer: Self-pay | Admitting: Family Medicine

## 2018-04-18 DIAGNOSIS — M109 Gout, unspecified: Secondary | ICD-10-CM | POA: Insufficient documentation

## 2018-04-18 DIAGNOSIS — E1142 Type 2 diabetes mellitus with diabetic polyneuropathy: Secondary | ICD-10-CM | POA: Insufficient documentation

## 2018-04-18 LAB — URINE CULTURE
MICRO NUMBER:: 90886984
SPECIMEN QUALITY:: ADEQUATE

## 2018-04-20 ENCOUNTER — Ambulatory Visit: Payer: Medicare HMO | Admitting: Family Medicine

## 2018-04-26 ENCOUNTER — Other Ambulatory Visit: Payer: Self-pay | Admitting: Family Medicine

## 2018-04-26 DIAGNOSIS — F419 Anxiety disorder, unspecified: Secondary | ICD-10-CM

## 2018-04-26 NOTE — Telephone Encounter (Signed)
Please advise on refill. Patient LOV 04/16/18

## 2018-05-13 ENCOUNTER — Other Ambulatory Visit: Payer: Self-pay | Admitting: Family Medicine

## 2018-05-13 DIAGNOSIS — J4541 Moderate persistent asthma with (acute) exacerbation: Secondary | ICD-10-CM

## 2018-05-16 ENCOUNTER — Other Ambulatory Visit: Payer: Self-pay | Admitting: Family Medicine

## 2018-05-16 DIAGNOSIS — E039 Hypothyroidism, unspecified: Secondary | ICD-10-CM

## 2018-05-25 ENCOUNTER — Other Ambulatory Visit: Payer: Self-pay

## 2018-05-25 ENCOUNTER — Telehealth: Payer: Self-pay | Admitting: Surgical

## 2018-05-25 MED ORDER — ALBUTEROL SULFATE HFA 108 (90 BASE) MCG/ACT IN AERS: 2.0000 | INHALATION_SPRAY | Freq: Four times a day (QID) | RESPIRATORY_TRACT | 1 refills | Status: DC | PRN

## 2018-05-25 NOTE — Telephone Encounter (Signed)
I have not but I have called patient to verify that she does need ordered

## 2018-05-25 NOTE — Telephone Encounter (Signed)
Have you seen this form?

## 2018-05-25 NOTE — Telephone Encounter (Signed)
Copied from [place] [id number]. Topic: General - Other >> May 25, 2018  9:54 AM Carolyn Stare wrote:  [employer] call to ask if we received the form that was faxed over for pt. Pt is requesting diabetic supplies from them and they sent over a fax to be completed by the doctor  Dene' [phone]

## 2018-05-26 NOTE — Telephone Encounter (Signed)
Called office let them know that we have not received fax. They will resend to our fax not able to give verbal

## 2018-05-28 NOTE — Telephone Encounter (Signed)
ppw faxed and sent to scan

## 2018-06-03 ENCOUNTER — Telehealth: Payer: Self-pay | Admitting: Family Medicine

## 2018-06-03 ENCOUNTER — Other Ambulatory Visit: Payer: Self-pay | Admitting: Family Medicine

## 2018-06-03 NOTE — Telephone Encounter (Signed)
See note

## 2018-06-03 NOTE — Telephone Encounter (Signed)
Copied from [place] [id number]. Topic: Quick Communication - Rx Refill/Question >> Jun 03, 2018  3:43 PM Lysle Morales L, NT wrote: Medication celecoxib (CELEBREX) 200 MG capsule for a 90 day supply and also ALPRAZolam Duanne Moron) 0.5 MG tablet  Has the patient contacted their pharmacy? yes  (Agent: If no, request that the patient contact the pharmacy for the refill. (Agent: If yes, when and what did the pharmacy advise?  Preferred Pharmacy (with phone number or street name [place], [place] [phone] (Phone) [phone] (Fax)    Agent: Please be advised that RX refills may take up to 3 business days. We ask that you follow-up with your pharmacy.

## 2018-06-04 ENCOUNTER — Other Ambulatory Visit: Payer: Self-pay | Admitting: Family Medicine

## 2018-06-04 DIAGNOSIS — F419 Anxiety disorder, unspecified: Secondary | ICD-10-CM

## 2018-06-04 NOTE — Telephone Encounter (Signed)
Please advise 

## 2018-06-04 NOTE — Telephone Encounter (Signed)
Ok to fill 

## 2018-06-06 NOTE — Telephone Encounter (Signed)
Already addressed

## 2018-06-18 ENCOUNTER — Other Ambulatory Visit: Payer: Self-pay | Admitting: Family Medicine

## 2018-06-18 DIAGNOSIS — J4541 Moderate persistent asthma with (acute) exacerbation: Secondary | ICD-10-CM

## 2018-07-05 ENCOUNTER — Other Ambulatory Visit: Payer: Self-pay | Admitting: Family Medicine

## 2018-07-08 ENCOUNTER — Telehealth: Payer: Self-pay | Admitting: Family Medicine

## 2018-07-08 NOTE — Telephone Encounter (Signed)
Telephone call to verify the correct number  name and number of pharmacy patient wants refill called to.

## 2018-07-08 NOTE — Telephone Encounter (Signed)
[place], [place] [phone] (Phone) [phone] (Fax)   Pt called to confirm pharmacy above for test strips and nebulizer medication.

## 2018-07-08 NOTE — Telephone Encounter (Signed)
See note

## 2018-07-08 NOTE — Telephone Encounter (Signed)
Pt requesting refills of the following medication: Tru Metric Strips -15pack refill (not on current medication list. Albuterol  nebulizer solution (last filled by historical provider). Pt states these medications were previously obtained through [employer] mail order from [place].   Last OV: 04/16/18 PCP: 07/19/18 Pharmacy:[facility]   [place]

## 2018-07-08 NOTE — Telephone Encounter (Signed)
Copied from [place] [id number]. Topic: Quick Communication - Rx Refill/Question >> Jul 08, 2018  2:47 PM Yvette Rack wrote: Medication: Tru Metric Strips  15 pack  Ipratropium Bromide  Albuterol Sulfate she use to get these medicines thru [employer]  mail order  from [place] but now she has [employer] now   Has the patient contacted their pharmacy? No. Medicine never was sent to This pharmacy (Agent: If no, request that the patient contact the pharmacy for the refill.) (Agent: If yes, when and what did the pharmacy advise?)  Preferred Pharmacy (with phone number or street name):   [facility] [place], [place] [phone] (Phone) [phone] (Fax)    Agent: Please be advised that RX refills may take up to 3 business days. We ask that you follow-up with your pharmacy.

## 2018-07-09 MED ORDER — ALBUTEROL SULFATE 1.25 MG/3ML IN NEBU: 1.0000 | INHALATION_SOLUTION | Freq: Three times a day (TID) | RESPIRATORY_TRACT | 2 refills | Status: DC | PRN

## 2018-07-09 MED ORDER — GLUCOSE BLOOD VI STRP: ORAL_STRIP | 12 refills | Status: DC

## 2018-07-09 NOTE — Telephone Encounter (Signed)
OK to fill

## 2018-07-09 NOTE — Telephone Encounter (Signed)
Medications sent in.

## 2018-07-09 NOTE — Telephone Encounter (Signed)
Okay 

## 2018-07-18 NOTE — Progress Notes (Signed)
[name] [name] is a 67 y.o. female is here for follow up.  History of Present Illness:   HPI: See Assessment and Plan section for Problem Based Charting of issues discussed today.   Health Maintenance Due  Topic Date Due  . DEXA SCAN  06/16/2018  . PNA vac Low Risk Adult (1 of 2 - PCV13) 06/16/2018   Depression screen [facility] 2/9 03/15/2018 12/01/2017 08/25/2017  Decreased Interest 0 0 0  Down, Depressed, Hopeless 0 0 1  PHQ - 2 Score 0 0 1  Altered sleeping 0 0 1  Tired, decreased energy 0 0 0  Change in appetite 0 0 1  Feeling bad or failure about yourself  0 0 0  Trouble concentrating 0 0 0  Moving slowly or fidgety/restless 0 0 0  Suicidal thoughts 0 0 0  PHQ-9 Score 0 0 3  Difficult doing work/chores Not difficult at all - Somewhat difficult   PMHx, SurgHx, SocialHx, FamHx, Medications, and Allergies were reviewed in the Visit Navigator and updated as appropriate.   Patient Active Problem List   Diagnosis Date Noted  . Diabetic peripheral neuropathy associated with type 2 diabetes mellitus ([place]) 04/18/2018  . Gout, intermittent, prn colchicine 04/18/2018  . Balance problem, uses cane 03/17/2018  . Hypertension associated with diabetes ([place]) 03/14/2018  . Hepatic steatosis 03/14/2018  . Polycythemia, secondary, followed by Heme, therapeutic phlebotomy 10/15/2017  . Anxiety, uses prn Xanax 05/27/2017  . Mild obstructive sleep apnea-hypopnea syndrome but with sig desats, not on CPAP 01/28/2017  . DM (diabetes mellitus) with complications ([place]), on Metformin and Invokana   . CAD, BMS to LAD 02/2008   . Bronchitis, asthmatic   . MI, old   . Hypothyroidism (acquired), stable on Levothyroxine   . COPD GOLD 0/ prob AB still smoking  11/07/2016  . S/P coronary artery stent placement 04/16/2016  . Hypertensive heart disease 04/16/2016  . Hyperlipidemia associated with type 2 diabetes mellitus ([place]), on Lipitor 04/16/2016  . Nicotine dependence, cigarrettes, precontemplative  04/16/2016  . Morbid (severe) obesity due to excess calories ([place]) 04/16/2016   Social History   Tobacco Use  . Smoking status: Current Every Day Smoker    Packs/day: 0.50    Years: 31.00    Pack years: 15.50  . Smokeless tobacco: Never Used  Substance Use Topics  . Alcohol use: No  . Drug use: No   Current Medications and Allergies:   Current Outpatient Medications:  .  albuterol (ACCUNEB) 1.25 MG/3ML nebulizer solution, Take 3 mLs (1.25 mg total) by nebulization 3 (three) times daily as needed for wheezing., Disp: 75 mL, Rfl: 2 .  albuterol (PROAIR HFA) 108 (90 Base) MCG/ACT inhaler, Inhale 2 puffs into the lungs every 6 (six) hours as needed for wheezing or shortness of breath., Disp: 1 Inhaler, Rfl: 1 .  ALPRAZolam (XANAX) 0.5 MG tablet, TAKE 1 TABLET BY MOUTH TWICE DAILY AS NEEDED FOR ANXIETY, Disp: 60 tablet, Rfl: 0 .  aspirin 81 MG tablet, Take 81 mg by mouth daily., Disp: , Rfl:  .  atorvastatin (LIPITOR) 10 MG tablet, TAKE 1 TABLET(10 MG) BY MOUTH DAILY, Disp: 90 tablet, Rfl: 2 .  celecoxib (CELEBREX) 200 MG capsule, TAKE 1 CAPSULE(200 MG) BY MOUTH DAILY, Disp: 30 capsule, Rfl: 0 .  clopidogrel (PLAVIX) 75 MG tablet, Take 1 tablet (75 mg total) by mouth daily., Disp: 90 tablet, Rfl: 2 .  colchicine 0.6 MG tablet, Take 1 tablet (0.6 mg total) by mouth daily., Disp: 90  tablet, Rfl: 0 .  doxycycline (VIBRA-TABS) 100 MG tablet, Take 1 tablet (100 mg total) by mouth 2 (two) times daily., Disp: 14 tablet, Rfl: 0 .  furosemide (LASIX) 20 MG tablet, Take 1 tablet (20 mg total) by mouth daily., Disp: 30 tablet, Rfl: 3 .  glucose blood test strip, Use as instructed, Disp: 100 each, Rfl: 12 .  INVOKANA 300 MG TABS tablet, TAKE 1 TABLET(300 MG) BY MOUTH DAILY, Disp: 90 tablet, Rfl: 1 .  isosorbide mononitrate (IMDUR) 60 MG 24 hr tablet, Take 1 tablet (60 mg total) by mouth daily., Disp: 90 tablet, Rfl: 2 .  levothyroxine (SYNTHROID, LEVOTHROID) 112 MCG tablet, TAKE 1 TABLET(112 MCG) BY  MOUTH DAILY, Disp: 90 tablet, Rfl: 0 .  metFORMIN (GLUCOPHAGE) 500 MG tablet, TAKE 1 TABLET(500 MG) BY MOUTH DAILY, Disp: 90 tablet, Rfl: 0 .  metoprolol succinate (TOPROL-XL) 50 MG 24 hr tablet, TAKE 1 1/2 TABLETS BY MOUTH EVERY DAY, Disp: 90 tablet, Rfl: 2 .  spironolactone (ALDACTONE) 25 MG tablet, TAKE 1 TABLET(25 MG) BY MOUTH DAILY, Disp: 90 tablet, Rfl: 0 .  clonazePAM (KLONOPIN) 0.5 MG tablet, Take 1 tablet (0.5 mg total) by mouth 2 (two) times daily as needed for anxiety., Disp: 20 tablet, Rfl: 1 .  gabapentin (NEURONTIN) 300 MG capsule, TAKE 1 CAPSULE(300 MG) BY MOUTH THREE TIMES DAILY, Disp: 90 capsule, Rfl: 0 .  ipratropium-albuterol (DUONEB) 0.5-2.5 (3) MG/3ML SOLN, USE 3 ML VIA NEBULIZER EVERY 6 HOURS AS NEEDED, Disp: 3240 mL, Rfl: 1 .  SYMBICORT 80-4.5 MCG/ACT inhaler, INHALE 2 PUFFS BY MOUTH TWICE DAILY, Disp: 10.2 g, Rfl: 3  Allergies  Allergen Reactions  . Clindamycin/Lincomycin Other (See Comments)    Head ache   . Codeine Hives  . Dilaudid [Hydromorphone Hcl] Nausea And Vomiting  . Iodides   . Reglan [Metoclopramide] Other (See Comments)    Other reaction(s): Dystonia Hallucinations/Violent  . Reglan [Metoclopramide]   . Tramadol   . Dilaudid [Hydromorphone Hcl] Nausea And Vomiting  . Tape Rash    Blisters    Review of Systems   Pertinent items are noted in the HPI. Otherwise, ROS is negative.  Vitals:   Vitals:   07/19/18 1116  BP: 130/78  Pulse: 68  Temp: 98.1 F (36.7 C)  TempSrc: Oral  SpO2: 96%  Weight: (!) 303 lb (137.4 kg)  Height: 5\' 5"  (1.651 m)     Body mass index is 50.42 kg/m.  Physical Exam:   Physical Exam  Results for orders placed or performed in visit on 07/19/18  Hemoglobin A1c  Result Value Ref Range   Hgb A1c MFr Bld 8.2 (H) 4.6 - 6.5 %  CBC with Differential/Platelet  Result Value Ref Range   WBC 6.4 4.0 - 10.5 K/uL   RBC 5.60 (H) 3.87 - 5.11 Mil/uL   Hemoglobin 18.0 Repeated and verified X2. (HH) 12.0 - 15.0 g/dL   HCT  54.2 (H) 36.0 - 46.0 %   MCV 96.8 78.0 - 100.0 fl   MCHC 33.3 30.0 - 36.0 g/dL   RDW 16.0 (H) 11.5 - 15.5 %   Platelets 146.0 (L) 150.0 - 400.0 K/uL   Neutrophils Relative % 63.1 43.0 - 77.0 %   Lymphocytes Relative 27.0 12.0 - 46.0 %   Monocytes Relative 7.9 3.0 - 12.0 %   Eosinophils Relative 1.5 0.0 - 5.0 %   Basophils Relative 0.5 0.0 - 3.0 %   Neutro Abs 4.0 1.4 - 7.7 K/uL   Lymphs Abs 1.7 0.7 -  4.0 K/uL   Monocytes Absolute 0.5 0.1 - 1.0 K/uL   Eosinophils Absolute 0.1 0.0 - 0.7 K/uL   Basophils Absolute 0.0 0.0 - 0.1 K/uL  Comprehensive metabolic panel  Result Value Ref Range   Sodium 139 135 - 145 mEq/L   Potassium 4.0 3.5 - 5.1 mEq/L   Chloride 101 96 - 112 mEq/L   CO2 29 19 - 32 mEq/L   Glucose, Bld 166 (H) 70 - 99 mg/dL   BUN 20 6 - 23 mg/dL   Creatinine, Ser 1.23 (H) 0.40 - 1.20 mg/dL   Total Bilirubin 0.9 0.2 - 1.2 mg/dL   Alkaline Phosphatase 76 39 - 117 U/L   AST 13 0 - 37 U/L   ALT 13 0 - 35 U/L   Total Protein 7.2 6.0 - 8.3 g/dL   Albumin 4.0 3.5 - 5.2 g/dL   Calcium 9.7 8.4 - 10.5 mg/dL   GFR 46.56 (L) >60.00 mL/min  TSH  Result Value Ref Range   TSH 0.87 0.35 - 4.50 uIU/mL  Lipid panel  Result Value Ref Range   Cholesterol 112 0 - 200 mg/dL   Triglycerides 151.0 (H) 0.0 - 149.0 mg/dL   HDL 40.90 >39.00 mg/dL   VLDL 30.2 0.0 - 40.0 mg/dL   LDL Cholesterol 41 0 - 99 mg/dL   Total CHOL/HDL Ratio 3    NonHDL 70.74     Assessment and Plan:   No problem-specific Assessment & Plan notes found for this encounter.   Orders Placed This Encounter  Procedures  . Flu vaccine HIGH DOSE PF  . Hemoglobin A1c  . CBC with Differential/Platelet  . Comprehensive metabolic panel  . TSH  . Lipid panel    Meds ordered this encounter  Medications  . clonazePAM (KLONOPIN) 0.5 MG tablet    Sig: Take 1 tablet (0.5 mg total) by mouth 2 (two) times daily as needed for anxiety.    Dispense:  20 tablet    Refill:  1  . DISCONTD: ipratropium-albuterol (DUONEB)  0.5-2.5 (3) MG/3ML SOLN    Sig: Take 3 mLs by nebulization every 6 (six) hours as needed.    Dispense:  360 mL    Refill:  1    . Reviewed expectations re: course of current medical issues. . Discussed self-management of symptoms. . Outlined signs and symptoms indicating need for more acute intervention. . Patient verbalized understanding and all questions were answered. Marland Kitchen Health Maintenance issues including appropriate healthy diet, exercise, and smoking avoidance were discussed with patient. . See orders for this visit as documented in the electronic medical record. . Patient received an After Visit Summary.  Briscoe Deutscher, DO [place], [place] 07/21/2018

## 2018-07-19 ENCOUNTER — Encounter: Payer: Self-pay | Admitting: Family Medicine

## 2018-07-19 ENCOUNTER — Other Ambulatory Visit: Payer: Self-pay | Admitting: Family Medicine

## 2018-07-19 ENCOUNTER — Ambulatory Visit (INDEPENDENT_AMBULATORY_CARE_PROVIDER_SITE_OTHER): Payer: Medicare HMO | Admitting: Family Medicine

## 2018-07-19 ENCOUNTER — Telehealth: Payer: Self-pay

## 2018-07-19 VITALS — BP 130/78 | HR 68 | Temp 98.1°F | Ht 65.0 in | Wt 303.0 lb

## 2018-07-19 DIAGNOSIS — E118 Type 2 diabetes mellitus with unspecified complications: Secondary | ICD-10-CM | POA: Diagnosis not present

## 2018-07-19 DIAGNOSIS — E1169 Type 2 diabetes mellitus with other specified complication: Secondary | ICD-10-CM | POA: Diagnosis not present

## 2018-07-19 DIAGNOSIS — E039 Hypothyroidism, unspecified: Secondary | ICD-10-CM | POA: Diagnosis not present

## 2018-07-19 DIAGNOSIS — J4541 Moderate persistent asthma with (acute) exacerbation: Secondary | ICD-10-CM

## 2018-07-19 DIAGNOSIS — F17210 Nicotine dependence, cigarettes, uncomplicated: Secondary | ICD-10-CM | POA: Diagnosis not present

## 2018-07-19 DIAGNOSIS — D751 Secondary polycythemia: Secondary | ICD-10-CM

## 2018-07-19 DIAGNOSIS — E785 Hyperlipidemia, unspecified: Secondary | ICD-10-CM | POA: Diagnosis not present

## 2018-07-19 DIAGNOSIS — E1159 Type 2 diabetes mellitus with other circulatory complications: Secondary | ICD-10-CM | POA: Diagnosis not present

## 2018-07-19 DIAGNOSIS — E1142 Type 2 diabetes mellitus with diabetic polyneuropathy: Secondary | ICD-10-CM

## 2018-07-19 DIAGNOSIS — I10 Essential (primary) hypertension: Secondary | ICD-10-CM | POA: Diagnosis not present

## 2018-07-19 DIAGNOSIS — I152 Hypertension secondary to endocrine disorders: Secondary | ICD-10-CM

## 2018-07-19 DIAGNOSIS — M25512 Pain in left shoulder: Secondary | ICD-10-CM

## 2018-07-19 DIAGNOSIS — Z23 Encounter for immunization: Secondary | ICD-10-CM | POA: Diagnosis not present

## 2018-07-19 DIAGNOSIS — J449 Chronic obstructive pulmonary disease, unspecified: Secondary | ICD-10-CM

## 2018-07-19 DIAGNOSIS — F419 Anxiety disorder, unspecified: Secondary | ICD-10-CM | POA: Diagnosis not present

## 2018-07-19 DIAGNOSIS — G8929 Other chronic pain: Secondary | ICD-10-CM

## 2018-07-19 LAB — LIPID PANEL
Cholesterol: 112 mg/dL (ref 0–200)
HDL: 40.9 mg/dL (ref >39.00)
LDL Cholesterol: 41 mg/dL (ref 0–99)
NonHDL: 70.74
Total CHOL/HDL Ratio: 3
Triglycerides: 151 mg/dL — ABNORMAL HIGH (ref 0.0–149.0)
VLDL: 30.2 mg/dL (ref 0.0–40.0)

## 2018-07-19 LAB — CBC WITH DIFFERENTIAL/PLATELET
Basophils Absolute: 0 10*3/uL (ref 0.0–0.1)
Basophils Relative: 0.5 % (ref 0.0–3.0)
Eosinophils Absolute: 0.1 10*3/uL (ref 0.0–0.7)
Eosinophils Relative: 1.5 % (ref 0.0–5.0)
HCT: 54.2 % — ABNORMAL HIGH (ref 36.0–46.0)
Hemoglobin: 18 g/dL (ref 12.0–15.0)
Lymphocytes Relative: 27 % (ref 12.0–46.0)
Lymphs Abs: 1.7 10*3/uL (ref 0.7–4.0)
MCHC: 33.3 g/dL (ref 30.0–36.0)
MCV: 96.8 fl (ref 78.0–100.0)
Monocytes Absolute: 0.5 10*3/uL (ref 0.1–1.0)
Monocytes Relative: 7.9 % (ref 3.0–12.0)
Neutro Abs: 4 10*3/uL (ref 1.4–7.7)
Neutrophils Relative %: 63.1 % (ref 43.0–77.0)
Platelets: 146 10*3/uL — ABNORMAL LOW (ref 150.0–400.0)
RBC: 5.6 Mil/uL — ABNORMAL HIGH (ref 3.87–5.11)
RDW: 16 % — ABNORMAL HIGH (ref 11.5–15.5)
WBC: 6.4 10*3/uL (ref 4.0–10.5)

## 2018-07-19 LAB — COMPREHENSIVE METABOLIC PANEL
ALT: 13 U/L (ref 0–35)
AST: 13 U/L (ref 0–37)
Albumin: 4 g/dL (ref 3.5–5.2)
Alkaline Phosphatase: 76 U/L (ref 39–117)
BUN: 20 mg/dL (ref 6–23)
CO2: 29 mEq/L (ref 19–32)
Calcium: 9.7 mg/dL (ref 8.4–10.5)
Chloride: 101 mEq/L (ref 96–112)
Creatinine, Ser: 1.23 mg/dL — ABNORMAL HIGH (ref 0.40–1.20)
GFR: 46.56 mL/min — ABNORMAL LOW (ref >60.00)
Glucose, Bld: 166 mg/dL — ABNORMAL HIGH (ref 70–99)
Potassium: 4 mEq/L (ref 3.5–5.1)
Sodium: 139 mEq/L (ref 135–145)
Total Bilirubin: 0.9 mg/dL (ref 0.2–1.2)
Total Protein: 7.2 g/dL (ref 6.0–8.3)

## 2018-07-19 LAB — TSH: TSH: 0.87 u[IU]/mL (ref 0.35–4.50)

## 2018-07-19 LAB — HEMOGLOBIN A1C: Hgb A1c MFr Bld: 8.2 % — ABNORMAL HIGH (ref 4.6–6.5)

## 2018-07-19 MED ORDER — CLONAZEPAM 0.5 MG PO TABS: 0.5000 mg | ORAL_TABLET | Freq: Two times a day (BID) | ORAL | 1 refills | Status: DC | PRN

## 2018-07-19 MED ORDER — IPRATROPIUM-ALBUTEROL 0.5-2.5 (3) MG/3ML IN SOLN: 3.0000 mL | Freq: Four times a day (QID) | RESPIRATORY_TRACT | 1 refills | Status: DC | PRN

## 2018-07-19 NOTE — Telephone Encounter (Signed)
Call received from lab  HGB 18 This is up from 04/16/18 when it was 17.9.  She has been seen by hematology

## 2018-07-20 ENCOUNTER — Other Ambulatory Visit: Payer: Self-pay | Admitting: Family Medicine

## 2018-07-21 ENCOUNTER — Encounter: Payer: Self-pay | Admitting: Family Medicine

## 2018-07-21 DIAGNOSIS — M25512 Pain in left shoulder: Secondary | ICD-10-CM | POA: Insufficient documentation

## 2018-07-21 NOTE — Assessment & Plan Note (Signed)
The patient is asked to make an attempt to improve diet and exercise patterns to aid in medical management of this problem.  

## 2018-07-21 NOTE — Assessment & Plan Note (Signed)
No medication issues. Feels that it is helpful. Will continue.

## 2018-07-21 NOTE — Assessment & Plan Note (Signed)
Review: taking medications as instructed, no medication side effects noted, no TIAs, no chest pain on exertion, no dyspnea on exertion, no swelling of ankles.  Smoker: Yes.    Wt Readings from Last 3 Encounters:  07/19/18 (!) 303 lb (137.4 kg)  04/16/18 (!) 311 lb 9.6 oz (141.3 kg)  03/15/18 (!) 320 lb 9.6 oz (145.4 kg)   BP Readings from Last 3 Encounters:  07/19/18 130/78  04/16/18 124/70  03/15/18 118/68   Lab Results  Component Value Date   CREATININE 1.23 (H) 07/19/2018   Plan: Dietary sodium restriction. Regular aerobic exercise. Check blood pressures weekly and record.Marland Kitchen

## 2018-07-21 NOTE — Assessment & Plan Note (Signed)
Current medication includes prn Xanax. She has been using more lately due to family issues and new house issues. We discussed options for treatment of anxiety including therapy and/or medication. Will check basic labs to ensure thyroid is in normal range and that no other metabolic issues are obvious. Discussed potential risks, expected benefits, possible side effects of the medicine. We also discussed how to take it correctly and dosing instructions. If she has any significant side effects to the medicine, she is to stop it and call for advice. Instructed patient to contact office or on-call physician promptly should condition worsen or any new symptoms appear. She was agreeable with this plan.

## 2018-07-21 NOTE — Assessment & Plan Note (Signed)
The patient was counseled on the dangers of tobacco use, and was advised to quit.  Reviewed strategies to maximize success, including removing cigarettes and smoking materials from environment, stress management, support of family/friends, written materials, local smoking cessation programs (1-800-QUIT-NOW and SMOKEFREE.GOV) and pharmacotherapy. Greater than 3 minutes were spent on counseling today. 

## 2018-07-21 NOTE — Assessment & Plan Note (Signed)
Patient with long-standing hypothyroidism, on levothyroxine therapy. She appears euthyroid. She does not appear to have a goiter, thyroid nodules, or neck compression symptoms. We discussed about correct intake of levothyroxine, fasting, with water, separated by at least 30 minutes from breakfast, and separated by more than 4 hours from calcium, iron, multivitamins, acid reflux medications (PPIs). Will check thyroid tests today: TSH, free T4.  If labs today are abnormal, she will need to return in ~6 weeks for repeat labs. Otherwise, I will see her back in 3 months.

## 2018-07-21 NOTE — Assessment & Plan Note (Signed)
Recommendations: 1.  Patient is counseled on appropriate foot care. 2.  BP goal < 130/80. 3.  LDL goal of < 100, HDL > 40 and TG < 150.  4.  Eye Exam yearly and Dental Exam every 6 months. 5.  Dietary recommendations: < 100 g carbohydrates daily. 6.  Physical Activity recommendations: as tolerated. 7.  Pneumovax at diagnosis and once 65+. Wait five years between first dose and dose after 65.  8.  Influenza annually.    Diabetes Health Maintenance Due  Topic Date Due  . OPHTHALMOLOGY EXAM  10/02/2018  . FOOT EXAM  12/02/2018  . HEMOGLOBIN A1C  01/18/2019  . URINE MICROALBUMIN  03/16/2019   Diabetes QM Metrics Latest Ref Rng & Units 07/19/2018 03/15/2018 12/01/2017 10/02/2017 08/28/2017  HbA1c 4.6 - 6.5 % 8.2(H) 8.0(H) 6.7(H) - 7.2(H)  Microalb 0.0 - 1.9 mg/dL - 1.0 - - -  Micro/Creat Ratio 0.0 - 30.0 mg/g - 1.1 - - -  LDL Cacl 0 - 99 mg/dL 41 - - - 44  Eye Exam No Retinopathy - - - No Retinopathy -  Some recent data might be hidden   BP& BMI  07/19/2018 04/16/2018 03/15/2018 02/01/2018 12/01/2017  BP 130/78 124/70 118/68 120/68 112/62  BMI 50.42 51.85 53.35 52.95 51.42  Some recent data might be hidden

## 2018-07-21 NOTE — Assessment & Plan Note (Signed)
Chronic. Intermittent. Sometimes, unable to abduct arm. Pain at proximal deltoid insertion. Discussed treatment options, including ice, PT, and possible injection.

## 2018-07-21 NOTE — Assessment & Plan Note (Addendum)
Is the patient taking medications without problems? Yes Does the patient complain of muscle aches?  No Trying to exercise on a regular basis? No Compliant with diet? No    Lipids:    Component Value Date/Time   CHOL 112 07/19/2018 1157   TRIG 151.0 (H) 07/19/2018 1157   HDL 40.90 07/19/2018 1157   LDLDIRECT 57.0 11/27/2016 1605   VLDL 30.2 07/19/2018 1157   CHOLHDL 3 07/19/2018 1157    Plan: Continue current treatment.

## 2018-07-21 NOTE — Assessment & Plan Note (Signed)
Patient was told at the last visit that it was time to repeat phlebotomy, but she has not called for an appointment yet.   CBC Latest Ref Rng & Units 07/19/2018 04/16/2018 03/15/2018  WBC 4.0 - 10.5 K/uL 6.4 7.0 8.1  Hemoglobin 12.0 - 15.0 g/dL 18.0 Repeated and verified X2.([place]) 17.9(H) 17.6(H)  Hematocrit 36.0 - 46.0 % 54.2(H) 54.7 Repeated and verified X2.(H) 54.3 Repeated and verified X2.(H)  Platelets 150.0 - 400.0 K/uL 146.0(L) 152.0 179.0   Called patient and reiterated the importance of follow up for this.

## 2018-07-22 ENCOUNTER — Other Ambulatory Visit: Payer: Self-pay | Admitting: Family Medicine

## 2018-07-22 DIAGNOSIS — R600 Localized edema: Secondary | ICD-10-CM

## 2018-07-22 MED ORDER — SPIRONOLACTONE 25 MG PO TABS: ORAL_TABLET | ORAL | 0 refills | Status: DC

## 2018-07-22 NOTE — Telephone Encounter (Addendum)
Pt. Called to obtain lab results; see result note.  While discussing labs, pt. requested a refill on Spironalactone.    Also, reported she forgot to inform Dr. Juleen China when she was in office 10/28, of 3 areas on her skin; area on (L) side of nose, on bridge of nose, and on right buttocks.  Described the above areas as being "dry and rough"; questioned if this needs to be evaluated by a Dermatologist.  Is requesting a recommendation of Dermatologist, or questioned if Dr. Juleen China would like to look at the areas first?

## 2018-07-22 NOTE — Telephone Encounter (Signed)
Requested Prescriptions  Pending Prescriptions Disp Refills  . spironolactone (ALDACTONE) 25 MG tablet 90 tablet 0    Sig: TAKE 1 TABLET(25 MG) BY MOUTH DAILY     Cardiovascular: Diuretics - Aldosterone Antagonist Failed - 07/22/2018  2:39 PM      Failed - Cr in normal range and within 360 days    Creatinine  Date Value Ref Range Status  10/08/2017 0.98 0.60 - 1.10 mg/dL Final   Creat  Date Value Ref Range Status  10/31/2016 0.97 0.50 - 0.99 mg/dL Final    Comment:      For patients > or = 65 years of age: The upper reference limit for Creatinine is approximately 13% higher for people identified as African-American.      Creatinine, Ser  Date Value Ref Range Status  07/19/2018 1.23 (H) 0.40 - 1.20 mg/dL Final         Passed - K in normal range and within 360 days    Potassium  Date Value Ref Range Status  07/19/2018 4.0 3.5 - 5.1 mEq/L Final         Passed - Na in normal range and within 360 days    Sodium  Date Value Ref Range Status  07/19/2018 139 135 - 145 mEq/L Final         Passed - Last BP in normal range    BP Readings from Last 1 Encounters:  07/19/18 130/78         Passed - Valid encounter within last 6 months    Recent Outpatient Visits          3 days ago DM (diabetes mellitus) with complications ([place]), on Metformin and [place] Wallace, [place], DO   3 months ago Hypertension associated with diabetes [facility])   [place] Wallace, [place], DO   4 months ago Diabetes mellitus without complication [facility])   [place] Wallace, [place], DO   5 months ago Moderate persistent asthmatic bronchitis with acute exacerbation   [facility] [place], [place], DO   7 months ago Acquired hypothyroidism   [place] Wallace, [place], DO      Future Appointments            In 2 months Briscoe Deutscher, [place],  [facility]

## 2018-07-29 ENCOUNTER — Telehealth: Payer: Self-pay | Admitting: Hematology

## 2018-07-29 NOTE — Telephone Encounter (Signed)
Scheduled appt per sch message - pt is aware of appt.

## 2018-08-02 ENCOUNTER — Telehealth: Payer: Self-pay | Admitting: Family Medicine

## 2018-08-02 NOTE — Telephone Encounter (Signed)
See note. There is not a referral in the system  Copied from [place] [id number]. Topic: Referral - Status >> Aug 02, 2018  1:12 PM Selinda Flavin B, [place] wrote: Reason for CRM: Patient calling to check the status of the PT order for her arm. Please advise. CB#: [phone]

## 2018-08-02 NOTE — Telephone Encounter (Signed)
Ok to place order 

## 2018-08-02 NOTE — Telephone Encounter (Signed)
Okay 

## 2018-08-03 ENCOUNTER — Other Ambulatory Visit: Payer: Self-pay

## 2018-08-03 ENCOUNTER — Other Ambulatory Visit: Payer: Self-pay | Admitting: Family Medicine

## 2018-08-03 DIAGNOSIS — M79603 Pain in arm, unspecified: Secondary | ICD-10-CM

## 2018-08-03 NOTE — Progress Notes (Signed)
re

## 2018-08-03 NOTE — Telephone Encounter (Signed)
Order placed

## 2018-08-06 ENCOUNTER — Telehealth: Payer: Self-pay

## 2018-08-06 ENCOUNTER — Other Ambulatory Visit: Payer: Self-pay

## 2018-08-06 DIAGNOSIS — M79603 Pain in arm, unspecified: Secondary | ICD-10-CM

## 2018-08-06 NOTE — Telephone Encounter (Signed)
Order placed and patient informed

## 2018-08-06 NOTE — Telephone Encounter (Signed)
Copied from [place] [id number]. Topic: Referral - Question >> Aug 05, 2018  4:17 PM Micheline Chapman wrote: Reason for CRM: Patient called to schedule her physical therapy referral,  but then informed me that she wanted a physical therapist to come out to her home. I told her I would ask the nurse/Dr. To call her back with information regarding whether or not this was an option.

## 2018-08-06 NOTE — Telephone Encounter (Signed)
Yes, please make the referral for [facility] PT.

## 2018-08-11 ENCOUNTER — Telehealth: Payer: Self-pay | Admitting: Family Medicine

## 2018-08-11 DIAGNOSIS — M25512 Pain in left shoulder: Secondary | ICD-10-CM | POA: Diagnosis not present

## 2018-08-11 DIAGNOSIS — M109 Gout, unspecified: Secondary | ICD-10-CM | POA: Diagnosis not present

## 2018-08-11 DIAGNOSIS — J449 Chronic obstructive pulmonary disease, unspecified: Secondary | ICD-10-CM | POA: Diagnosis not present

## 2018-08-11 DIAGNOSIS — F419 Anxiety disorder, unspecified: Secondary | ICD-10-CM | POA: Diagnosis not present

## 2018-08-11 DIAGNOSIS — E1142 Type 2 diabetes mellitus with diabetic polyneuropathy: Secondary | ICD-10-CM | POA: Diagnosis not present

## 2018-08-11 DIAGNOSIS — I119 Hypertensive heart disease without heart failure: Secondary | ICD-10-CM | POA: Diagnosis not present

## 2018-08-11 DIAGNOSIS — I2510 Atherosclerotic heart disease of native coronary artery without angina pectoris: Secondary | ICD-10-CM | POA: Diagnosis not present

## 2018-08-11 DIAGNOSIS — I252 Old myocardial infarction: Secondary | ICD-10-CM | POA: Diagnosis not present

## 2018-08-11 DIAGNOSIS — G8929 Other chronic pain: Secondary | ICD-10-CM | POA: Diagnosis not present

## 2018-08-11 NOTE — Telephone Encounter (Signed)
Copied from [place] [id number]. Topic: Quick Communication - Home Health Verbal Orders >> Aug 11, 2018 12:51 PM Margot Ables wrote: Caller/Agency: Shanon Brow PT with [facility] Number: [phone], secure VM if not available Requesting OT/PT/Skilled Nursing/Social Work: VO to continue PT Frequency: requesting 2x week for 5 weeks to work on balance, gait, strength, range of motion, pain, and endurance.

## 2018-08-11 NOTE — Telephone Encounter (Signed)
See note

## 2018-08-12 NOTE — Telephone Encounter (Signed)
Called and gave verbal 

## 2018-08-13 DIAGNOSIS — E1142 Type 2 diabetes mellitus with diabetic polyneuropathy: Secondary | ICD-10-CM | POA: Diagnosis not present

## 2018-08-13 DIAGNOSIS — I119 Hypertensive heart disease without heart failure: Secondary | ICD-10-CM | POA: Diagnosis not present

## 2018-08-13 DIAGNOSIS — I252 Old myocardial infarction: Secondary | ICD-10-CM | POA: Diagnosis not present

## 2018-08-13 DIAGNOSIS — F419 Anxiety disorder, unspecified: Secondary | ICD-10-CM | POA: Diagnosis not present

## 2018-08-13 DIAGNOSIS — G8929 Other chronic pain: Secondary | ICD-10-CM | POA: Diagnosis not present

## 2018-08-13 DIAGNOSIS — M25512 Pain in left shoulder: Secondary | ICD-10-CM | POA: Diagnosis not present

## 2018-08-13 DIAGNOSIS — J449 Chronic obstructive pulmonary disease, unspecified: Secondary | ICD-10-CM | POA: Diagnosis not present

## 2018-08-13 DIAGNOSIS — I2510 Atherosclerotic heart disease of native coronary artery without angina pectoris: Secondary | ICD-10-CM | POA: Diagnosis not present

## 2018-08-13 DIAGNOSIS — M109 Gout, unspecified: Secondary | ICD-10-CM | POA: Diagnosis not present

## 2018-08-16 ENCOUNTER — Telehealth: Payer: Self-pay | Admitting: Family Medicine

## 2018-08-16 ENCOUNTER — Other Ambulatory Visit: Payer: Self-pay | Admitting: Family Medicine

## 2018-08-16 DIAGNOSIS — E039 Hypothyroidism, unspecified: Secondary | ICD-10-CM

## 2018-08-16 NOTE — Telephone Encounter (Signed)
See note

## 2018-08-16 NOTE — Telephone Encounter (Signed)
Copied from [place]. Topic: Quick Communication - See Telephone Encounter >> Aug 16, 2018  4:54 PM Bea Graff, NT wrote: CRM for notification. See Telephone encounter for: 08/16/18. Shanon Brow with [facility] calling to move pts PT orders for this week to the end of her frequency due to pt stated she has a URI and would like to postpone this week appts. CB#: [phone]

## 2018-08-17 NOTE — Telephone Encounter (Signed)
Called David left detailed message on his personal voicemail okay to move pt's PT to the end of the week due to not feeling well can resume when pt is feeling better. Any questions please call office.

## 2018-08-17 NOTE — Progress Notes (Signed)
HEMATOLOGY/ONCOLOGY CONSULTATION NOTE  Date of Service: 08/18/2018  Patient Care Team: Briscoe Deutscher, DO as PCP - General (Family Medicine) Skeet Latch, MD as Attending Physician (Cardiology) Ardath Sax, MD as Consulting Physician (Hematology and Oncology)  CHIEF COMPLAINTS/PURPOSE OF CONSULTATION:  Polycythemia   HISTORY OF PRESENTING ILLNESS:  [name] [name] is a wonderful 67 y.o. female who has been previously seen by my colleague [name] for evaluation and management of Polycythemia, thought to be secondary. She last saw [name] in March 2019. The pt reports that she is doing well overall.   The pt reports that she has not been using a CPAP machine because she feels claustrophobic. She notes that she is smoking a half pack of cigarettes per day at this time.  The pt notes that her DM has been well controlled overall, but was 242 this morning which she notes is not the norm for her. She notes that she tries to stay well hydrated. She is currently taking Invokana, in addition to a fluid pill. The pt denies any current leg swelling, and also denies itching and skin rashes. She endorses stable energy levels.   The pt denies any history of blood clots.   Most recent lab results (07/19/18) of CBC w/diff and CMP is as follows: all values are WNL except for RBC at 5.60, Hgb at 18.0, HCT at 54.2, RDW at 16.0, PLT at 146k, Glucose at 166, Creatinine at 1.23.  On review of systems, pt reports stable energy levels, staying hydrated, urinating frequently, and denies leg swelling, skin rashes, itching, abdominal pains, and any other symptoms.   MEDICAL HISTORY:  Past Medical History:  Diagnosis Date  . Bronchitis, asthmatic   . Coronary artery disease    BMS to LAD 02/2008  . Diabetes mellitus without complication ([place])   . Hyperlipidemia   . Hypertensive heart disease 04/16/2016  . Hypothyroidism   . MI, old   . Morbid obesity ([place]) 04/16/2016  . S/P coronary artery  stent placement 04/16/2016   BMS to LAD 2009  . Tobacco abuse 04/16/2016    SURGICAL HISTORY: Past Surgical History:  Procedure Laterality Date  . ABDOMINAL HYSTERECTOMY    . ANKLE SURGERY Right   . CHOLECYSTECTOMY    . CORONARY ANGIOPLASTY  02/2008   bare metal stent to LAD   . CORONARY STENT PLACEMENT    . KNEE ARTHROSCOPY    . ROTATOR CUFF REPAIR Bilateral   . THORACIC OUTLET SURGERY    . WRIST SURGERY      SOCIAL HISTORY: Social History   Socioeconomic History  . Marital status: Married    Spouse name: Not on file  . Number of children: Not on file  . Years of education: Not on file  . Highest education level: Not on file  Occupational History  . Not on file  Social Needs  . Financial resource strain: Not on file  . Food insecurity:    Worry: Not on file    Inability: Not on file  . Transportation needs:    Medical: Not on file    Non-medical: Not on file  Tobacco Use  . Smoking status: Current Every Day Smoker    Packs/day: 0.50    Years: 31.00    Pack years: 15.50  . Smokeless tobacco: Never Used  Substance and Sexual Activity  . Alcohol use: No  . Drug use: No  . Sexual activity: Yes    Partners: Male  Lifestyle  .  Physical activity:    Days per week: Not on file    Minutes per session: Not on file  . Stress: Not on file  Relationships  . Social connections:    Talks on phone: Not on file    Gets together: Not on file    Attends religious service: Not on file    Active member of club or organization: Not on file    Attends meetings of clubs or organizations: Not on file    Relationship status: Not on file  . Intimate partner violence:    Fear of current or ex partner: Not on file    Emotionally abused: Not on file    Physically abused: Not on file    Forced sexual activity: Not on file  Other Topics Concern  . Not on file  Social History Narrative        FAMILY HISTORY: Family History  Problem Relation Age of Onset  . Cancer Mother     . Diabetes Son   . Hypertension Son     ALLERGIES:  is allergic to clindamycin/lincomycin; codeine; dilaudid [hydromorphone hcl]; iodides; reglan [metoclopramide]; reglan [metoclopramide]; tramadol; dilaudid [hydromorphone hcl]; and tape.  MEDICATIONS:  Current Outpatient Medications  Medication Sig Dispense Refill  . albuterol (ACCUNEB) 1.25 MG/3ML nebulizer solution Take 3 mLs (1.25 mg total) by nebulization 3 (three) times daily as needed for wheezing. 75 mL 2  . albuterol (PROAIR HFA) 108 (90 Base) MCG/ACT inhaler Inhale 2 puffs into the lungs every 6 (six) hours as needed for wheezing or shortness of breath. 1 Inhaler 1  . ALPRAZolam (XANAX) 0.5 MG tablet TAKE 1 TABLET BY MOUTH TWICE DAILY AS NEEDED FOR ANXIETY 60 tablet 0  . aspirin 81 MG tablet Take 81 mg by mouth daily.    Marland Kitchen atorvastatin (LIPITOR) 10 MG tablet TAKE 1 TABLET(10 MG) BY MOUTH DAILY 90 tablet 2  . celecoxib (CELEBREX) 200 MG capsule TAKE 1 CAPSULE(200 MG) BY MOUTH DAILY 30 capsule 3  . clonazePAM (KLONOPIN) 0.5 MG tablet Take 1 tablet (0.5 mg total) by mouth 2 (two) times daily as needed for anxiety. 20 tablet 1  . clopidogrel (PLAVIX) 75 MG tablet Take 1 tablet (75 mg total) by mouth daily. 90 tablet 2  . colchicine 0.6 MG tablet Take 1 tablet (0.6 mg total) by mouth daily. 90 tablet 0  . furosemide (LASIX) 20 MG tablet Take 1 tablet (20 mg total) by mouth daily. 30 tablet 3  . gabapentin (NEURONTIN) 300 MG capsule TAKE 1 CAPSULE(300 MG) BY MOUTH THREE TIMES DAILY 90 capsule 0  . glucose blood test strip Use as instructed 100 each 12  . INVOKANA 300 MG TABS tablet TAKE 1 TABLET(300 MG) BY MOUTH DAILY 90 tablet 1  . ipratropium-albuterol (DUONEB) 0.5-2.5 (3) MG/3ML SOLN USE 3 ML VIA NEBULIZER EVERY 6 HOURS AS NEEDED 3240 mL 1  . isosorbide mononitrate (IMDUR) 60 MG 24 hr tablet Take 1 tablet (60 mg total) by mouth daily. 90 tablet 2  . levothyroxine (SYNTHROID, LEVOTHROID) 112 MCG tablet TAKE 1 TABLET(112 MCG) BY MOUTH  DAILY 90 tablet 0  . metFORMIN (GLUCOPHAGE) 500 MG tablet TAKE 1 TABLET(500 MG) BY MOUTH DAILY 90 tablet 0  . metoprolol succinate (TOPROL-XL) 50 MG 24 hr tablet TAKE 1 1/2 TABLETS BY MOUTH EVERY DAY 90 tablet 2  . spironolactone (ALDACTONE) 25 MG tablet TAKE 1 TABLET(25 MG) BY MOUTH DAILY 90 tablet 0  . SYMBICORT 80-4.5 MCG/ACT inhaler INHALE 2 PUFFS BY MOUTH TWICE  DAILY 10.2 g 3   No current facility-administered medications for this visit.     REVIEW OF SYSTEMS:    10 Point review of Systems was done is negative except as noted above.  PHYSICAL EXAMINATION:  . Vitals:   08/18/18 0938  BP: 104/61  Pulse: 70  Resp: 18  Temp: 98.3 F (36.8 C)  SpO2: 93%   Filed Weights   08/18/18 0938  Weight: (!) 302 lb 4.8 oz (137.1 kg)   .Body mass index is 50.31 kg/m.  GENERAL:alert, in no acute distress and comfortable SKIN: no acute rashes, no significant lesions EYES: conjunctiva are pink and non-injected, sclera anicteric OROPHARYNX: MMM, no exudates, no oropharyngeal erythema or ulceration NECK: supple, no JVD LYMPH:  no palpable lymphadenopathy in the cervical, axillary or inguinal regions LUNGS: clear to auscultation b/l with normal respiratory effort HEART: regular rate & rhythm ABDOMEN:  normoactive bowel sounds , non tender, not distended. Extremity: no pedal edema PSYCH: alert & oriented x 3 with fluent speech NEURO: no focal motor/sensory deficits  LABORATORY DATA:  I have reviewed the data as listed  . CBC Latest Ref Rng & Units 07/19/2018 04/16/2018 03/15/2018  WBC 4.0 - 10.5 K/uL 6.4 7.0 8.1  Hemoglobin 12.0 - 15.0 g/dL 18.0 Repeated and verified X2.([place]) 17.9(H) 17.6(H)  Hematocrit 36.0 - 46.0 % 54.2(H) 54.7 Repeated and verified X2.(H) 54.3 Repeated and verified X2.(H)  Platelets 150.0 - 400.0 K/uL 146.0(L) 152.0 179.0    . CMP Latest Ref Rng & Units 07/19/2018 04/16/2018 03/15/2018  Glucose 70 - 99 mg/dL 166(H) 297(H) 165(H)  BUN 6 - 23 mg/dL 20 19 16     Creatinine 0.40 - 1.20 mg/dL 1.23(H) 1.13 0.94  Sodium 135 - 145 mEq/L 139 140 138  Potassium 3.5 - 5.1 mEq/L 4.0 4.4 4.4  Chloride 96 - 112 mEq/L 101 97 100  CO2 19 - 32 mEq/L 29 37(H) 30  Calcium 8.4 - 10.5 mg/dL 9.7 9.7 9.8  Total Protein 6.0 - 8.3 g/dL 7.2 7.5 7.4  Total Bilirubin 0.2 - 1.2 mg/dL 0.9 1.0 0.8  Alkaline Phos 39 - 117 U/L 76 88 87  AST 0 - 37 U/L 13 18 15   ALT 0 - 35 U/L 13 18 16      RADIOGRAPHIC STUDIES: I have personally reviewed the radiological images as listed and agreed with the findings in the report. No results found.  ASSESSMENT & PLAN:  67 y.o. female with  1. Polycythemia -Discussed patient's most recent labs from 07/19/18, HGB at 18.0, HCT at 54.2, no leukocytosis, mild thrombocytopenia with PLT at 146k -Patient is newly establishing her care with me today  -Discussed that primary polycythemia has not been ruled out as mutation studies have not been completed yet, which I recommend as the goals for her HCT levels would be different if she was found to have polycythemia vera  -Discussed that untreated sleep apnea and smoking cigarettes can be causes of secondary polycythemia and counseled pt towards smoking cessation  -Also discussed that Invokana and Lasix can cause the appearance of polycythemia due to less water in the blood -Will set the pt up for a therapeutic phlebotomy -Will order additional labs -Will see the pt back in 8 weeks     Labs today Therapeutic phlebotomy in 7-10 days RTC with Dr Irene Limbo in 2 months with labs and therapeutic phlebotomy appointment    All of the patients questions were answered with apparent satisfaction. The patient knows to call the clinic with any problems,  questions or concerns.  The total time spent in the appt was 25 minutes and more than 50% was on counseling and direct patient cares.    Sullivan Lone MD MS AAHIVMS [facility] Hematology/Oncology Physician [facility]  (Office):        [phone] (Work cell):  [phone] (Fax):           [phone]  08/18/2018 10:41 AM  I, Baldwin [place], am acting as a scribe for Dr. Sullivan Lone.   .I have reviewed the above documentation for accuracy and completeness, and I agree with the above. Brunetta Genera MD

## 2018-08-18 ENCOUNTER — Inpatient Hospital Stay: Payer: Medicare HMO

## 2018-08-18 ENCOUNTER — Inpatient Hospital Stay: Payer: Medicare HMO | Attending: Hematology | Admitting: Hematology

## 2018-08-18 ENCOUNTER — Telehealth: Payer: Self-pay | Admitting: Hematology

## 2018-08-18 VITALS — BP 104/61 | HR 70 | Temp 98.3°F | Resp 18 | Ht 65.0 in | Wt 302.3 lb

## 2018-08-18 DIAGNOSIS — I10 Essential (primary) hypertension: Secondary | ICD-10-CM | POA: Insufficient documentation

## 2018-08-18 DIAGNOSIS — E119 Type 2 diabetes mellitus without complications: Secondary | ICD-10-CM

## 2018-08-18 DIAGNOSIS — Z720 Tobacco use: Secondary | ICD-10-CM | POA: Diagnosis not present

## 2018-08-18 DIAGNOSIS — G4730 Sleep apnea, unspecified: Secondary | ICD-10-CM | POA: Diagnosis not present

## 2018-08-18 DIAGNOSIS — D45 Polycythemia vera: Secondary | ICD-10-CM | POA: Diagnosis not present

## 2018-08-18 DIAGNOSIS — D751 Secondary polycythemia: Secondary | ICD-10-CM | POA: Insufficient documentation

## 2018-08-18 DIAGNOSIS — D696 Thrombocytopenia, unspecified: Secondary | ICD-10-CM

## 2018-08-18 LAB — CBC WITH DIFFERENTIAL/PLATELET
Abs Immature Granulocytes: 0.04 10*3/uL (ref 0.00–0.07)
Basophils Absolute: 0.1 10*3/uL (ref 0.0–0.1)
Basophils Relative: 1 %
EOS ABS: 0.2 10*3/uL (ref 0.0–0.5)
Eosinophils Relative: 2 %
HEMATOCRIT: 56.4 % — AB (ref 36.0–46.0)
HEMOGLOBIN: 18 g/dL — AB (ref 12.0–15.0)
Immature Granulocytes: 1 %
LYMPHS ABS: 2.2 10*3/uL (ref 0.7–4.0)
LYMPHS PCT: 25 %
MCH: 31.5 pg (ref 26.0–34.0)
MCHC: 31.9 g/dL (ref 30.0–36.0)
MCV: 98.8 fL (ref 80.0–100.0)
MONO ABS: 0.6 10*3/uL (ref 0.1–1.0)
Monocytes Relative: 7 %
NRBC: 0 % (ref 0.0–0.2)
Neutro Abs: 5.6 10*3/uL (ref 1.7–7.7)
Neutrophils Relative %: 64 %
Platelets: 172 10*3/uL (ref 150–400)
RBC: 5.71 MIL/uL — ABNORMAL HIGH (ref 3.87–5.11)
RDW: 14.6 % (ref 11.5–15.5)
WBC: 8.6 10*3/uL (ref 4.0–10.5)

## 2018-08-18 LAB — CMP (CANCER CENTER ONLY)
ALK PHOS: 98 U/L (ref 38–126)
ALT: 17 U/L (ref 0–44)
AST: 12 U/L — ABNORMAL LOW (ref 15–41)
Albumin: 3.8 g/dL (ref 3.5–5.0)
Anion gap: 12 (ref 5–15)
BILIRUBIN TOTAL: 0.7 mg/dL (ref 0.3–1.2)
BUN: 20 mg/dL (ref 8–23)
CALCIUM: 10.1 mg/dL (ref 8.9–10.3)
CO2: 30 mmol/L (ref 22–32)
Chloride: 100 mmol/L (ref 98–111)
Creatinine: 1.32 mg/dL — ABNORMAL HIGH (ref 0.44–1.00)
GFR, EST AFRICAN AMERICAN: 49 mL/min — AB (ref >60)
GFR, EST NON AFRICAN AMERICAN: 42 mL/min — AB (ref >60)
Glucose, Bld: 188 mg/dL — ABNORMAL HIGH (ref 70–99)
POTASSIUM: 4.3 mmol/L (ref 3.5–5.1)
Sodium: 142 mmol/L (ref 135–145)
TOTAL PROTEIN: 7.6 g/dL (ref 6.5–8.1)

## 2018-08-18 NOTE — Telephone Encounter (Signed)
Printed calendar and avs. °

## 2018-08-22 IMAGING — DX DG CHEST 2V
2 series · 2 of 2 positions shown · non-contrast
Comparison: 02/29/2016

CLINICAL DATA: Chronic dry cough.  COPD

EXAM:
CHEST  2 VIEW

[chest pa]
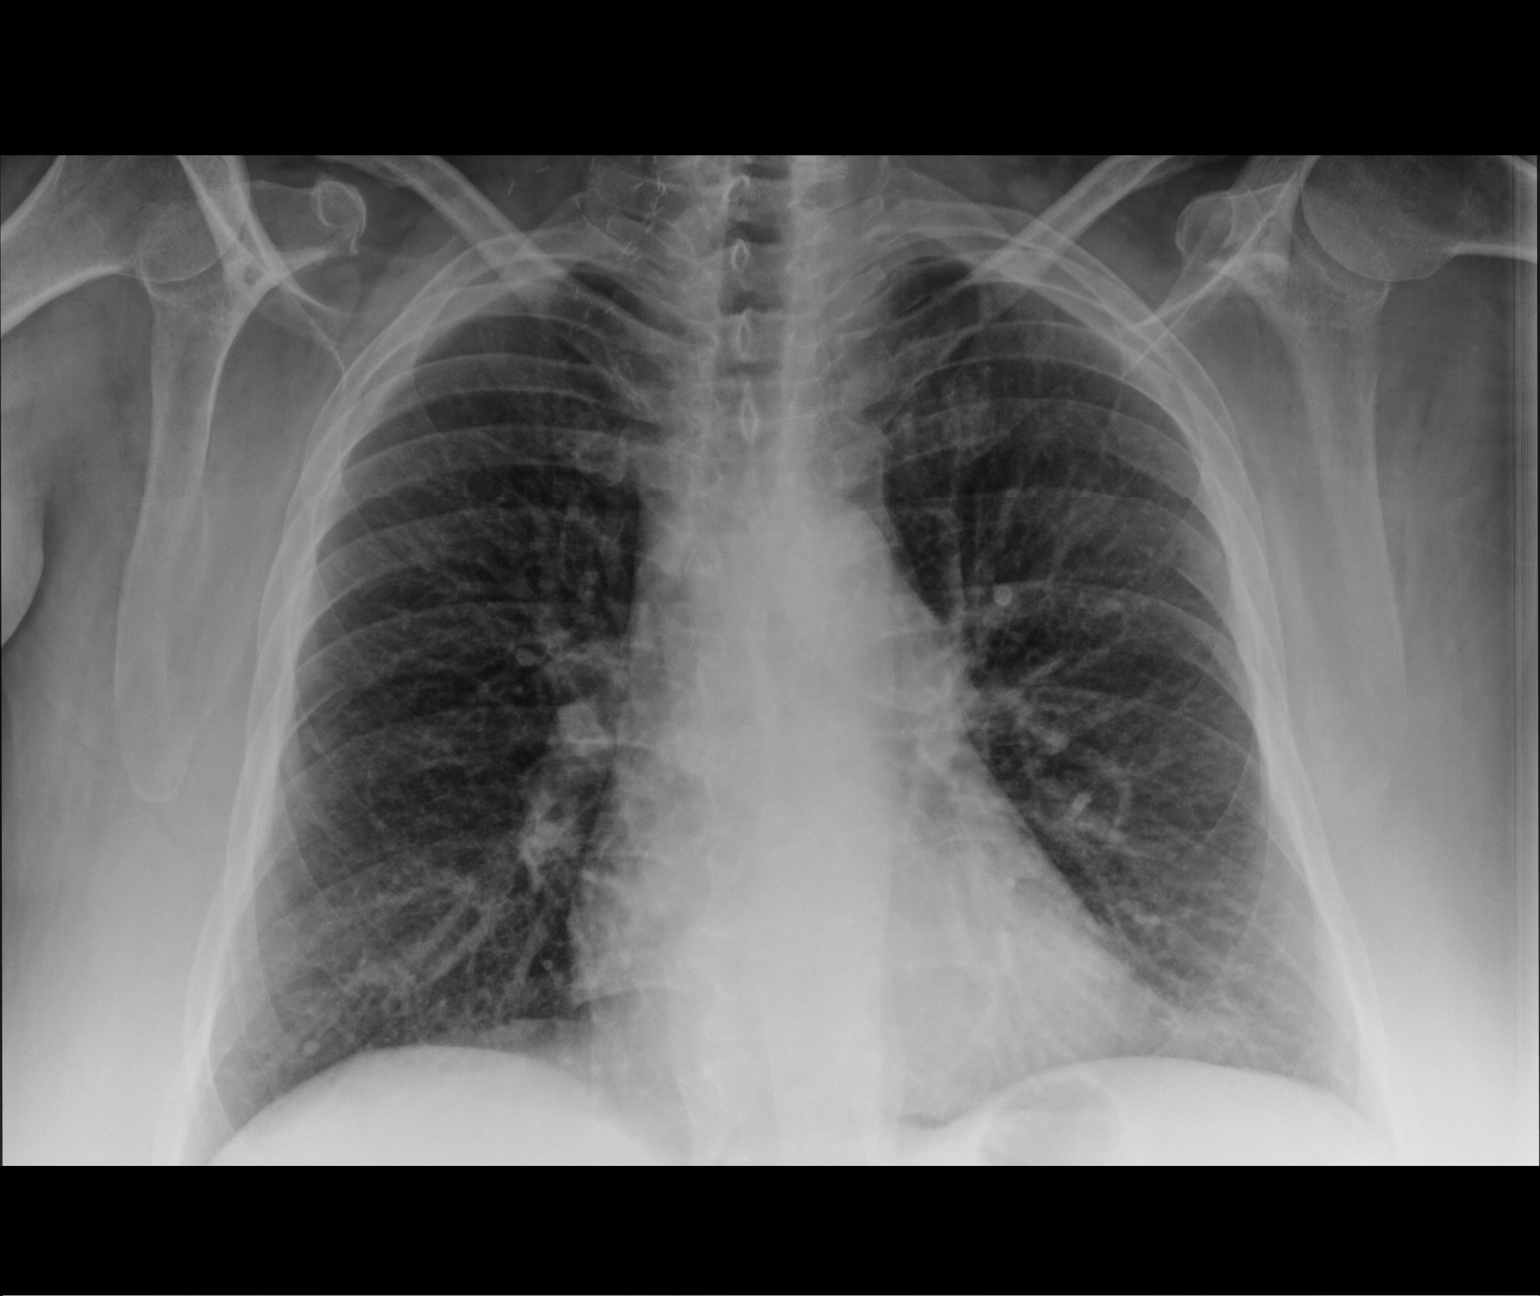

[chest lat]
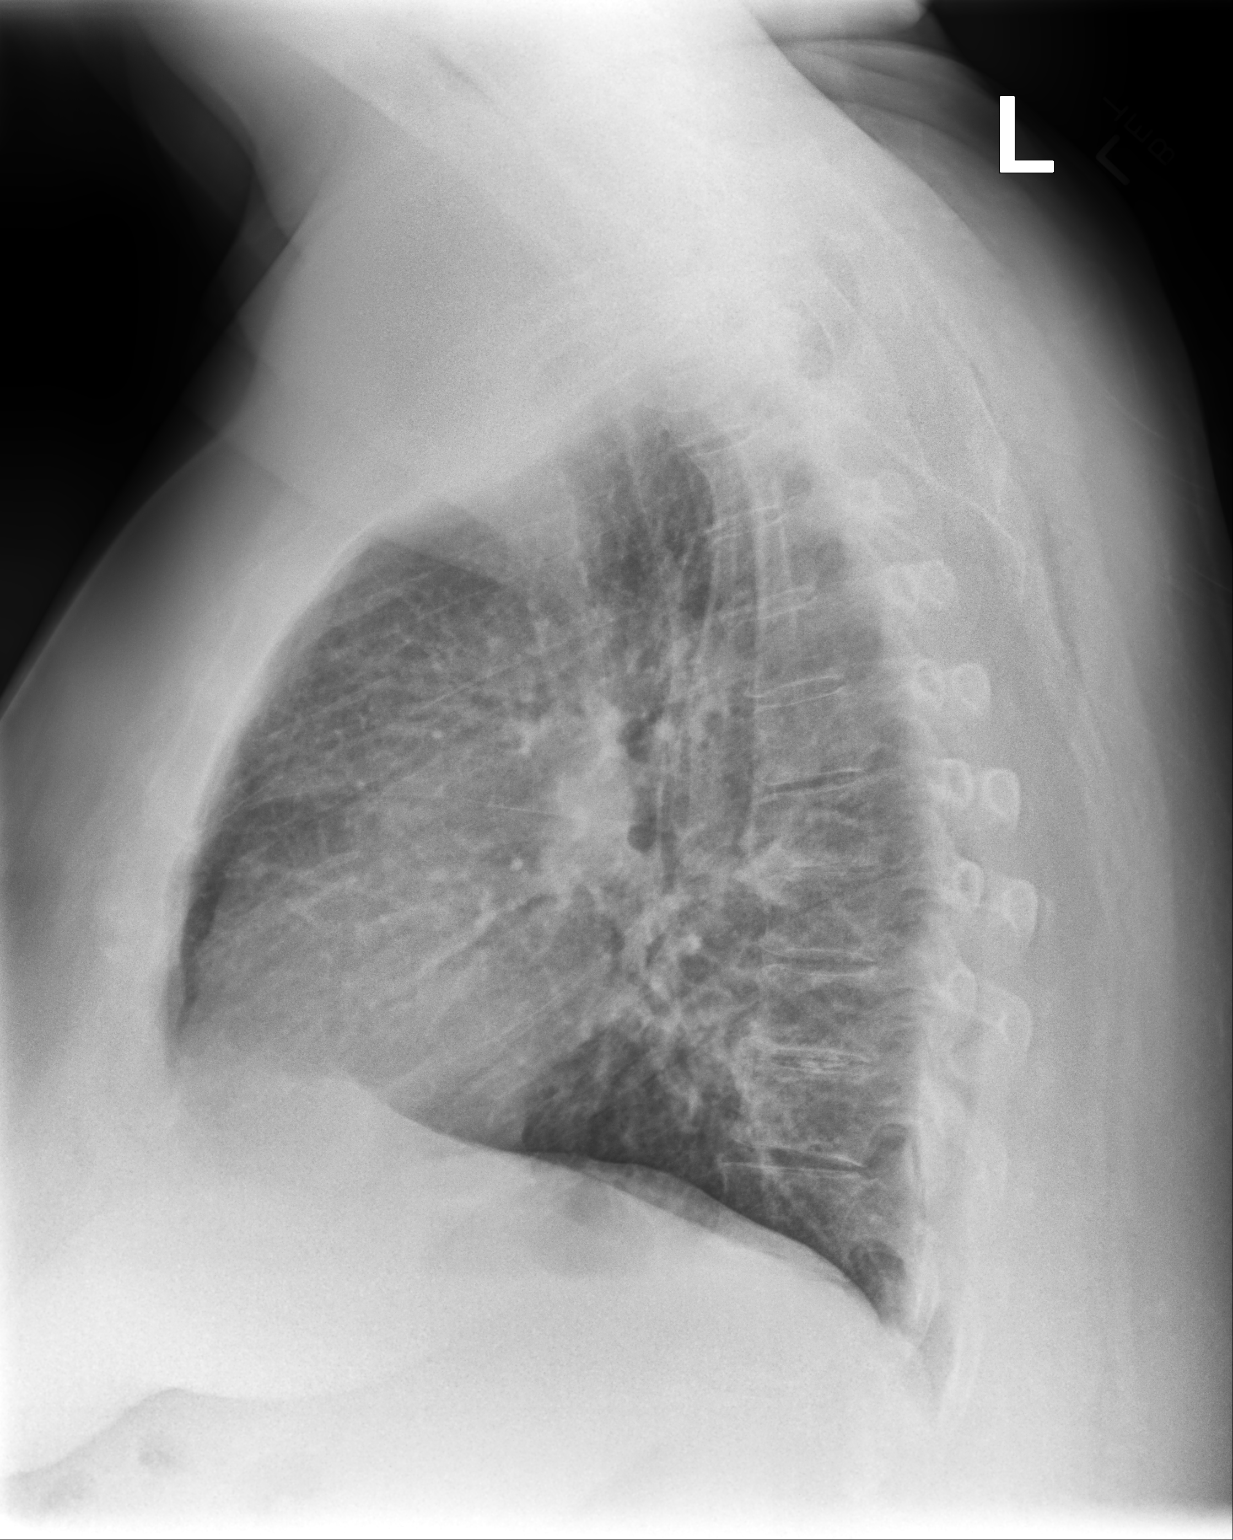

[2 of 2 positions shown; findings below may reference images not displayed]

FINDINGS: Peribronchial thickening. Diffuse interstitial prominence. Heart is
upper limits normal in size. No effusions. No acute bony
abnormality.
IMPRESSION: Bronchitic changes and possible chronic interstitial lung disease.

## 2018-08-23 ENCOUNTER — Other Ambulatory Visit: Payer: Self-pay | Admitting: Family Medicine

## 2018-08-23 DIAGNOSIS — J4541 Moderate persistent asthma with (acute) exacerbation: Secondary | ICD-10-CM

## 2018-08-25 DIAGNOSIS — F419 Anxiety disorder, unspecified: Secondary | ICD-10-CM | POA: Diagnosis not present

## 2018-08-25 DIAGNOSIS — I119 Hypertensive heart disease without heart failure: Secondary | ICD-10-CM | POA: Diagnosis not present

## 2018-08-25 DIAGNOSIS — M25512 Pain in left shoulder: Secondary | ICD-10-CM | POA: Diagnosis not present

## 2018-08-25 DIAGNOSIS — I252 Old myocardial infarction: Secondary | ICD-10-CM | POA: Diagnosis not present

## 2018-08-25 DIAGNOSIS — J449 Chronic obstructive pulmonary disease, unspecified: Secondary | ICD-10-CM | POA: Diagnosis not present

## 2018-08-25 DIAGNOSIS — E1142 Type 2 diabetes mellitus with diabetic polyneuropathy: Secondary | ICD-10-CM | POA: Diagnosis not present

## 2018-08-25 DIAGNOSIS — M109 Gout, unspecified: Secondary | ICD-10-CM | POA: Diagnosis not present

## 2018-08-25 DIAGNOSIS — G8929 Other chronic pain: Secondary | ICD-10-CM | POA: Diagnosis not present

## 2018-08-25 DIAGNOSIS — I2510 Atherosclerotic heart disease of native coronary artery without angina pectoris: Secondary | ICD-10-CM | POA: Diagnosis not present

## 2018-08-26 DIAGNOSIS — J449 Chronic obstructive pulmonary disease, unspecified: Secondary | ICD-10-CM | POA: Diagnosis not present

## 2018-08-26 DIAGNOSIS — I2510 Atherosclerotic heart disease of native coronary artery without angina pectoris: Secondary | ICD-10-CM | POA: Diagnosis not present

## 2018-08-26 DIAGNOSIS — G8929 Other chronic pain: Secondary | ICD-10-CM | POA: Diagnosis not present

## 2018-08-26 DIAGNOSIS — E1142 Type 2 diabetes mellitus with diabetic polyneuropathy: Secondary | ICD-10-CM | POA: Diagnosis not present

## 2018-08-26 DIAGNOSIS — M109 Gout, unspecified: Secondary | ICD-10-CM | POA: Diagnosis not present

## 2018-08-26 DIAGNOSIS — M25512 Pain in left shoulder: Secondary | ICD-10-CM | POA: Diagnosis not present

## 2018-08-26 DIAGNOSIS — I119 Hypertensive heart disease without heart failure: Secondary | ICD-10-CM | POA: Diagnosis not present

## 2018-08-26 DIAGNOSIS — F419 Anxiety disorder, unspecified: Secondary | ICD-10-CM | POA: Diagnosis not present

## 2018-08-26 DIAGNOSIS — I252 Old myocardial infarction: Secondary | ICD-10-CM | POA: Diagnosis not present

## 2018-08-27 ENCOUNTER — Inpatient Hospital Stay: Payer: Medicare HMO | Attending: Hematology

## 2018-08-27 VITALS — BP 103/73 | HR 68 | Temp 98.3°F | Resp 18

## 2018-08-27 DIAGNOSIS — D751 Secondary polycythemia: Secondary | ICD-10-CM

## 2018-08-27 NOTE — Patient Instructions (Signed)

## 2018-08-27 NOTE — Progress Notes (Signed)
Per MD Irene Limbo pt to receive therapeutic phlebotomy per recent labs. 16 G in RAC.  Started at 1247 and ended at 1255. 550 mls removed Tolerated well.  VSS.  WC with belongings and husband.

## 2018-08-31 DIAGNOSIS — I119 Hypertensive heart disease without heart failure: Secondary | ICD-10-CM | POA: Diagnosis not present

## 2018-08-31 DIAGNOSIS — M109 Gout, unspecified: Secondary | ICD-10-CM | POA: Diagnosis not present

## 2018-08-31 DIAGNOSIS — F419 Anxiety disorder, unspecified: Secondary | ICD-10-CM | POA: Diagnosis not present

## 2018-08-31 DIAGNOSIS — I252 Old myocardial infarction: Secondary | ICD-10-CM | POA: Diagnosis not present

## 2018-08-31 DIAGNOSIS — M25512 Pain in left shoulder: Secondary | ICD-10-CM | POA: Diagnosis not present

## 2018-08-31 DIAGNOSIS — J449 Chronic obstructive pulmonary disease, unspecified: Secondary | ICD-10-CM | POA: Diagnosis not present

## 2018-08-31 DIAGNOSIS — G8929 Other chronic pain: Secondary | ICD-10-CM | POA: Diagnosis not present

## 2018-08-31 DIAGNOSIS — I2510 Atherosclerotic heart disease of native coronary artery without angina pectoris: Secondary | ICD-10-CM | POA: Diagnosis not present

## 2018-08-31 DIAGNOSIS — E1142 Type 2 diabetes mellitus with diabetic polyneuropathy: Secondary | ICD-10-CM | POA: Diagnosis not present

## 2018-09-02 DIAGNOSIS — I252 Old myocardial infarction: Secondary | ICD-10-CM | POA: Diagnosis not present

## 2018-09-02 DIAGNOSIS — G8929 Other chronic pain: Secondary | ICD-10-CM | POA: Diagnosis not present

## 2018-09-02 DIAGNOSIS — I2510 Atherosclerotic heart disease of native coronary artery without angina pectoris: Secondary | ICD-10-CM | POA: Diagnosis not present

## 2018-09-02 DIAGNOSIS — M25512 Pain in left shoulder: Secondary | ICD-10-CM | POA: Diagnosis not present

## 2018-09-02 DIAGNOSIS — J449 Chronic obstructive pulmonary disease, unspecified: Secondary | ICD-10-CM | POA: Diagnosis not present

## 2018-09-02 DIAGNOSIS — I119 Hypertensive heart disease without heart failure: Secondary | ICD-10-CM | POA: Diagnosis not present

## 2018-09-02 DIAGNOSIS — E1142 Type 2 diabetes mellitus with diabetic polyneuropathy: Secondary | ICD-10-CM | POA: Diagnosis not present

## 2018-09-02 DIAGNOSIS — M109 Gout, unspecified: Secondary | ICD-10-CM | POA: Diagnosis not present

## 2018-09-02 DIAGNOSIS — F419 Anxiety disorder, unspecified: Secondary | ICD-10-CM | POA: Diagnosis not present

## 2018-09-03 LAB — JAK2 (INCLUDING V617F AND EXON 12), MPL,& CALR-NEXT GEN SEQ

## 2018-09-06 ENCOUNTER — Telehealth: Payer: Self-pay | Admitting: Family Medicine

## 2018-09-06 ENCOUNTER — Telehealth: Payer: Self-pay | Admitting: Cardiovascular Disease

## 2018-09-06 DIAGNOSIS — I2510 Atherosclerotic heart disease of native coronary artery without angina pectoris: Secondary | ICD-10-CM | POA: Diagnosis not present

## 2018-09-06 DIAGNOSIS — M25512 Pain in left shoulder: Secondary | ICD-10-CM | POA: Diagnosis not present

## 2018-09-06 DIAGNOSIS — F17210 Nicotine dependence, cigarettes, uncomplicated: Secondary | ICD-10-CM

## 2018-09-06 DIAGNOSIS — Z7984 Long term (current) use of oral hypoglycemic drugs: Secondary | ICD-10-CM

## 2018-09-06 DIAGNOSIS — I119 Hypertensive heart disease without heart failure: Secondary | ICD-10-CM | POA: Diagnosis not present

## 2018-09-06 DIAGNOSIS — M109 Gout, unspecified: Secondary | ICD-10-CM | POA: Diagnosis not present

## 2018-09-06 DIAGNOSIS — I252 Old myocardial infarction: Secondary | ICD-10-CM | POA: Diagnosis not present

## 2018-09-06 DIAGNOSIS — Z7902 Long term (current) use of antithrombotics/antiplatelets: Secondary | ICD-10-CM

## 2018-09-06 DIAGNOSIS — Z955 Presence of coronary angioplasty implant and graft: Secondary | ICD-10-CM

## 2018-09-06 DIAGNOSIS — Z9181 History of falling: Secondary | ICD-10-CM

## 2018-09-06 DIAGNOSIS — Z7951 Long term (current) use of inhaled steroids: Secondary | ICD-10-CM

## 2018-09-06 DIAGNOSIS — D751 Secondary polycythemia: Secondary | ICD-10-CM

## 2018-09-06 DIAGNOSIS — E1142 Type 2 diabetes mellitus with diabetic polyneuropathy: Secondary | ICD-10-CM | POA: Diagnosis not present

## 2018-09-06 DIAGNOSIS — J449 Chronic obstructive pulmonary disease, unspecified: Secondary | ICD-10-CM | POA: Diagnosis not present

## 2018-09-06 DIAGNOSIS — F419 Anxiety disorder, unspecified: Secondary | ICD-10-CM | POA: Diagnosis not present

## 2018-09-06 DIAGNOSIS — G4733 Obstructive sleep apnea (adult) (pediatric): Secondary | ICD-10-CM

## 2018-09-06 DIAGNOSIS — G8929 Other chronic pain: Secondary | ICD-10-CM | POA: Diagnosis not present

## 2018-09-06 DIAGNOSIS — Z6841 Body Mass Index (BMI) 40.0 and over, adult: Secondary | ICD-10-CM

## 2018-09-06 DIAGNOSIS — Z7982 Long term (current) use of aspirin: Secondary | ICD-10-CM

## 2018-09-06 NOTE — Telephone Encounter (Signed)
See note

## 2018-09-06 NOTE — Addendum Note (Signed)
Addended by: Kathyrn Lass on: 09/06/2018 12:37 PM   Modules accepted: Orders

## 2018-09-06 NOTE — Telephone Encounter (Signed)
Returned call to patient no answer.LMTC. 

## 2018-09-06 NOTE — Telephone Encounter (Signed)
New Message:   Patient calling concerning some medications issue BP has been low. Patient stop taking the medication and would like for some one to call her back. She would like to know if she need to stop or keep taken it. Please call patient.

## 2018-09-06 NOTE — Telephone Encounter (Signed)
Received a call from patient.Stated she wanted Dr.Noble to know she stopped taking Metoprolol last Friday due to low B/P 90/60,103/65.Stated B/P better 120/76.Advised I will send message to [place].

## 2018-09-06 NOTE — Telephone Encounter (Signed)
FYI

## 2018-09-06 NOTE — Telephone Encounter (Signed)
Okay to send in Ortho referral if patient wants.

## 2018-09-06 NOTE — Telephone Encounter (Signed)
Copied from [place] [id number]. Topic: Quick Communication - See Telephone Encounter >> Sep 06, 2018 12:52 PM Conception Chancy, [place] wrote: Reason for CRM: Shanon Brow is a physical therapist with [facility] and states that the patients left shoulder is not getting better and would like to see a orthopedic so therefore they will be discharging her on the next visit 09/08/18. Please advise.   CB# [phone]

## 2018-09-07 ENCOUNTER — Other Ambulatory Visit: Payer: Self-pay

## 2018-09-07 DIAGNOSIS — M79603 Pain in arm, unspecified: Secondary | ICD-10-CM

## 2018-09-07 NOTE — Telephone Encounter (Signed)
OK thank you 

## 2018-09-07 NOTE — Progress Notes (Signed)
e

## 2018-09-07 NOTE — Telephone Encounter (Signed)
Referral placed.

## 2018-09-08 DIAGNOSIS — I119 Hypertensive heart disease without heart failure: Secondary | ICD-10-CM | POA: Diagnosis not present

## 2018-09-08 DIAGNOSIS — I252 Old myocardial infarction: Secondary | ICD-10-CM | POA: Diagnosis not present

## 2018-09-08 DIAGNOSIS — E1142 Type 2 diabetes mellitus with diabetic polyneuropathy: Secondary | ICD-10-CM | POA: Diagnosis not present

## 2018-09-08 DIAGNOSIS — G8929 Other chronic pain: Secondary | ICD-10-CM | POA: Diagnosis not present

## 2018-09-08 DIAGNOSIS — M109 Gout, unspecified: Secondary | ICD-10-CM | POA: Diagnosis not present

## 2018-09-08 DIAGNOSIS — J449 Chronic obstructive pulmonary disease, unspecified: Secondary | ICD-10-CM | POA: Diagnosis not present

## 2018-09-08 DIAGNOSIS — F419 Anxiety disorder, unspecified: Secondary | ICD-10-CM | POA: Diagnosis not present

## 2018-09-08 DIAGNOSIS — I2510 Atherosclerotic heart disease of native coronary artery without angina pectoris: Secondary | ICD-10-CM | POA: Diagnosis not present

## 2018-09-08 DIAGNOSIS — M25512 Pain in left shoulder: Secondary | ICD-10-CM | POA: Diagnosis not present

## 2018-09-17 ENCOUNTER — Ambulatory Visit: Payer: Self-pay | Admitting: *Deleted

## 2018-09-17 NOTE — Telephone Encounter (Signed)
See note

## 2018-09-17 NOTE — Telephone Encounter (Signed)
Message from Berneta Levins sent at 09/17/2018 2:35 PM EST   Summary: medication not working   Pt states that her clonazePAM (KLONOPIN) 0.5 MG tablet Isn't working. Pt states that she feels depressed on this medication (but states she is not a harm to herself or others). Pt wants to know if she can be put back on her old medication. Pt states that she just continues to cry on the Klonopin. States she is not herself and would like to speak with a nurse about this.         Pt calling stating that she feels like the Klonopin is not helping and would like to go back to taking Xanax. Pt states "all I do is cry and I don't feel like myself". Pt states she really likes christmas but she just cried during the holiday and states it is a difficult time for her due to having losses in her family. Pt denies any suicidal or homicidal ideation at this time. Pt offered to make sooner appt than the one previously scheduled on 10/18/18 but pt declines at this time. Pt states she would just like her medication to be changed back to Xanax instead of Klonopin.   Reason for Disposition . [1] Follow-up call from patient regarding patient's clinical status AND [2] information urgent  Answer Assessment - Initial Assessment Questions 1. REASON FOR CALL or QUESTION: "What is your reason for calling today?" or "How can I best help you?" or "What question do you have that I can help answer?"     Request for medication to be changed 2. CALLER: Document the source of call. (e.g., laboratory, patient).     patient  Protocols used: PCP CALL - NO TRIAGE-A-AH

## 2018-09-17 NOTE — Telephone Encounter (Signed)
FYI ok to change?

## 2018-09-18 NOTE — Telephone Encounter (Signed)
We need to discuss this in person. Needs visit.

## 2018-09-20 NOTE — Telephone Encounter (Signed)
See note  Copied from [place] [phone]. Topic: General - Other >> Sep 20, 2018 11:13 AM Windy Kalata wrote: Reason for CRM: Pt called back to talk to [facility] who was in a room with a patient and then lunch.  Call back is [phone]

## 2018-09-20 NOTE — Telephone Encounter (Signed)
Left message to return call to our office.  

## 2018-09-21 NOTE — Telephone Encounter (Signed)
Called l/m with man to have patient call office.

## 2018-09-24 ENCOUNTER — Encounter: Payer: Self-pay | Admitting: Family Medicine

## 2018-09-24 ENCOUNTER — Ambulatory Visit (INDEPENDENT_AMBULATORY_CARE_PROVIDER_SITE_OTHER): Payer: Medicare HMO | Admitting: Family Medicine

## 2018-09-24 ENCOUNTER — Telehealth: Payer: Self-pay | Admitting: Family Medicine

## 2018-09-24 VITALS — BP 120/80 | HR 80 | Temp 98.3°F | Ht 65.0 in | Wt 299.8 lb

## 2018-09-24 DIAGNOSIS — D751 Secondary polycythemia: Secondary | ICD-10-CM | POA: Diagnosis not present

## 2018-09-24 DIAGNOSIS — F17210 Nicotine dependence, cigarettes, uncomplicated: Secondary | ICD-10-CM | POA: Diagnosis not present

## 2018-09-24 DIAGNOSIS — F419 Anxiety disorder, unspecified: Secondary | ICD-10-CM | POA: Diagnosis not present

## 2018-09-24 DIAGNOSIS — J4541 Moderate persistent asthma with (acute) exacerbation: Secondary | ICD-10-CM | POA: Diagnosis not present

## 2018-09-24 MED ORDER — PROMETHAZINE-CODEINE 6.25-10 MG/5ML PO SOLN: 5.0000 mL | Freq: Three times a day (TID) | ORAL | 0 refills | Status: DC | PRN

## 2018-09-24 MED ORDER — BUPROPION HCL ER (XL) 150 MG PO TB24: 150.0000 mg | ORAL_TABLET | Freq: Every day | ORAL | 3 refills | Status: DC

## 2018-09-24 MED ORDER — ALPRAZOLAM 0.5 MG PO TABS: 0.5000 mg | ORAL_TABLET | Freq: Two times a day (BID) | ORAL | 3 refills | Status: DC | PRN

## 2018-09-24 MED ORDER — AMOXICILLIN 875 MG PO TABS: 875.0000 mg | ORAL_TABLET | Freq: Two times a day (BID) | ORAL | 0 refills | Status: DC

## 2018-09-24 MED ORDER — BUDESONIDE-FORMOTEROL FUMARATE 80-4.5 MCG/ACT IN AERO: 2.0000 | INHALATION_SPRAY | Freq: Two times a day (BID) | RESPIRATORY_TRACT | 3 refills | Status: DC

## 2018-09-24 NOTE — Telephone Encounter (Signed)
Pt seen today

## 2018-09-24 NOTE — Progress Notes (Signed)
[name] [name] is a 68 y.o. female is here for follow up.  History of Present Illness:   I,[name] [name], industrial/product as a Education administrator for Briscoe Deutscher, DO.,have documented all relevant documentation on the behalf of Briscoe Deutscher, DO,as directed by  Briscoe Deutscher, DO while in the presence of Briscoe Deutscher, DO.  HPI: From nurse triage: Pt calling stating that she feels like the Klonopin is not helping and would like to go back to taking Xanax. Pt states "all I do is cry and I don't feel like myself". Pt states she really likes christmas but she just cried during the holiday and states it is a difficult time for her due to having losses in her family. Pt denies any suicidal or homicidal ideation at this time.   Still smoking, but down to a few cigarettes daily.   Health Maintenance Due  Topic Date Due  . DEXA SCAN  06/16/2018  . PNA vac Low Risk Adult (1 of 2 - PCV13) 06/16/2018  . PAP SMEAR-Modifier  09/30/2018   Depression screen [facility] 2/9 09/24/2018 03/15/2018 12/01/2017  Decreased Interest 1 0 0  Down, Depressed, Hopeless 0 0 0  PHQ - 2 Score 1 0 0  Altered sleeping 1 0 0  Tired, decreased energy 1 0 0  Change in appetite 1 0 0  Feeling bad or failure about yourself  1 0 0  Trouble concentrating 0 0 0  Moving slowly or fidgety/restless 0 0 0  Suicidal thoughts 0 0 0  PHQ-9 Score 5 0 0  Difficult doing work/chores Not difficult at all Not difficult at all -   PMHx, SurgHx, SocialHx, FamHx, Medications, and Allergies were reviewed in the Visit Navigator and updated as appropriate.   Patient Active Problem List   Diagnosis Date Noted  . Left shoulder pain 07/21/2018  . Diabetic peripheral neuropathy associated with type 2 diabetes mellitus ([place]), on Gabapentin 04/18/2018  . Gout, intermittent, prn colchicine 04/18/2018  . Balance problem, uses cane 03/17/2018  . Hypertension associated with diabetes ([place]) 03/14/2018  . Hepatic steatosis 03/14/2018  . Polycythemia, secondary, followed by  Heme, therapeutic phlebotomy 10/15/2017  . Anxiety, uses prn Xanax 05/27/2017  . Mild obstructive sleep apnea-hypopnea syndrome but with sig desats, not on CPAP 01/28/2017  . DM (diabetes mellitus) with complications ([place]), on Metformin and Invokana   . CAD, BMS to LAD 02/2008   . Bronchitis, asthmatic   . MI, old   . Hypothyroidism (acquired), stable on Levothyroxine   . COPD GOLD 0/ prob AB still smoking  11/07/2016  . S/P coronary artery stent placement 04/16/2016  . Hypertensive heart disease 04/16/2016  . Hyperlipidemia associated with type 2 diabetes mellitus ([place]), on Lipitor 04/16/2016  . Nicotine dependence, cigarrettes, precontemplative 04/16/2016  . Morbid (severe) obesity due to excess calories ([place]) 04/16/2016   Social History   Tobacco Use  . Smoking status: Current Every Day Smoker    Packs/day: 0.50    Years: 31.00    Pack years: 15.50  . Smokeless tobacco: Never Used  Substance Use Topics  . Alcohol use: No  . Drug use: No   Current Medications and Allergies:   .  albuterol (ACCUNEB) 1.25 MG/3ML nebulizer solution, Take 3 mLs (1.25 mg total) by nebulization 3 (three) times daily as needed for wheezing., Disp: 75 mL, Rfl: 2 .  albuterol (PROAIR HFA) 108 (90 Base) MCG/ACT inhaler, Inhale 2 puffs into the lungs every 6 (six) hours as needed for wheezing or shortness of  breath., Disp: 1 Inhaler, Rfl: 1 .  ALPRAZolam (XANAX) 0.5 MG tablet, TAKE 1 TABLET BY MOUTH TWICE DAILY AS NEEDED FOR ANXIETY, Disp: 60 tablet, Rfl: 0 .  aspirin 81 MG tablet, Take 81 mg by mouth daily., Disp: , Rfl:  .  atorvastatin (LIPITOR) 10 MG tablet, TAKE 1 TABLET(10 MG) BY MOUTH DAILY, Disp: 90 tablet, Rfl: 2 .  celecoxib (CELEBREX) 200 MG capsule, TAKE 1 CAPSULE(200 MG) BY MOUTH DAILY, Disp: 30 capsule, Rfl: 3 .  clonazePAM (KLONOPIN) 0.5 MG tablet, Take 1 tablet (0.5 mg total) by mouth 2 (two) times daily as needed for anxiety., Disp: 20 tablet, Rfl: 1 .  clopidogrel (PLAVIX) 75 MG tablet,  Take 1 tablet (75 mg total) by mouth daily., Disp: 90 tablet, Rfl: 2 .  colchicine 0.6 MG tablet, Take 1 tablet (0.6 mg total) by mouth daily., Disp: 90 tablet, Rfl: 0 .  furosemide (LASIX) 20 MG tablet, Take 1 tablet (20 mg total) by mouth daily., Disp: 30 tablet, Rfl: 3 .  gabapentin (NEURONTIN) 300 MG capsule, TAKE 1 CAPSULE(300 MG) BY MOUTH THREE TIMES DAILY, Disp: 90 capsule, Rfl: 0 .  glucose blood test strip, Use as instructed, Disp: 100 each, Rfl: 12 .  INVOKANA 300 MG TABS tablet, TAKE 1 TABLET(300 MG) BY MOUTH DAILY, Disp: 90 tablet, Rfl: 1 .  ipratropium-albuterol (DUONEB) 0.5-2.5 (3) MG/3ML SOLN, USE 3 ML VIA NEBULIZER EVERY 6 HOURS AS NEEDED, Disp: 3240 mL, Rfl: 1 .  isosorbide mononitrate (IMDUR) 60 MG 24 hr tablet, Take 1 tablet (60 mg total) by mouth daily., Disp: 90 tablet, Rfl: 2 .  levothyroxine (SYNTHROID, LEVOTHROID) 112 MCG tablet, TAKE 1 TABLET(112 MCG) BY MOUTH DAILY, Disp: 90 tablet, Rfl: 0 .  metFORMIN (GLUCOPHAGE) 500 MG tablet, TAKE 1 TABLET(500 MG) BY MOUTH DAILY, Disp: 90 tablet, Rfl: 0 .  spironolactone (ALDACTONE) 25 MG tablet, TAKE 1 TABLET(25 MG) BY MOUTH DAILY, Disp: 90 tablet, Rfl: 0 .  SYMBICORT 80-4.5 MCG/ACT inhaler, INHALE 2 PUFFS BY MOUTH TWICE DAILY, Disp: 10.2 g, Rfl: 3   Allergies  Allergen Reactions  . Clindamycin/Lincomycin Other (See Comments)    Head ache   . Codeine Hives  . Dilaudid [Hydromorphone Hcl] Nausea And Vomiting  . Iodides   . Reglan [Metoclopramide] Other (See Comments)    Other reaction(s): Dystonia Hallucinations/Violent  . Reglan [Metoclopramide]   . Tramadol   . Dilaudid [Hydromorphone Hcl] Nausea And Vomiting  . Tape Rash    Blisters    Review of Systems   Pertinent items are noted in the HPI. Otherwise, a complete ROS is negative.  Vitals:   Vitals:   09/24/18 1058  BP: 120/80  Pulse: 80  Temp: 98.3 F (36.8 C)  TempSrc: Oral  SpO2: 96%  Weight: 299 lb 12.8 oz (136 kg)  Height: 5\' 5"  (1.651 m)     Body  mass index is 49.89 kg/m.  Physical Exam:   Physical Exam Vitals signs and nursing note reviewed.  Constitutional:      General: She is not in acute distress.    Appearance: She is obese.  HENT:     Head: Normocephalic and atraumatic.     Right Ear: External ear normal.     Left Ear: External ear normal.     Nose: Nose normal.     Mouth/Throat:     Mouth: Mucous membranes are moist.  Eyes:     Conjunctiva/sclera: Conjunctivae normal.     Pupils: Pupils are equal, round, and reactive  to light.  Neck:     Musculoskeletal: Normal range of motion and neck supple.  Cardiovascular:     Rate and Rhythm: Normal rate and regular rhythm.     Heart sounds: Normal heart sounds.  Pulmonary:     Effort: Pulmonary effort is normal.     Breath sounds: No wheezing, rhonchi or rales.  Abdominal:     Palpations: Abdomen is soft.  Skin:    General: Skin is warm.  Neurological:     General: No focal deficit present.     Mental Status: She is alert.  Psychiatric:        Mood and Affect: Mood normal.        Behavior: Behavior normal.        Thought Content: Thought content normal.        Judgment: Judgment normal.    Results for orders placed or performed in visit on 08/18/18  CBC with Differential/Platelet  Result Value Ref Range   WBC 8.6 4.0 - 10.5 K/uL   RBC 5.71 (H) 3.87 - 5.11 MIL/uL   Hemoglobin 18.0 (H) 12.0 - 15.0 g/dL   HCT 56.4 (H) 36.0 - 46.0 %   MCV 98.8 80.0 - 100.0 fL   MCH 31.5 26.0 - 34.0 pg   MCHC 31.9 30.0 - 36.0 g/dL   RDW 14.6 11.5 - 15.5 %   Platelets 172 150 - 400 K/uL   nRBC 0.0 0.0 - 0.2 %   Neutrophils Relative % 64 %   Neutro Abs 5.6 1.7 - 7.7 K/uL   Lymphocytes Relative 25 %   Lymphs Abs 2.2 0.7 - 4.0 K/uL   Monocytes Relative 7 %   Monocytes Absolute 0.6 0.1 - 1.0 K/uL   Eosinophils Relative 2 %   Eosinophils Absolute 0.2 0.0 - 0.5 K/uL   Basophils Relative 1 %   Basophils Absolute 0.1 0.0 - 0.1 K/uL   Immature Granulocytes 1 %   Abs Immature  Granulocytes 0.04 0.00 - 0.07 K/uL  CMP (Cancer Center only)  Result Value Ref Range   Sodium 142 135 - 145 mmol/L   Potassium 4.3 3.5 - 5.1 mmol/L   Chloride 100 98 - 111 mmol/L   CO2 30 22 - 32 mmol/L   Glucose, Bld 188 (H) 70 - 99 mg/dL   BUN 20 8 - 23 mg/dL   Creatinine 1.32 (H) 0.44 - 1.00 mg/dL   Calcium 10.1 8.9 - 10.3 mg/dL   Total Protein 7.6 6.5 - 8.1 g/dL   Albumin 3.8 3.5 - 5.0 g/dL   AST 12 (L) 15 - 41 U/L   ALT 17 0 - 44 U/L   Alkaline Phosphatase 98 38 - 126 U/L   Total Bilirubin 0.7 0.3 - 1.2 mg/dL   GFR, Est Non Af Am 42 (L) >60 mL/min   GFR, Est AFR Am 49 (L) >60 mL/min   Anion gap 12 5 - 15  JAK2 (including V617F and Exon 12), MPL, and CALR-Next Generation Sequencing  Result Value Ref Range   JAK 2, MPL, CALR See Scanned report in [place] and Plan:   Diagnoses and all orders for this visit:  Anxiety, uses prn Xanax Comments: Okay to go back to Xanax. Discussed using sparingly. Offered therapy and another medication to help with vegetative symptoms.  Orders: -     ALPRAZolam (XANAX) 0.5 MG tablet; Take 1 tablet (0.5 mg total) by mouth 2 (two) times daily as needed for anxiety. -  buPROPion (WELLBUTRIN XL) 150 MG 24 hr tablet; Take 1 tablet (150 mg total) by mouth daily.  Polycythemia, secondary, followed by Heme, therapeutic phlebotomy  Moderate persistent asthmatic bronchitis with acute exacerbation Comments: No signs of bacterial infection. Okay promethazine with codeine to use prn for sleep. Orders: -     amoxicillin (AMOXIL) 875 MG tablet; Take 1 tablet (875 mg total) by mouth 2 (two) times daily. -     Promethazine-Codeine 6.25-10 MG/5ML SOLN; Take 5 mLs by mouth every 8 (eight) hours as needed.  Cigarette nicotine dependence without complication I advised patient to quit smoking, and offered support. [place] QUITLINE: 1-800-QUIT-NOW 573-693-1038).  . Orders and follow up as documented in [place], reviewed diet, exercise  and weight control, cardiovascular risk and specific lipid/LDL goals reviewed, reviewed medications and side effects in detail.  . Reviewed expectations re: course of current medical issues. . Outlined signs and symptoms indicating need for more acute intervention. . Patient verbalized understanding and all questions were answered. . Patient received an After Visit Summary.  Briscoe Deutscher, DO [place], [place] 09/27/2018

## 2018-09-24 NOTE — Telephone Encounter (Signed)
Copied from [place] [id number]. Topic: Quick Communication - Rx Refill/Question >> Sep 24, 2018  2:37 PM Leward Quan A wrote: Medication: ipratropium (ATROVENT) 0.06 % nasal spray, SYMBICORT 80-4.5 MCG/ACT inhaler   Patient stated that she spoke with Dr at appointment about refilling nasal spray  Has the patient contacted their pharmacy? Yes.   (Agent: If no, request that the patient contact the pharmacy for the refill.) (Agent: If yes, when and what did the pharmacy advise?)  Preferred Pharmacy (with phone number or street name): [facility] [place], [place] [phone] (Phone) [phone] (Fax)    Agent: Please be advised that RX refills may take up to 3 business days. We ask that you follow-up with your pharmacy.

## 2018-09-27 ENCOUNTER — Encounter: Payer: Self-pay | Admitting: Family Medicine

## 2018-09-29 DIAGNOSIS — M25512 Pain in left shoulder: Secondary | ICD-10-CM | POA: Diagnosis not present

## 2018-09-30 ENCOUNTER — Other Ambulatory Visit: Payer: Self-pay | Admitting: Family Medicine

## 2018-09-30 DIAGNOSIS — J4541 Moderate persistent asthma with (acute) exacerbation: Secondary | ICD-10-CM

## 2018-09-30 NOTE — Telephone Encounter (Signed)
Ok to fill 

## 2018-09-30 NOTE — Telephone Encounter (Signed)
See note

## 2018-09-30 NOTE — Telephone Encounter (Signed)
Requested medication (s) are due for refill today - short term medication  Requested medication (s) are on the active medication list -yes  Future visit scheduled -yes  Last refill: 09/24/18  Notes to clinic: Patient is requesting a refill of medication for cough- not assigned to protocol- sent for provider review  Requested Prescriptions  Pending Prescriptions Disp Refills   Promethazine-Codeine 6.25-10 MG/5ML SOLN 120 mL 0    Sig: Take 5 mLs by mouth every 8 (eight) hours as needed.     Off-Protocol Failed - 09/30/2018  2:38 PM      Failed - Medication not assigned to a protocol, review manually.      Passed - Valid encounter within last 12 months    Recent Outpatient Visits          6 days ago Anxiety, uses prn Xanax   [place] Wallace, [place], DO   2 months ago DM (diabetes mellitus) with complications ([place]), on Metformin and [place] Wallace, [place], DO   5 months ago Hypertension associated with diabetes [facility])   [place] Wallace, [place], DO   6 months ago Diabetes mellitus without complication [facility])   [place] Wallace, [place], DO   8 months ago Moderate persistent asthmatic bronchitis with acute exacerbation   [place] Wallace, [place], DO      Future Appointments            In 2 weeks Briscoe Deutscher, DO [facility] [place], [facility]            Requested Prescriptions  Pending Prescriptions Disp Refills   Promethazine-Codeine 6.25-10 MG/5ML SOLN 120 mL 0    Sig: Take 5 mLs by mouth every 8 (eight) hours as needed.     Off-Protocol Failed - 09/30/2018  2:38 PM      Failed - Medication not assigned to a protocol, review manually.      Passed - Valid encounter within last 12 months    Recent Outpatient Visits          6 days ago Anxiety, uses prn Xanax   [place] Wallace, [place], DO   2  months ago DM (diabetes mellitus) with complications ([place]), on Metformin and [place] Wallace, [place], DO   5 months ago Hypertension associated with diabetes [facility])   [place] Wallace, [place], DO   6 months ago Diabetes mellitus without complication [facility])   [place] Wallace, [place], DO   8 months ago Moderate persistent asthmatic bronchitis with acute exacerbation   [facility] [place] Pour, [place], DO      Future Appointments            In 2 weeks Briscoe Deutscher, [place], [facility]

## 2018-09-30 NOTE — Telephone Encounter (Signed)
Patient saw Dr. Juleen China on 09/24/18 and called to say that she was supposed to order Atrovent nasal spray. No mention in progress notes.

## 2018-09-30 NOTE — Telephone Encounter (Signed)
Copied from [place] [id number]. Topic: Quick Communication - Rx Refill/Question >> Sep 30, 2018  2:34 PM [name] [name] wrote: Medication: Promethazine-Codeine 6.25-10 MG/5ML SOLN  Patient is requesting a refill of this medication stating she is still coughing, mainly at night.    Preferred Pharmacy (with phone number or street name):[id number] [place], [place]  [phone] (Phone) [phone] (Fax)

## 2018-09-30 NOTE — Telephone Encounter (Signed)
Patient calling to check status of ipratropium (ATROVENT) 0.06 % nasal spray refill. Please advise.

## 2018-10-01 MED ORDER — PROMETHAZINE-CODEINE 6.25-10 MG/5ML PO SOLN: 5.0000 mL | Freq: Three times a day (TID) | ORAL | 0 refills | Status: DC | PRN

## 2018-10-06 ENCOUNTER — Telehealth: Payer: Self-pay

## 2018-10-06 NOTE — Telephone Encounter (Signed)
Donella Obriant (Key: [facility])   This request has received an Unfavorable outcome. An eAppeal may be available. View the bottom of the request to see if an eAppeal is available along with any additional information provided by [facility].

## 2018-10-06 NOTE — Telephone Encounter (Signed)
Promethazine-Codeine syrup, 58mL q8h prn, qty 120 Ml  BIN [id number] PCN [id number] RxGrp [id number] Member ID [id number]  Initiated via [email or web address]  Katelyne Holstrom (Key: [facility])   Your information has been sent to [facility].

## 2018-10-07 NOTE — Telephone Encounter (Signed)
FYI

## 2018-10-07 NOTE — Telephone Encounter (Signed)
Okay to let the patient know.

## 2018-10-17 NOTE — Progress Notes (Signed)
[name] [name] is a 68 y.o. female is here for follow up.  History of Present Illness:   I, Gwenyth Ober, CMA serve as Education administrator for [employer].   HPI: Pt started a new medication (valtaeren), replacing gabapentin and Celebrex. Other medication compliance: compliant all of the time, diabetic diet compliance: compliant all of the time, home glucose monitoring: is performed sporadically, further diabetic ROS: no polyuria or polydipsia, no chest pain, dyspnea or TIA's, no numbness, tingling or pain in extremities.   Health Maintenance Due  Topic Date Due  . DEXA SCAN  06/16/2018  . PNA vac Low Risk Adult (1 of 2 - PCV13) 06/16/2018  . PAP SMEAR-Modifier  09/30/2018  . OPHTHALMOLOGY EXAM  10/02/2018   Depression screen [facility] 2/9 10/18/2018 10/18/2018 09/24/2018  Decreased Interest 0 0 1  Down, Depressed, Hopeless 1 1 0  PHQ - 2 Score 1 1 1   Altered sleeping 0 0 1  Tired, decreased energy 0 0 1  Change in appetite 1 1 1   Feeling bad or failure about yourself  0 0 1  Trouble concentrating 0 0 0  Moving slowly or fidgety/restless 0 0 0  Suicidal thoughts 0 0 0  PHQ-9 Score 2 2 5   Difficult doing work/chores Not difficult at all Not difficult at all Not difficult at all   PMHx, SurgHx, SocialHx, FamHx, Medications, and Allergies were reviewed in the Visit Navigator and updated as appropriate.   Patient Active Problem List   Diagnosis Date Noted  . Rosacea 10/21/2018  . Bilateral lower extremity edema 10/21/2018  . Left shoulder pain 07/21/2018  . Diabetic peripheral neuropathy associated with type 2 diabetes mellitus ([place]), on Gabapentin 04/18/2018  . Gout, intermittent, prn colchicine 04/18/2018  . Balance problem, uses cane 03/17/2018  . Hypertension associated with diabetes ([place]) 03/14/2018  . Hepatic steatosis 03/14/2018  . Polycythemia, secondary, followed by Heme, therapeutic phlebotomy 10/15/2017  . Anxiety, uses prn Xanax 05/27/2017  . Mild obstructive sleep apnea-hypopnea  syndrome but with sig desats, not on CPAP 01/28/2017  . DM (diabetes mellitus) with complications ([place]), on Metformin and Invokana   . CAD, BMS to LAD 02/2008   . Bronchitis, asthmatic   . MI, old   . Hypothyroidism (acquired), stable on Levothyroxine   . COPD GOLD 0/ prob AB still smoking  11/07/2016  . S/P coronary artery stent placement 04/16/2016  . Hypertensive heart disease 04/16/2016  . Hyperlipidemia associated with type 2 diabetes mellitus ([place]), on Lipitor 04/16/2016  . Nicotine dependence, cigarrettes, precontemplative 04/16/2016  . Morbid (severe) obesity due to excess calories ([place]) 04/16/2016   Social History   Tobacco Use  . Smoking status: Current Every Day Smoker    Packs/day: 0.50    Years: 31.00    Pack years: 15.50  . Smokeless tobacco: Never Used  Substance Use Topics  . Alcohol use: No  . Drug use: No   Current Medications and Allergies   Current Outpatient Medications:  .  albuterol (ACCUNEB) 1.25 MG/3ML nebulizer solution, Take 3 mLs (1.25 mg total) by nebulization 3 (three) times daily as needed for wheezing., Disp: 75 mL, Rfl: 2 .  albuterol (PROAIR HFA) 108 (90 Base) MCG/ACT inhaler, Inhale 2 puffs into the lungs every 6 (six) hours as needed for wheezing or shortness of breath., Disp: 1 Inhaler, Rfl: 11 .  ALPRAZolam (XANAX) 0.5 MG tablet, Take 1 tablet (0.5 mg total) by mouth 2 (two) times daily as needed for anxiety., Disp: 60 tablet, Rfl: 3 .  aspirin 81 MG tablet, Take 81 mg by mouth daily., Disp: , Rfl:  .  atorvastatin (LIPITOR) 10 MG tablet, TAKE 1 TABLET(10 MG) BY MOUTH DAILY, Disp: 90 tablet, Rfl: 2 .  budesonide-formoterol (SYMBICORT) 80-4.5 MCG/ACT inhaler, Inhale 2 puffs into the lungs 2 (two) times daily., Disp: 10.2 g, Rfl: 3 .  clonazePAM (KLONOPIN) 0.5 MG tablet, Take 1 tablet (0.5 mg total) by mouth 2 (two) times daily as needed for anxiety., Disp: 20 tablet, Rfl: 1 .  clopidogrel (PLAVIX) 75 MG tablet, Take 1 tablet (75 mg total) by  mouth daily., Disp: 90 tablet, Rfl: 2 .  colchicine 0.6 MG tablet, Take 1 tablet (0.6 mg total) by mouth daily., Disp: 90 tablet, Rfl: 0 .  diclofenac (VOLTAREN) 75 MG EC tablet, TK 1 T PO  BID WITH FOOD PRN, Disp: , Rfl:  .  furosemide (LASIX) 20 MG tablet, Take 1 tablet (20 mg total) by mouth daily., Disp: 30 tablet, Rfl: 3 .  glucose blood test strip, Use as instructed, Disp: 100 each, Rfl: 12 .  INVOKANA 300 MG TABS tablet, TAKE 1 TABLET(300 MG) BY MOUTH DAILY, Disp: 90 tablet, Rfl: 1 .  ipratropium-albuterol (DUONEB) 0.5-2.5 (3) MG/3ML SOLN, USE 3 ML VIA NEBULIZER EVERY 6 HOURS AS NEEDED, Disp: 3240 mL, Rfl: 1 .  isosorbide mononitrate (IMDUR) 60 MG 24 hr tablet, Take 1 tablet (60 mg total) by mouth daily., Disp: 90 tablet, Rfl: 2 .  levothyroxine (SYNTHROID, LEVOTHROID) 112 MCG tablet, TAKE 1 TABLET(112 MCG) BY MOUTH DAILY, Disp: 90 tablet, Rfl: 0 .  metFORMIN (GLUCOPHAGE) 500 MG tablet, TAKE 1 TABLET(500 MG) BY MOUTH DAILY, Disp: 90 tablet, Rfl: 0 .  Promethazine-Codeine 6.25-10 MG/5ML SOLN, Take 5 mLs by mouth every 8 (eight) hours as needed., Disp: 120 mL, Rfl: 0 .  spironolactone (ALDACTONE) 25 MG tablet, TAKE 1 TABLET(25 MG) BY MOUTH DAILY, Disp: 90 tablet, Rfl: 1 .  ipratropium (ATROVENT) 0.06 % nasal spray, Place 2 sprays into both nostrils 4 (four) times daily., Disp: 15 mL, Rfl: 11   Allergies  Allergen Reactions  . Clindamycin/Lincomycin Other (See Comments)    Head ache   . Codeine Hives  . Dilaudid [Hydromorphone Hcl] Nausea And Vomiting  . Iodides   . Reglan [Metoclopramide] Other (See Comments)    Other reaction(s): Dystonia Hallucinations/Violent  . Reglan [Metoclopramide]   . Tramadol   . Dilaudid [Hydromorphone Hcl] Nausea And Vomiting  . Tape Rash    Blisters    Review of Systems   Pertinent items are noted in the HPI. Otherwise, a complete ROS is negative.  Vitals   Vitals:   10/18/18 1103  BP: 118/72  Pulse: 86  Temp: 98.2 F (36.8 C)  TempSrc:  Oral  SpO2: 96%  Weight: 291 lb 6.4 oz (132.2 kg)  Height: 5\' 5"  (1.651 m)     Body mass index is 48.49 kg/m.  Physical Exam   Physical Exam Vitals signs and nursing note reviewed.  Constitutional:      General: She is not in acute distress.    Appearance: She is obese.  HENT:     Head: Normocephalic and atraumatic.     Right Ear: External ear normal.     Left Ear: External ear normal.     Nose: Nose normal.     Mouth/Throat:     Mouth: Mucous membranes are moist.  Eyes:     Conjunctiva/sclera: Conjunctivae normal.     Pupils: Pupils are equal, round, and reactive to  light.  Neck:     Musculoskeletal: Normal range of motion and neck supple.  Cardiovascular:     Rate and Rhythm: Normal rate and regular rhythm.     Heart sounds: Normal heart sounds.  Pulmonary:     Effort: Pulmonary effort is normal. No accessory muscle usage.     Breath sounds: Decreased air movement present. No wheezing, rhonchi or rales.  Abdominal:     Palpations: Abdomen is soft.  Skin:    General: Skin is warm.     Comments: Skin color, red, especially face. Rosacea nose.   Neurological:     General: No focal deficit present.     Mental Status: She is alert.  Psychiatric:        Mood and Affect: Mood normal.        Behavior: Behavior normal.        Thought Content: Thought content normal.        Judgment: Judgment normal.    Assessment and Plan   Myrtha was seen today for follow-up. Orders and follow up as documented in EMR, repeat labs ordered prior to next appointment, reviewed compliance with lifestyle measures, reviewed diet, exercise and weight control, very strongly urged to quit smoking, reviewed medications and side effects in detail.  Diagnoses and all orders for this visit:  Anxiety, uses prn Xanax  Hyperlipidemia associated with type 2 diabetes mellitus ([place]), on Lipitor -     Comprehensive metabolic panel; Future -     Lipid panel; Future  Hypertension associated with diabetes  ([place])  Hypothyroidism (acquired), stable on Levothyroxine  Moderate persistent asthmatic bronchitis with acute exacerbation Comments: No signs of bacterial infection. Okay promethazine with codeine to use prn for sleep. Orders: -     albuterol (PROAIR HFA) 108 (90 Base) MCG/ACT inhaler; Inhale 2 puffs into the lungs every 6 (six) hours as needed for wheezing or shortness of breath. -     Promethazine-Codeine 6.25-10 MG/5ML SOLN; Take 5 mLs by mouth every 8 (eight) hours as needed. -     ipratropium (ATROVENT) 0.06 % nasal spray; Place 2 sprays into both nostrils 4 (four) times daily.  Bilateral lower extremity edema -     spironolactone (ALDACTONE) 25 MG tablet; TAKE 1 TABLET(25 MG) BY MOUTH DAILY  Rosacea -     Ambulatory referral to Dermatology  Polycythemia, secondary, followed by Heme, therapeutic phlebotomy  Morbid (severe) obesity due to excess calories ([facility])  Cigarette nicotine dependence without complication  Polyneuropathy associated with underlying disease ([place]) -     Vitamin B12; Future  Chronic left shoulder pain  DM (diabetes mellitus) with complications ([place]), on Metformin and Invokana -     Hemoglobin A1c; Future   . Orders and follow up as documented in [place], reviewed diet, exercise and weight control, cardiovascular risk and specific lipid/LDL goals reviewed, reviewed medications and side effects in detail.  . Reviewed expectations re: course of current medical issues. . Outlined signs and symptoms indicating need for more acute intervention. . Patient verbalized understanding and all questions were answered. . Patient received an After Visit Summary.  CMA served as Education administrator during this visit. History, Physical, and Plan performed by medical provider. The above documentation has been reviewed and is accurate and complete. Briscoe Deutscher, D.O.  Briscoe Deutscher, DO [place], [facility] 10/21/2018

## 2018-10-18 ENCOUNTER — Ambulatory Visit (INDEPENDENT_AMBULATORY_CARE_PROVIDER_SITE_OTHER): Payer: Medicare HMO | Admitting: Family Medicine

## 2018-10-18 ENCOUNTER — Encounter: Payer: Self-pay | Admitting: Family Medicine

## 2018-10-18 VITALS — BP 118/72 | HR 86 | Temp 98.2°F | Ht 65.0 in | Wt 291.4 lb

## 2018-10-18 DIAGNOSIS — G8929 Other chronic pain: Secondary | ICD-10-CM

## 2018-10-18 DIAGNOSIS — E1159 Type 2 diabetes mellitus with other circulatory complications: Secondary | ICD-10-CM

## 2018-10-18 DIAGNOSIS — J4541 Moderate persistent asthma with (acute) exacerbation: Secondary | ICD-10-CM | POA: Diagnosis not present

## 2018-10-18 DIAGNOSIS — E039 Hypothyroidism, unspecified: Secondary | ICD-10-CM | POA: Diagnosis not present

## 2018-10-18 DIAGNOSIS — F419 Anxiety disorder, unspecified: Secondary | ICD-10-CM | POA: Diagnosis not present

## 2018-10-18 DIAGNOSIS — E1169 Type 2 diabetes mellitus with other specified complication: Secondary | ICD-10-CM | POA: Diagnosis not present

## 2018-10-18 DIAGNOSIS — D751 Secondary polycythemia: Secondary | ICD-10-CM

## 2018-10-18 DIAGNOSIS — R600 Localized edema: Secondary | ICD-10-CM | POA: Diagnosis not present

## 2018-10-18 DIAGNOSIS — M25512 Pain in left shoulder: Secondary | ICD-10-CM

## 2018-10-18 DIAGNOSIS — I152 Hypertension secondary to endocrine disorders: Secondary | ICD-10-CM

## 2018-10-18 DIAGNOSIS — E118 Type 2 diabetes mellitus with unspecified complications: Secondary | ICD-10-CM

## 2018-10-18 DIAGNOSIS — G63 Polyneuropathy in diseases classified elsewhere: Secondary | ICD-10-CM

## 2018-10-18 DIAGNOSIS — E785 Hyperlipidemia, unspecified: Secondary | ICD-10-CM

## 2018-10-18 DIAGNOSIS — L719 Rosacea, unspecified: Secondary | ICD-10-CM | POA: Diagnosis not present

## 2018-10-18 DIAGNOSIS — F17210 Nicotine dependence, cigarettes, uncomplicated: Secondary | ICD-10-CM

## 2018-10-18 DIAGNOSIS — I10 Essential (primary) hypertension: Secondary | ICD-10-CM

## 2018-10-18 MED ORDER — PROMETHAZINE-CODEINE 6.25-10 MG/5ML PO SOLN: 5.0000 mL | Freq: Three times a day (TID) | ORAL | 0 refills | Status: DC | PRN

## 2018-10-18 MED ORDER — IPRATROPIUM BROMIDE 0.06 % NA SOLN: 2.0000 | Freq: Four times a day (QID) | NASAL | 11 refills | Status: DC

## 2018-10-18 MED ORDER — SPIRONOLACTONE 25 MG PO TABS: ORAL_TABLET | ORAL | 1 refills | Status: DC

## 2018-10-18 MED ORDER — ALBUTEROL SULFATE HFA 108 (90 BASE) MCG/ACT IN AERS: 2.0000 | INHALATION_SPRAY | Freq: Four times a day (QID) | RESPIRATORY_TRACT | 11 refills | Status: DC | PRN

## 2018-10-18 NOTE — Progress Notes (Signed)
HEMATOLOGY/ONCOLOGY CLINIC NOTE  Date of Service: 10/19/2018  Patient Care Team: Briscoe Deutscher, DO as PCP - General (Family Medicine) Skeet Latch, MD as Attending Physician (Cardiology) Ardath Sax, MD as Consulting Physician (Hematology and Oncology) Almedia Balls, MD as Consulting Physician (Orthopedic Surgery)  CHIEF COMPLAINTS/PURPOSE OF CONSULTATION:  Polycythemia   HISTORY OF PRESENTING ILLNESS:  [name] [name] is a wonderful 68 y.o. female who has been previously seen by my colleague [name] for evaluation and management of Polycythemia, thought to be secondary. She last saw [name] in March 2019. The pt reports that she is doing well overall.   The pt reports that she has not been using a CPAP machine because she feels claustrophobic. She notes that she is smoking a half pack of cigarettes per day at this time.  The pt notes that her DM has been well controlled overall, but was 242 this morning which she notes is not the norm for her. She notes that she tries to stay well hydrated. She is currently taking Invokana, in addition to a fluid pill. The pt denies any current leg swelling, and also denies itching and skin rashes. She endorses stable energy levels.   The pt denies any history of blood clots.   Most recent lab results (07/19/18) of CBC w/diff and CMP is as follows: all values are WNL except for RBC at 5.60, Hgb at 18.0, HCT at 54.2, RDW at 16.0, PLT at 146k, Glucose at 166, Creatinine at 1.23.  On review of systems, pt reports stable energy levels, staying hydrated, urinating frequently, and denies leg swelling, skin rashes, itching, abdominal pains, and any other symptoms.   Interval History:   [name] [name] returns today for management and evaluation of her Polycythemia. The patient's last visit with [place] was on 08/18/18. She is accompanied today by her partner. The pt reports that she is doing well overall.   The pt reports that she has not developed  any new concerns in the interim. The pt notes that her last sleep study was one year ago, and did not reveal overt concerns for sleep apnea, however consulting medical records indicates that sleep apnea was found. The pt has not been using a CPAP.   The pt notes that she has decreased her smoking. She has intentionally lost 10 pounds through diet and exercise.   Lab results today (10/19/18) of CBC w/diff and CMP is as follows: all values are WNL except for RBC at 5.54, HGB at 17.5, HCT at 52.6, Glucose at 146, BUN at 24, Creatinine at 1.27 AST at 14, GFR at 44.  On review of systems, pt reports intentional weight loss, stable energy levels, eating well, and denies leg swelling, and any other symptoms.    MEDICAL HISTORY:  Past Medical History:  Diagnosis Date  . Bronchitis, asthmatic   . Coronary artery disease    BMS to LAD 02/2008  . Diabetes mellitus without complication ([place])   . Hyperlipidemia   . Hypertensive heart disease 04/16/2016  . Hypothyroidism   . MI, old   . Morbid obesity ([place]) 04/16/2016  . S/P coronary artery stent placement 04/16/2016   BMS to LAD 2009  . Tobacco abuse 04/16/2016    SURGICAL HISTORY: Past Surgical History:  Procedure Laterality Date  . ABDOMINAL HYSTERECTOMY    . ANKLE SURGERY Right   . CHOLECYSTECTOMY    . CORONARY ANGIOPLASTY  02/2008   bare metal stent to LAD   . CORONARY  STENT PLACEMENT    . KNEE ARTHROSCOPY    . ROTATOR CUFF REPAIR Bilateral   . THORACIC OUTLET SURGERY    . WRIST SURGERY      SOCIAL HISTORY: Social History   Socioeconomic History  . Marital status: Married    Spouse name: Not on file  . Number of children: Not on file  . Years of education: Not on file  . Highest education level: Not on file  Occupational History  . Not on file  Social Needs  . Financial resource strain: Not on file  . Food insecurity:    Worry: Not on file    Inability: Not on file  . Transportation needs:    Medical: Not on file     Non-medical: Not on file  Tobacco Use  . Smoking status: Current Every Day Smoker    Packs/day: 0.50    Years: 31.00    Pack years: 15.50  . Smokeless tobacco: Never Used  Substance and Sexual Activity  . Alcohol use: No  . Drug use: No  . Sexual activity: Yes    Partners: Male  Lifestyle  . Physical activity:    Days per week: Not on file    Minutes per session: Not on file  . Stress: Not on file  Relationships  . Social connections:    Talks on phone: Not on file    Gets together: Not on file    Attends religious service: Not on file    Active member of club or organization: Not on file    Attends meetings of clubs or organizations: Not on file    Relationship status: Not on file  . Intimate partner violence:    Fear of current or ex partner: Not on file    Emotionally abused: Not on file    Physically abused: Not on file    Forced sexual activity: Not on file  Other Topics Concern  . Not on file  Social History Narrative        FAMILY HISTORY: Family History  Problem Relation Age of Onset  . Cancer Mother   . Diabetes Son   . Hypertension Son     ALLERGIES:  is allergic to clindamycin/lincomycin; codeine; dilaudid [hydromorphone hcl]; iodides; reglan [metoclopramide]; reglan [metoclopramide]; tramadol; dilaudid [hydromorphone hcl]; and tape.  MEDICATIONS:  Current Outpatient Medications  Medication Sig Dispense Refill  . albuterol (ACCUNEB) 1.25 MG/3ML nebulizer solution Take 3 mLs (1.25 mg total) by nebulization 3 (three) times daily as needed for wheezing. 75 mL 2  . albuterol (PROAIR HFA) 108 (90 Base) MCG/ACT inhaler Inhale 2 puffs into the lungs every 6 (six) hours as needed for wheezing or shortness of breath. 1 Inhaler 11  . ALPRAZolam (XANAX) 0.5 MG tablet Take 1 tablet (0.5 mg total) by mouth 2 (two) times daily as needed for anxiety. 60 tablet 3  . aspirin 81 MG tablet Take 81 mg by mouth daily.    Marland Kitchen atorvastatin (LIPITOR) 10 MG tablet TAKE 1  TABLET(10 MG) BY MOUTH DAILY 90 tablet 2  . budesonide-formoterol (SYMBICORT) 80-4.5 MCG/ACT inhaler Inhale 2 puffs into the lungs 2 (two) times daily. 10.2 g 3  . clonazePAM (KLONOPIN) 0.5 MG tablet Take 1 tablet (0.5 mg total) by mouth 2 (two) times daily as needed for anxiety. 20 tablet 1  . clopidogrel (PLAVIX) 75 MG tablet Take 1 tablet (75 mg total) by mouth daily. 90 tablet 2  . colchicine 0.6 MG tablet Take 1 tablet (0.6 mg  total) by mouth daily. 90 tablet 0  . diclofenac (VOLTAREN) 75 MG EC tablet TK 1 T PO  BID WITH FOOD PRN    . furosemide (LASIX) 20 MG tablet Take 1 tablet (20 mg total) by mouth daily. 30 tablet 3  . glucose blood test strip Use as instructed 100 each 12  . INVOKANA 300 MG TABS tablet TAKE 1 TABLET(300 MG) BY MOUTH DAILY 90 tablet 1  . ipratropium (ATROVENT) 0.06 % nasal spray Place 2 sprays into both nostrils 4 (four) times daily. 15 mL 11  . ipratropium-albuterol (DUONEB) 0.5-2.5 (3) MG/3ML SOLN USE 3 ML VIA NEBULIZER EVERY 6 HOURS AS NEEDED 3240 mL 1  . isosorbide mononitrate (IMDUR) 60 MG 24 hr tablet Take 1 tablet (60 mg total) by mouth daily. 90 tablet 2  . levothyroxine (SYNTHROID, LEVOTHROID) 112 MCG tablet TAKE 1 TABLET(112 MCG) BY MOUTH DAILY 90 tablet 0  . metFORMIN (GLUCOPHAGE) 500 MG tablet TAKE 1 TABLET(500 MG) BY MOUTH DAILY 90 tablet 0  . Promethazine-Codeine 6.25-10 MG/5ML SOLN Take 5 mLs by mouth every 8 (eight) hours as needed. 120 mL 0  . spironolactone (ALDACTONE) 25 MG tablet TAKE 1 TABLET(25 MG) BY MOUTH DAILY 90 tablet 1   No current facility-administered medications for this visit.     REVIEW OF SYSTEMS:    A 10+ POINT REVIEW OF SYSTEMS WAS OBTAINED including neurology, dermatology, psychiatry, cardiac, respiratory, lymph, extremities, GI, GU, Musculoskeletal, constitutional, breasts, reproductive, HEENT.  All pertinent positives are noted in the HPI.  All others are negative.   PHYSICAL EXAMINATION:  . Vitals:   10/19/18 0959  BP:  103/70  Pulse: 70  Resp: 18  Temp: 98 F (36.7 C)  SpO2: 94%   Filed Weights   10/19/18 0959  Weight: 292 lb 11.2 oz (132.8 kg)   .Body mass index is 48.71 kg/m.  GENERAL:alert, in no acute distress and comfortable SKIN: no acute rashes, no significant lesions EYES: conjunctiva are pink and non-injected, sclera anicteric OROPHARYNX: MMM, no exudates, no oropharyngeal erythema or ulceration NECK: supple, no JVD LYMPH:  no palpable lymphadenopathy in the cervical, axillary or inguinal regions LUNGS: clear to auscultation b/l with normal respiratory effort HEART: regular rate & rhythm ABDOMEN:  normoactive bowel sounds , non tender, not distended. No palpable hepatosplenomegaly.  Extremity: no pedal edema PSYCH: alert & oriented x 3 with fluent speech NEURO: no focal motor/sensory deficits   LABORATORY DATA:  I have reviewed the data as listed  . CBC Latest Ref Rng & Units 10/19/2018 08/18/2018 07/19/2018  WBC 4.0 - 10.5 K/uL 7.8 8.6 6.4  Hemoglobin 12.0 - 15.0 g/dL 17.5(H) 18.0(H) 18.0 Repeated and verified X2.(HH)  Hematocrit 36.0 - 46.0 % 52.6(H) 56.4(H) 54.2(H)  Platelets 150 - 400 K/uL 154 172 146.0(L)    . CMP Latest Ref Rng & Units 10/19/2018 08/18/2018 07/19/2018  Glucose 70 - 99 mg/dL 146(H) 188(H) 166(H)  BUN 8 - 23 mg/dL 24(H) 20 20  Creatinine 0.44 - 1.00 mg/dL 1.27(H) 1.32(H) 1.23(H)  Sodium 135 - 145 mmol/L 140 142 139  Potassium 3.5 - 5.1 mmol/L 3.7 4.3 4.0  Chloride 98 - 111 mmol/L 104 100 101  CO2 22 - 32 mmol/L 25 30 29   Calcium 8.9 - 10.3 mg/dL 10.1 10.1 9.7  Total Protein 6.5 - 8.1 g/dL 7.2 7.6 7.2  Total Bilirubin 0.3 - 1.2 mg/dL 0.8 0.7 0.9  Alkaline Phos 38 - 126 U/L 85 98 76  AST 15 - 41 U/L 14(L) 12(L)  13  ALT 0 - 44 U/L 22 17 13     08/18/18 Pathology:    RADIOGRAPHIC STUDIES: I have personally reviewed the radiological images as listed and agreed with the findings in the report. No results found.  ASSESSMENT & PLAN:  68 y.o. female  with  1. Polycythemia Previously under the care of another oncologist   PLAN: -Discussed pt labwork today, 10/19/18; HGB slightly improved to 17.5, HCT at 52.6, chemistries are stable -Discussed the 08/18/18 Molecular pathology report which did not observe a JAK2, MPL, or CALR mutaitons -Discussed that the molecular pathology rules out Polycythemia vera, which is helpful to know as it changes the risk of blood clots, treatment choices, and possibility of progressing to an acute leukemia -Recommend controlling DM, though hemoconcentration is possible with Invokana + Diuretics -Counseled pt towards complete smoking cessation as this will also have a bearing upon her secondary polycythemia  -Stay well hydrated  -Recommend using a CPAP as her last sleep study from April 2018 did reveal sleep apnea -Discussed that untreated sleep apnea and smoking cigarettes can be causes of secondary polycythemia and counseled pt towards smoking cessation  -Also discussed that Invokana and Lasix can cause the appearance of polycythemia due to less water in the blood -Will see the pt back as needed -no indication for therapeutic phlebotomy for her secondary polycythemia at this time.  F/u with Dr Briscoe Deutscher for continued cares. No indication for hematology followup.   All of the patients questions were answered with apparent satisfaction. The patient knows to call the clinic with any problems, questions or concerns.  The total time spent in the appt was 20 minutes and more than 50% was on counseling and direct patient cares.    Sullivan Lone MD MS AAHIVMS [facility] Hematology/Oncology Physician [facility]  (Office):       [phone] (Work cell):  [phone] (Fax):           [phone]  10/19/2018 10:45 AM  I, Baldwin [place], am acting as a scribe for Dr. Sullivan Lone.   .I have reviewed the above documentation for accuracy and completeness, and I agree with the above. Brunetta Genera MD

## 2018-10-19 ENCOUNTER — Inpatient Hospital Stay: Payer: Medicare HMO | Admitting: Hematology

## 2018-10-19 ENCOUNTER — Inpatient Hospital Stay: Payer: Medicare HMO

## 2018-10-19 ENCOUNTER — Inpatient Hospital Stay: Payer: Medicare HMO | Attending: Hematology

## 2018-10-19 VITALS — BP 103/70 | HR 70 | Temp 98.0°F | Resp 18 | Ht 65.0 in | Wt 292.7 lb

## 2018-10-19 DIAGNOSIS — G4730 Sleep apnea, unspecified: Secondary | ICD-10-CM

## 2018-10-19 DIAGNOSIS — Z720 Tobacco use: Secondary | ICD-10-CM | POA: Insufficient documentation

## 2018-10-19 DIAGNOSIS — D751 Secondary polycythemia: Secondary | ICD-10-CM

## 2018-10-19 LAB — CBC WITH DIFFERENTIAL (CANCER CENTER ONLY)
Abs Immature Granulocytes: 0.02 10*3/uL (ref 0.00–0.07)
Basophils Absolute: 0 10*3/uL (ref 0.0–0.1)
Basophils Relative: 1 %
EOS ABS: 0.2 10*3/uL (ref 0.0–0.5)
Eosinophils Relative: 2 %
HEMATOCRIT: 52.6 % — AB (ref 36.0–46.0)
Hemoglobin: 17.5 g/dL — ABNORMAL HIGH (ref 12.0–15.0)
Immature Granulocytes: 0 %
LYMPHS ABS: 2.6 10*3/uL (ref 0.7–4.0)
Lymphocytes Relative: 34 %
MCH: 31.6 pg (ref 26.0–34.0)
MCHC: 33.3 g/dL (ref 30.0–36.0)
MCV: 94.9 fL (ref 80.0–100.0)
MONOS PCT: 8 %
Monocytes Absolute: 0.6 10*3/uL (ref 0.1–1.0)
Neutro Abs: 4.3 10*3/uL (ref 1.7–7.7)
Neutrophils Relative %: 55 %
PLATELETS: 154 10*3/uL (ref 150–400)
RBC: 5.54 MIL/uL — ABNORMAL HIGH (ref 3.87–5.11)
RDW: 13.9 % (ref 11.5–15.5)
WBC Count: 7.8 10*3/uL (ref 4.0–10.5)
nRBC: 0 % (ref 0.0–0.2)

## 2018-10-19 LAB — CMP (CANCER CENTER ONLY)
ALT: 22 U/L (ref 0–44)
AST: 14 U/L — ABNORMAL LOW (ref 15–41)
Albumin: 3.7 g/dL (ref 3.5–5.0)
Alkaline Phosphatase: 85 U/L (ref 38–126)
Anion gap: 11 (ref 5–15)
BUN: 24 mg/dL — ABNORMAL HIGH (ref 8–23)
CALCIUM: 10.1 mg/dL (ref 8.9–10.3)
CO2: 25 mmol/L (ref 22–32)
Chloride: 104 mmol/L (ref 98–111)
Creatinine: 1.27 mg/dL — ABNORMAL HIGH (ref 0.44–1.00)
GFR, Est AFR Am: 51 mL/min — ABNORMAL LOW (ref >60)
GFR, Estimated: 44 mL/min — ABNORMAL LOW (ref >60)
Glucose, Bld: 146 mg/dL — ABNORMAL HIGH (ref 70–99)
Potassium: 3.7 mmol/L (ref 3.5–5.1)
Sodium: 140 mmol/L (ref 135–145)
Total Bilirubin: 0.8 mg/dL (ref 0.3–1.2)
Total Protein: 7.2 g/dL (ref 6.5–8.1)

## 2018-10-19 NOTE — Patient Instructions (Signed)
Thank you for choosing [facility] to provide your oncology and hematology care.  To afford each patient quality time with our providers, please arrive 30 minutes before your scheduled appointment time.  If you arrive late for your appointment, you may be asked to reschedule.  We strive to give you quality time with our providers, and arriving late affects you and other patients whose appointments are after yours.    If you are a no show for multiple scheduled visits, you may be dismissed from the clinic at the providers discretion.     Again, thank you for choosing [facility], our hope is that these requests will decrease the amount of time that you wait before being seen by our physicians.  ______________________________________________________________________   Should you have questions after your visit to the [facility], please contact our office at [phone] between the hours of 8:30 and 4:30 p.m.    Voicemails left after 4:30p.m will not be returned until the following business day.     For prescription refill requests, please have your pharmacy contact us directly.  Please also try to allow 48 hours for prescription requests.     Please contact the scheduling department for questions regarding scheduling.  For scheduling of procedures such as PET scans, CT scans, MRI, Ultrasound, etc please contact central scheduling at [phone].     Resources For Cancer Patients and Caregivers:    Oncolink.org:  A wonderful resource for patients and healthcare providers for information regarding your disease, ways to tract your treatment, what to expect, etc.      American Cancer Society:  800-227-2345  Can help patients locate various types of support and financial assistance   Cancer Care: 1-800-813-HOPE (4673) Provides financial assistance, online support groups, medication/co-pay assistance.     [place] DSS:  336-641-3447 Where to apply  for food stamps, Medicaid, and utility assistance   Medicare Rights Center: 800-333-4114 Helps people with Medicare understand their rights and benefits, navigate the Medicare system, and secure the quality healthcare they deserve   SCAT: 336-333-6589 [place] [employer]'s shared-ride transportation service for eligible riders who have a disability that prevents them from riding the fixed route bus.     For additional information on assistance programs please contact our social worker:   Abigail Elmore:  [phone]  

## 2018-10-21 ENCOUNTER — Telehealth: Payer: Self-pay

## 2018-10-21 ENCOUNTER — Encounter: Payer: Self-pay | Admitting: Family Medicine

## 2018-10-21 ENCOUNTER — Other Ambulatory Visit (INDEPENDENT_AMBULATORY_CARE_PROVIDER_SITE_OTHER): Payer: Medicare HMO

## 2018-10-21 DIAGNOSIS — E785 Hyperlipidemia, unspecified: Secondary | ICD-10-CM

## 2018-10-21 DIAGNOSIS — E1169 Type 2 diabetes mellitus with other specified complication: Secondary | ICD-10-CM | POA: Diagnosis not present

## 2018-10-21 DIAGNOSIS — R600 Localized edema: Secondary | ICD-10-CM | POA: Insufficient documentation

## 2018-10-21 DIAGNOSIS — G63 Polyneuropathy in diseases classified elsewhere: Secondary | ICD-10-CM | POA: Diagnosis not present

## 2018-10-21 DIAGNOSIS — L719 Rosacea, unspecified: Secondary | ICD-10-CM | POA: Insufficient documentation

## 2018-10-21 DIAGNOSIS — E118 Type 2 diabetes mellitus with unspecified complications: Secondary | ICD-10-CM

## 2018-10-21 LAB — COMPREHENSIVE METABOLIC PANEL
ALT: 20 U/L (ref 0–35)
AST: 14 U/L (ref 0–37)
Albumin: 4.1 g/dL (ref 3.5–5.2)
Alkaline Phosphatase: 76 U/L (ref 39–117)
BUN: 23 mg/dL (ref 6–23)
CO2: 29 mEq/L (ref 19–32)
Calcium: 10 mg/dL (ref 8.4–10.5)
Chloride: 103 mEq/L (ref 96–112)
Creatinine, Ser: 1.11 mg/dL (ref 0.40–1.20)
GFR: 49.28 mL/min — ABNORMAL LOW (ref >60.00)
Glucose, Bld: 163 mg/dL — ABNORMAL HIGH (ref 70–99)
Potassium: 3.8 mEq/L (ref 3.5–5.1)
Sodium: 140 mEq/L (ref 135–145)
Total Bilirubin: 0.7 mg/dL (ref 0.2–1.2)
Total Protein: 6.5 g/dL (ref 6.0–8.3)

## 2018-10-21 LAB — LIPID PANEL
Cholesterol: 120 mg/dL (ref 0–200)
HDL: 40.5 mg/dL (ref >39.00)
LDL Cholesterol: 41 mg/dL (ref 0–99)
NonHDL: 79.21
Total CHOL/HDL Ratio: 3
Triglycerides: 192 mg/dL — ABNORMAL HIGH (ref 0.0–149.0)
VLDL: 38.4 mg/dL (ref 0.0–40.0)

## 2018-10-21 LAB — VITAMIN B12: Vitamin B-12: 171 pg/mL — ABNORMAL LOW (ref 211–911)

## 2018-10-21 LAB — HEMOGLOBIN A1C: Hgb A1c MFr Bld: 7.9 % — ABNORMAL HIGH (ref 4.6–6.5)

## 2018-10-21 NOTE — Telephone Encounter (Signed)
No indication for hematology followup. Per 1/28 los

## 2018-10-22 ENCOUNTER — Telehealth: Payer: Self-pay | Admitting: Family Medicine

## 2018-10-22 ENCOUNTER — Encounter: Payer: Self-pay | Admitting: Family Medicine

## 2018-10-22 DIAGNOSIS — E538 Deficiency of other specified B group vitamins: Secondary | ICD-10-CM | POA: Insufficient documentation

## 2018-10-22 MED ORDER — AMOXICILLIN 875 MG PO TABS: 875.0000 mg | ORAL_TABLET | Freq: Two times a day (BID) | ORAL | 0 refills | Status: DC

## 2018-10-22 NOTE — Telephone Encounter (Signed)
Copied from [place] [id number]. Topic: General - Inquiry >> Oct 22, 2018 10:10 AM Margot Ables wrote: Reason for CRM: pt called stating that she is having sinus issues, drainage down the back of her throat, and coughing. She is picking up cough medicine again today. She notes clear drainage (saw Dr. Juleen China 1/27). She has headaches going from temple to temple as well. Denies fever. Pt is asking for ABX to be sent in for her again of amoxicillin (AMOXIL) 875 MG tablet. She states to make sure the message gets to Dr. Juleen China because in December she called 3x about an issue and the message never got to Dr. Juleen China. Please advise of ABX.  [facility] DR[facility] STORE [place], [place] [phone] (Phone) [phone] (Fax)

## 2018-10-22 NOTE — Telephone Encounter (Signed)
See note

## 2018-10-22 NOTE — Telephone Encounter (Signed)
Spoke to pt told her Rx for Amoxicillin was sent to [facility]. Pt verbalized understanding.

## 2018-10-22 NOTE — Telephone Encounter (Signed)
Okay to send in Amox 875 mg po BID x 10 days.

## 2018-10-22 NOTE — Telephone Encounter (Signed)
Dr. Wallace, please see message and advise. 

## 2018-10-25 ENCOUNTER — Other Ambulatory Visit: Payer: Self-pay

## 2018-10-25 DIAGNOSIS — M25512 Pain in left shoulder: Secondary | ICD-10-CM | POA: Diagnosis not present

## 2018-10-25 MED ORDER — ATORVASTATIN CALCIUM 10 MG PO TABS: ORAL_TABLET | ORAL | 0 refills | Status: DC

## 2018-10-25 MED ORDER — CLOPIDOGREL BISULFATE 75 MG PO TABS: 75.0000 mg | ORAL_TABLET | Freq: Every day | ORAL | 0 refills | Status: DC

## 2018-11-15 ENCOUNTER — Other Ambulatory Visit: Payer: Self-pay | Admitting: Family Medicine

## 2018-11-15 DIAGNOSIS — R600 Localized edema: Secondary | ICD-10-CM

## 2018-11-15 DIAGNOSIS — E039 Hypothyroidism, unspecified: Secondary | ICD-10-CM

## 2018-11-25 ENCOUNTER — Ambulatory Visit: Payer: Medicare HMO | Admitting: Cardiovascular Disease

## 2018-11-30 ENCOUNTER — Other Ambulatory Visit: Payer: Self-pay

## 2018-11-30 MED ORDER — ISOSORBIDE MONONITRATE ER 60 MG PO TB24: 60.0000 mg | ORAL_TABLET | Freq: Every day | ORAL | 2 refills | Status: DC

## 2018-12-13 ENCOUNTER — Other Ambulatory Visit: Payer: Self-pay | Admitting: Family Medicine

## 2018-12-13 DIAGNOSIS — E1165 Type 2 diabetes mellitus with hyperglycemia: Secondary | ICD-10-CM

## 2018-12-13 NOTE — Telephone Encounter (Signed)
Last OV 10/18/2018 Last refill 04/16/2018 #90.1 Next OV 01/24/2019

## 2018-12-17 ENCOUNTER — Ambulatory Visit: Payer: Medicare HMO | Admitting: Cardiology

## 2018-12-27 ENCOUNTER — Telehealth: Payer: Self-pay | Admitting: Family Medicine

## 2018-12-27 ENCOUNTER — Other Ambulatory Visit: Payer: Self-pay

## 2018-12-27 MED ORDER — GLUCOSE BLOOD VI STRP: ORAL_STRIP | 12 refills | Status: DC

## 2018-12-27 NOTE — Telephone Encounter (Signed)
Copied from [place] #[id number]. Topic: Quick Communication - Rx Refill/Question >> Dec 27, 2018 12:55 PM Wynetta Emery, [place] C wrote: Medication: True Metrix Air Test strips - 50 test strips   Has the patient contacted their pharmacy? No - this is new  (Agent: If no, request that the patient contact the pharmacy for the refill.) (Agent: If yes, when and what did the pharmacy advise?)  Preferred Pharmacy (with phone number or street name): [facility], [place] [phone] (Phone) [phone] (Fax)    Agent: Please be advised that RX refills may take up to 3 business days. We ask that you follow-up with your pharmacy.

## 2018-12-27 NOTE — Telephone Encounter (Signed)
Called in refill  

## 2018-12-27 NOTE — Telephone Encounter (Signed)
[name] [name], schedule doxy appointment to discuss further

## 2018-12-29 ENCOUNTER — Telehealth: Payer: Self-pay | Admitting: Family Medicine

## 2018-12-29 NOTE — Telephone Encounter (Signed)
Sorry, with COVID we cannot call in antibiotics. Must have visit to discuss and make sure nothing else going on.

## 2018-12-29 NOTE — Telephone Encounter (Signed)
Forwarding to Dr. Wallace.  

## 2018-12-29 NOTE — Telephone Encounter (Signed)
Pt is calling back and would like abx for sinus infection

## 2018-12-29 NOTE — Telephone Encounter (Signed)
Copied from [place] [id number]. Topic: General - Inquiry >> Dec 29, 2018 10:31 AM Rutherford Nail, NT wrote: Reason for CRM: Patient calling and states that she always has seasonal allergies around this time of year. States that she has sinus drainage, cough with clear phlegm, and sneezing. States this started yesterday. Does not wish to make an appointment at this time. Would like to know if Dr Juleen China could send a medication to her pharmacy? Please advise. [facility] DR[facility] STORE [place], [place]

## 2018-12-29 NOTE — Telephone Encounter (Signed)
Last OV 10/18/2018. Next OV 01/24/2019.  Spoke with patient, she prefers not to schedule a visit at this time.  She c/o hx of allergies, sinus infection, and bronchitis. Pt is a smoker.  Sx are sneezing, post nasal drainage, productive cough/clear - worse at night, L ear fullness, sinus pain/tenderness under eyes. Denies fever.  Sx worsening since yesterday. Has tried rx nasal spray with some relief. Has not tried any OTC meds.   Forwarding to Dr. Juleen China to advise.

## 2018-12-29 NOTE — Telephone Encounter (Signed)
Called patient added to Sam's schedule tomorrow needs link sent

## 2018-12-29 NOTE — Telephone Encounter (Signed)
I don't understand which medication she wants?

## 2018-12-30 ENCOUNTER — Encounter: Payer: Self-pay | Admitting: Physician Assistant

## 2018-12-30 ENCOUNTER — Ambulatory Visit (INDEPENDENT_AMBULATORY_CARE_PROVIDER_SITE_OTHER): Payer: Medicare HMO | Admitting: Physician Assistant

## 2018-12-30 DIAGNOSIS — J4541 Moderate persistent asthma with (acute) exacerbation: Secondary | ICD-10-CM

## 2018-12-30 DIAGNOSIS — J0110 Acute frontal sinusitis, unspecified: Secondary | ICD-10-CM | POA: Diagnosis not present

## 2018-12-30 MED ORDER — PROMETHAZINE-CODEINE 6.25-10 MG/5ML PO SOLN: 5.0000 mL | Freq: Three times a day (TID) | ORAL | 0 refills | Status: DC | PRN

## 2018-12-30 MED ORDER — DOXYCYCLINE HYCLATE 100 MG PO TABS: 100.0000 mg | ORAL_TABLET | Freq: Two times a day (BID) | ORAL | 0 refills | Status: DC

## 2018-12-30 NOTE — Progress Notes (Signed)
Virtual Visit via Video   I connected with [name] [name] on 12/30/18 at 11:00 AM EDT by a video enabled telemedicine application and verified that I am speaking with the correct person using two identifiers. Location patient: Home Location provider: [facility], Office Persons participating in the virtual visit: Alizah M Ellenie, [place], [place], Inda Coke, [place].   I discussed the limitations of evaluation and management by telemedicine and the availability of in person appointments. The patient expressed understanding and agreed to proceed.  Subjective:   HPI:  Cough Pt c/o cough since Tuesday expectorating yellow sputum, post nasal drip, headache left side of head and her eye took Tylenol with relief. Pt denies fever, chills, fatigue, chest pain, unusual SOB. Pt took Tussin DM last night with some relief.   Does have hx of COPD and DM.  Lab Results  Component Value Date   HGBA1C 7.9 (H) 10/21/2018   She is using her albuterol inhaler <1 time per day presently. Using other inhalers as prescribed.    ROS: See pertinent positives and negatives per HPI.  Patient Active Problem List   Diagnosis Date Noted  . B12 deficiency 10/22/2018  . Rosacea 10/21/2018  . Bilateral lower extremity edema 10/21/2018  . Left shoulder pain 07/21/2018  . Diabetic peripheral neuropathy associated with type 2 diabetes mellitus ([place]), on Gabapentin 04/18/2018  . Gout, intermittent, prn colchicine 04/18/2018  . Balance problem, uses cane 03/17/2018  . Hypertension associated with diabetes ([place]) 03/14/2018  . Hepatic steatosis 03/14/2018  . Polycythemia, secondary, followed by Heme, therapeutic phlebotomy 10/15/2017  . Anxiety, uses prn Xanax 05/27/2017  . Mild obstructive sleep apnea-hypopnea syndrome but with sig desats, not on CPAP 01/28/2017  . DM (diabetes mellitus) with complications ([place]), on Metformin and Invokana   . CAD, BMS to LAD 02/2008   . Bronchitis, asthmatic   . MI, old   .  Hypothyroidism (acquired), stable on Levothyroxine   . COPD GOLD 0/ prob AB still smoking  11/07/2016  . S/P coronary artery stent placement 04/16/2016  . Hypertensive heart disease 04/16/2016  . Hyperlipidemia associated with type 2 diabetes mellitus ([place]), on Lipitor 04/16/2016  . Nicotine dependence, cigarrettes, precontemplative 04/16/2016  . Morbid (severe) obesity due to excess calories ([place]) 04/16/2016    Social History   Tobacco Use  . Smoking status: Current Every Day Smoker    Packs/day: 0.50    Years: 31.00    Pack years: 15.50  . Smokeless tobacco: Never Used  Substance Use Topics  . Alcohol use: No    Current Outpatient Medications:  .  albuterol (ACCUNEB) 1.25 MG/3ML nebulizer solution, Take 3 mLs (1.25 mg total) by nebulization 3 (three) times daily as needed for wheezing., Disp: 75 mL, Rfl: 2 .  albuterol (PROAIR HFA) 108 (90 Base) MCG/ACT inhaler, Inhale 2 puffs into the lungs every 6 (six) hours as needed for wheezing or shortness of breath., Disp: 1 Inhaler, Rfl: 11 .  ALPRAZolam (XANAX) 0.5 MG tablet, Take 1 tablet (0.5 mg total) by mouth 2 (two) times daily as needed for anxiety., Disp: 60 tablet, Rfl: 3 .  aspirin 81 MG tablet, Take 81 mg by mouth daily., Disp: , Rfl:  .  atorvastatin (LIPITOR) 10 MG tablet, TAKE 1 TABLET(10 MG) BY MOUTH DAILY, Disp: 90 tablet, Rfl: 0 .  budesonide-formoterol (SYMBICORT) 80-4.5 MCG/ACT inhaler, Inhale 2 puffs into the lungs 2 (two) times daily., Disp: 10.2 g, Rfl: 3 .  clopidogrel (PLAVIX) 75 MG tablet, Take 1  tablet (75 mg total) by mouth daily., Disp: 90 tablet, Rfl: 0 .  colchicine 0.6 MG tablet, Take 1 tablet (0.6 mg total) by mouth daily. (Patient taking differently: Take 0.6 mg by mouth as needed. ), Disp: 90 tablet, Rfl: 0 .  diclofenac (VOLTAREN) 75 MG EC tablet, TK 1 T PO  BID WITH FOOD PRN, Disp: , Rfl:  .  furosemide (LASIX) 20 MG tablet, TAKE 1 TABLET(20 MG) BY MOUTH DAILY, Disp: 30 tablet, Rfl: 3 .  glucose blood test  strip, Use as instructed, Disp: 100 each, Rfl: 12 .  INVOKANA 300 MG TABS tablet, TAKE 1 TABLET BY MOUTH DAILY, Disp: 90 tablet, Rfl: 1 .  ipratropium (ATROVENT) 0.06 % nasal spray, Place 2 sprays into both nostrils 4 (four) times daily., Disp: 15 mL, Rfl: 11 .  ipratropium-albuterol (DUONEB) 0.5-2.5 (3) MG/3ML SOLN, USE 3 ML VIA NEBULIZER EVERY 6 HOURS AS NEEDED, Disp: 3240 mL, Rfl: 1 .  isosorbide mononitrate (IMDUR) 60 MG 24 hr tablet, Take 1 tablet (60 mg total) by mouth daily., Disp: 90 tablet, Rfl: 2 .  levothyroxine (SYNTHROID, LEVOTHROID) 112 MCG tablet, TAKE 1 TABLET(112 MCG) BY MOUTH DAILY, Disp: 90 tablet, Rfl: 0 .  metFORMIN (GLUCOPHAGE) 500 MG tablet, TAKE 1 TABLET(500 MG) BY MOUTH DAILY, Disp: 90 tablet, Rfl: 0 .  spironolactone (ALDACTONE) 25 MG tablet, TAKE 1 TABLET(25 MG) BY MOUTH DAILY, Disp: 90 tablet, Rfl: 1 .  doxycycline (VIBRA-TABS) 100 MG tablet, Take 1 tablet (100 mg total) by mouth 2 (two) times daily., Disp: 20 tablet, Rfl: 0 .  Promethazine-Codeine 6.25-10 MG/5ML SOLN, Take 5 mLs by mouth every 8 (eight) hours as needed., Disp: 120 mL, Rfl: 0  Allergies  Allergen Reactions  . Clindamycin/Lincomycin Other (See Comments)    Head ache   . Codeine Hives  . Dilaudid [Hydromorphone Hcl] Nausea And Vomiting  . Iodides   . Reglan [Metoclopramide] Other (See Comments)    Other reaction(s): Dystonia Hallucinations/Violent  . Reglan [Metoclopramide]   . Tramadol   . Dilaudid [Hydromorphone Hcl] Nausea And Vomiting  . Tape Rash    Blisters     Objective:   VITALS: Per patient if applicable, see vitals. GENERAL: Alert, appears well and in no acute distress. HEENT: Atraumatic, conjunctiva clear, no obvious abnormalities on inspection of external nose and ears. NECK: Normal movements of the head and neck. CARDIOPULMONARY: No increased WOB. Speaking in clear sentences. I:E ratio WNL.  MS: Moves all visible extremities without noticeable abnormality. PSYCH: Pleasant and  cooperative, well-groomed. Speech normal rate and rhythm. Affect is appropriate. Insight and judgement are appropriate. Attention is focused, linear, and appropriate.  NEURO: CN grossly intact. Oriented as arrived to appointment on time with no prompting. Moves both UE equally.  SKIN: No obvious lesions, wounds, erythema, or cyanosis noted on face or hands.  Assessment and Plan:   [name] [name] was seen today for cough.  Diagnoses and all orders for this visit:  Acute frontal sinusitis, recurrence not specified No red flags on exam.  Will initiate doxycycline and promethazine with codeine prn per orders. Sleepy precautions advised. Discussed taking medications as prescribed. Reviewed return precautions including worsening fever, SOB, worsening cough or other concerns. Push fluids and rest. I recommend that patient follow-up if symptoms worsen or persist despite treatment x 7-10 days, sooner if needed.  Due to the current COVID pandemic, I did recommend continued self-quarantining and social distancing.   Other orders -     doxycycline (VIBRA-TABS) 100 MG tablet; Take  1 tablet (100 mg total) by mouth 2 (two) times daily.   . Reviewed expectations re: course of current medical issues. . Discussed self-management of symptoms. . Outlined signs and symptoms indicating need for more acute intervention. . Patient verbalized understanding and all questions were answered. Marland Kitchen Health Maintenance issues including appropriate healthy diet, exercise, and smoking avoidance were discussed with patient. . See orders for this visit as documented in the electronic medical record.  I discussed the assessment and treatment plan with the patient. The patient was provided an opportunity to ask questions and all were answered. The patient agreed with the plan and demonstrated an understanding of the instructions.   The patient was advised to call back or seek an in-person evaluation if the symptoms worsen or if the  condition fails to improve as anticipated.  CMA or LPN served as scribe during this visit. History, Physical, and Plan performed by medical provider. The above documentation has been reviewed and is accurate and complete.    [place], [place] 12/30/2018

## 2019-01-04 ENCOUNTER — Telehealth: Payer: Self-pay | Admitting: *Deleted

## 2019-01-04 NOTE — Telephone Encounter (Signed)
Pt called back told her I received a message from the pharmacy that your cough medication was not covered that [facility] ordered. Pt said she picked it up used [facility] and paid out of pocket. Told her okay just wanted to make sure you did not need another medication. Pt said she is feeling much better. Told her good I will let Samantha know.

## 2019-01-04 NOTE — Telephone Encounter (Signed)
Left message with family member to call office.

## 2019-01-16 ENCOUNTER — Other Ambulatory Visit: Payer: Self-pay | Admitting: Cardiovascular Disease

## 2019-01-17 NOTE — Telephone Encounter (Signed)
Atorvastatin and clopidogrel refilled.

## 2019-01-20 ENCOUNTER — Telehealth: Payer: Self-pay | Admitting: Cardiology

## 2019-01-20 NOTE — Telephone Encounter (Signed)
Home phone/ consent/ my chart/ pre reg completed °

## 2019-01-21 ENCOUNTER — Telehealth: Payer: Self-pay

## 2019-01-21 ENCOUNTER — Encounter: Payer: Self-pay | Admitting: Cardiology

## 2019-01-21 ENCOUNTER — Telehealth (INDEPENDENT_AMBULATORY_CARE_PROVIDER_SITE_OTHER): Payer: Medicare HMO | Admitting: Cardiology

## 2019-01-21 VITALS — Ht 65.0 in | Wt 282.0 lb

## 2019-01-21 DIAGNOSIS — J449 Chronic obstructive pulmonary disease, unspecified: Secondary | ICD-10-CM

## 2019-01-21 DIAGNOSIS — I2510 Atherosclerotic heart disease of native coronary artery without angina pectoris: Secondary | ICD-10-CM | POA: Diagnosis not present

## 2019-01-21 DIAGNOSIS — E118 Type 2 diabetes mellitus with unspecified complications: Secondary | ICD-10-CM

## 2019-01-21 DIAGNOSIS — D751 Secondary polycythemia: Secondary | ICD-10-CM

## 2019-01-21 NOTE — Patient Instructions (Signed)
Medication Instructions:  Your physician recommends that you continue on your current medications as directed. Please refer to the Current Medication list given to you today. If you need a refill on your cardiac medications before your next appointment, please call your pharmacy.   Lab work: None  If you have labs (blood work) drawn today and your tests are completely normal, you will receive your results only by: Marland Kitchen MyChart Message (if you have MyChart) OR . A paper copy in the mail If you have any lab test that is abnormal or we need to change your treatment, we will call you to review the results.  Testing/Procedures: None   Follow-Up: At [facility], you and your health needs are our priority.  As part of our continuing mission to provide you with exceptional heart care, we have created designated Provider Care Teams.  These Care Teams include your primary Cardiologist (physician) and Advanced Practice Providers (APPs -  Physician Assistants and Nurse Practitioners) who all work together to provide you with the care you need, when you need it. You will need a follow up appointment in 12 months.  Please call our office 2 months in advance to schedule this appointment.  You may see Skeet Latch, MD or one of the following Advanced Practice Providers on your designated Care Team:   Kerin Ransom, PA-C Roby Lofts, [place] . Sande Rives, PA-C  Any Other Special Instructions Will Be Listed Below (If Applicable).

## 2019-01-21 NOTE — Progress Notes (Signed)
Virtual Visit via Telephone Note   This visit type was conducted due to national recommendations for restrictions regarding the COVID-19 Pandemic (e.g. social distancing) in an effort to limit this patient's exposure and mitigate transmission in our community.  Due to her co-morbid illnesses, this patient is at least at moderate risk for complications without adequate follow up.  This format is felt to be most appropriate for this patient at this time.  The patient did not have access to video technology/had technical difficulties with video requiring transitioning to audio format only (telephone).  All issues noted in this document were discussed and addressed.  No physical exam could be performed with this format.  Please refer to the patient's chart for her  consent to telehealth for [facility].  Evaluation Performed:  Follow-up visit  This visit type was conducted due to national recommendations for restrictions regarding the COVID-19 Pandemic (e.g. social distancing).  This format is felt to be most appropriate for this patient at this time.  All issues noted in this document were discussed and addressed.  No physical exam was performed (except for noted visual exam findings with Video Visits).  Please refer to the patient's chart (MyChart message for video visits and phone note for telephone visits) for the patient's consent to telehealth for [facility]  Date:  01/21/2019   ID:  [name] [name], [name] [name] 06-17-1951, MRN [id number]  Patient Location: Home [address] [place] [address]   Provider location:   [place]  PCP:  Briscoe Deutscher, DO  Cardiologist:  Dr Oval Linsey  Electrophysiologist:  None   Chief Complaint:  One year follow up  History of Present Illness:    [name] [name] is a 68 y.o. female who presents via audio/video conferencing for a telehealth visit today.  The patient has a history of coronary disease, she had an LAD BMS placed when she was living  in [place] in 2009.  Other problems include mild COPD and smoking, obesity, non-insulin-dependent diabetes, treated dyslipidemia, chronic renal insufficiency stage II, and polycythemia requiring periodic phlebotomy.    From a cardiac standpoint she is done well.  In February 2020 she asked to be taken off metoprolol secondary to side effects.  She said it was making her blood pressure "too low" and she was symptomatic with this.  This was approved by Dr. Oval Linsey.  Since then she is done well, she says her blood pressure usually runs in the 110's/70.  Her primary care provider follows her labs.  The patient has not had chest pain or use nitroglycerin.  She has managed to lose almost 40 pounds in the last 9 months.  She is somewhat limited by arthritis in her knees and back.  She says she and her husband recently purchased a house with a pool so she can exercise in the pool.  The patient does not symptoms concerning for COVID-19 infection (fever, chills, cough, or new SHORTNESS OF BREATH).    Prior CV studies:   The following studies were reviewed today:  Past Medical History:  Diagnosis Date  . Bronchitis, asthmatic   . Coronary artery disease    BMS to LAD 02/2008  . Diabetes mellitus without complication ([place])   . Hyperlipidemia   . Hypertensive heart disease 04/16/2016  . Hypothyroidism   . MI, old   . Morbid obesity ([place]) 04/16/2016  . S/P coronary artery stent placement 04/16/2016   BMS to LAD 2009  . Tobacco abuse 04/16/2016  Past Surgical History:  Procedure Laterality Date  . ABDOMINAL HYSTERECTOMY    . ANKLE SURGERY Right   . CHOLECYSTECTOMY    . CORONARY ANGIOPLASTY  02/2008   bare metal stent to LAD   . CORONARY STENT PLACEMENT    . KNEE ARTHROSCOPY    . ROTATOR CUFF REPAIR Bilateral   . THORACIC OUTLET SURGERY    . WRIST SURGERY       Current Meds  Medication Sig  . albuterol (ACCUNEB) 1.25 MG/3ML nebulizer solution Take 3 mLs (1.25 mg total) by nebulization 3  (three) times daily as needed for wheezing.  Marland Kitchen albuterol (PROAIR HFA) 108 (90 Base) MCG/ACT inhaler Inhale 2 puffs into the lungs every 6 (six) hours as needed for wheezing or shortness of breath.  . ALPRAZolam (XANAX) 0.5 MG tablet Take 1 tablet (0.5 mg total) by mouth 2 (two) times daily as needed for anxiety.  Marland Kitchen aspirin 81 MG tablet Take 81 mg by mouth daily.  Marland Kitchen atorvastatin (LIPITOR) 10 MG tablet TAKE 1 TABLET(10 MG) BY MOUTH DAILY  . budesonide-formoterol (SYMBICORT) 80-4.5 MCG/ACT inhaler Inhale 2 puffs into the lungs 2 (two) times daily.  . clopidogrel (PLAVIX) 75 MG tablet TAKE 1 TABLET(75 MG) BY MOUTH DAILY  . diclofenac (VOLTAREN) 75 MG EC tablet TK 1 T PO  BID WITH FOOD PRN  . furosemide (LASIX) 20 MG tablet TAKE 1 TABLET(20 MG) BY MOUTH DAILY  . glucose blood test strip Use as instructed  . INVOKANA 300 MG TABS tablet TAKE 1 TABLET BY MOUTH DAILY  . ipratropium (ATROVENT) 0.06 % nasal spray Place 2 sprays into both nostrils 4 (four) times daily.  Marland Kitchen ipratropium-albuterol (DUONEB) 0.5-2.5 (3) MG/3ML SOLN USE 3 ML VIA NEBULIZER EVERY 6 HOURS AS NEEDED  . isosorbide mononitrate (IMDUR) 60 MG 24 hr tablet Take 1 tablet (60 mg total) by mouth daily.  Marland Kitchen levothyroxine (SYNTHROID, LEVOTHROID) 112 MCG tablet TAKE 1 TABLET(112 MCG) BY MOUTH DAILY  . metFORMIN (GLUCOPHAGE) 500 MG tablet TAKE 1 TABLET(500 MG) BY MOUTH DAILY  . spironolactone (ALDACTONE) 25 MG tablet TAKE 1 TABLET(25 MG) BY MOUTH DAILY     Allergies:   Clindamycin/lincomycin; Codeine; Dilaudid [hydromorphone hcl]; Iodides; Reglan [metoclopramide]; Reglan [metoclopramide]; Tramadol; Dilaudid [hydromorphone hcl]; and Tape   Social History   Tobacco Use  . Smoking status: Current Every Day Smoker    Packs/day: 0.50    Years: 31.00    Pack years: 15.50  . Smokeless tobacco: Never Used  Substance Use Topics  . Alcohol use: No  . Drug use: No     Family Hx: The patient's family history includes Cancer in her mother;  Diabetes in her son; Hypertension in her son.  ROS:   Please see the history of present illness.    All other systems reviewed and are negative.   Labs/Other Tests and Data Reviewed:    Recent Labs: 02/01/2018: Magnesium 2.0; Pro B Natriuretic peptide (BNP) 51.0 07/19/2018: TSH 0.87 10/19/2018: Hemoglobin 17.5; Platelet Count 154 10/21/2018: ALT 20; BUN 23; Creatinine, Ser 1.11; Potassium 3.8; Sodium 140   Recent Lipid Panel Lab Results  Component Value Date/Time   CHOL 120 10/21/2018 10:09 AM   TRIG 192.0 (H) 10/21/2018 10:09 AM   HDL 40.50 10/21/2018 10:09 AM   CHOLHDL 3 10/21/2018 10:09 AM   LDLCALC 41 10/21/2018 10:09 AM   LDLDIRECT 57.0 11/27/2016 04:05 PM    Wt Readings from Last 3 Encounters:  01/21/19 282 lb (127.9 kg)  10/19/18 292 lb 11.2  oz (132.8 kg)  10/18/18 291 lb 6.4 oz (132.2 kg)     Exam:    Vital Signs:  Ht 5\' 5"  (1.651 m)   Wt 282 lb (127.9 kg)   BMI 46.93 kg/m    No acute distress over the phone  ASSESSMENT & PLAN:    CAD- H/O LAD BMS while in [place] in 2009-Myoview low risk in April 2014  COPD- Mild, smoker-1/2 PPD  NIDDM- Followed by PCP  HLD- LDL 41 Jan 2020  CRI-2 Followed by PCP  Polycythemia- Periodic phlebotomy- followed by Heme  COVID-19 Education: The signs and symptoms of COVID-19 were discussed with the patient and how to seek care for testing (follow up with PCP or arrange E-visit).  The importance of social distancing was discussed today.  Patient Risk:   After full review of this patients clinical status, I feel that they are at least moderate risk at this time.  Time:   Today, I have spent 20 minutes with the patient with telehealth technology discussing her medication and symptoms of angina.     Medication Adjustments/Labs and Tests Ordered: Current medicines are reviewed at length with the patient today.  Concerns regarding medicines are outlined above.  Tests Ordered: No orders of the defined types were placed  in this encounter.  Medication Changes: No orders of the defined types were placed in this encounter.   Disposition:  Dr Oval Linsey one year  Signed, Kerin Ransom, PA-C  01/21/2019 10:12 AM    [place]

## 2019-01-21 NOTE — Telephone Encounter (Signed)
Contacted patient to discuss AVS instructions. Patient notified and voiced understanding. 

## 2019-01-24 ENCOUNTER — Encounter: Payer: Self-pay | Admitting: Family Medicine

## 2019-01-24 ENCOUNTER — Other Ambulatory Visit: Payer: Self-pay

## 2019-01-24 ENCOUNTER — Ambulatory Visit (INDEPENDENT_AMBULATORY_CARE_PROVIDER_SITE_OTHER): Payer: Medicare HMO | Admitting: Family Medicine

## 2019-01-24 ENCOUNTER — Ambulatory Visit: Payer: Medicare HMO | Admitting: Family Medicine

## 2019-01-24 VITALS — BP 120/70 | Ht 65.0 in | Wt 287.0 lb

## 2019-01-24 DIAGNOSIS — E118 Type 2 diabetes mellitus with unspecified complications: Secondary | ICD-10-CM | POA: Diagnosis not present

## 2019-01-24 DIAGNOSIS — E785 Hyperlipidemia, unspecified: Secondary | ICD-10-CM

## 2019-01-24 DIAGNOSIS — E039 Hypothyroidism, unspecified: Secondary | ICD-10-CM

## 2019-01-24 DIAGNOSIS — I10 Essential (primary) hypertension: Secondary | ICD-10-CM | POA: Diagnosis not present

## 2019-01-24 DIAGNOSIS — I152 Hypertension secondary to endocrine disorders: Secondary | ICD-10-CM

## 2019-01-24 DIAGNOSIS — J449 Chronic obstructive pulmonary disease, unspecified: Secondary | ICD-10-CM | POA: Diagnosis not present

## 2019-01-24 DIAGNOSIS — E1169 Type 2 diabetes mellitus with other specified complication: Secondary | ICD-10-CM

## 2019-01-24 DIAGNOSIS — E1165 Type 2 diabetes mellitus with hyperglycemia: Secondary | ICD-10-CM | POA: Diagnosis not present

## 2019-01-24 DIAGNOSIS — E1159 Type 2 diabetes mellitus with other circulatory complications: Secondary | ICD-10-CM | POA: Diagnosis not present

## 2019-01-24 MED ORDER — INVOKANA 300 MG PO TABS: ORAL_TABLET | ORAL | 1 refills | Status: DC

## 2019-01-24 NOTE — Progress Notes (Signed)
Virtual Visit via Video   Due to the COVID-19 pandemic, this visit was completed with telemedicine (audio/video) technology to reduce patient and provider exposure as well as to preserve personal protective equipment.   I connected with [name] [name] by a video enabled telemedicine application and verified that I am speaking with the correct person using two identifiers. Location patient: Home Location provider: [facility], Office Persons participating in the virtual visit: [name] [name], Khye Hochstetler, DO   I discussed the limitations of evaluation and management by telemedicine and the availability of in person appointments. The patient expressed understanding and agreed to proceed.  Care Team   Patient Care Team: Briscoe Deutscher, DO as PCP - General (Family Medicine) Skeet Latch, MD as Attending Physician (Cardiology) Ardath Sax, MD (Inactive) as Consulting Physician (Hematology and Oncology) Almedia Balls, MD as Consulting Physician (Orthopedic Surgery)  Subjective:   HPI:   Current symptoms: none. Patient denies nervousness and palpitations. Symptoms have stabilized.  Lab Results  Component Value Date   TSH 0.87 07/19/2018   TSH 0.62 03/15/2018   TSH 0.59 02/01/2018   Lab Results  Component Value Date   FREET4 1.14 03/15/2018   FREET4 1.09 02/01/2018   FREET4 1.22 08/28/2017     DM  Medication compliance: compliant most of the time, diabetic diet compliance: compliant most of the time, home glucose monitoring: is performed regularly, further diabetic ROS: no polyuria or polydipsia, no chest pain, dyspnea or TIA's, no numbness, tingling or pain in extremities. She has cut back on her soda intake. She will have pool open soon and be able to work on exercise more. She has been checking her blood sugars at home and they are averaging between 118 - 126.    Smoking: she has been trying to cut back on smoking but she has the kids are home and making that hard.    Review of Systems  Constitutional: Negative for chills and fever.  HENT: Negative for ear pain and hearing loss.   Eyes: Negative for blurred vision and double vision.  Respiratory: Negative for cough.   Cardiovascular: Negative for chest pain and palpitations.  Gastrointestinal: Negative for heartburn, nausea and vomiting.  Genitourinary: Negative for dysuria and urgency.  Musculoskeletal: Negative for myalgias.  Skin: Negative for rash.  Neurological: Negative for dizziness and headaches.  Endo/Heme/Allergies: Does not bruise/bleed easily.  Psychiatric/Behavioral: Negative for depression and suicidal ideas.    Patient Active Problem List   Diagnosis Date Noted  . B12 deficiency 10/22/2018  . Rosacea 10/21/2018  . Bilateral lower extremity edema 10/21/2018  . Left shoulder pain 07/21/2018  . Diabetic peripheral neuropathy associated with type 2 diabetes mellitus ([place]), on Gabapentin 04/18/2018  . Gout, intermittent, prn colchicine 04/18/2018  . Balance problem, uses cane 03/17/2018  . Hypertension associated with diabetes ([place]) 03/14/2018  . Hepatic steatosis 03/14/2018  . Polycythemia, secondary, followed by Heme, therapeutic phlebotomy 10/15/2017  . Anxiety, uses prn Xanax 05/27/2017  . Mild obstructive sleep apnea-hypopnea syndrome but with sig desats, not on CPAP 01/28/2017  . DM (diabetes mellitus) with complications ([place]), on Metformin and Invokana   . CAD, BMS to LAD 02/2008   . Bronchitis, asthmatic   . MI, old   . Hypothyroidism (acquired), stable on Levothyroxine   . COPD GOLD 0/ prob AB still smoking  11/07/2016  . S/P coronary artery stent placement 04/16/2016  . Hypertensive heart disease 04/16/2016  . Hyperlipidemia associated with type 2 diabetes mellitus ([place]), on Lipitor  04/16/2016  . Nicotine dependence, cigarrettes, precontemplative 04/16/2016  . Morbid (severe) obesity due to excess calories ([place]) 04/16/2016    Social History   Tobacco Use  . Smoking  status: Current Every Day Smoker    Packs/day: 0.50    Years: 31.00    Pack years: 15.50  . Smokeless tobacco: Never Used  Substance Use Topics  . Alcohol use: No    Current Outpatient Medications:  .  albuterol (ACCUNEB) 1.25 MG/3ML nebulizer solution, Take 3 mLs (1.25 mg total) by nebulization 3 (three) times daily as needed for wheezing., Disp: 75 mL, Rfl: 2 .  albuterol (PROAIR HFA) 108 (90 Base) MCG/ACT inhaler, Inhale 2 puffs into the lungs every 6 (six) hours as needed for wheezing or shortness of breath., Disp: 1 Inhaler, Rfl: 11 .  ALPRAZolam (XANAX) 0.5 MG tablet, Take 1 tablet (0.5 mg total) by mouth 2 (two) times daily as needed for anxiety., Disp: 60 tablet, Rfl: 3 .  aspirin 81 MG tablet, Take 81 mg by mouth daily., Disp: , Rfl:  .  atorvastatin (LIPITOR) 10 MG tablet, TAKE 1 TABLET(10 MG) BY MOUTH DAILY, Disp: 90 tablet, Rfl: 0 .  budesonide-formoterol (SYMBICORT) 80-4.5 MCG/ACT inhaler, Inhale 2 puffs into the lungs 2 (two) times daily., Disp: 10.2 g, Rfl: 3 .  clopidogrel (PLAVIX) 75 MG tablet, TAKE 1 TABLET(75 MG) BY MOUTH DAILY, Disp: 90 tablet, Rfl: 0 .  diclofenac (VOLTAREN) 75 MG EC tablet, TK 1 T PO  BID WITH FOOD PRN, Disp: , Rfl:  .  furosemide (LASIX) 20 MG tablet, TAKE 1 TABLET(20 MG) BY MOUTH DAILY, Disp: 30 tablet, Rfl: 3 .  glucose blood test strip, Use as instructed, Disp: 100 each, Rfl: 12 .  INVOKANA 300 MG TABS tablet, TAKE 1 TABLET BY MOUTH DAILY, Disp: 90 tablet, Rfl: 1 .  ipratropium (ATROVENT) 0.06 % nasal spray, Place 2 sprays into both nostrils 4 (four) times daily., Disp: 15 mL, Rfl: 11 .  ipratropium-albuterol (DUONEB) 0.5-2.5 (3) MG/3ML SOLN, USE 3 ML VIA NEBULIZER EVERY 6 HOURS AS NEEDED, Disp: 3240 mL, Rfl: 1 .  levothyroxine (SYNTHROID, LEVOTHROID) 112 MCG tablet, TAKE 1 TABLET(112 MCG) BY MOUTH DAILY, Disp: 90 tablet, Rfl: 0 .  metFORMIN (GLUCOPHAGE) 500 MG tablet, TAKE 1 TABLET(500 MG) BY MOUTH DAILY, Disp: 90 tablet, Rfl: 0 .  spironolactone  (ALDACTONE) 25 MG tablet, TAKE 1 TABLET(25 MG) BY MOUTH DAILY, Disp: 90 tablet, Rfl: 1  Allergies  Allergen Reactions  . Clindamycin/Lincomycin Other (See Comments)    Head ache   . Codeine Hives  . Dilaudid [Hydromorphone Hcl] Nausea And Vomiting  . Iodides   . Reglan [Metoclopramide] Other (See Comments)    Other reaction(s): Dystonia Hallucinations/Violent  . Reglan [Metoclopramide]   . Tramadol   . Dilaudid [Hydromorphone Hcl] Nausea And Vomiting  . Tape Rash    Blisters     Objective:   VITALS: Per patient if applicable, see vitals. GENERAL: Alert, appears well and in no acute distress. HEENT: Atraumatic, conjunctiva clear, no obvious abnormalities on inspection of external nose and ears. NECK: Normal movements of the head and neck. CARDIOPULMONARY: No increased WOB. Speaking in clear sentences. I:E ratio WNL.  MS: Moves all visible extremities without noticeable abnormality. PSYCH: Pleasant and cooperative, well-groomed. Speech normal rate and rhythm. Affect is appropriate. Insight and judgement are appropriate. Attention is focused, linear, and appropriate.  NEURO: CN grossly intact. Oriented as arrived to appointment on time with no prompting. Moves both UE equally.  SKIN:  No obvious lesions, wounds, erythema, or cyanosis noted on face or hands.  Depression screen [facility] 2/9 10/18/2018 10/18/2018 09/24/2018  Decreased Interest 0 0 1  Down, Depressed, Hopeless 1 1 0  PHQ - 2 Score 1 1 1   Altered sleeping 0 0 1  Tired, decreased energy 0 0 1  Change in appetite 1 1 1   Feeling bad or failure about yourself  0 0 1  Trouble concentrating 0 0 0  Moving slowly or fidgety/restless 0 0 0  Suicidal thoughts 0 0 0  PHQ-9 Score 2 2 5   Difficult doing work/chores Not difficult at all Not difficult at all Not difficult at all    Assessment and Plan:   [name] [name] was seen today for follow-up.  Diagnoses and all orders for this visit:  Hypertension associated with diabetes ([place]) Well  controlled.  No signs of complications, medication side effects, or red flags.  Continue current regimen.    -     CBC with Differential/Platelet; Future -     Comprehensive metabolic panel; Future -     Lipid panel; Future  COPD GOLD 0/ prob AB still smoking  I advised patient to quit smoking, and offered support. [place] QUITLINE: 1-800-QUIT-NOW [phone]).  Hyperlipidemia associated with type 2 diabetes mellitus ([place]), on Lipitor Well controlled.  No signs of complications, medication side effects, or red flags.  Continue current regimen.    DM (diabetes mellitus) with complications ([place]), on Metformin and Invokana -     Microalbumin / creatinine urine ratio; Future -     Hemoglobin A1c; Future  Hypothyroidism (acquired), stable on Levothyroxine -     TSH; Future  Well controlled.  No signs of complications, medication side effects, or red flags.  Continue current regimen.    Type 2 diabetes mellitus with hyperglycemia, without long-term current use of insulin (H[facility]) -     Magnesium; Future -     INVOKANA 300 MG TABS tablet; TAKE 1 TABLET BY MOUTH DAILY  Well controlled.  No signs of complications, medication side effects, or red flags.  Continue current regimen.    Marland Kitchen COVID-19 Education: The signs and symptoms of COVID-19 were discussed with the patient and how to seek care for testing if needed. The importance of social distancing was discussed today. . Reviewed expectations re: course of current medical issues. . Discussed self-management of symptoms. . Outlined signs and symptoms indicating need for more acute intervention. . Patient verbalized understanding and all questions were answered. Marland Kitchen Health Maintenance issues including appropriate healthy diet, exercise, and smoking avoidance were discussed with patient. . See orders for this visit as documented in the electronic medical record.  Briscoe Deutscher, DO  Records requested if needed. Time spent: 25 minutes, of which >50%  was spent in obtaining information about her symptoms, reviewing her previous labs, evaluations, and treatments, counseling her about her condition (please see the discussed topics above), and developing a plan to further investigate it; she had a number of questions which I addressed.

## 2019-02-04 ENCOUNTER — Other Ambulatory Visit: Payer: Self-pay

## 2019-02-04 ENCOUNTER — Other Ambulatory Visit (INDEPENDENT_AMBULATORY_CARE_PROVIDER_SITE_OTHER): Payer: Medicare HMO

## 2019-02-04 DIAGNOSIS — E1165 Type 2 diabetes mellitus with hyperglycemia: Secondary | ICD-10-CM

## 2019-02-04 DIAGNOSIS — I10 Essential (primary) hypertension: Secondary | ICD-10-CM

## 2019-02-04 DIAGNOSIS — E1159 Type 2 diabetes mellitus with other circulatory complications: Secondary | ICD-10-CM

## 2019-02-04 DIAGNOSIS — E118 Type 2 diabetes mellitus with unspecified complications: Secondary | ICD-10-CM | POA: Diagnosis not present

## 2019-02-04 DIAGNOSIS — E039 Hypothyroidism, unspecified: Secondary | ICD-10-CM | POA: Diagnosis not present

## 2019-02-04 LAB — MICROALBUMIN / CREATININE URINE RATIO
Creatinine,U: 46.3 mg/dL
Microalb Creat Ratio: 1.5 mg/g (ref 0.0–30.0)
Microalb, Ur: <0.7 mg/dL (ref 0.0–1.9)

## 2019-02-04 LAB — COMPREHENSIVE METABOLIC PANEL
ALT: 19 U/L (ref 0–35)
AST: 14 U/L (ref 0–37)
Albumin: 4 g/dL (ref 3.5–5.2)
Alkaline Phosphatase: 93 U/L (ref 39–117)
BUN: 17 mg/dL (ref 6–23)
CO2: 30 mEq/L (ref 19–32)
Calcium: 9.7 mg/dL (ref 8.4–10.5)
Chloride: 101 mEq/L (ref 96–112)
Creatinine, Ser: 1.27 mg/dL — ABNORMAL HIGH (ref 0.40–1.20)
GFR: 42.15 mL/min — ABNORMAL LOW (ref >60.00)
Glucose, Bld: 138 mg/dL — ABNORMAL HIGH (ref 70–99)
Potassium: 4.1 mEq/L (ref 3.5–5.1)
Sodium: 141 mEq/L (ref 135–145)
Total Bilirubin: 0.9 mg/dL (ref 0.2–1.2)
Total Protein: 6.9 g/dL (ref 6.0–8.3)

## 2019-02-04 LAB — LIPID PANEL
Cholesterol: 131 mg/dL (ref 0–200)
HDL: 37.5 mg/dL — ABNORMAL LOW (ref >39.00)
NonHDL: 93.71
Total CHOL/HDL Ratio: 3
Triglycerides: 211 mg/dL — ABNORMAL HIGH (ref 0.0–149.0)
VLDL: 42.2 mg/dL — ABNORMAL HIGH (ref 0.0–40.0)

## 2019-02-04 LAB — CBC WITH DIFFERENTIAL/PLATELET
Basophils Absolute: 0 10*3/uL (ref 0.0–0.1)
Basophils Relative: 0.4 % (ref 0.0–3.0)
Eosinophils Absolute: 0.2 10*3/uL (ref 0.0–0.7)
Eosinophils Relative: 2.2 % (ref 0.0–5.0)
HCT: 50.4 % — ABNORMAL HIGH (ref 36.0–46.0)
Hemoglobin: 17.2 g/dL — ABNORMAL HIGH (ref 12.0–15.0)
Lymphocytes Relative: 31 % (ref 12.0–46.0)
Lymphs Abs: 2.4 10*3/uL (ref 0.7–4.0)
MCHC: 34.1 g/dL (ref 30.0–36.0)
MCV: 95.7 fl (ref 78.0–100.0)
Monocytes Absolute: 0.6 10*3/uL (ref 0.1–1.0)
Monocytes Relative: 7.3 % (ref 3.0–12.0)
Neutro Abs: 4.5 10*3/uL (ref 1.4–7.7)
Neutrophils Relative %: 59.1 % (ref 43.0–77.0)
Platelets: 202 10*3/uL (ref 150.0–400.0)
RBC: 5.27 Mil/uL — ABNORMAL HIGH (ref 3.87–5.11)
RDW: 14.2 % (ref 11.5–15.5)
WBC: 7.6 10*3/uL (ref 4.0–10.5)

## 2019-02-04 LAB — TSH: TSH: 1.72 u[IU]/mL (ref 0.35–4.50)

## 2019-02-04 LAB — HEMOGLOBIN A1C: Hgb A1c MFr Bld: 7.6 % — ABNORMAL HIGH (ref 4.6–6.5)

## 2019-02-04 LAB — MAGNESIUM: Magnesium: 1.8 mg/dL (ref 1.5–2.5)

## 2019-02-04 LAB — LDL CHOLESTEROL, DIRECT: Direct LDL: 55 mg/dL

## 2019-02-15 ENCOUNTER — Observation Stay (HOSPITAL_COMMUNITY)
Admission: EM | Admit: 2019-02-15 | Discharge: 2019-02-16 | Disposition: A | Discharge status: Home / Self Care | Payer: Medicare HMO | Attending: Internal Medicine | Admitting: Internal Medicine

## 2019-02-15 ENCOUNTER — Emergency Department (HOSPITAL_COMMUNITY): Payer: Medicare HMO

## 2019-02-15 ENCOUNTER — Encounter (HOSPITAL_COMMUNITY): Payer: Self-pay | Admitting: Emergency Medicine

## 2019-02-15 ENCOUNTER — Other Ambulatory Visit: Payer: Self-pay

## 2019-02-15 ENCOUNTER — Observation Stay (HOSPITAL_COMMUNITY): Payer: Medicare HMO

## 2019-02-15 ENCOUNTER — Ambulatory Visit: Payer: Self-pay | Admitting: Family Medicine

## 2019-02-15 DIAGNOSIS — Z1159 Encounter for screening for other viral diseases: Secondary | ICD-10-CM | POA: Insufficient documentation

## 2019-02-15 DIAGNOSIS — F17210 Nicotine dependence, cigarettes, uncomplicated: Secondary | ICD-10-CM | POA: Diagnosis not present

## 2019-02-15 DIAGNOSIS — Z7982 Long term (current) use of aspirin: Secondary | ICD-10-CM | POA: Insufficient documentation

## 2019-02-15 DIAGNOSIS — E119 Type 2 diabetes mellitus without complications: Secondary | ICD-10-CM | POA: Diagnosis not present

## 2019-02-15 DIAGNOSIS — Z79899 Other long term (current) drug therapy: Secondary | ICD-10-CM | POA: Diagnosis not present

## 2019-02-15 DIAGNOSIS — E785 Hyperlipidemia, unspecified: Secondary | ICD-10-CM | POA: Diagnosis not present

## 2019-02-15 DIAGNOSIS — R42 Dizziness and giddiness: Secondary | ICD-10-CM | POA: Diagnosis not present

## 2019-02-15 DIAGNOSIS — Z20828 Contact with and (suspected) exposure to other viral communicable diseases: Secondary | ICD-10-CM | POA: Diagnosis not present

## 2019-02-15 DIAGNOSIS — I10 Essential (primary) hypertension: Secondary | ICD-10-CM | POA: Diagnosis not present

## 2019-02-15 DIAGNOSIS — R079 Chest pain, unspecified: Secondary | ICD-10-CM | POA: Diagnosis present

## 2019-02-15 DIAGNOSIS — R0789 Other chest pain: Secondary | ICD-10-CM | POA: Diagnosis not present

## 2019-02-15 DIAGNOSIS — I2510 Atherosclerotic heart disease of native coronary artery without angina pectoris: Secondary | ICD-10-CM | POA: Insufficient documentation

## 2019-02-15 DIAGNOSIS — Z7984 Long term (current) use of oral hypoglycemic drugs: Secondary | ICD-10-CM | POA: Insufficient documentation

## 2019-02-15 DIAGNOSIS — I700 Atherosclerosis of aorta: Secondary | ICD-10-CM | POA: Diagnosis not present

## 2019-02-15 DIAGNOSIS — R072 Precordial pain: Secondary | ICD-10-CM | POA: Diagnosis not present

## 2019-02-15 LAB — CBC WITH DIFFERENTIAL/PLATELET
Abs Immature Granulocytes: 0.03 10*3/uL (ref 0.00–0.07)
Basophils Absolute: 0.1 10*3/uL (ref 0.0–0.1)
Basophils Relative: 1 %
Eosinophils Absolute: 0.2 10*3/uL (ref 0.0–0.5)
Eosinophils Relative: 2 %
HCT: 49.1 % — ABNORMAL HIGH (ref 36.0–46.0)
Hemoglobin: 16 g/dL — ABNORMAL HIGH (ref 12.0–15.0)
Immature Granulocytes: 0 %
Lymphocytes Relative: 31 %
Lymphs Abs: 2.8 10*3/uL (ref 0.7–4.0)
MCH: 31.6 pg (ref 26.0–34.0)
MCHC: 32.6 g/dL (ref 30.0–36.0)
MCV: 96.8 fL (ref 80.0–100.0)
Monocytes Absolute: 0.6 10*3/uL (ref 0.1–1.0)
Monocytes Relative: 7 %
Neutro Abs: 5.4 10*3/uL (ref 1.7–7.7)
Neutrophils Relative %: 59 %
Platelets: 182 10*3/uL (ref 150–400)
RBC: 5.07 MIL/uL (ref 3.87–5.11)
RDW: 13.8 % (ref 11.5–15.5)
WBC: 9.1 10*3/uL (ref 4.0–10.5)
nRBC: 0 % (ref 0.0–0.2)

## 2019-02-15 LAB — COMPREHENSIVE METABOLIC PANEL
ALT: 24 U/L (ref 0–44)
AST: 19 U/L (ref 15–41)
Albumin: 3.5 g/dL (ref 3.5–5.0)
Alkaline Phosphatase: 75 U/L (ref 38–126)
Anion gap: 12 (ref 5–15)
BUN: 22 mg/dL (ref 8–23)
CO2: 25 mmol/L (ref 22–32)
Calcium: 9.7 mg/dL (ref 8.9–10.3)
Chloride: 104 mmol/L (ref 98–111)
Creatinine, Ser: 1.47 mg/dL — ABNORMAL HIGH (ref 0.44–1.00)
GFR calc Af Amer: 43 mL/min — ABNORMAL LOW (ref >60)
GFR calc non Af Amer: 37 mL/min — ABNORMAL LOW (ref >60)
Glucose, Bld: 142 mg/dL — ABNORMAL HIGH (ref 70–99)
Potassium: 4 mmol/L (ref 3.5–5.1)
Sodium: 141 mmol/L (ref 135–145)
Total Bilirubin: 0.6 mg/dL (ref 0.3–1.2)
Total Protein: 6.3 g/dL — ABNORMAL LOW (ref 6.5–8.1)

## 2019-02-15 LAB — CBC
HCT: 46.6 % — ABNORMAL HIGH (ref 36.0–46.0)
Hemoglobin: 15.3 g/dL — ABNORMAL HIGH (ref 12.0–15.0)
MCH: 31.5 pg (ref 26.0–34.0)
MCHC: 32.8 g/dL (ref 30.0–36.0)
MCV: 96.1 fL (ref 80.0–100.0)
Platelets: 164 10*3/uL (ref 150–400)
RBC: 4.85 MIL/uL (ref 3.87–5.11)
RDW: 13.9 % (ref 11.5–15.5)
WBC: 7.8 10*3/uL (ref 4.0–10.5)
nRBC: 0 % (ref 0.0–0.2)

## 2019-02-15 LAB — CREATININE, SERUM
Creatinine, Ser: 1.37 mg/dL — ABNORMAL HIGH (ref 0.44–1.00)
GFR calc Af Amer: 47 mL/min — ABNORMAL LOW (ref >60)
GFR calc non Af Amer: 40 mL/min — ABNORMAL LOW (ref >60)

## 2019-02-15 LAB — GLUCOSE, CAPILLARY: Glucose-Capillary: 158 mg/dL — ABNORMAL HIGH (ref 70–99)

## 2019-02-15 LAB — I-STAT TROPONIN, ED: Troponin i, poc: 0.01 ng/mL (ref 0.00–0.08)

## 2019-02-15 LAB — LIPID PANEL
Cholesterol: 111 mg/dL (ref 0–200)
HDL: 35 mg/dL — ABNORMAL LOW (ref >40)
LDL Cholesterol: 28 mg/dL (ref 0–99)
Total CHOL/HDL Ratio: 3.2 RATIO
Triglycerides: 240 mg/dL — ABNORMAL HIGH (ref <150)
VLDL: 48 mg/dL — ABNORMAL HIGH (ref 0–40)

## 2019-02-15 LAB — MAGNESIUM: Magnesium: 1.5 mg/dL — ABNORMAL LOW (ref 1.7–2.4)

## 2019-02-15 LAB — TROPONIN I: Troponin I: <0.03 ng/mL (ref <0.03)

## 2019-02-15 LAB — SARS CORONAVIRUS 2 BY RT PCR (HOSPITAL ORDER, PERFORMED IN ~~LOC~~ HOSPITAL LAB): SARS Coronavirus 2: NEGATIVE

## 2019-02-15 MED ORDER — CLOPIDOGREL BISULFATE 75 MG PO TABS
75.0000 mg | ORAL_TABLET | Freq: Every day | ORAL | Status: DC
  Administered 2019-02-16: 75 mg via ORAL
  Filled 2019-02-15: qty 1

## 2019-02-15 MED ORDER — ALBUTEROL SULFATE (2.5 MG/3ML) 0.083% IN NEBU: 2.5000 mg | INHALATION_SOLUTION | Freq: Three times a day (TID) | RESPIRATORY_TRACT | Status: DC | PRN

## 2019-02-15 MED ORDER — NICOTINE 14 MG/24HR TD PT24
14.0000 mg | MEDICATED_PATCH | Freq: Every day | TRANSDERMAL | Status: DC
  Administered 2019-02-16: 14 mg via TRANSDERMAL
  Filled 2019-02-15: qty 1

## 2019-02-15 MED ORDER — IOHEXOL 350 MG/ML SOLN
100.0000 mL | Freq: Once | INTRAVENOUS | Status: AC | PRN
  Administered 2019-02-15: 100 mL via INTRAVENOUS

## 2019-02-15 MED ORDER — MOMETASONE FURO-FORMOTEROL FUM 100-5 MCG/ACT IN AERO
2.0000 | INHALATION_SPRAY | Freq: Two times a day (BID) | RESPIRATORY_TRACT | Status: DC
  Filled 2019-02-15 (×3): qty 8.8

## 2019-02-15 MED ORDER — ENOXAPARIN SODIUM 40 MG/0.4ML ~~LOC~~ SOLN
40.0000 mg | SUBCUTANEOUS | Status: DC
  Administered 2019-02-15: 40 mg via SUBCUTANEOUS
  Filled 2019-02-15: qty 0.4

## 2019-02-15 MED ORDER — INSULIN ASPART 100 UNIT/ML ~~LOC~~ SOLN: 0.0000 [IU] | Freq: Three times a day (TID) | SUBCUTANEOUS | Status: DC

## 2019-02-15 MED ORDER — ASPIRIN EC 81 MG PO TBEC
81.0000 mg | DELAYED_RELEASE_TABLET | Freq: Every day | ORAL | Status: DC
  Administered 2019-02-16: 81 mg via ORAL
  Filled 2019-02-15: qty 1

## 2019-02-15 MED ORDER — ALPRAZOLAM 0.5 MG PO TABS
0.5000 mg | ORAL_TABLET | Freq: Two times a day (BID) | ORAL | Status: DC | PRN
  Administered 2019-02-15: 0.5 mg via ORAL
  Filled 2019-02-15: qty 1

## 2019-02-15 MED ORDER — PANTOPRAZOLE SODIUM 40 MG PO TBEC
40.0000 mg | DELAYED_RELEASE_TABLET | Freq: Two times a day (BID) | ORAL | Status: DC
  Administered 2019-02-15 – 2019-02-16 (×2): 40 mg via ORAL
  Filled 2019-02-15 (×2): qty 1

## 2019-02-15 MED ORDER — INSULIN ASPART 100 UNIT/ML ~~LOC~~ SOLN: 0.0000 [IU] | Freq: Every day | SUBCUTANEOUS | Status: DC

## 2019-02-15 MED ORDER — ALBUTEROL SULFATE (2.5 MG/3ML) 0.083% IN NEBU
2.5000 mg | INHALATION_SOLUTION | Freq: Every day | RESPIRATORY_TRACT | Status: DC
  Administered 2019-02-15: 2.5 mg via RESPIRATORY_TRACT
  Filled 2019-02-15: qty 3

## 2019-02-15 MED ORDER — IPRATROPIUM BROMIDE 0.06 % NA SOLN: 2.0000 | Freq: Four times a day (QID) | NASAL | Status: DC

## 2019-02-15 MED ORDER — SPIRONOLACTONE 25 MG PO TABS
25.0000 mg | ORAL_TABLET | Freq: Every day | ORAL | Status: DC
  Administered 2019-02-16: 25 mg via ORAL
  Filled 2019-02-15: qty 1

## 2019-02-15 MED ORDER — SODIUM CHLORIDE 0.9 % IV SOLN
INTRAVENOUS | Status: DC
  Administered 2019-02-15: 17:00:00 via INTRAVENOUS

## 2019-02-15 MED ORDER — LEVOTHYROXINE SODIUM 112 MCG PO TABS
112.0000 ug | ORAL_TABLET | Freq: Every day | ORAL | Status: DC
  Administered 2019-02-16: 112 ug via ORAL
  Filled 2019-02-15: qty 1

## 2019-02-15 MED ORDER — ATORVASTATIN CALCIUM 10 MG PO TABS
10.0000 mg | ORAL_TABLET | Freq: Every day | ORAL | Status: DC
  Administered 2019-02-16: 10 mg via ORAL
  Filled 2019-02-15: qty 1

## 2019-02-15 NOTE — ED Notes (Signed)
Taken to ct at this time. 

## 2019-02-15 NOTE — ED Notes (Addendum)
Husband Keyleigh Manninen) is waiting outside for update. He does not have a cell phone.

## 2019-02-15 NOTE — ED Provider Notes (Signed)
[facility] EMERGENCY DEPARTMENT Provider Note   CSN: [id number] Arrival date & time: 02/15/19  1630    History   Chief Complaint Chief Complaint  Patient presents with  . Chest Pain    HPI [name] [name] is a 68 y.o. female.     This is a 68 year old female with a history of coronary artery disease who presents with chest pain which started substernal and went back to her shoulder blades.  Denied any syncope or near syncope.  No associated dyspnea or shortness of breath.  Pain seems to wax and wane nothing makes it better or worse.  History of similar symptoms associated with angina 10 years ago and she does have a coronary artery stent.  She attempted to self medicate with Xanax without relief.  Called EMS and was given 1 nitroglycerin sublingual and pain is now gone     Past Medical History:  Diagnosis Date  . Bronchitis, asthmatic   . Coronary artery disease    BMS to LAD 02/2008  . Diabetes mellitus without complication ([place])   . Hyperlipidemia   . Hypertensive heart disease 04/16/2016  . Hypothyroidism   . MI, old   . Morbid obesity ([place]) 04/16/2016  . S/P coronary artery stent placement 04/16/2016   BMS to LAD 2009  . Tobacco abuse 04/16/2016    Patient Active Problem List   Diagnosis Date Noted  . B12 deficiency 10/22/2018  . Rosacea 10/21/2018  . Bilateral lower extremity edema 10/21/2018  . Left shoulder pain 07/21/2018  . Diabetic peripheral neuropathy associated with type 2 diabetes mellitus ([place]), on Gabapentin 04/18/2018  . Gout, intermittent, prn colchicine 04/18/2018  . Balance problem, uses cane 03/17/2018  . Hypertension associated with diabetes ([place]) 03/14/2018  . Hepatic steatosis 03/14/2018  . Polycythemia, secondary, followed by Heme, therapeutic phlebotomy 10/15/2017  . Anxiety, uses prn Xanax 05/27/2017  . Mild obstructive sleep apnea-hypopnea syndrome but with sig desats, not on CPAP 01/28/2017  . DM (diabetes mellitus) with  complications ([place]), on Metformin and Invokana   . CAD, BMS to LAD 02/2008   . Bronchitis, asthmatic   . MI, old   . Hypothyroidism (acquired), stable on Levothyroxine   . COPD GOLD 0/ prob AB still smoking  11/07/2016  . S/P coronary artery stent placement 04/16/2016  . Hypertensive heart disease 04/16/2016  . Hyperlipidemia associated with type 2 diabetes mellitus ([place]), on Lipitor 04/16/2016  . Nicotine dependence, cigarrettes, precontemplative 04/16/2016  . Morbid (severe) obesity due to excess calories ([place]) 04/16/2016    Past Surgical History:  Procedure Laterality Date  . ABDOMINAL HYSTERECTOMY    . ANKLE SURGERY Right   . CHOLECYSTECTOMY    . CORONARY ANGIOPLASTY  02/2008   bare metal stent to LAD   . CORONARY STENT PLACEMENT    . KNEE ARTHROSCOPY    . ROTATOR CUFF REPAIR Bilateral   . THORACIC OUTLET SURGERY    . WRIST SURGERY       OB History    Gravida  2   Para  2   Term  0   Preterm  0   AB  0   Living        SAB  0   TAB  0   Ectopic  0   Multiple      Live Births               Home Medications    Prior to Admission medications   Medication Sig  Start Date End Date Taking? Authorizing Provider  albuterol (ACCUNEB) 1.25 MG/3ML nebulizer solution Take 3 mLs (1.25 mg total) by nebulization 3 (three) times daily as needed for wheezing. 07/09/18   Briscoe Deutscher, DO  albuterol (PROAIR HFA) 108 (90 Base) MCG/ACT inhaler Inhale 2 puffs into the lungs every 6 (six) hours as needed for wheezing or shortness of breath. 10/18/18   Briscoe Deutscher, DO  ALPRAZolam Duanne Moron) 0.5 MG tablet Take 1 tablet (0.5 mg total) by mouth 2 (two) times daily as needed for anxiety. 09/24/18   Briscoe Deutscher, DO  aspirin 81 MG tablet Take 81 mg by mouth daily.    [provider]  atorvastatin (LIPITOR) 10 MG tablet TAKE 1 TABLET(10 MG) BY MOUTH DAILY 01/17/19   Skeet Latch, MD  budesonide-formoterol [facility]) 80-4.5 MCG/ACT inhaler Inhale 2 puffs into the  lungs 2 (two) times daily. 09/24/18   Briscoe Deutscher, DO  clopidogrel (PLAVIX) 75 MG tablet TAKE 1 TABLET(75 MG) BY MOUTH DAILY 01/17/19   Skeet Latch, MD  diclofenac (VOLTAREN) 75 MG EC tablet TK 1 T PO  BID WITH FOOD PRN 09/29/18   [provider]  furosemide (LASIX) 20 MG tablet TAKE 1 TABLET(20 MG) BY MOUTH DAILY 11/15/18   Briscoe Deutscher, DO  glucose blood test strip Use as instructed 12/27/18   Briscoe Deutscher, DO  INVOKANA 300 MG TABS tablet TAKE 1 TABLET BY MOUTH DAILY 01/24/19   Briscoe Deutscher, DO  ipratropium (ATROVENT) 0.06 % nasal spray Place 2 sprays into both nostrils 4 (four) times daily. 10/18/18   Briscoe Deutscher, DO  ipratropium-albuterol (DUONEB) 0.5-2.5 (3) MG/3ML SOLN USE 3 ML VIA NEBULIZER EVERY 6 HOURS AS NEEDED 07/19/18   Briscoe Deutscher, DO  levothyroxine (SYNTHROID, LEVOTHROID) 112 MCG tablet TAKE 1 TABLET(112 MCG) BY MOUTH DAILY 11/15/18   Briscoe Deutscher, DO  metFORMIN (GLUCOPHAGE) 500 MG tablet TAKE 1 TABLET(500 MG) BY MOUTH DAILY 11/15/18   Briscoe Deutscher, DO  spironolactone (ALDACTONE) 25 MG tablet TAKE 1 TABLET(25 MG) BY MOUTH DAILY 10/18/18   Briscoe Deutscher, DO    Family History Family History  Problem Relation Age of Onset  . Cancer Mother   . Diabetes Son   . Hypertension Son     Social History Social History   Tobacco Use  . Smoking status: Current Every Day Smoker    Packs/day: 0.50    Years: 31.00    Pack years: 15.50  . Smokeless tobacco: Never Used  Substance Use Topics  . Alcohol use: No  . Drug use: No     Allergies   Clindamycin/lincomycin; Codeine; Dilaudid [hydromorphone hcl]; Iodides; Reglan [metoclopramide]; Reglan [metoclopramide]; Tramadol; Dilaudid [hydromorphone hcl]; and Tape   Review of Systems Review of Systems  All other systems reviewed and are negative.    Physical Exam Updated Vital Signs BP (!) 151/128 (BP Location: Right Arm) Comment: Simultaneous filing. User may not have seen previous data.  Pulse 79 Comment:  Simultaneous filing. User may not have seen previous data.  Temp 97.9 F (36.6 C) (Oral)   Resp 10 Comment: Simultaneous filing. User may not have seen previous data.  Ht 1.651 m (5\' 5" )   Wt 125.6 kg   SpO2 97% Comment: Simultaneous filing. User may not have seen previous data.  BMI 46.10 kg/m   Physical Exam Vitals signs and nursing note reviewed.  Constitutional:      General: She is not in acute distress.    Appearance: Normal appearance. She is well-developed. She is not toxic-appearing.  HENT:     Head: Normocephalic and atraumatic.  Eyes:     General: Lids are normal.     Conjunctiva/sclera: Conjunctivae normal.     Pupils: Pupils are equal, round, and reactive to light.  Neck:     Musculoskeletal: Normal range of motion and neck supple.     Thyroid: No thyroid mass.     Trachea: No tracheal deviation.  Cardiovascular:     Rate and Rhythm: Normal rate and regular rhythm.     Heart sounds: Normal heart sounds. No murmur. No gallop.   Pulmonary:     Effort: Pulmonary effort is normal. No respiratory distress.     Breath sounds: Normal breath sounds. No stridor. No decreased breath sounds, wheezing, rhonchi or rales.  Abdominal:     General: Bowel sounds are normal. There is no distension.     Palpations: Abdomen is soft.     Tenderness: There is no abdominal tenderness. There is no rebound.  Musculoskeletal: Normal range of motion.        General: No tenderness.  Skin:    General: Skin is warm and dry.     Findings: No abrasion or rash.  Neurological:     Mental Status: She is alert and oriented to person, place, and time.     GCS: GCS eye subscore is 4. GCS verbal subscore is 5. GCS motor subscore is 6.     Cranial Nerves: No cranial nerve deficit.     Sensory: No sensory deficit.  Psychiatric:        Speech: Speech normal.        Behavior: Behavior normal.      ED Treatments / Results  Labs (all labs ordered are listed, but only abnormal results are  displayed) Labs Reviewed  CBC WITH DIFFERENTIAL/PLATELET  COMPREHENSIVE METABOLIC PANEL  I-STAT TROPONIN, ED    EKG EKG Interpretation  Date/Time:  Tuesday Feb 15 2019 16:36:36 EDT Ventricular Rate:  82 PR Interval:    QRS Duration: 102 QT Interval:  383 QTC Calculation: 448 R Axis:   114 Text Interpretation:  Sinus rhythm Ventricular premature complex Aberrant conduction of SV complex(es) Right axis deviation Low voltage, precordial leads No significant change since last tracing Confirmed by Lacretia Leigh ([id number]) on 02/15/2019 4:44:46 PM   Radiology No results found.  Procedures Procedures (including critical care time)  Medications Ordered in ED Medications  0.9 %  sodium chloride infusion (has no administration in time range)     Initial Impression / Assessment and Plan / ED Course  I have reviewed the triage vital signs and the nursing notes.  Pertinent labs & imaging results that were available during my care of the patient were reviewed by me and considered in my medical decision making (see chart for details).        Patient with negative troponin here.  EKG without acute ischemic changes.  Case discussed with cardiology who recommends medicine admission.  Final Clinical Impressions(s) / ED Diagnoses   Final diagnoses:  None    ED Discharge Orders    None       Lacretia Leigh, MD 02/15/19 1950

## 2019-02-15 NOTE — H&P (Signed)
History and Physical  [name] [name] QPY:[id number] DOB: 1951/03/06 DOA: 02/15/2019  Referring physician: ER physician PCP: Briscoe Deutscher, DO  Outpatient Specialists:    Patient coming from: Home  Chief Complaint: Chest pain  HPI: Patient is a 68 year old Caucasian female, morbidly obese, with past medical history significant for coronary artery disease/myocardial infarction status post PCI placement, tobacco use discontinues, hypertension, hyperlipidemia, diabetes mellitus and hypothyroidism.  Patient presents with mid lower sternal pain that radiates to the inter scapular area, lasts only a few seconds, with no associated nausea, shortness of breath or diaphoresis.  Patient is on diclofenac sodium (NSAIDs) 7 5 Mg p.o. twice daily.  First troponin is less than 0.01, EKG reveals low voltage EKG involving the cardiac leads with no ST-T wave changes.  No headache, no neck pain, no fever or chills, no URI symptoms, no shortness of breath, no nausea or vomiting, no change in bowel habit, no urinary symptoms and no joint pains.  ED Course: Patient's chest pain is said to have responded to nitro according to the ER physician.  On presentation, patient was afebrile, blood pressure peaked at 124 mmHg (systolic blood pressure), with O2 sat of 97%.  Serum creatinine is 1.47 (up from 1.11-1.27), albumin of 3.5, troponin of 0.01, hemoglobin of 16 g/dL and blood sugar of 142.  Pertinent labs: Kindly see above  EKG: Independently reviewed.   Imaging: independently reviewed.  Checks x-ray does not reveal any active disease.  Review of Systems:  Negative for fever, visual changes, sore throat, rash, new muscle aches, SOB, dysuria, bleeding, n/v/abdominal pain.  Past Medical History:  Diagnosis Date  . Bronchitis, asthmatic   . Coronary artery disease    BMS to LAD 02/2008  . Diabetes mellitus without complication ([place])   . Hyperlipidemia   . Hypertensive heart disease 04/16/2016  . Hypothyroidism   . MI,  old   . Morbid obesity ([place]) 04/16/2016  . S/P coronary artery stent placement 04/16/2016   BMS to LAD 2009  . Tobacco abuse 04/16/2016    Past Surgical History:  Procedure Laterality Date  . ABDOMINAL HYSTERECTOMY    . ANKLE SURGERY Right   . CHOLECYSTECTOMY    . CORONARY ANGIOPLASTY  02/2008   bare metal stent to LAD   . CORONARY STENT PLACEMENT    . KNEE ARTHROSCOPY    . ROTATOR CUFF REPAIR Bilateral   . THORACIC OUTLET SURGERY    . WRIST SURGERY       reports that she has been smoking. She has a 15.50 pack-year smoking history. She has never used smokeless tobacco. She reports that she does not drink alcohol or use drugs.  Allergies  Allergen Reactions  . Clindamycin/Lincomycin Other (See Comments)    Head ache   . Codeine Hives  . Dilaudid [Hydromorphone Hcl] Nausea And Vomiting  . Iodides   . Reglan [Metoclopramide] Other (See Comments)    Other reaction(s): Dystonia Hallucinations/Violent  . Tramadol   . Tape Rash    Blisters     Family History  Problem Relation Age of Onset  . Cancer Mother   . Diabetes Son   . Hypertension Son      Prior to Admission medications   Medication Sig Start Date End Date Taking? Authorizing Provider  albuterol (ACCUNEB) 1.25 MG/3ML nebulizer solution Take 3 mLs (1.25 mg total) by nebulization 3 (three) times daily as needed for wheezing. 07/09/18  Yes Briscoe Deutscher, DO  albuterol (PROAIR HFA) 108 [phone] Base) MCG/ACT inhaler Inhale  2 puffs into the lungs every 6 (six) hours as needed for wheezing or shortness of breath. 10/18/18  Yes Briscoe Deutscher, DO  ALPRAZolam Duanne Moron) 0.5 MG tablet Take 1 tablet (0.5 mg total) by mouth 2 (two) times daily as needed for anxiety. 09/24/18  Yes Briscoe Deutscher, DO  aspirin 81 MG tablet Take 81 mg by mouth daily.   Yes [provider]  atorvastatin (LIPITOR) 10 MG tablet TAKE 1 TABLET(10 MG) BY MOUTH DAILY Patient taking differently: Take 10 mg by mouth daily.  01/17/19  Yes Skeet Latch,  MD  budesonide-formoterol [facility]) 80-4.5 MCG/ACT inhaler Inhale 2 puffs into the lungs 2 (two) times daily. 09/24/18  Yes Briscoe Deutscher, DO  clopidogrel (PLAVIX) 75 MG tablet TAKE 1 TABLET(75 MG) BY MOUTH DAILY Patient taking differently: Take 75 mg by mouth daily.  01/17/19  Yes Skeet Latch, MD  diclofenac (VOLTAREN) 75 MG EC tablet Take 75 mg by mouth 2 (two) times daily.  09/29/18  Yes [provider]  furosemide (LASIX) 20 MG tablet TAKE 1 TABLET(20 MG) BY MOUTH DAILY Patient taking differently: Take 20 mg by mouth daily.  11/15/18  Yes Briscoe Deutscher, DO  INVOKANA 300 MG TABS tablet TAKE 1 TABLET BY MOUTH DAILY Patient taking differently: Take 300 mg by mouth daily.  01/24/19  Yes Briscoe Deutscher, DO  ipratropium (ATROVENT) 0.06 % nasal spray Place 2 sprays into both nostrils 4 (four) times daily. 10/18/18  Yes Briscoe Deutscher, DO  levothyroxine (SYNTHROID, LEVOTHROID) 112 MCG tablet TAKE 1 TABLET(112 MCG) BY MOUTH DAILY Patient taking differently: Take 112 mcg by mouth daily.  11/15/18  Yes Briscoe Deutscher, DO  metFORMIN (GLUCOPHAGE) 500 MG tablet TAKE 1 TABLET(500 MG) BY MOUTH DAILY Patient taking differently: Take 500 mg by mouth daily.  11/15/18  Yes Briscoe Deutscher, DO  spironolactone (ALDACTONE) 25 MG tablet TAKE 1 TABLET(25 MG) BY MOUTH DAILY Patient taking differently: Take 25 mg by mouth daily.  10/18/18  Yes Briscoe Deutscher, DO  glucose blood test strip Use as instructed 12/27/18   Briscoe Deutscher, DO    Physical Exam: Vitals:   02/15/19 1745 02/15/19 1800 02/15/19 1900 02/15/19 1915  BP: 113/66 (!) 96/56 121/65 104/60  Pulse: 70 69 68 70  Resp: 16 18 16 17   Temp:      TempSrc:      SpO2: 98% 97% 96% 97%  Weight:      Height:        Constitutional:  . Appears calm and comfortable.  Patient is morbidly obese Eyes:  . No pallor. No jaundice.  ENMT:  . external ears, nose appear normal Neck:  . Neck is supple. No JVD Respiratory:  . CTA bilaterally, no w/r/r.  .  Respiratory effort normal. No retractions or accessory muscle use Cardiovascular:  . S1S2 . No LE extremity edema   Abdomen:  . Abdomen is morbidly obese, soft and non tender. Organs are difficult to assess. Neurologic:  . Awake and alert. . Moves all limbs.  Wt Readings from Last 3 Encounters:  02/15/19 125.6 kg  01/24/19 130.2 kg  01/21/19 127.9 kg    I have personally reviewed following labs and imaging studies  Labs on Admission:  CBC: Recent Labs  Lab 02/15/19 1647  WBC 9.1  NEUTROABS 5.4  HGB 16.0*  HCT 49.1*  MCV 96.8  PLT 962   Basic Metabolic Panel: Recent Labs  Lab 02/15/19 1647  NA 141  K 4.0  CL 104  CO2 25  GLUCOSE  142*  BUN 22  CREATININE 1.47*  CALCIUM 9.7   Liver Function Tests: Recent Labs  Lab 02/15/19 1647  AST 19  ALT 24  ALKPHOS 75  BILITOT 0.6  PROT 6.3*  ALBUMIN 3.5   No results for input(s): LIPASE, AMYLASE in the last 168 hours. No results for input(s): AMMONIA in the last 168 hours. Coagulation Profile: No results for input(s): INR, PROTIME in the last 168 hours. Cardiac Enzymes: No results for input(s): CKTOTAL, CKMB, CKMBINDEX, TROPONINI in the last 168 hours. BNP (last 3 results) No results for input(s): PROBNP in the last 8760 hours. HbA1C: No results for input(s): HGBA1C in the last 72 hours. CBG: No results for input(s): GLUCAP in the last 168 hours. Lipid Profile: No results for input(s): CHOL, HDL, LDLCALC, TRIG, CHOLHDL, LDLDIRECT in the last 72 hours. Thyroid Function Tests: No results for input(s): TSH, T4TOTAL, FREET4, T3FREE, THYROIDAB in the last 72 hours. Anemia Panel: No results for input(s): VITAMINB12, FOLATE, FERRITIN, TIBC, IRON, RETICCTPCT in the last 72 hours. Urine analysis:    Component Value Date/Time   COLORURINE YELLOW 04/16/2018 1024   APPEARANCEUR CLEAR 04/16/2018 1024   LABSPEC <=1.005 (A) 04/16/2018 1024   PHURINE 6.0 04/16/2018 1024   GLUCOSEU >=1000 (A) 04/16/2018 1024   HGBUR  NEGATIVE 04/16/2018 1024   BILIRUBINUR NEGATIVE 04/16/2018 1024   KETONESUR NEGATIVE 04/16/2018 1024   UROBILINOGEN 0.2 04/16/2018 1024   NITRITE NEGATIVE 04/16/2018 1024   LEUKOCYTESUR NEGATIVE 04/16/2018 1024   Sepsis Labs: @LABRCNTIP (procalcitonin:4,lacticidven:4) ) Recent Results (from the past 240 hour(s))  SARS Coronavirus 2 (CEPHEID - Performed in [place] hospital lab), Hosp Order     Status: None   Collection Time: 02/15/19  5:12 PM  Result Value Ref Range Status   SARS Coronavirus 2 NEGATIVE NEGATIVE Final    Comment: (NOTE) If result is NEGATIVE SARS-CoV-2 target nucleic acids are NOT DETECTED. The SARS-CoV-2 RNA is generally detectable in upper and lower  respiratory specimens during the acute phase of infection. The lowest  concentration of SARS-CoV-2 viral copies this assay can detect is 250  copies / mL. A negative result does not preclude SARS-CoV-2 infection  and should not be used as the sole basis for treatment or other  patient management decisions.  A negative result may occur with  improper specimen collection / handling, submission of specimen other  than nasopharyngeal swab, presence of viral mutation(s) within the  areas targeted by this assay, and inadequate number of viral copies  (<250 copies / mL). A negative result must be combined with clinical  observations, patient history, and epidemiological information. If result is POSITIVE SARS-CoV-2 target nucleic acids are DETECTED. The SARS-CoV-2 RNA is generally detectable in upper and lower  respiratory specimens dur ing the acute phase of infection.  Positive  results are indicative of active infection with SARS-CoV-2.  Clinical  correlation with patient history and other diagnostic information is  necessary to determine patient infection status.  Positive results do  not rule out bacterial infection or co-infection with other viruses. If result is PRESUMPTIVE POSTIVE SARS-CoV-2 nucleic acids MAY  BE PRESENT.   A presumptive positive result was obtained on the submitted specimen  and confirmed on repeat testing.  While 2019 novel coronavirus  (SARS-CoV-2) nucleic acids may be present in the submitted sample  additional confirmatory testing may be necessary for epidemiological  and / or clinical management purposes  to differentiate between  SARS-CoV-2 and other Sarbecovirus currently known to infect humans.  If  clinically indicated additional testing with an alternate test  methodology [phone]) is advised. The SARS-CoV-2 RNA is generally  detectable in upper and lower respiratory sp ecimens during the acute  phase of infection. The expected result is Negative. Fact Sheet for Patients:  StrictlyIdeas.no Fact Sheet for Healthcare Providers: BankingDealers.co.za This test is not yet approved or cleared by the [place] FDA and has been authorized for detection and/or diagnosis of SARS-CoV-2 by FDA under an Emergency Use Authorization (EUA).  This EUA will remain in effect (meaning this test can be used) for the duration of the COVID-19 declaration under Section 564(b)(1) of the Act, 21 U.S.C. section 360bbb-3(b)(1), unless the authorization is terminated or revoked sooner. Performed at [facility], [place] [address], [place], [place] [address]       Radiological Exams on Admission: Dg Chest Port 1 View  Result Date: 02/15/2019 CLINICAL DATA:  Chest pain. EXAM: PORTABLE CHEST 1 VIEW COMPARISON:  Chest x-ray dated March 15, 2018. FINDINGS: The heart remains at the upper limits of normal in size. Normal mediastinal contours. Normal pulmonary vascularity. No focal consolidation, pleural effusion, or pneumothorax. No acute osseous abnormality. Unchanged surgical clips in the right neck. IMPRESSION: No active disease. Electronically Signed   By: Titus Dubin M.D.   On: 02/15/2019 17:57    EKG: Independently reviewed.   Active  Problems:   * No active hospital problems. *   Assessment/Plan Chest pain: Admit patient for observation Cycle cardiac enzymes ER provider has already consulted the cardiology team Discontinue diclofenac sodium (NSAIDs) as etiology of chest pain could be GI. Rule out possible dissection (but very low likelihood) Discussed with ER provider, CT of the chest will be ordered considering patient's history Further management will depend on hospital course.  Tobacco use: Counseled to quit.  Hypertension: Currently controlled.  Systolic blood pressure is 104 mmHg. Continue to optimize.  Diabetes mellitus: Optimize. Sliding scale insulin coverage  Morbid obesity: Diet and exercise. Assess for possible OSA/OHS on outpatient basis  Hyperlipidemia: Optimize. Continue current medications. Check lipid profile  Further management will depend on hospital course.  DVT prophylaxis: Subcut Lovenox Code Status: Full code Family Communication:  Disposition Plan: Home eventually Consults called: ER provider has already called cardiology team Admission status: Patient  Time spent: 65 minutes  Dana Allan, MD  [facility] Pager #: [phone] 7PM-7AM contact night coverage as above  02/15/2019, 7:53 PM

## 2019-02-15 NOTE — Telephone Encounter (Signed)
See note

## 2019-02-15 NOTE — ED Notes (Signed)
Nurse Navigator communication: Husband of the patient is going to go home. He needs to be called on his daughters cell phone to give him an update on plan of care. Please give the daughters cell to the patient, shes forgotten it.   [name] [name] (daughter of patient) [phone]

## 2019-02-15 NOTE — Telephone Encounter (Signed)
Pt in the ED

## 2019-02-15 NOTE — ED Triage Notes (Signed)
Pt arrives via EMS from home with reports of CP starting at 10 am. Central CP that radiates in shoulder blades. Endorses some dizziness when standing. Hx of MI with stents. Some relief with 1 nitro. 324 ASA taken.

## 2019-02-15 NOTE — ED Notes (Signed)
ED TO INPATIENT HANDOFF REPORT  ED Nurse Name and Phone #: Dhanvin Szeto [phone]  S Name/Age/Gender [name] [name] 68 y.o. female Room/Bed: 019C/019C  Code Status   Code Status: Not on file  Home/SNF/Other Home Patient oriented to: self, place, time and situation Is this baseline? Yes   Triage Complete: Triage complete  Chief Complaint chest pain  Triage Note Pt arrives via EMS from home with reports of CP starting at 10 am. Central CP that radiates in shoulder blades. Endorses some dizziness when standing. Hx of MI with stents. Some relief with 1 nitro. 324 ASA taken.    Allergies Allergies  Allergen Reactions  . Clindamycin/Lincomycin Other (See Comments)    Head ache   . Codeine Hives  . Dilaudid [Hydromorphone Hcl] Nausea And Vomiting  . Iodides   . Reglan [Metoclopramide] Other (See Comments)    Other reaction(s): Dystonia Hallucinations/Violent  . Tramadol   . Tape Rash    Blisters     Level of Care/Admitting Diagnosis ED Disposition    ED Disposition Condition [facility] Area: [place] [[id number]]  Level of Care: Telemetry Cardiac [103]  I expect the patient will be discharged within 24 hours: No (not a candidate for 5C-Observation unit)  Covid Evaluation: N/A  Diagnosis: Chest pain [937902]  Admitting Physician: Bonnell Public [[id number]]  Attending Physician: Dana Allan I [[id number]]  PT Class (Do Not Modify): Observation [104]  PT Acc Code (Do Not Modify): Observation [10022]       B Medical/Surgery History Past Medical History:  Diagnosis Date  . Bronchitis, asthmatic   . Coronary artery disease    BMS to LAD 02/2008  . Diabetes mellitus without complication ([place])   . Hyperlipidemia   . Hypertensive heart disease 04/16/2016  . Hypothyroidism   . MI, old   . Morbid obesity ([place]) 04/16/2016  . S/P coronary artery stent placement 04/16/2016   BMS to LAD 2009  . Tobacco abuse 04/16/2016   Past Surgical History:   Procedure Laterality Date  . ABDOMINAL HYSTERECTOMY    . ANKLE SURGERY Right   . CHOLECYSTECTOMY    . CORONARY ANGIOPLASTY  02/2008   bare metal stent to LAD   . CORONARY STENT PLACEMENT    . KNEE ARTHROSCOPY    . ROTATOR CUFF REPAIR Bilateral   . THORACIC OUTLET SURGERY    . WRIST SURGERY       A IV Location/Drains/Wounds Patient Lines/Drains/Airways Status   Active Line/Drains/Airways    Name:   Placement date:   Placement time:   Site:   Days:   Peripheral IV 02/15/19 Right Antecubital   02/15/19    1706    Antecubital   less than 1          Intake/Output Last 24 hours  Intake/Output Summary (Last 24 hours) at 02/15/2019 2036 Last data filed at 02/15/2019 1821 Gross per 24 hour  Intake 1000 ml  Output -  Net 1000 ml    Labs/Imaging Results for orders placed or performed during the hospital encounter of 02/15/19 (from the past 48 hour(s))  CBC with Differential/Platelet     Status: Abnormal   Collection Time: 02/15/19  4:47 PM  Result Value Ref Range   WBC 9.1 4.0 - 10.5 K/uL   RBC 5.07 3.87 - 5.11 MIL/uL   Hemoglobin 16.0 (H) 12.0 - 15.0 g/dL   HCT 49.1 (H) 36.0 - 46.0 %   MCV 96.8 80.0 - 100.0 fL  MCH 31.6 26.0 - 34.0 pg   MCHC 32.6 30.0 - 36.0 g/dL   RDW 13.8 11.5 - 15.5 %   Platelets 182 150 - 400 K/uL   nRBC 0.0 0.0 - 0.2 %   Neutrophils Relative % 59 %   Neutro Abs 5.4 1.7 - 7.7 K/uL   Lymphocytes Relative 31 %   Lymphs Abs 2.8 0.7 - 4.0 K/uL   Monocytes Relative 7 %   Monocytes Absolute 0.6 0.1 - 1.0 K/uL   Eosinophils Relative 2 %   Eosinophils Absolute 0.2 0.0 - 0.5 K/uL   Basophils Relative 1 %   Basophils Absolute 0.1 0.0 - 0.1 K/uL   Immature Granulocytes 0 %   Abs Immature Granulocytes 0.03 0.00 - 0.07 K/uL    Comment: Performed at [facility] [address] [place], [place] [address]  Comprehensive metabolic panel     Status: Abnormal   Collection Time: 02/15/19  4:47 PM  Result Value Ref Range   Sodium 141 135 - 145 mmol/L    Potassium 4.0 3.5 - 5.1 mmol/L   Chloride 104 98 - 111 mmol/L   CO2 25 22 - 32 mmol/L   Glucose, Bld 142 (H) 70 - 99 mg/dL   BUN 22 8 - 23 mg/dL   Creatinine, Ser 1.47 (H) 0.44 - 1.00 mg/dL   Calcium 9.7 8.9 - 10.3 mg/dL   Total Protein 6.3 (L) 6.5 - 8.1 g/dL   Albumin 3.5 3.5 - 5.0 g/dL   AST 19 15 - 41 U/L   ALT 24 0 - 44 U/L   Alkaline Phosphatase 75 38 - 126 U/L   Total Bilirubin 0.6 0.3 - 1.2 mg/dL   GFR calc non Af Amer 37 (L) >60 mL/min   GFR calc Af Amer 43 (L) >60 mL/min   Anion gap 12 5 - 15    Comment: Performed at [facility] [address] [place], [place] [address]  SARS Coronavirus 2 (CEPHEID - Performed in [place] hospital lab), Hosp Order     Status: None   Collection Time: 02/15/19  5:12 PM  Result Value Ref Range   SARS Coronavirus 2 NEGATIVE NEGATIVE    Comment: (NOTE) If result is NEGATIVE SARS-CoV-2 target nucleic acids are NOT DETECTED. The SARS-CoV-2 RNA is generally detectable in upper and lower  respiratory specimens during the acute phase of infection. The lowest  concentration of SARS-CoV-2 viral copies this assay can detect is 250  copies / mL. A negative result does not preclude SARS-CoV-2 infection  and should not be used as the sole basis for treatment or other  patient management decisions.  A negative result may occur with  improper specimen collection / handling, submission of specimen other  than nasopharyngeal swab, presence of viral mutation(s) within the  areas targeted by this assay, and inadequate number of viral copies  (<250 copies / mL). A negative result must be combined with clinical  observations, patient history, and epidemiological information. If result is POSITIVE SARS-CoV-2 target nucleic acids are DETECTED. The SARS-CoV-2 RNA is generally detectable in upper and lower  respiratory specimens dur ing the acute phase of infection.  Positive  results are indicative of active infection with SARS-CoV-2.  Clinical   correlation with patient history and other diagnostic information is  necessary to determine patient infection status.  Positive results do  not rule out bacterial infection or co-infection with other viruses. If result is PRESUMPTIVE POSTIVE SARS-CoV-2 nucleic acids MAY  BE PRESENT.   A presumptive positive result was obtained on the submitted specimen  and confirmed on repeat testing.  While 2019 novel coronavirus  (SARS-CoV-2) nucleic acids may be present in the submitted sample  additional confirmatory testing may be necessary for epidemiological  and / or clinical management purposes  to differentiate between  SARS-CoV-2 and other Sarbecovirus currently known to infect humans.  If clinically indicated additional testing with an alternate test  methodology [phone]) is advised. The SARS-CoV-2 RNA is generally  detectable in upper and lower respiratory sp ecimens during the acute  phase of infection. The expected result is Negative. Fact Sheet for Patients:  StrictlyIdeas.no Fact Sheet for Healthcare Providers: BankingDealers.co.za This test is not yet approved or cleared by the [place] FDA and has been authorized for detection and/or diagnosis of SARS-CoV-2 by FDA under an Emergency Use Authorization (EUA).  This EUA will remain in effect (meaning this test can be used) for the duration of the COVID-19 declaration under Section 564(b)(1) of the Act, 21 U.S.C. section 360bbb-3(b)(1), unless the authorization is terminated or revoked sooner. Performed at [facility], [place] [address], [place], [place] [address]   I-stat troponin, ED     Status: None   Collection Time: 02/15/19  5:16 PM  Result Value Ref Range   Troponin i, poc 0.01 0.00 - 0.08 ng/mL   Comment 3            Comment: Due to the release kinetics of cTnI, a negative result within the first hours of the onset of symptoms does not rule out myocardial  infarction with certainty. If myocardial infarction is still suspected, repeat the test at appropriate intervals.    Dg Chest Port 1 View  Result Date: 02/15/2019 CLINICAL DATA:  Chest pain. EXAM: PORTABLE CHEST 1 VIEW COMPARISON:  Chest x-ray dated March 15, 2018. FINDINGS: The heart remains at the upper limits of normal in size. Normal mediastinal contours. Normal pulmonary vascularity. No focal consolidation, pleural effusion, or pneumothorax. No acute osseous abnormality. Unchanged surgical clips in the right neck. IMPRESSION: No active disease. Electronically Signed   By: Titus Dubin M.D.   On: 02/15/2019 17:57    Pending Labs [employer] (From admission, onward)    Start     Ordered   Signed and Held  HIV antibody (Routine Testing)  Once,   R     Signed and Held   Signed and Held  CBC  (enoxaparin (LOVENOX)    CrCl >/= 30 ml/min)  Once,   R    Comments:  Baseline for enoxaparin therapy IF NOT ALREADY DRAWN.  Notify MD if PLT < 100 K.    Signed and Held   Signed and Held  Creatinine, serum  (enoxaparin (LOVENOX)    CrCl >/= 30 ml/min)  Once,   R    Comments:  Baseline for enoxaparin therapy IF NOT ALREADY DRAWN.    Signed and Held   Signed and Held  Creatinine, serum  (enoxaparin (LOVENOX)    CrCl >/= 30 ml/min)  Weekly,   R    Comments:  while on enoxaparin therapy    Signed and Held   Signed and Held  Magnesium  Once,   R     Signed and Held   Signed and Occupational hygienist morning,   R     Signed and Held   Signed and Held  CBC  Tomorrow morning,   R  Signed and Held   Signed and Held  Troponin I - Once  Once,   R     Signed and Held   Signed and Held  Lipid panel  Once,   R     Signed and Held          Vitals/Pain Today's Vitals   02/15/19 1915 02/15/19 1927 02/15/19 1945 02/15/19 2000  BP: 104/60  120/63 (!) 114/92  Pulse: 70  76 71  Resp: 17  16 (!) 21  Temp:      TempSrc:      SpO2: 97%  94% 99%  Weight:      Height:       PainSc:  0-No pain      Isolation Precautions No active isolations  Medications Medications  0.9 %  sodium chloride infusion ( Intravenous Stopped 02/15/19 1821)    Mobility walks Low fall risk   Focused Assessments Cardiac Assessment Handoff:  Cardiac Rhythm: Normal sinus rhythm Lab Results  Component Value Date   TROPONINI <0.03 02/29/2016   No results found for: DDIMER Does the Patient currently have chest pain? No     R Recommendations: See Admitting Provider Note  Report given to:   Additional Notes:

## 2019-02-15 NOTE — Telephone Encounter (Signed)
Pt called in c/o chest pressure between her breasts radiating into her back between her shoulder blades since 10:00 AM today.   It feels like the angina I had 10 yrs ago and ended up getting a stent in my heart.  See triage notes.  She is going to call EMS to take her to [facility].   She asked me if it was safe to go to the hospital with all the coronavirus pandemic.   I let her know they keep the COVID-19 pts separated from the other pts and it would be more harmful for her to not go to the ED with her cardiac history and her symptoms.  I sent these triage notes to Dr. Juleen China at the [facility] office so she would be aware.  Reason for Disposition . [1] Chest pain lasts > 5 minutes AND [2] history of heart disease  (i.e., heart attack, bypass surgery, angina, angioplasty, CHF; not just a heart murmur)    Had angina and a stent inserted 10 yrs ago.  Answer Assessment - Initial Assessment Questions 1. LOCATION: "Where does it hurt?"       I feel pressure between my breasts and between my shoulders in the back.   I've had angina 10 yrs ago.   Got a stent 10 yrs ago. 2. RADIATION: "Does the pain go anywhere else?" (e.g., into neck, jaw, arms, back)     Into my back between shoulder blades. 3. ONSET: "When did the chest pain begin?" (Minutes, hours or days)      It started while I was sitting in the car waiting for my husband while he was in [place].    Since 10:00AM today 4. PATTERN "Does the pain come and go, or has it been constant since it started?"  "Does it get worse with exertion?"      intermittent 5. DURATION: "How long does it last" (e.g., seconds, minutes, hours)     Se above 6. SEVERITY: "How bad is the pain?"  (e.g., Scale 1-10; mild, moderate, or severe)    - MILD (1-3): doesn't interfere with normal activities     - MODERATE (4-7): interferes with normal activities or awakens from sleep    - SEVERE (8-10): excruciating pain, unable to do any normal activities        Mild7. CARDIAC RISK FACTORS: "Do you have any history of heart problems or risk factors for heart disease?" (e.g., prior heart attack, angina; high blood pressure, diabetes, being overweight, high cholesterol, smoking, or strong family history of heart disease)     Had a stent put in 10 yrs ago because she was having angina.   This feels the same. 8. PULMONARY RISK FACTORS: "Do you have any history of lung disease?"  (e.g., blood clots in lung, asthma, emphysema, birth control pills)     See above 9. CAUSE: "What do you think is causing the chest pain?"     Maybe angina.   It feels like it did 10 yrs ago when I had angina and had to have a stent put in my heart. 10. OTHER SYMPTOMS: "Do you have any other symptoms?" (e.g., dizziness, nausea, vomiting, sweating, fever, difficulty breathing, cough)       Did not ask.   Referred her on to the ED.   She has been having this pressure since 10:00 AM today.  She is going to call the EMS to take her to [place] [facility]: "Is there any chance you  are pregnant?" "When was your last menstrual period?"       N/A  Protocols used: CHEST PAIN-A-AH

## 2019-02-16 ENCOUNTER — Other Ambulatory Visit: Payer: Self-pay | Admitting: Student

## 2019-02-16 ENCOUNTER — Encounter (HOSPITAL_COMMUNITY): Payer: Self-pay

## 2019-02-16 DIAGNOSIS — F17200 Nicotine dependence, unspecified, uncomplicated: Secondary | ICD-10-CM

## 2019-02-16 DIAGNOSIS — E785 Hyperlipidemia, unspecified: Secondary | ICD-10-CM

## 2019-02-16 DIAGNOSIS — R072 Precordial pain: Secondary | ICD-10-CM | POA: Diagnosis not present

## 2019-02-16 DIAGNOSIS — E1069 Type 1 diabetes mellitus with other specified complication: Secondary | ICD-10-CM | POA: Diagnosis not present

## 2019-02-16 DIAGNOSIS — I10 Essential (primary) hypertension: Secondary | ICD-10-CM | POA: Diagnosis not present

## 2019-02-16 DIAGNOSIS — R079 Chest pain, unspecified: Secondary | ICD-10-CM

## 2019-02-16 LAB — CBC
HCT: 45.6 % (ref 36.0–46.0)
Hemoglobin: 15 g/dL (ref 12.0–15.0)
MCH: 31.9 pg (ref 26.0–34.0)
MCHC: 32.9 g/dL (ref 30.0–36.0)
MCV: 97 fL (ref 80.0–100.0)
Platelets: 160 10*3/uL (ref 150–400)
RBC: 4.7 MIL/uL (ref 3.87–5.11)
RDW: 14 % (ref 11.5–15.5)
WBC: 8.2 10*3/uL (ref 4.0–10.5)
nRBC: 0 % (ref 0.0–0.2)

## 2019-02-16 LAB — BASIC METABOLIC PANEL
Anion gap: 9 (ref 5–15)
BUN: 22 mg/dL (ref 8–23)
CO2: 26 mmol/L (ref 22–32)
Calcium: 9 mg/dL (ref 8.9–10.3)
Chloride: 104 mmol/L (ref 98–111)
Creatinine, Ser: 1.34 mg/dL — ABNORMAL HIGH (ref 0.44–1.00)
GFR calc Af Amer: 48 mL/min — ABNORMAL LOW (ref >60)
GFR calc non Af Amer: 41 mL/min — ABNORMAL LOW (ref >60)
Glucose, Bld: 146 mg/dL — ABNORMAL HIGH (ref 70–99)
Potassium: 3.6 mmol/L (ref 3.5–5.1)
Sodium: 139 mmol/L (ref 135–145)

## 2019-02-16 LAB — GLUCOSE, CAPILLARY
Glucose-Capillary: 109 mg/dL — ABNORMAL HIGH (ref 70–99)
Glucose-Capillary: 132 mg/dL — ABNORMAL HIGH (ref 70–99)

## 2019-02-16 LAB — TROPONIN I: Troponin I: <0.03 ng/mL (ref <0.03)

## 2019-02-16 LAB — HIV ANTIBODY (ROUTINE TESTING W REFLEX): HIV Screen 4th Generation wRfx: NONREACTIVE

## 2019-02-16 MED ORDER — DICLOFENAC SODIUM 75 MG PO TBEC
75.0000 mg | DELAYED_RELEASE_TABLET | Freq: Two times a day (BID) | ORAL | Status: DC
  Administered 2019-02-16: 75 mg via ORAL
  Filled 2019-02-16 (×3): qty 1

## 2019-02-16 NOTE — Progress Notes (Signed)
error 

## 2019-02-16 NOTE — Consult Note (Addendum)
Cardiology Consult    Patient ID: [name] [name] MRN: [id number], DOB/AGE: [birth year]   Admit date: 02/15/2019 Date of Consult: 02/16/2019  Primary Physician: Briscoe Deutscher, DO Primary Cardiologist: Skeet Latch, MD Requesting Provider: Nicolette Bang, MD  Patient Profile    [name] [name] is a 68 y.o. female with a history of CAD s/p BMS to LAD in 2009 (in [place]), COPD, hypertension, hyperlipidemia, diabetes mellitus, hypothyroidism, CKD stage II, polycythemia, obesity, and tobacco use, who is being seen today for the evaluation of chest pain at the request of Dr. Wyonia Hough.  History of Present Illness    [name] [name] is a 68 year old female with the above history who is followed by Dr. Oval Linsey. Last ischemic evaluation was a Myoview in 2014 which was normal. In February 2020, patient asked to be taken off her Metoprolol due to side effects. She said it was making her BP too low and she was reportedly symptomatic with this. This was approved by Dr. Oval Linsey. Patient was last seen by Kerin Ransom, PA-C, for a telehealth visit on 01/21/2019 at which time she was doing well from a cardiac standpoint. Patient reported her BP was usually in the 110's/70's off of the Metoprolol. Patient had lost about 40 lbs in the past 9 months. Patient states she has been in her usual state of health recently until yesterday morning when she developed chest pain.  Patient presented to the ED yesterday via EMS for evaluation of chest pain. Patient reports onset of substernal chest pressure that radiated to her back between her shoulder yesterday morning. No associated diaphoresis, shortness of breath, nausea, vomiting, palpitation, lightheadedness/dizziness/ or syncope. Patient initially though she was having an "angina attack" but now thinking back she states she thinks she may have just strained a muscle. The day prior to the onset of pain, patient states she tripped on a rug in her home and almost felt but  caught her self in a nearby doorway. She states she got twisted during this and thinks she may have strained something. Pain non-exertional and worse with movement. Patient denies any recent chest pain prior to this. Patient has an occasional smoker's cough but denies any recent fevers, chills, or illnesses.   Chest pain yesterday reached an 8/10 on the pain scale so patient called 911. EMS reportedly gave patient Aspirin and sublingual Nitroglycerin and pain improved to 5/10.   In the ED: BP soft with systolic BP in the 56'E at times but vitals stable. EKG showed no acute ischemic changes. I-stat troponin negative. Chest x-ray showed no acute findings. CTA was negative for aortic dissection or aneurysm but did show multiple scattered pulmonary nodules up to 0.5cm. WBC 9.1, Hgb 16.0, Plts 182. Na 141, K 4.0, Glucose 142, SCr 1.47. COVID-19 testing negative. Patient admitted for ACS rule out.  At the time of this evaluation, patient reports intermittent chest pain at rest throughout the day but states it is very mild (3/10 on the pain scale). Pain is worse with movement. There is chest wall tenderness on exam but patient said this is a different type of pain.  Patient has 30 year smoking history and currently smokes 1/2 pack per day. She denies any alcohol use or illicit drug use. Patient does have a family history of CAD with her mother requiring a CABG.  Past Medical History   Past Medical History:  Diagnosis Date   Bronchitis, asthmatic    Coronary artery disease    BMS to LAD 02/2008  Diabetes mellitus without complication ([place])    Hyperlipidemia    Hypertensive heart disease 04/16/2016   Hypothyroidism    MI, old    Morbid obesity ([place]) 04/16/2016   S/P coronary artery stent placement 04/16/2016   BMS to LAD 2009   Tobacco abuse 04/16/2016    Past Surgical History:  Procedure Laterality Date   ABDOMINAL HYSTERECTOMY     ANKLE SURGERY Right    CHOLECYSTECTOMY     CORONARY  ANGIOPLASTY  02/2008   bare metal stent to LAD    CORONARY STENT PLACEMENT     KNEE ARTHROSCOPY     ROTATOR CUFF REPAIR Bilateral    THORACIC OUTLET SURGERY     WRIST SURGERY       Allergies  Allergies  Allergen Reactions   Clindamycin/Lincomycin Other (See Comments)    Head ache    Codeine Hives   Dilaudid [Hydromorphone Hcl] Nausea And Vomiting   Iodides    Reglan [Metoclopramide] Other (See Comments)    Other reaction(s): Dystonia Hallucinations/Violent   Tramadol    Tape Rash    Blisters     Inpatient Medications     albuterol  2.5 mg Nebulization Q2000   aspirin EC  81 mg Oral Daily   atorvastatin  10 mg Oral Daily   clopidogrel  75 mg Oral Daily   diclofenac  75 mg Oral BID   enoxaparin (LOVENOX) injection  40 mg Subcutaneous Q24H   insulin aspart  0-5 Units Subcutaneous QHS   insulin aspart  0-9 Units Subcutaneous TID WC   levothyroxine  112 mcg Oral Daily   mometasone-formoterol  2 puff Inhalation BID   nicotine  14 mg Transdermal Daily   pantoprazole  40 mg Oral BID   spironolactone  25 mg Oral Daily    Family History    Family History  Problem Relation Age of Onset   Cancer Mother    Diabetes Son    Hypertension Son    She indicated that her mother is deceased. She indicated that her father is deceased. She indicated that both of her brothers are alive. She indicated that her maternal grandmother is deceased. She indicated that her maternal grandfather is deceased. She indicated that her paternal grandmother is deceased. She indicated that her paternal grandfather is deceased. She indicated that her daughter is alive. She indicated that her son is alive.   Social History    Social History   Socioeconomic History   Marital status: Married    Spouse name: Not on file   Number of children: Not on file   Years of education: Not on file   Highest education level: Not on file  Occupational History   Not on file    Social Needs   Financial resource strain: Not on file   Food insecurity:    Worry: Not on file    Inability: Not on file   Transportation needs:    Medical: Not on file    Non-medical: Not on file  Tobacco Use   Smoking status: Current Every Day Smoker    Packs/day: 0.50    Years: 31.00    Pack years: 15.50   Smokeless tobacco: Never Used  Substance and Sexual Activity   Alcohol use: No   Drug use: No   Sexual activity: Yes    Partners: Male  Lifestyle   Physical activity:    Days per week: Not on file    Minutes per session: Not on file  Stress: Not on file  Relationships   Social connections:    Talks on phone: Not on file    Gets together: Not on file    Attends religious service: Not on file    Active member of club or organization: Not on file    Attends meetings of clubs or organizations: Not on file    Relationship status: Not on file   Intimate partner violence:    Fear of current or ex partner: Not on file    Emotionally abused: Not on file    Physically abused: Not on file    Forced sexual activity: Not on file  Other Topics Concern   Not on file  Social History Narrative         Review of Systems    Review of Systems  Constitutional: Negative for chills, diaphoresis and fever.  HENT: Negative for congestion and sore throat.   Respiratory: Positive for cough and sputum production. Negative for hemoptysis and shortness of breath.   Cardiovascular: Positive for chest pain and leg swelling. Negative for palpitations.  Gastrointestinal: Negative for abdominal pain, blood in stool, nausea and vomiting.  Genitourinary: Negative for hematuria.  Musculoskeletal: Positive for back pain. Negative for falls and myalgias.  Neurological: Negative for dizziness and loss of consciousness.  Endo/Heme/Allergies: Does not bruise/bleed easily.  Psychiatric/Behavioral: Positive for substance abuse (tobacco use).    Physical Exam    Blood pressure  118/71, pulse 66, temperature (!) 97.5 F (36.4 C), temperature source Oral, resp. rate 16, height 5\' 5"  (1.651 m), weight 130.1 kg, SpO2 97 %.  General: 68 y.o. female resting comfortably in no acute distress. Pleasant and cooperative. HEENT: Normal  Neck: Supple.  Lungs: No increased work of breathing. Clear to auscultation bilaterally. No wheezes, rhonchi, or rales. Chest wall tenderness with palpation to the left of sternum. Heart: RRR. Distinct S1 and S2. No murmurs, gallops, or rubs.  Abdomen: Soft, obese, and non-tender to palpation. Bowel sounds present in all 4 quadrants.   Extremities: No clubbing, cyanosis or edema. Radial and distal pedal pulses 2+ and equal bilaterally. Skin: Warm and dry. Neuro: Alert and oriented x3. No focal deficits. Moves all extremities spontaneously. Psych: Normal affect. Responds appropriately.  Labs    Troponin [facility] of Care Test) Recent Labs    02/15/19 1716  TROPIPOC 0.01   Recent Labs    02/15/19 2144 02/16/19 0901  TROPONINI <0.03 <0.03   Lab Results  Component Value Date   WBC 8.2 02/16/2019   HGB 15.0 02/16/2019   HCT 45.6 02/16/2019   MCV 97.0 02/16/2019   PLT 160 02/16/2019    Recent Labs  Lab 02/15/19 1647  02/16/19 0341  NA 141  --  139  K 4.0  --  3.6  CL 104  --  104  CO2 25  --  26  BUN 22  --  22  CREATININE 1.47*   < > 1.34*  CALCIUM 9.7  --  9.0  PROT 6.3*  --   --   BILITOT 0.6  --   --   ALKPHOS 75  --   --   ALT 24  --   --   AST 19  --   --   GLUCOSE 142*  --  146*   < > = values in this interval not displayed.   Lab Results  Component Value Date   CHOL 111 02/15/2019   HDL 35 (L) 02/15/2019   LDLCALC 28 02/15/2019  TRIG 240 (H) 02/15/2019   No results found for: [facility]   Radiology Studies    Dg Chest Port 1 View  Result Date: 02/15/2019 CLINICAL DATA:  Chest pain. EXAM: PORTABLE CHEST 1 VIEW COMPARISON:  Chest x-ray dated March 15, 2018. FINDINGS: The heart remains at the upper limits of  normal in size. Normal mediastinal contours. Normal pulmonary vascularity. No focal consolidation, pleural effusion, or pneumothorax. No acute osseous abnormality. Unchanged surgical clips in the right neck. IMPRESSION: No active disease. Electronically Signed   By: Titus Dubin M.D.   On: 02/15/2019 17:57   Ct Angio Chest/abd/pel For Dissection W And/or W/wo  Result Date: 02/15/2019 CLINICAL DATA:  Chest pain radiating into the shoulder since 10 a.m. this morning. Dizziness on standing. EXAM: CT ANGIOGRAPHY CHEST, ABDOMEN AND PELVIS TECHNIQUE: Multidetector CT imaging through the chest, abdomen and pelvis was performed using the standard protocol during bolus administration of intravenous contrast. Multiplanar reconstructed images and MIPs were obtained and reviewed to evaluate the vascular anatomy. CONTRAST:  100 mL OMNIPAQUE IOHEXOL 350 MG/ML SOLN COMPARISON:  Single-view of the chest earlier today. FINDINGS: CTA CHEST FINDINGS Cardiovascular: Preferential opacification of the thoracic aorta. No evidence of thoracic aortic aneurysm or dissection. Normal heart size. No pericardial effusion. Calcific aortic and coronary atherosclerosis noted. Mediastinum/Nodes: No enlarged mediastinal, hilar, or axillary lymph nodes. Thyroid gland, trachea, and esophagus demonstrate no significant findings. Lungs/Pleura: There is some emphysematous disease present. 0.5 cm left upper lobe nodule on image 24 is seen and there is a 0.5 cm right upper lobe nodule on image 26. 0.3 cm nodule along the posterior aspect of the minor fissure in right upper lobe is identified. 0.3 cm right lower lobe nodule seen on image 66 punctate calcified granuloma in the right lower lobe on image 94 is noted. 0.3 cm nodule in the lingula is seen on image 52. 0.4 cm nodule in the left upper lobe on image 53 is identified. Lungs otherwise clear. No pleural effusion. Musculoskeletal: No acute or focal abnormality. Review of the MIP images confirms  the above findings. CTA ABDOMEN AND PELVIS FINDINGS VASCULAR Aorta: Normal caliber aorta without aneurysm, dissection, vasculitis or significant stenosis. Atherosclerosis is seen. Celiac: Patent without evidence of aneurysm, dissection, vasculitis or significant stenosis. SMA: Patent without evidence of aneurysm, dissection, vasculitis or significant stenosis. Renals: Both renal arteries are patent without evidence of aneurysm, dissection, vasculitis, fibromuscular dysplasia or significant stenosis. IMA: Patent without evidence of aneurysm, dissection, vasculitis or significant stenosis. Inflow: Patent without evidence of aneurysm, dissection, vasculitis or significant stenosis. Veins: No obvious venous abnormality within the limitations of this arterial phase study. Retroaortic left renal vein incidentally noted. Review of the MIP images confirms the above findings. NON-VASCULAR Hepatobiliary: No focal liver abnormality is seen. Status post cholecystectomy. No biliary dilatation. Pancreas: Unremarkable. No pancreatic ductal dilatation or surrounding inflammatory changes. Spleen: Normal in size without focal abnormality. Adrenals/Urinary Tract: Adrenal glands are unremarkable. Kidneys are normal, without renal calculi, focal lesion, or hydronephrosis. Bladder is unremarkable. Stomach/Bowel: Stomach is within normal limits. Status post appendectomy. No evidence of bowel wall thickening, distention, or inflammatory changes. Lymphatic: No lymphadenopathy. Reproductive: Status post hysterectomy. No adnexal masses. Other: None. Musculoskeletal: No acute or focal abnormality. Degenerative disc disease lower lumbar spine noted. Review of the MIP images confirms the above findings. IMPRESSION: Negative for aortic dissection or aneurysm. No acute abnormality chest, abdomen or pelvis. Calcific aortic and coronary atherosclerosis. Multiple scattered pulmonary nodules measuring up to 0.5 cm. No follow-up needed  if patient is  low-risk (and has no known or suspected primary neoplasm). Non-contrast chest CT can be considered in 12 months if patient is high-risk. This recommendation follows the consensus statement: Guidelines for Management of Incidental Pulmonary Nodules Detected on CT Images: From the Fleischner Society 2017; Radiology 2017; 284:228-243. Emphysema. Electronically Signed   By: Inge Rise M.D.   On: 02/15/2019 21:12    EKG     EKG: EKG was personally reviewed and demonstrates: Normal sinus rhythm with no acute ischemic changes.   Telemetry: Telemetry was personally reviewed and demonstrates: Normal sinus rhythm with rates in the 70's to 80's.   Cardiac Imaging    Myoview 01/07/2013: Findings: The overall quality of the study was good. Electrocardiogram was normal. Planar images are normal. SPECT images demonstrate homogenous trace distribution throughout the myocardium.  Assessment & Plan    Chest Pain - Patient presented with chest pain that radiated to back. - EKG showed no acute ischemic changes. - CTA negative for dissection or aneurysm.  - Troponin negative x3. - Presentation atypical. Non-exertional and worse with movement. There is chest wall tenderness on exam but patient states it is a different type of pain. Possibly musculoskeletal. Do not anticipate any additional ischemic evaluation. Spoke with Dr. Johnsie Cancel - OK for discharge from a cardiac standpoint. Will plan for 2-day outpatient Myoview.   CAD - Patient has a history of CAD s/p BMS to LAD in 2009 in [place]. Normal Myoview in 2014. - Continue Aspirin 81mg  ,Plavix 75mg  daily, and Lipitor 10mg  daily. Not on a beta-blocker due to soft BP when previously on it. Per Dr. Blenda Mounts office visit note in 11/2017, they had previously discussed stopping Plavix but patient wanted to continue it. - Patient states she is also on Imdur 60mg  daily but I don't see this on her home medications. I do see that she was previously on this and it  looks like it was stopped on 01/24/2019 but unsure why.  Hypertension - BP well controlled and soft at times with systolic BP in the 95'K. - Patient on Lasix 20mg  daily and Spironolactone 25mg  for lower extremity edema but not on anything specifically for BP.   Hyperlipidemia - Lipid panel this admission: Cholesterol 111, Triglycerides 240, HDL 35, LDL 28.  - At LDL goal of <70 given CAD. - Continue Lipitor 10mg  daily.  Otherwise, per primary team.  Signed, Darreld Mclean, PA-C 02/16/2019, 2:20 PM Pager: [phone] For questions or updates, please contact   Please consult [email or web address] for contact info under Cardiology/STEMI.  Patient examined chart reviewed Discussed care with PA and patient. Obese female Post rotator cuff surgery both shoulders and median nerve release right arm Pain to palpation over chest trace edema no murmur soft abcomen Pain is different than angina in 2009 Pain is muscular and positional R/O no acute ECG changes CTA negative for dissection Ok to d/c home Will arrange outpatient myovue possibly 2 day study and f/u with Dr Ronald Lobo

## 2019-02-16 NOTE — Discharge Instructions (Signed)
Acute Pain, Adult °Acute pain is a type of pain that may last for just a few days or as long as six months. It is often related to an illness, injury, or medical procedure. Acute pain may be mild, moderate, or severe. It usually goes away once your injury has healed or you are no longer ill. °Pain can make it hard for you to do daily activities. It can cause anxiety and lead to other problems if left untreated. Treatment depends on the cause and severity of your acute pain. °Follow these instructions at home: °· Check your pain level as told by your health care provider. °· Take over-the-counter and prescription medicines only as told by your health care provider. °· If you are taking prescription pain medicine: °? Ask your health care provider about taking a stool softener or laxative to prevent constipation. °? Do not stop taking the medicine suddenly. Talk to your health care provider about how and when to discontinue prescription pain medicine. °? If your pain is severe, do not take more pills than instructed by your health care provider. °? Do not take other over-the-counter pain medicines in addition to this medicine unless told by your health care provider. °? Do not drive or operate heavy machinery while taking prescription pain medicine. °· Apply ice or heat as told by your health care provider. These may reduce swelling and pain. °· Ask your health care provider if other strategies such as distraction, relaxation, or physical therapies can help your pain. °· Keep all follow-up visits as told by your health care provider. This is important. °Contact a health care provider if: °· You have pain that is not controlled by medicine. °· Your pain does not improve or gets worse. °· You have side effects from pain medicines, such as vomiting or confusion. °Get help right away if: °· You have severe pain. °· You have trouble breathing. °· You lose consciousness. °· You have chest pain or pressure that lasts for more  than a few minutes. Along with the chest pain you may: °? Have pain or discomfort in one or both arms, your back, neck, jaw, or stomach. °? Have shortness of breath. °? Break out in a cold sweat. °? Feel nauseous. °? Become light-headed. °These symptoms may represent a serious problem that is an emergency. Do not wait to see if the symptoms will go away. Get medical help right away. Call your local emergency services (911 in the [place] Do not drive yourself to the hospital. °This information is not intended to replace advice given to you by your health care provider. Make sure you discuss any questions you have with your health care provider. °Document Released: 09/23/2015 Document Revised: 02/15/2016 Document Reviewed: 09/23/2015 °Elsevier Interactive Patient Education © 2019 [employer] ° °

## 2019-02-16 NOTE — Discharge Summary (Signed)
Physician Discharge Summary  BROOKLYN ALFREDO HQI:[id number] DOB: [birth year] DOA: 02/15/2019  PCP: Briscoe Deutscher, DO  Admit date: 02/15/2019 Discharge date: 02/16/2019  Admitted From: Observation Disposition: home  Recommendations for Outpatient Follow-up:  1. Follow up with PCP in 1-2 weeks 2. Follow-up with cardiology for stratification-cards to arrange stress testing   Home Health:No Equipment/Devices: None Discharge Condition:Stable CODE STATUS:Full code Diet recommendation: Resume home diet  Brief/Interim Summary: Per admitting physician: HPI: Patient is a 68 year old Caucasian female, morbidly obese, with past medical history significant for coronary artery disease/myocardial infarction status post PCI placement, tobacco use discontinues, hypertension, hyperlipidemia, diabetes mellitus and hypothyroidism.  Patient presents with mid lower sternal pain that radiates to the inter scapular area, lasts only a few seconds, with no associated nausea, shortness of breath or diaphoresis.  Patient is on diclofenac sodium (NSAIDs) 7 5 Mg p.o. twice daily.  First troponin is less than 0.01, EKG reveals low voltage EKG involving the cardiac leads with no ST-T wave changes.  No headache, no neck pain, no fever or chills, no URI symptoms, no shortness of breath, no nausea or vomiting, no change in bowel habit, no urinary symptoms and no joint pains.  ED Course: Patient's chest pain is said to have responded to nitro according to the ER physician.  On presentation, patient was afebrile, blood pressure peaked at 132 mmHg (systolic blood pressure), with O2 sat of 97%.  Serum creatinine is 1.47 (up from 1.11-1.27), albumin of 3.5, troponin of 0.01, hemoglobin of 16 g/dL and blood sugar of 142.  Hospital course: Typical chest pain.  Patient was admitted for observation recycle cardiac enzymes which were negative.  Patient was seen by cardiology consultation with recommendations for outpatient risk  stratification with stress testing.  They will arrange.  Felt patient was stable for discharge from cardiology standpoint.  Patient is remained chest pain-free throughout hospital stay.  Tobacco dependence syndrome.  Patient continues to smoke receive tobacco cessation counseling of less than 4 minutes  Hypertension continue patient's home blood pressure medications no acute changes will defer to PCP for further titration.  Diabetes mellitus: Patient will continue home medication regimen on discharge with hemoglobin A1c and lipid monitoring through her PCP.  While in the hospital she was on sliding scale insulin check and blood glucose AC at bedtime.  Hyperlipidemia continue current medications with management as above.  Discharge Diagnoses:  Active Problems:   Chest pain    Discharge Instructions  Discharge Instructions    Call MD for:   Complete by:  As directed    Any acute change in medical condition   Diet - low sodium heart healthy   Complete by:  As directed    Increase activity slowly   Complete by:  As directed      Allergies as of 02/16/2019      Reactions   Clindamycin/lincomycin Other (See Comments)   Head ache    Codeine Hives   Dilaudid [hydromorphone Hcl] Nausea And Vomiting   Iodides    Reglan [metoclopramide] Other (See Comments)   Other reaction(s): Dystonia Hallucinations/Violent   Tramadol    Tape Rash   Blisters       Medication List    TAKE these medications   albuterol 1.25 MG/3ML nebulizer solution Commonly known as:  ACCUNEB Take 3 mLs (1.25 mg total) by nebulization 3 (three) times daily as needed for wheezing.   albuterol 108 (90 Base) MCG/ACT inhaler Commonly known as:  ProAir HFA Inhale 2 puffs into  the lungs every 6 (six) hours as needed for wheezing or shortness of breath.   ALPRAZolam 0.5 MG tablet Commonly known as:  Xanax Take 1 tablet (0.5 mg total) by mouth 2 (two) times daily as needed for anxiety.   aspirin 81 MG  tablet Take 81 mg by mouth daily.   atorvastatin 10 MG tablet Commonly known as:  LIPITOR TAKE 1 TABLET(10 MG) BY MOUTH DAILY What changed:  See the new instructions.   budesonide-formoterol 80-4.5 MCG/ACT inhaler Commonly known as:  Symbicort Inhale 2 puffs into the lungs 2 (two) times daily.   clopidogrel 75 MG tablet Commonly known as:  PLAVIX TAKE 1 TABLET(75 MG) BY MOUTH DAILY What changed:  See the new instructions.   diclofenac 75 MG EC tablet Commonly known as:  VOLTAREN Take 75 mg by mouth 2 (two) times daily.   furosemide 20 MG tablet Commonly known as:  LASIX TAKE 1 TABLET(20 MG) BY MOUTH DAILY What changed:  See the new instructions.   glucose blood test strip Use as instructed   Invokana 300 MG Tabs tablet Generic drug:  canagliflozin TAKE 1 TABLET BY MOUTH DAILY What changed:    how much to take  how to take this  when to take this  additional instructions   ipratropium 0.06 % nasal spray Commonly known as:  Atrovent Place 2 sprays into both nostrils 4 (four) times daily.   levothyroxine 112 MCG tablet Commonly known as:  SYNTHROID TAKE 1 TABLET(112 MCG) BY MOUTH DAILY What changed:  See the new instructions.   metFORMIN 500 MG tablet Commonly known as:  GLUCOPHAGE TAKE 1 TABLET(500 MG) BY MOUTH DAILY What changed:  See the new instructions.   spironolactone 25 MG tablet Commonly known as:  ALDACTONE TAKE 1 TABLET(25 MG) BY MOUTH DAILY What changed:    how much to take  how to take this  when to take this  additional instructions       Allergies  Allergen Reactions  . Clindamycin/Lincomycin Other (See Comments)    Head ache   . Codeine Hives  . Dilaudid [Hydromorphone Hcl] Nausea And Vomiting  . Iodides   . Reglan [Metoclopramide] Other (See Comments)    Other reaction(s): Dystonia Hallucinations/Violent  . Tramadol   . Tape Rash    Blisters     Consultations:  cardiology   Procedures/Studies: Dg Chest Port 1  View  Result Date: 02/15/2019 CLINICAL DATA:  Chest pain. EXAM: PORTABLE CHEST 1 VIEW COMPARISON:  Chest x-ray dated March 15, 2018. FINDINGS: The heart remains at the upper limits of normal in size. Normal mediastinal contours. Normal pulmonary vascularity. No focal consolidation, pleural effusion, or pneumothorax. No acute osseous abnormality. Unchanged surgical clips in the right neck. IMPRESSION: No active disease. Electronically Signed   By: Titus Dubin M.D.   On: 02/15/2019 17:57   Ct Angio Chest/abd/pel For Dissection W And/or W/wo  Result Date: 02/15/2019 CLINICAL DATA:  Chest pain radiating into the shoulder since 10 a.m. this morning. Dizziness on standing. EXAM: CT ANGIOGRAPHY CHEST, ABDOMEN AND PELVIS TECHNIQUE: Multidetector CT imaging through the chest, abdomen and pelvis was performed using the standard protocol during bolus administration of intravenous contrast. Multiplanar reconstructed images and MIPs were obtained and reviewed to evaluate the vascular anatomy. CONTRAST:  100 mL OMNIPAQUE IOHEXOL 350 MG/ML SOLN COMPARISON:  Single-view of the chest earlier today. FINDINGS: CTA CHEST FINDINGS Cardiovascular: Preferential opacification of the thoracic aorta. No evidence of thoracic aortic aneurysm or dissection. Normal heart size.  No pericardial effusion. Calcific aortic and coronary atherosclerosis noted. Mediastinum/Nodes: No enlarged mediastinal, hilar, or axillary lymph nodes. Thyroid gland, trachea, and esophagus demonstrate no significant findings. Lungs/Pleura: There is some emphysematous disease present. 0.5 cm left upper lobe nodule on image 24 is seen and there is a 0.5 cm right upper lobe nodule on image 26. 0.3 cm nodule along the posterior aspect of the minor fissure in right upper lobe is identified. 0.3 cm right lower lobe nodule seen on image 66 punctate calcified granuloma in the right lower lobe on image 94 is noted. 0.3 cm nodule in the lingula is seen on image 52. 0.4 cm  nodule in the left upper lobe on image 53 is identified. Lungs otherwise clear. No pleural effusion. Musculoskeletal: No acute or focal abnormality. Review of the MIP images confirms the above findings. CTA ABDOMEN AND PELVIS FINDINGS VASCULAR Aorta: Normal caliber aorta without aneurysm, dissection, vasculitis or significant stenosis. Atherosclerosis is seen. Celiac: Patent without evidence of aneurysm, dissection, vasculitis or significant stenosis. SMA: Patent without evidence of aneurysm, dissection, vasculitis or significant stenosis. Renals: Both renal arteries are patent without evidence of aneurysm, dissection, vasculitis, fibromuscular dysplasia or significant stenosis. IMA: Patent without evidence of aneurysm, dissection, vasculitis or significant stenosis. Inflow: Patent without evidence of aneurysm, dissection, vasculitis or significant stenosis. Veins: No obvious venous abnormality within the limitations of this arterial phase study. Retroaortic left renal vein incidentally noted. Review of the MIP images confirms the above findings. NON-VASCULAR Hepatobiliary: No focal liver abnormality is seen. Status post cholecystectomy. No biliary dilatation. Pancreas: Unremarkable. No pancreatic ductal dilatation or surrounding inflammatory changes. Spleen: Normal in size without focal abnormality. Adrenals/Urinary Tract: Adrenal glands are unremarkable. Kidneys are normal, without renal calculi, focal lesion, or hydronephrosis. Bladder is unremarkable. Stomach/Bowel: Stomach is within normal limits. Status post appendectomy. No evidence of bowel wall thickening, distention, or inflammatory changes. Lymphatic: No lymphadenopathy. Reproductive: Status post hysterectomy. No adnexal masses. Other: None. Musculoskeletal: No acute or focal abnormality. Degenerative disc disease lower lumbar spine noted. Review of the MIP images confirms the above findings. IMPRESSION: Negative for aortic dissection or aneurysm. No  acute abnormality chest, abdomen or pelvis. Calcific aortic and coronary atherosclerosis. Multiple scattered pulmonary nodules measuring up to 0.5 cm. No follow-up needed if patient is low-risk (and has no known or suspected primary neoplasm). Non-contrast chest CT can be considered in 12 months if patient is high-risk. This recommendation follows the consensus statement: Guidelines for Management of Incidental Pulmonary Nodules Detected on CT Images: From the Fleischner Society 2017; Radiology 2017; 284:228-243. Emphysema. Electronically Signed   By: Inge Rise M.D.   On: 02/15/2019 21:12       Subjective: Sitting in bed.  No chest pain overnight, hemodynamically stable  Discharge Exam: Vitals:   02/16/19 0532 02/16/19 1414  BP: (!) 95/55 118/71  Pulse: 71 66  Resp: 16   Temp: 98 F (36.7 C) (!) 97.5 F (36.4 C)  SpO2: 98% 97%   Vitals:   02/15/19 2100 02/15/19 2155 02/16/19 0532 02/16/19 1414  BP: 125/76  (!) 95/55 118/71  Pulse: 73  71 66  Resp: 17  16   Temp: 98.2 F (36.8 C)  98 F (36.7 C) (!) 97.5 F (36.4 C)  TempSrc: Oral  Oral Oral  SpO2: 98% 99% 98% 97%  Weight: 130.1 kg  130.1 kg   Height: 5\' 5"  (1.651 m)       General: Pt is alert, awake, not in acute distress Cardiovascular: RRR, S1/S2 +,  no rubs, no gallops Respiratory: CTA bilaterally, no wheezing, no rhonchi Abdominal: Soft, NT, ND, bowel sounds + Extremities: no edema, no cyanosis, did have reproducible chest pain on palpation central chest area    The results of significant diagnostics from this hospitalization (including imaging, microbiology, ancillary and laboratory) are listed below for reference.     Microbiology: Recent Results (from the past 240 hour(s))  SARS Coronavirus 2 (CEPHEID - Performed in [place] hospital lab), Hosp Order     Status: None   Collection Time: 02/15/19  5:12 PM  Result Value Ref Range Status   SARS Coronavirus 2 NEGATIVE NEGATIVE Final    Comment:  (NOTE) If result is NEGATIVE SARS-CoV-2 target nucleic acids are NOT DETECTED. The SARS-CoV-2 RNA is generally detectable in upper and lower  respiratory specimens during the acute phase of infection. The lowest  concentration of SARS-CoV-2 viral copies this assay can detect is 250  copies / mL. A negative result does not preclude SARS-CoV-2 infection  and should not be used as the sole basis for treatment or other  patient management decisions.  A negative result may occur with  improper specimen collection / handling, submission of specimen other  than nasopharyngeal swab, presence of viral mutation(s) within the  areas targeted by this assay, and inadequate number of viral copies  (<250 copies / mL). A negative result must be combined with clinical  observations, patient history, and epidemiological information. If result is POSITIVE SARS-CoV-2 target nucleic acids are DETECTED. The SARS-CoV-2 RNA is generally detectable in upper and lower  respiratory specimens dur ing the acute phase of infection.  Positive  results are indicative of active infection with SARS-CoV-2.  Clinical  correlation with patient history and other diagnostic information is  necessary to determine patient infection status.  Positive results do  not rule out bacterial infection or co-infection with other viruses. If result is PRESUMPTIVE POSTIVE SARS-CoV-2 nucleic acids MAY BE PRESENT.   A presumptive positive result was obtained on the submitted specimen  and confirmed on repeat testing.  While 2019 novel coronavirus  (SARS-CoV-2) nucleic acids may be present in the submitted sample  additional confirmatory testing may be necessary for epidemiological  and / or clinical management purposes  to differentiate between  SARS-CoV-2 and other Sarbecovirus currently known to infect humans.  If clinically indicated additional testing with an alternate test  methodology [phone]) is advised. The SARS-CoV-2 RNA is  generally  detectable in upper and lower respiratory sp ecimens during the acute  phase of infection. The expected result is Negative. Fact Sheet for Patients:  StrictlyIdeas.no Fact Sheet for Healthcare Providers: BankingDealers.co.za This test is not yet approved or cleared by the [place] FDA and has been authorized for detection and/or diagnosis of SARS-CoV-2 by FDA under an Emergency Use Authorization (EUA).  This EUA will remain in effect (meaning this test can be used) for the duration of the COVID-19 declaration under Section 564(b)(1) of the Act, 21 U.S.C. section 360bbb-3(b)(1), unless the authorization is terminated or revoked sooner. Performed at [facility], [place] [address] [place], [place] [address]      Labs: BNP (last 3 results) No results for input(s): BNP in the last 8760 hours. Basic Metabolic Panel: Recent Labs  Lab 02/15/19 1647 02/15/19 2144 02/16/19 0341  NA 141  --  139  K 4.0  --  3.6  CL 104  --  104  CO2 25  --  26  GLUCOSE 142*  --  146*  BUN 22  --  22  CREATININE 1.47* 1.37* 1.34*  CALCIUM 9.7  --  9.0  MG  --  1.5*  --    Liver Function Tests: Recent Labs  Lab 02/15/19 1647  AST 19  ALT 24  ALKPHOS 75  BILITOT 0.6  PROT 6.3*  ALBUMIN 3.5   No results for input(s): LIPASE, AMYLASE in the last 168 hours. No results for input(s): AMMONIA in the last 168 hours. CBC: Recent Labs  Lab 02/15/19 1647 02/15/19 2144 02/16/19 0341  WBC 9.1 7.8 8.2  NEUTROABS 5.4  --   --   HGB 16.0* 15.3* 15.0  HCT 49.1* 46.6* 45.6  MCV 96.8 96.1 97.0  PLT 182 164 160   Cardiac Enzymes: Recent Labs  Lab 02/15/19 2144 02/16/19 0901  TROPONINI <0.03 <0.03   BNP: Invalid input(s): POCBNP CBG: Recent Labs  Lab 02/15/19 2121 02/16/19 0753 02/16/19 1147  GLUCAP 158* 109* 132*   D-Dimer No results for input(s): DDIMER in the last 72 hours. Hgb A1c No results for input(s): HGBA1C  in the last 72 hours. Lipid Profile Recent Labs    02/15/19 2144  CHOL 111  HDL 35*  LDLCALC 28  TRIG 240*  CHOLHDL 3.2   Thyroid function studies No results for input(s): TSH, T4TOTAL, T3FREE, THYROIDAB in the last 72 hours.  Invalid input(s): FREET3 Anemia work up No results for input(s): VITAMINB12, FOLATE, FERRITIN, TIBC, IRON, RETICCTPCT in the last 72 hours. Urinalysis    Component Value Date/Time   COLORURINE YELLOW 04/16/2018 1024   APPEARANCEUR CLEAR 04/16/2018 1024   LABSPEC <=1.005 (A) 04/16/2018 1024   PHURINE 6.0 04/16/2018 1024   GLUCOSEU >=1000 (A) 04/16/2018 1024   HGBUR NEGATIVE 04/16/2018 1024   BILIRUBINUR NEGATIVE 04/16/2018 1024   KETONESUR NEGATIVE 04/16/2018 1024   UROBILINOGEN 0.2 04/16/2018 1024   NITRITE NEGATIVE 04/16/2018 1024   LEUKOCYTESUR NEGATIVE 04/16/2018 1024   Sepsis Labs Invalid input(s): PROCALCITONIN,  WBC,  LACTICIDVEN Microbiology Recent Results (from the past 240 hour(s))  SARS Coronavirus 2 (CEPHEID - Performed in [place] hospital lab), Hosp Order     Status: None   Collection Time: 02/15/19  5:12 PM  Result Value Ref Range Status   SARS Coronavirus 2 NEGATIVE NEGATIVE Final    Comment: (NOTE) If result is NEGATIVE SARS-CoV-2 target nucleic acids are NOT DETECTED. The SARS-CoV-2 RNA is generally detectable in upper and lower  respiratory specimens during the acute phase of infection. The lowest  concentration of SARS-CoV-2 viral copies this assay can detect is 250  copies / mL. A negative result does not preclude SARS-CoV-2 infection  and should not be used as the sole basis for treatment or other  patient management decisions.  A negative result may occur with  improper specimen collection / handling, submission of specimen other  than nasopharyngeal swab, presence of viral mutation(s) within the  areas targeted by this assay, and inadequate number of viral copies  (<250 copies / mL). A negative result must be  combined with clinical  observations, patient history, and epidemiological information. If result is POSITIVE SARS-CoV-2 target nucleic acids are DETECTED. The SARS-CoV-2 RNA is generally detectable in upper and lower  respiratory specimens dur ing the acute phase of infection.  Positive  results are indicative of active infection with SARS-CoV-2.  Clinical  correlation with patient history and other diagnostic information is  necessary to determine patient infection status.  Positive results do  not rule out bacterial infection or  co-infection with other viruses. If result is PRESUMPTIVE POSTIVE SARS-CoV-2 nucleic acids MAY BE PRESENT.   A presumptive positive result was obtained on the submitted specimen  and confirmed on repeat testing.  While 2019 novel coronavirus  (SARS-CoV-2) nucleic acids may be present in the submitted sample  additional confirmatory testing may be necessary for epidemiological  and / or clinical management purposes  to differentiate between  SARS-CoV-2 and other Sarbecovirus currently known to infect humans.  If clinically indicated additional testing with an alternate test  methodology [phone]) is advised. The SARS-CoV-2 RNA is generally  detectable in upper and lower respiratory sp ecimens during the acute  phase of infection. The expected result is Negative. Fact Sheet for Patients:  StrictlyIdeas.no Fact Sheet for Healthcare Providers: BankingDealers.co.za This test is not yet approved or cleared by the [place] FDA and has been authorized for detection and/or diagnosis of SARS-CoV-2 by FDA under an Emergency Use Authorization (EUA).  This EUA will remain in effect (meaning this test can be used) for the duration of the COVID-19 declaration under Section 564(b)(1) of the Act, 21 U.S.C. section 360bbb-3(b)(1), unless the authorization is terminated or revoked sooner. Performed at [facility], [place] [address] [place], [place] [address]      Time coordinating discharge: 35 minutes  SIGNED:   Nicolette Bang, MD  [facility] 02/16/2019, 3:42 PM Pager   If 7PM-7AM, please contact night-coverage [email or web address] Password [email or web address]

## 2019-02-16 NOTE — Care Management Obs Status (Signed)
[facility] NOTIFICATION   Patient Details  Name: [name] [name] MRN: [id number] Date of Birth: [birth year]   Medicare Observation Status Notification Given:  Yes    Bethena Roys, RN 02/16/2019, 3:28 PM

## 2019-02-16 NOTE — Progress Notes (Signed)
yocar

## 2019-02-17 ENCOUNTER — Telehealth: Payer: Self-pay

## 2019-02-17 NOTE — Telephone Encounter (Signed)
LM to return call for TCM. 

## 2019-02-18 ENCOUNTER — Telehealth: Payer: Self-pay

## 2019-02-18 NOTE — Telephone Encounter (Signed)
Per Chart Review:  Admit date: 02/15/2019 Discharge date: 02/16/2019  Admitted From: Observation Disposition: home  Recommendations for Outpatient Follow-up:  1. Follow up with PCP in 1-2 weeks 2. Follow-up with cardiology for stratification-cards to arrange stress testing   Home Health:No Equipment/Devices: None Discharge Condition:Stable CODE STATUS:Full code Diet recommendation: Resume home diet  Brief/Interim Summary: Per admitting physician: OZY:[id number] is a 68 year old Caucasian female, morbidly obese, with past medical history significant for coronary artery disease/myocardial infarction status post PCI placement, tobacco use discontinues, hypertension, hyperlipidemia, diabetes mellitus and hypothyroidism. Patient presents with mid lower sternal pain that radiates to the interscapular area, lasts only a few seconds, with no associated nausea, shortness of breath or diaphoresis. Patient is on diclofenac sodium (NSAIDs) 7 5 Mg p.o. twice daily. First troponin is less than 0.01, EKG reveals low voltage EKG involving the cardiac leads with no ST-T wave changes.No headache, no neck pain, no fever or chills, no URI symptoms, no shortness of breath, no nausea or vomiting, no change in bowel habit, no urinary symptoms and no joint pains.  ________________________________________________________________  Per Telephone Call:  Transition Care Management Follow-up Telephone Call   Date discharged?  02/16/2019   How have you been since you were released from the hospital? Better   Do you understand why you were in the hospital? Yes.  I pulled a muscle in my chest.   Do you understand the discharge instructions? yes   Where were you discharged to? Home   Items Reviewed:  Medications reviewed: yes.  Patient states she did not receive all of her medications while in the hospital, but is now taking them since returning home.  Allergies reviewed: yes  Dietary changes  reviewed: N/A  Referrals reviewed: N/A   Functional Questionnaire:   Activities of Daily Living (ADLs):   She states they are independent in the following: ambulation, bathing and hygiene, feeding, continence, grooming, toileting and dressing States they require assistance with the following: N/A   Any transportation issues/concerns?: no   Any patient concerns? no   Confirmed importance and date/time of follow-up visits scheduled yes  Provider Appointment booked with Dr. Juleen China on 02/25/2019 @ 11:00 via [email or web address]  Confirmed with patient if condition begins to worsen call PCP or go to the ER.  Patient was given the office number and encouraged to call back with question or concerns.  : yes

## 2019-02-19 ENCOUNTER — Telehealth: Payer: Self-pay | Admitting: Family Medicine

## 2019-02-19 DIAGNOSIS — E039 Hypothyroidism, unspecified: Secondary | ICD-10-CM

## 2019-02-22 ENCOUNTER — Other Ambulatory Visit: Payer: Self-pay | Admitting: Family Medicine

## 2019-02-22 DIAGNOSIS — E039 Hypothyroidism, unspecified: Secondary | ICD-10-CM

## 2019-02-22 MED ORDER — LEVOTHYROXINE SODIUM 112 MCG PO TABS: ORAL_TABLET | ORAL | 0 refills | Status: DC

## 2019-02-22 MED ORDER — METFORMIN HCL 500 MG PO TABS: ORAL_TABLET | ORAL | 0 refills | Status: DC

## 2019-02-22 NOTE — Telephone Encounter (Signed)
See note

## 2019-02-22 NOTE — Telephone Encounter (Signed)
Copied from [place] [id number]. Topic: Quick Communication - Rx Refill/Question >> Feb 22, 2019  4:02 PM Richardo Priest, NT wrote: Medication:  levothyroxine (SYNTHROID, LEVOTHROID) 112 MCG tablet metFORMIN (GLUCOPHAGE) 500 MG tablet  Has the patient contacted their pharmacy? Yes. Patient has 0 of synthroid, 2 pills for metformin  Preferred Pharmacy (with phone number or street name):  [facility] [place], [place] [phone] (Phone) [phone] (Fax)  Agent: Please be advised that RX refills may take up to 3 business days. We ask that you follow-up with your pharmacy.

## 2019-02-22 NOTE — Telephone Encounter (Signed)
Pt called to check on this.  States last time she got medication it was in February and now she has none left and pharmacy will not refill.

## 2019-02-22 NOTE — Telephone Encounter (Signed)
Rx refilled, called pt and advised.

## 2019-02-22 NOTE — Telephone Encounter (Signed)
Rx refilled. Called pt and advised.

## 2019-02-24 NOTE — Progress Notes (Signed)
Virtual Visit via Video   Due to the COVID-19 pandemic, this visit was completed with telemedicine (audio/video) technology to reduce patient and provider exposure as well as to preserve personal protective equipment.   I connected with [name] [name] by a video enabled telemedicine application and verified that I am speaking with the correct person using two identifiers. Location patient: Home Location provider: [facility], Office Persons participating in the virtual visit: [name] [name], [place], DO [name], CMA acting as scribe for [name].   I discussed the limitations of evaluation and management by telemedicine and the availability of in person appointments. The patient expressed understanding and agreed to proceed.  Care Team   Patient Care Team: Briscoe Deutscher, DO as PCP - General (Family Medicine) Skeet Latch, MD as PCP - Cardiology (Cardiology) Skeet Latch, MD as Attending Physician (Cardiology) Ardath Sax, MD (Inactive) as Consulting Physician (Hematology and Oncology) Almedia Balls, MD as Consulting Physician (Orthopedic Surgery)  Subjective:   HPI:   From ED Visit: Patient is a 68 year old Caucasian female, morbidly obese, with past medical history significant for coronary artery disease/myocardial infarction status post PCI placement, tobacco use discontinues, hypertension, hyperlipidemia, diabetes mellitus and hypothyroidism. Patient presents with mid lower sternal pain that radiates to the interscapular area, lasts only a few seconds, with no associated nausea, shortness of breath or diaphoresis. Patient is on diclofenac sodium (NSAIDs) 7 5 Mg p.o. twice daily. First troponin is less than 0.01, EKG reveals low voltage EKG involving the cardiac leads with no ST-T wave changes.No headache, no neck pain, no fever or chills, no URI symptoms, no shortness of breath, no nausea or vomiting, no change in bowel habit, no urinary  symptoms and no joint pains. Admitted for observation. Not thought to be cardiac-related. Has f/u with Cardiology next week for stress testing.   Patient is doing much better now. Swimming. No CP, SOB, edema. Still not ready to stop smoking.  Lab Results  Component Value Date   ALT 24 02/15/2019   AST 19 02/15/2019   ALKPHOS 75 02/15/2019   BILITOT 0.6 02/15/2019   Lab Results  Component Value Date   CHOL 111 02/15/2019   HDL 35 (L) 02/15/2019   LDLCALC 28 02/15/2019   LDLDIRECT 55.0 02/04/2019   TRIG 240 (H) 02/15/2019   CHOLHDL 3.2 02/15/2019   Lab Results  Component Value Date   CREATININE 1.34 (H) 02/16/2019   CREATININE 1.37 (H) 02/15/2019   CREATININE 1.47 (H) 02/15/2019   CBC Latest Ref Rng & Units 02/16/2019 02/15/2019 02/15/2019  WBC 4.0 - 10.5 K/uL 8.2 7.8 9.1  Hemoglobin 12.0 - 15.0 g/dL 15.0 15.3(H) 16.0(H)  Hematocrit 36.0 - 46.0 % 45.6 46.6(H) 49.1(H)  Platelets 150 - 400 K/uL 160 164 182   BP Readings from Last 3 Encounters:  02/16/19 118/71  01/24/19 120/70  10/19/18 103/70   Wt Readings from Last 3 Encounters:  02/25/19 286 lb (129.7 kg)  02/16/19 286 lb 13.1 oz (130.1 kg)  01/24/19 287 lb (130.2 kg)   Lab Results  Component Value Date   HGBA1C 7.6 (H) 02/04/2019   Review of Systems  Constitutional: Negative for chills and fever.  HENT: Negative for ear pain, hearing loss and tinnitus.   Eyes: Negative for blurred vision and double vision.  Respiratory: Negative for cough.   Cardiovascular: Negative for chest pain, palpitations and leg swelling.  Gastrointestinal: Negative for nausea and vomiting.  Genitourinary: Negative for dysuria and urgency.  Musculoskeletal:  Negative for myalgias and neck pain.  Skin: Negative for rash.  Neurological: Negative for dizziness and headaches.  Endo/Heme/Allergies: Does not bruise/bleed easily.  Psychiatric/Behavioral: Negative for depression and suicidal ideas.    Patient Active Problem List   Diagnosis  Date Noted  . Chest pain 02/15/2019  . B12 deficiency 10/22/2018  . Rosacea 10/21/2018  . Bilateral lower extremity edema 10/21/2018  . Left shoulder pain 07/21/2018  . Diabetic peripheral neuropathy associated with type 2 diabetes mellitus ([place]), on Gabapentin 04/18/2018  . Gout, intermittent, prn colchicine 04/18/2018  . Balance problem, uses cane 03/17/2018  . Hypertension associated with diabetes ([place]) 03/14/2018  . Hepatic steatosis 03/14/2018  . Polycythemia, secondary, followed by Heme, therapeutic phlebotomy 10/15/2017  . Anxiety, uses prn Xanax 05/27/2017  . Mild obstructive sleep apnea-hypopnea syndrome but with sig desats, not on CPAP 01/28/2017  . DM (diabetes mellitus) with complications ([place]), on Metformin and Invokana   . CAD, BMS to LAD 02/2008   . Bronchitis, asthmatic   . MI, old   . Hypothyroidism (acquired), stable on Levothyroxine   . COPD GOLD 0/ prob AB still smoking  11/07/2016  . S/P coronary artery stent placement 04/16/2016  . Hypertensive heart disease 04/16/2016  . Hyperlipidemia associated with type 2 diabetes mellitus ([place]), on Lipitor 04/16/2016  . Nicotine dependence, cigarrettes, precontemplative 04/16/2016  . Morbid (severe) obesity due to excess calories ([place]) 04/16/2016    Social History   Tobacco Use  . Smoking status: Current Every Day Smoker    Packs/day: 0.50    Years: 31.00    Pack years: 15.50  . Smokeless tobacco: Never Used  Substance Use Topics  . Alcohol use: No    Current Outpatient Medications:  .  albuterol (ACCUNEB) 1.25 MG/3ML nebulizer solution, Take 3 mLs (1.25 mg total) by nebulization 3 (three) times daily as needed for wheezing., Disp: 75 mL, Rfl: 2 .  albuterol (PROAIR HFA) 108 (90 Base) MCG/ACT inhaler, Inhale 2 puffs into the lungs every 6 (six) hours as needed for wheezing or shortness of breath., Disp: 1 Inhaler, Rfl: 11 .  ALPRAZolam (XANAX) 0.5 MG tablet, Take 1 tablet (0.5 mg total) by mouth 2 (two) times daily  as needed for anxiety., Disp: 60 tablet, Rfl: 3 .  aspirin 81 MG tablet, Take 81 mg by mouth daily., Disp: , Rfl:  .  atorvastatin (LIPITOR) 10 MG tablet, TAKE 1 TABLET(10 MG) BY MOUTH DAILY (Patient taking differently: Take 10 mg by mouth daily. ), Disp: 90 tablet, Rfl: 0 .  budesonide-formoterol (SYMBICORT) 80-4.5 MCG/ACT inhaler, Inhale 2 puffs into the lungs 2 (two) times daily., Disp: 10.2 g, Rfl: 3 .  clopidogrel (PLAVIX) 75 MG tablet, TAKE 1 TABLET(75 MG) BY MOUTH DAILY (Patient taking differently: Take 75 mg by mouth daily. ), Disp: 90 tablet, Rfl: 0 .  diclofenac (VOLTAREN) 75 MG EC tablet, Take 75 mg by mouth 2 (two) times daily. , Disp: , Rfl:  .  furosemide (LASIX) 20 MG tablet, TAKE 1 TABLET(20 MG) BY MOUTH DAILY (Patient taking differently: Take 20 mg by mouth daily. ), Disp: 30 tablet, Rfl: 3 .  glucose blood test strip, Use as instructed, Disp: 100 each, Rfl: 12 .  INVOKANA 300 MG TABS tablet, TAKE 1 TABLET BY MOUTH DAILY (Patient taking differently: Take 300 mg by mouth daily. ), Disp: 90 tablet, Rfl: 1 .  ipratropium (ATROVENT) 0.06 % nasal spray, Place 2 sprays into both nostrils 4 (four) times daily., Disp: 15 mL, Rfl: 11 .  levothyroxine (SYNTHROID) 112 MCG tablet, TAKE 1 TABLET(112 MCG) BY MOUTH DAILY, Disp: 90 tablet, Rfl: 0 .  metFORMIN (GLUCOPHAGE) 500 MG tablet, TAKE 1 TABLET(500 MG) BY MOUTH DAILY, Disp: 90 tablet, Rfl: 0 .  spironolactone (ALDACTONE) 25 MG tablet, TAKE 1 TABLET(25 MG) BY MOUTH DAILY (Patient taking differently: Take 25 mg by mouth daily. ), Disp: 90 tablet, Rfl: 1  Allergies  Allergen Reactions  . Clindamycin/Lincomycin Other (See Comments)    Head ache   . Codeine Hives  . Dilaudid [Hydromorphone Hcl] Nausea And Vomiting  . Iodides   . Reglan [Metoclopramide] Other (See Comments)    Other reaction(s): Dystonia Hallucinations/Violent  . Tramadol   . Tape Rash    Blisters     Objective:   VITALS: Per patient if applicable, see  vitals. GENERAL: Alert, appears well and in no acute distress. HEENT: Atraumatic, conjunctiva clear, no obvious abnormalities on inspection of external nose and ears. NECK: Normal movements of the head and neck. CARDIOPULMONARY: No increased WOB. Speaking in clear sentences. I:E ratio WNL.  MS: Moves all visible extremities without noticeable abnormality. PSYCH: Pleasant and cooperative, well-groomed. Speech normal rate and rhythm. Affect is appropriate. Insight and judgement are appropriate. Attention is focused, linear, and appropriate.  NEURO: CN grossly intact. Oriented as arrived to appointment on time with no prompting. Moves both UE equally.  SKIN: No obvious lesions, wounds, erythema, or cyanosis noted on face or hands.  Depression screen [facility] 2/9 10/18/2018 10/18/2018 09/24/2018  Decreased Interest 0 0 1  Down, Depressed, Hopeless 1 1 0  PHQ - 2 Score 1 1 1   Altered sleeping 0 0 1  Tired, decreased energy 0 0 1  Change in appetite 1 1 1   Feeling bad or failure about yourself  0 0 1  Trouble concentrating 0 0 0  Moving slowly or fidgety/restless 0 0 0  Suicidal thoughts 0 0 0  PHQ-9 Score 2 2 5   Difficult doing work/chores Not difficult at all Not difficult at all Not difficult at all    Assessment and Plan:   Claretha was seen today for follow-up.  Diagnoses and all orders for this visit:  Diabetic peripheral neuropathy associated with type 2 diabetes mellitus ([place]), on Gabapentin  Hypertension associated with diabetes ([place])  Polycythemia, secondary, followed by Heme, therapeutic phlebotomy  Mild obstructive sleep apnea-hypopnea syndrome but with sig desats, not on CPAP  Hypothyroidism (acquired), stable on Levothyroxine  Cigarette nicotine dependence without complication  Morbid (severe) obesity due to excess calories ([place])  Hyperlipidemia associated with type 2 diabetes mellitus ([place]), on Lipitor    . COVID-19 Education: The signs and symptoms of COVID-19 were  discussed with the patient and how to seek care for testing if needed. The importance of social distancing was discussed today. . Reviewed expectations re: course of current medical issues. . Discussed self-management of symptoms. . Outlined signs and symptoms indicating need for more acute intervention. . Patient verbalized understanding and all questions were answered. Marland Kitchen Health Maintenance issues including appropriate healthy diet, exercise, and smoking avoidance were discussed with patient. . See orders for this visit as documented in the electronic medical record.  Briscoe Deutscher, DO  Records requested if needed. Time spent: 25 minutes, of which >50% was spent in obtaining information about her symptoms, reviewing her previous labs, evaluations, and treatments, counseling her about her condition (please see the discussed topics above), and developing a plan to further investigate it; she had a number of questions which I addressed.

## 2019-02-25 ENCOUNTER — Telehealth (HOSPITAL_COMMUNITY): Payer: Self-pay | Admitting: *Deleted

## 2019-02-25 ENCOUNTER — Ambulatory Visit (INDEPENDENT_AMBULATORY_CARE_PROVIDER_SITE_OTHER): Payer: Medicare HMO | Admitting: Family Medicine

## 2019-02-25 ENCOUNTER — Other Ambulatory Visit: Payer: Self-pay

## 2019-02-25 ENCOUNTER — Encounter: Payer: Self-pay | Admitting: Family Medicine

## 2019-02-25 VITALS — Ht 65.0 in | Wt 286.0 lb

## 2019-02-25 DIAGNOSIS — E1142 Type 2 diabetes mellitus with diabetic polyneuropathy: Secondary | ICD-10-CM | POA: Diagnosis not present

## 2019-02-25 DIAGNOSIS — E1159 Type 2 diabetes mellitus with other circulatory complications: Secondary | ICD-10-CM

## 2019-02-25 DIAGNOSIS — E1169 Type 2 diabetes mellitus with other specified complication: Secondary | ICD-10-CM

## 2019-02-25 DIAGNOSIS — I10 Essential (primary) hypertension: Secondary | ICD-10-CM

## 2019-02-25 DIAGNOSIS — G4733 Obstructive sleep apnea (adult) (pediatric): Secondary | ICD-10-CM

## 2019-02-25 DIAGNOSIS — E785 Hyperlipidemia, unspecified: Secondary | ICD-10-CM

## 2019-02-25 DIAGNOSIS — F17210 Nicotine dependence, cigarettes, uncomplicated: Secondary | ICD-10-CM | POA: Diagnosis not present

## 2019-02-25 DIAGNOSIS — D751 Secondary polycythemia: Secondary | ICD-10-CM | POA: Diagnosis not present

## 2019-02-25 DIAGNOSIS — I152 Hypertension secondary to endocrine disorders: Secondary | ICD-10-CM

## 2019-02-25 DIAGNOSIS — E039 Hypothyroidism, unspecified: Secondary | ICD-10-CM

## 2019-02-25 NOTE — Telephone Encounter (Signed)
Close encounter 

## 2019-02-28 ENCOUNTER — Telehealth: Payer: Self-pay | Admitting: Cardiovascular Disease

## 2019-02-28 NOTE — Telephone Encounter (Signed)
Can patient take her metformin and invokana before myoview? I looked and normally they only hold beta blockers- but I wanted to double check.

## 2019-02-28 NOTE — Telephone Encounter (Signed)
Called, spoke to patient- advised of message.  Patient asked if she could move the appointment, I advised I would send a message- but unsure if they would receive it in time, since her appointment was first thing. Patient states never mind, she would come in anyway.

## 2019-02-28 NOTE — Telephone Encounter (Signed)
Okay to hold metformin as well.   All other medication should be fine

## 2019-02-28 NOTE — Telephone Encounter (Signed)
New Message     Pt is wondering if she can take her sugar pill in the morning before her test, she is a little confused and not sure what she should and should not take     Please call back

## 2019-03-01 ENCOUNTER — Other Ambulatory Visit: Payer: Self-pay

## 2019-03-01 ENCOUNTER — Ambulatory Visit (HOSPITAL_COMMUNITY)
Admission: RE | Admit: 2019-03-01 | Discharge: 2019-03-01 | Disposition: A | Discharge status: Home / Self Care | Payer: Medicare HMO | Source: Ambulatory Visit | Attending: Internal Medicine | Admitting: Internal Medicine

## 2019-03-01 DIAGNOSIS — R0789 Other chest pain: Secondary | ICD-10-CM | POA: Diagnosis not present

## 2019-03-01 DIAGNOSIS — R079 Chest pain, unspecified: Secondary | ICD-10-CM

## 2019-03-01 MED ORDER — TECHNETIUM TC 99M TETROFOSMIN IV KIT
30.2000 | PACK | Freq: Once | INTRAVENOUS | Status: AC | PRN
  Administered 2019-03-01: 30.2 via INTRAVENOUS
  Filled 2019-03-01: qty 31

## 2019-03-01 MED ORDER — REGADENOSON 0.4 MG/5ML IV SOLN
0.4000 mg | Freq: Once | INTRAVENOUS | Status: AC
  Administered 2019-03-01: 0.4 mg via INTRAVENOUS

## 2019-03-01 MED ORDER — AMINOPHYLLINE 25 MG/ML IV SOLN
75.0000 mg | Freq: Once | INTRAVENOUS | Status: AC
  Administered 2019-03-01: 75 mg via INTRAVENOUS

## 2019-03-02 ENCOUNTER — Ambulatory Visit (HOSPITAL_COMMUNITY)
Admission: RE | Admit: 2019-03-02 | Discharge: 2019-03-02 | Disposition: A | Discharge status: Home / Self Care | Payer: Medicare HMO | Source: Ambulatory Visit | Attending: Cardiology | Admitting: Cardiology

## 2019-03-02 ENCOUNTER — Telehealth (HOSPITAL_COMMUNITY): Payer: Self-pay | Admitting: *Deleted

## 2019-03-02 LAB — MYOCARDIAL PERFUSION IMAGING
LV dias vol: 65 mL (ref 46–106)
LV sys vol: 13 mL
Peak HR: 82 {beats}/min
Rest HR: 85 {beats}/min
SDS: 6
SRS: 2
SSS: 8
TID: 0.99

## 2019-03-02 MED ORDER — TECHNETIUM TC 99M TETROFOSMIN IV KIT
29.6000 | PACK | Freq: Once | INTRAVENOUS | Status: AC | PRN
  Administered 2019-03-02: 29.6 via INTRAVENOUS

## 2019-03-02 NOTE — Telephone Encounter (Signed)
Close encounter 

## 2019-03-03 ENCOUNTER — Other Ambulatory Visit: Payer: Self-pay | Admitting: Student

## 2019-03-03 DIAGNOSIS — R079 Chest pain, unspecified: Secondary | ICD-10-CM

## 2019-03-07 ENCOUNTER — Telehealth: Payer: Self-pay | Admitting: Cardiovascular Disease

## 2019-03-07 NOTE — Telephone Encounter (Signed)
LMTCB Patient needs fu appt after her echo with Dr Oval Linsey APP. Please schedule.

## 2019-03-10 ENCOUNTER — Telehealth: Payer: Self-pay | Admitting: Adult Health

## 2019-03-10 ENCOUNTER — Telehealth (HOSPITAL_COMMUNITY): Payer: Self-pay

## 2019-03-10 NOTE — Telephone Encounter (Signed)
LMTCB COVID prescreening for echo. Left echo appointment details per DPR. 

## 2019-03-10 NOTE — Telephone Encounter (Signed)
Mychart, no smartphone, consent, pre reg complete 03/10/19 AF °

## 2019-03-11 ENCOUNTER — Ambulatory Visit (HOSPITAL_COMMUNITY): Payer: Medicare HMO | Attending: Cardiology

## 2019-03-11 ENCOUNTER — Other Ambulatory Visit: Payer: Self-pay

## 2019-03-11 DIAGNOSIS — R079 Chest pain, unspecified: Secondary | ICD-10-CM | POA: Diagnosis not present

## 2019-03-11 MED ORDER — PERFLUTREN LIPID MICROSPHERE
1.0000 mL | INTRAVENOUS | Status: AC | PRN
  Administered 2019-03-11: 2 mL via INTRAVENOUS

## 2019-03-15 ENCOUNTER — Ambulatory Visit (INDEPENDENT_AMBULATORY_CARE_PROVIDER_SITE_OTHER): Payer: Medicare HMO | Admitting: Family Medicine

## 2019-03-15 ENCOUNTER — Other Ambulatory Visit: Payer: Self-pay

## 2019-03-15 ENCOUNTER — Encounter: Payer: Self-pay | Admitting: Family Medicine

## 2019-03-15 VITALS — BP 118/72 | HR 83 | Temp 98.1°F | Ht 65.0 in | Wt 276.4 lb

## 2019-03-15 DIAGNOSIS — M25532 Pain in left wrist: Secondary | ICD-10-CM | POA: Diagnosis not present

## 2019-03-15 MED ORDER — DICLOFENAC SODIUM 2 % TD SOLN: TRANSDERMAL | 0 refills | Status: DC

## 2019-03-15 MED ORDER — PREDNISONE 50 MG PO TABS: ORAL_TABLET | ORAL | 0 refills | Status: DC

## 2019-03-15 NOTE — Progress Notes (Signed)
   Chief Complaint:  TAHNI PORCHIA is a 68 y.o. female who presents for same day appointment with a chief complaint of wrist pain.   Assessment/Plan:  Left Wrist Pain Likely sprain.  Low-energy mechanism of injury-doubt fracture or any other bony pathology.  Will place in wrist brace today.  Recommended compression and ice as needed.  She is already on diclofenac 75 mg twice daily for her back.  Will start short course of prednisone.  Also start topical Pennsaid.  If symptoms not improved in the next 1 to 2 weeks, will need imaging and/or referral to sports med.     Subjective:  HPI:  Wrist Pain, acute problem Started about 3 weeks ago.  Located on the dorsal aspect of her left wrist.  Patient states that she was walking through her kitchen when she tripped and caught herself on the doorway.  She immediately noticed pain to her left upper extremity.  For a little while however have been worsening over the last couple of days.  Tylenol helps some with the pain.  She has had some swelling to the top of her left wrist as well.  No redness.  Pain is worse with bending and certain movements.  No reported weakness or numbness.  No other treatments tried.  No other obvious alleviating or aggravating factors.    ROS: Per HPI  PMH: She reports that she has been smoking. She has a 15.50 pack-year smoking history. She has never used smokeless tobacco. She reports that she does not drink alcohol or use drugs.      Objective:  Physical Exam: BP 118/72 (BP Location: Left Arm, Patient Position: Sitting, Cuff Size: Large)   Pulse 83   Temp 98.1 F (36.7 C) (Oral)   Ht 5\' 5"  (1.651 m)   Wt 276 lb 6.4 oz (125.4 kg)   SpO2 96%   BMI 46.00 kg/m   Gen: NAD, resting comfortably MSK: Left wrist with no gross abnormalities.  Tender to palpation along dorsal aspect.  Full range of motion with slight pain with forced extension and flexion.  Neurovascular intact distally.  No snuffbox tenderness.     Algis Greenhouse. Jerline Pain, MD 03/15/2019 10:36 AM

## 2019-03-15 NOTE — Patient Instructions (Signed)
It was very nice to see you today!  I think you probably sprained your wrist.  Please start the prednisone and Pennsaid.  Please use the brace as much as possible.  You can use ice to the area as needed.  Let me know or let Dr. Juleen China know if your symptoms do not improve the next 2 weeks.  Take care, Dr Jerline Pain

## 2019-03-16 ENCOUNTER — Encounter: Payer: Self-pay | Admitting: Adult Health

## 2019-03-16 ENCOUNTER — Telehealth (INDEPENDENT_AMBULATORY_CARE_PROVIDER_SITE_OTHER): Payer: Medicare HMO | Admitting: Adult Health

## 2019-03-16 VITALS — BP 120/72 | Ht 65.0 in | Wt 272.0 lb

## 2019-03-16 DIAGNOSIS — I10 Essential (primary) hypertension: Secondary | ICD-10-CM

## 2019-03-16 DIAGNOSIS — J449 Chronic obstructive pulmonary disease, unspecified: Secondary | ICD-10-CM | POA: Diagnosis not present

## 2019-03-16 DIAGNOSIS — I510 Cardiac septal defect, acquired: Secondary | ICD-10-CM

## 2019-03-16 DIAGNOSIS — I2510 Atherosclerotic heart disease of native coronary artery without angina pectoris: Secondary | ICD-10-CM

## 2019-03-16 MED ORDER — ATORVASTATIN CALCIUM 10 MG PO TABS: 10.0000 mg | ORAL_TABLET | Freq: Every day | ORAL | 1 refills | Status: DC

## 2019-03-16 NOTE — Patient Instructions (Signed)
Follow-Up: You will need a follow up appointment in 6 months.  Please call our office 2 months in advance, October 2020, to schedule this,DECEMBER 2020 appointment.  You may see Skeet Latch, MD  Jory Sims, DNP, AACC  or one of the following Advanced Practice Providers on your designated Care Team:  Kerin Ransom, [place]  Roby Lofts, PA-C Sande Rives, [place]      Medication Instructions:  NO CHANGES- Your physician recommends that you continue on your current medications as directed. Please refer to the Current Medication list given to you today. If you need a refill on your cardiac medications before your next appointment, please call your pharmacy. Labwork: When you have labs (blood work) and your tests are completely normal, you will receive your results ONLY by [place] (if you have MyChart) -OR- A paper copy in the mail.  At [facility], you and your health needs are our priority.  As part of our continuing mission to provide you with exceptional heart care, we have created designated Provider Care Teams.  These Care Teams include your primary Cardiologist (physician) and Advanced Practice Providers (APPs -  Physician Assistants and Nurse Practitioners) who all work together to provide you with the care you need, when you need it.  Thank you for choosing [facility] at [facility]!!

## 2019-03-16 NOTE — Progress Notes (Signed)
Virtual Visit via Telephone Note   This visit type was conducted due to national recommendations for restrictions regarding the COVID-19 Pandemic (e.g. social distancing) in an effort to limit this patient's exposure and mitigate transmission in our community.  Due to her co-morbid illnesses, this patient is at least at moderate risk for complications without adequate follow up.  This format is felt to be most appropriate for this patient at this time.  The patient did not have access to video technology/had technical difficulties with video requiring transitioning to audio format only (telephone).  All issues noted in this document were discussed and addressed.  No physical exam could be performed with this format.  Please refer to the patient's chart for her  consent to telehealth for [facility].   Date:  03/16/2019   ID:  [name] [name] [birth year], MRN [id number]  Patient Location: Home Provider Location: Home  PCP:  Briscoe Deutscher, DO  Cardiologist:  Skeet Latch, MD  Electrophysiologist:  None   Evaluation Performed:  Follow-Up Visit   Chief Complaint:  Post hospital follow up.  History of Present Illness:    [name] [name] is a 68 y.o. female with known history of coronary artery disease status post PCI placement, hypertension, hyperlipidemia, diabetes, with other history to include tobacco abuse and hypothyroidism.  The patient was recently admitted to the hospital with recurrent chest pain which was thought to be related to musculoskeletal strain.  She was ruled out for ACS.  The patient also fell at home tripping over something and to break her fall she landed on her arm and is now wearing a brace.  She is recently moved here from [place] and has been unpacking several months as she is pacing herself to avoid over strenuous activity.  Today on visit she states she was having "a bad day" as her in ground pool pump was malfunctioning and she was having to call a repairman.   She is frustrated about having things breaking in her new home.  She is medically compliant.  She has been losing weight approximately 30 pounds in the last 4 months, she exercises every day in her pool & walks.  She is avoiding fried foods. She is feeling better about her food choices and her weight loss.  The patient does not have symptoms concerning for COVID-19 infection (fever, chills, cough, or new shortness of breath).    Past Medical History:  Diagnosis Date  . Bronchitis, asthmatic   . Coronary artery disease    BMS to LAD 02/2008  . Diabetes mellitus without complication ([place])   . Hyperlipidemia   . Hypertensive heart disease 04/16/2016  . Hypothyroidism   . MI, old   . Morbid obesity ([place]) 04/16/2016  . S/P coronary artery stent placement 04/16/2016   BMS to LAD 2009  . Tobacco abuse 04/16/2016   Past Surgical History:  Procedure Laterality Date  . ABDOMINAL HYSTERECTOMY    . ANKLE SURGERY Right   . CHOLECYSTECTOMY    . CORONARY ANGIOPLASTY  02/2008   bare metal stent to LAD   . CORONARY STENT PLACEMENT    . KNEE ARTHROSCOPY    . ROTATOR CUFF REPAIR Bilateral   . THORACIC OUTLET SURGERY    . WRIST SURGERY       Current Meds  Medication Sig  . albuterol (ACCUNEB) 1.25 MG/3ML nebulizer solution Take 3 mLs (1.25 mg total) by nebulization 3 (three) times daily as needed for wheezing.  Marland Kitchen albuterol (PROAIR  HFA) 108 (90 Base) MCG/ACT inhaler Inhale 2 puffs into the lungs every 6 (six) hours as needed for wheezing or shortness of breath.  . ALPRAZolam (XANAX) 0.5 MG tablet Take 1 tablet (0.5 mg total) by mouth 2 (two) times daily as needed for anxiety.  Marland Kitchen aspirin 81 MG tablet Take 81 mg by mouth daily.  Marland Kitchen atorvastatin (LIPITOR) 10 MG tablet TAKE 1 TABLET(10 MG) BY MOUTH DAILY (Patient taking differently: Take 10 mg by mouth daily. )  . budesonide-formoterol (SYMBICORT) 80-4.5 MCG/ACT inhaler Inhale 2 puffs into the lungs 2 (two) times daily.  . clopidogrel (PLAVIX) 75 MG  tablet TAKE 1 TABLET(75 MG) BY MOUTH DAILY (Patient taking differently: Take 75 mg by mouth daily. )  . diclofenac (VOLTAREN) 75 MG EC tablet Take 75 mg by mouth 2 (two) times daily.   . Diclofenac Sodium 2 % SOLN Use times daily as needed.  . furosemide (LASIX) 20 MG tablet TAKE 1 TABLET(20 MG) BY MOUTH DAILY (Patient taking differently: Take 20 mg by mouth daily. )  . glucose blood test strip Use as instructed  . INVOKANA 300 MG TABS tablet TAKE 1 TABLET BY MOUTH DAILY (Patient taking differently: Take 300 mg by mouth daily. )  . ipratropium (ATROVENT) 0.06 % nasal spray Place 2 sprays into both nostrils 4 (four) times daily.  Marland Kitchen levothyroxine (SYNTHROID) 112 MCG tablet TAKE 1 TABLET(112 MCG) BY MOUTH DAILY  . metFORMIN (GLUCOPHAGE) 500 MG tablet TAKE 1 TABLET(500 MG) BY MOUTH DAILY  . predniSONE (DELTASONE) 50 MG tablet Take 1 tablet daily for 5 days. Then take 1/2 tablet daily for 2 days.  Marland Kitchen spironolactone (ALDACTONE) 25 MG tablet TAKE 1 TABLET(25 MG) BY MOUTH DAILY (Patient taking differently: Take 25 mg by mouth daily. )     Allergies:   Clindamycin/lincomycin, Codeine, Dilaudid [hydromorphone hcl], Iodides, Reglan [metoclopramide], Tramadol, and Tape   Social History   Tobacco Use  . Smoking status: Current Every Day Smoker    Packs/day: 0.50    Years: 31.00    Pack years: 15.50  . Smokeless tobacco: Never Used  Substance Use Topics  . Alcohol use: No  . Drug use: No     Family Hx: The patient's family history includes Cancer in her mother; Diabetes in her son; Hypertension in her son.  ROS:   Please see the history of present illness.    All other systems reviewed and are negative.   Prior CV studies:   The following studies were reviewed today:    Labs/Other Tests and Data Reviewed:    EKG:  No ECG reviewed.  Recent Labs: 02/04/2019: TSH 1.72 02/15/2019: ALT 24; Magnesium 1.5 02/16/2019: BUN 22; Creatinine, Ser 1.34; Hemoglobin 15.0; Platelets 160; Potassium 3.6;  Sodium 139   Recent Lipid Panel Lab Results  Component Value Date/Time   CHOL 111 02/15/2019 09:44 PM   TRIG 240 (H) 02/15/2019 09:44 PM   HDL 35 (L) 02/15/2019 09:44 PM   CHOLHDL 3.2 02/15/2019 09:44 PM   LDLCALC 28 02/15/2019 09:44 PM   LDLDIRECT 55.0 02/04/2019 11:03 AM    Wt Readings from Last 3 Encounters:  03/16/19 272 lb (123.4 kg)  03/15/19 276 lb 6.4 oz (125.4 kg)  03/01/19 286 lb (129.7 kg)     Objective:    Vital Signs:  BP 120/72   Ht 5\' 5"  (1.651 m)   Wt 272 lb (123.4 kg)   BMI 45.26 kg/m    VITAL SIGNS:  reviewed GEN:  no acute distress  RESPIRATORY:  normal respiratory effort, symmetric expansion NEURO:  alert and oriented x 3, no obvious focal deficit PSYCH:  normal affect  ASSESSMENT & PLAN:    1.  Coronary artery disease: Status post coronary artery PCI placement to LAD in 2009.  The patient denies any recurrent chest pain.  She was seen for evaluation of chest discomfort recently and it was found to be musculoskeletal related.  She is without any further complaints now and has no soreness residually.  She will continue current medication regimen.  2.  Hypertension: Blood pressure is well controlled today.  We will continue current medication regimen of spironolactone 25 mg daily.   3.  Obesity: She has lost 30 pounds with lifestyle changes to include diet and exercise.  She is congratulated on her progress and encouraged to continue.  4.  Ongoing tobacco abuse: Smoking cessation is strongly recommended.  She verbalizes understanding.  5.  COPD: Is explained to her is especially important to quit smoking in the setting of her lung disease.  She denies any severe shortness of breath and is using her inhalers as directed.  She is advised on the importance of protecting herself against COVID-19 in the setting of chronic lung disease.  She verbalizes understanding.    COVID-19 Education: The signs and symptoms of COVID-19 were discussed with the patient  and how to seek care for testing (follow up with PCP or arrange E-visit). The importance of social distancing was discussed today.  Time:   Today, I have spent 15 minutes with the patient with telehealth technology discussing the above problems.     Medication Adjustments/Labs and Tests Ordered: Current medicines are reviewed at length with the patient today.  Concerns regarding medicines are outlined above.   Tests Ordered: No orders of the defined types were placed in this encounter.   Medication Changes: No orders of the defined types were placed in this encounter.   Disposition:  Follow up 6 months  Signed, Phill Myron. West Pugh, ANP, AACC  03/16/2019 3:37 PM    [facility]

## 2019-03-21 ENCOUNTER — Other Ambulatory Visit: Payer: Self-pay | Admitting: Family Medicine

## 2019-03-21 DIAGNOSIS — R600 Localized edema: Secondary | ICD-10-CM

## 2019-04-22 ENCOUNTER — Other Ambulatory Visit: Payer: Self-pay | Admitting: Family Medicine

## 2019-04-22 ENCOUNTER — Other Ambulatory Visit: Payer: Self-pay | Admitting: Cardiovascular Disease

## 2019-04-22 DIAGNOSIS — R600 Localized edema: Secondary | ICD-10-CM

## 2019-04-29 ENCOUNTER — Ambulatory Visit: Payer: Medicare HMO | Admitting: Family Medicine

## 2019-05-04 ENCOUNTER — Other Ambulatory Visit: Payer: Self-pay

## 2019-05-04 ENCOUNTER — Encounter: Payer: Self-pay | Admitting: Family Medicine

## 2019-05-04 ENCOUNTER — Ambulatory Visit (INDEPENDENT_AMBULATORY_CARE_PROVIDER_SITE_OTHER): Payer: Medicare HMO | Admitting: Family Medicine

## 2019-05-04 VITALS — BP 140/68 | HR 93 | Temp 98.1°F | Ht 65.0 in | Wt 272.8 lb

## 2019-05-04 DIAGNOSIS — E1169 Type 2 diabetes mellitus with other specified complication: Secondary | ICD-10-CM | POA: Diagnosis not present

## 2019-05-04 DIAGNOSIS — F419 Anxiety disorder, unspecified: Secondary | ICD-10-CM

## 2019-05-04 DIAGNOSIS — M25532 Pain in left wrist: Secondary | ICD-10-CM

## 2019-05-04 DIAGNOSIS — E039 Hypothyroidism, unspecified: Secondary | ICD-10-CM | POA: Diagnosis not present

## 2019-05-04 DIAGNOSIS — E785 Hyperlipidemia, unspecified: Secondary | ICD-10-CM

## 2019-05-04 DIAGNOSIS — E1142 Type 2 diabetes mellitus with diabetic polyneuropathy: Secondary | ICD-10-CM

## 2019-05-04 DIAGNOSIS — F17210 Nicotine dependence, cigarettes, uncomplicated: Secondary | ICD-10-CM | POA: Diagnosis not present

## 2019-05-04 DIAGNOSIS — E1159 Type 2 diabetes mellitus with other circulatory complications: Secondary | ICD-10-CM

## 2019-05-04 DIAGNOSIS — D751 Secondary polycythemia: Secondary | ICD-10-CM | POA: Diagnosis not present

## 2019-05-04 DIAGNOSIS — M79644 Pain in right finger(s): Secondary | ICD-10-CM

## 2019-05-04 DIAGNOSIS — I10 Essential (primary) hypertension: Secondary | ICD-10-CM

## 2019-05-04 DIAGNOSIS — E118 Type 2 diabetes mellitus with unspecified complications: Secondary | ICD-10-CM | POA: Diagnosis not present

## 2019-05-04 DIAGNOSIS — I152 Hypertension secondary to endocrine disorders: Secondary | ICD-10-CM

## 2019-05-04 MED ORDER — ALPRAZOLAM 0.5 MG PO TABS: 0.5000 mg | ORAL_TABLET | Freq: Two times a day (BID) | ORAL | 3 refills | Status: DC | PRN

## 2019-05-04 NOTE — Progress Notes (Addendum)
[name] [name] is a 68 y.o. female is here for follow up.  History of Present Illness:   HPI:   1. Cigarette nicotine dependence without complication  2. Hyperlipidemia associated with type 2 diabetes mellitus ([place]), on Lipitor Lab Results  Component Value Date   CHOL 111 02/15/2019   HDL 35 (L) 02/15/2019   LDLCALC 28 02/15/2019   LDLDIRECT 55.0 02/04/2019   TRIG 240 (H) 02/15/2019   CHOLHDL 3.2 02/15/2019   Lab Results  Component Value Date   ALT 24 02/15/2019   AST 19 02/15/2019   ALKPHOS 75 02/15/2019   BILITOT 0.6 02/15/2019   3. Diabetic peripheral neuropathy associated with type 2 diabetes mellitus ([place]), on Gabapentin  4. Hypertension associated with diabetes ([place])  BP Readings from Last 3 Encounters:  05/04/19 140/68  03/16/19 120/72  03/15/19 118/72   Lab Results  Component Value Date   CREATININE 1.34 (H) 02/16/2019   5. Polycythemia, secondary, followed by Heme, therapeutic phlebotomy  6. Anxiety, uses prn Xanax  7. Hypothyroidism (acquired), stable on Levothyroxine Lab Results  Component Value Date   TSH 1.72 02/04/2019   FREET4 1.14 03/15/2018   Lab Results  Component Value Date   TSH 1.72 02/04/2019   TSH 0.87 07/19/2018   TSH 0.62 03/15/2018   TSH 0.59 02/01/2018   TSH 0.39 12/01/2017   TSH 0.35 08/28/2017   TSH 0.15 (L) 05/27/2017   TSH 0.22 (L) 01/28/2017   FREET4 1.14 03/15/2018   FREET4 1.09 02/01/2018   FREET4 1.22 08/28/2017   FREET4 1.40 05/27/2017   FREET4 1.09 01/28/2017    8. DM (diabetes mellitus) with complications ([place]), on Metformin and Invokana Lab Results  Component Value Date   HGBA1C 7.6 (H) 02/04/2019   Lab Results  Component Value Date   MICROALBUR <0.7 02/04/2019   9. Morbid (severe) obesity due to excess calories ([place]) Wt Readings from Last 3 Encounters:  05/04/19 272 lb 12.8 oz (123.7 kg)  03/16/19 272 lb (123.4 kg)  03/15/19 276 lb 6.4 oz (125.4 kg)   Health Maintenance Due  Topic Date Due  .  DEXA SCAN  06/16/2018  . PNA vac Low Risk Adult (1 of 2 - PCV13) 06/16/2018  . PAP SMEAR-Modifier  09/30/2018  . OPHTHALMOLOGY EXAM  10/02/2018  . FOOT EXAM  12/02/2018  . MAMMOGRAM  12/17/2018  . INFLUENZA VACCINE  04/23/2019   Depression screen [facility] 2/9 05/04/2019 10/18/2018 10/18/2018  Decreased Interest 0 0 0  Down, Depressed, Hopeless 1 1 1   PHQ - 2 Score 1 1 1   Altered sleeping 0 0 0  Tired, decreased energy 0 0 0  Change in appetite 0 1 1  Feeling bad or failure about yourself  0 0 0  Trouble concentrating 0 0 0  Moving slowly or fidgety/restless 0 0 0  Suicidal thoughts 0 0 0  PHQ-9 Score 1 2 2   Difficult doing work/chores Not difficult at all Not difficult at all Not difficult at all   PMHx, SurgHx, SocialHx, FamHx, Medications, and Allergies were reviewed in the Visit Navigator and updated as appropriate.   Patient Active Problem List   Diagnosis Date Noted  . Pain in finger of right hand 05/06/2019  . Left wrist pain 05/06/2019  . B12 deficiency 10/22/2018  . Rosacea 10/21/2018  . Bilateral lower extremity edema 10/21/2018  . Left shoulder pain 07/21/2018  . Diabetic peripheral neuropathy associated with type 2 diabetes mellitus ([place]), on Gabapentin 04/18/2018  . Gout, intermittent, prn  colchicine 04/18/2018  . Balance problem, uses cane 03/17/2018  . Hypertension associated with diabetes ([place]) 03/14/2018  . Hepatic steatosis 03/14/2018  . Polycythemia, secondary, followed by Heme, therapeutic phlebotomy 10/15/2017  . Anxiety, uses prn Xanax 05/27/2017  . Mild obstructive sleep apnea-hypopnea syndrome but with sig desats, not on CPAP 01/28/2017  . DM (diabetes mellitus) with complications ([place]), on Metformin and Invokana   . CAD, BMS to LAD 02/2008   . Bronchitis, asthmatic   . MI, old   . Hypothyroidism (acquired), stable on Levothyroxine   . COPD GOLD 0/ prob AB still smoking  11/07/2016  . S/P coronary artery stent placement 04/16/2016  . Hypertensive heart  disease 04/16/2016  . Hyperlipidemia associated with type 2 diabetes mellitus ([place]), on Lipitor 04/16/2016  . Nicotine dependence, cigarrettes, precontemplative 04/16/2016  . Morbid (severe) obesity due to excess calories ([place]) 04/16/2016   Social History   Tobacco Use  . Smoking status: Current Every Day Smoker    Packs/day: 0.50    Years: 31.00    Pack years: 15.50  . Smokeless tobacco: Never Used  Substance Use Topics  . Alcohol use: No  . Drug use: No   Current Medications and Allergies   .  albuterol (ACCUNEB) 1.25 MG/3ML nebulizer solution, Take 3 mLs (1.25 mg total) by nebulization 3 (three) times daily as needed for wheezing., Disp: 75 mL, Rfl: 2 .  albuterol (PROAIR HFA) 108 (90 Base) MCG/ACT inhaler, Inhale 2 puffs into the lungs every 6 (six) hours as needed for wheezing or shortness of breath., Disp: 1 Inhaler, Rfl: 11 .  ALPRAZolam (XANAX) 0.5 MG tablet, Take 1 tablet (0.5 mg total) by mouth 2 (two) times daily as needed for anxiety., Disp: 60 tablet, Rfl: 3 .  aspirin 81 MG tablet, Take 81 mg by mouth daily., Disp: , Rfl:  .  atorvastatin (LIPITOR) 10 MG tablet, Take 1 tablet (10 mg total) by mouth daily., Disp: 90 tablet, Rfl: 1 .  clopidogrel (PLAVIX) 75 MG tablet, TAKE 1 TABLET(75 MG) BY MOUTH DAILY, Disp: 90 tablet, Rfl: 0 .  diclofenac (VOLTAREN) 75 MG EC tablet, Take 75 mg by mouth 2 (two) times daily. , Disp: , Rfl:  .  Diclofenac Sodium 2 % SOLN, Use times daily as needed., Disp: 112 g, Rfl: 0 .  furosemide (LASIX) 20 MG tablet, TAKE 1 TABLET(20 MG) BY MOUTH DAILY, Disp: 30 tablet, Rfl: 3 .  glucose blood test strip, Use as instructed, Disp: 100 each, Rfl: 12 .  INVOKANA 300 MG TABS tablet, TAKE 1 TABLET BY MOUTH DAILY (Patient taking differently: Take 300 mg by mouth daily. ), Disp: 90 tablet, Rfl: 1 .  ipratropium (ATROVENT) 0.06 % nasal spray, Place 2 sprays into both nostrils 4 (four) times daily., Disp: 15 mL, Rfl: 11 .  levothyroxine (SYNTHROID) 112 MCG  tablet, TAKE 1 TABLET(112 MCG) BY MOUTH DAILY, Disp: 90 tablet, Rfl: 0 .  metFORMIN (GLUCOPHAGE) 500 MG tablet, TAKE 1 TABLET(500 MG) BY MOUTH DAILY, Disp: 90 tablet, Rfl: 0 .  predniSONE (DELTASONE) 50 MG tablet, Take 1 tablet daily for 5 days. Then take 1/2 tablet daily for 2 days., Disp: 6 tablet, Rfl: 0 .  spironolactone (ALDACTONE) 25 MG tablet, TAKE 1 TABLET(25 MG) BY MOUTH DAILY, Disp: 90 tablet, Rfl: 1 .  SYMBICORT 80-4.5 MCG/ACT inhaler, INHALE 2 PUFFS INTO THE LUNGS TWICE DAILY, Disp: 10.2 g, Rfl: 3   Allergies  Allergen Reactions  . Clindamycin/Lincomycin Other (See Comments)    Head ache   .  Codeine Hives  . Dilaudid [Hydromorphone Hcl] Nausea And Vomiting  . Iodides   . Reglan [Metoclopramide] Other (See Comments)    Other reaction(s): Dystonia Hallucinations/Violent  . Tramadol   . Tape Rash    Blisters    Review of Systems   Pertinent items are noted in the HPI. Otherwise, a complete ROS is negative.  Vitals   Vitals:   05/04/19 1550  BP: 140/68  Pulse: 93  Temp: 98.1 F (36.7 C)  TempSrc: Oral  Weight: 272 lb 12.8 oz (123.7 kg)  Height: 5\' 5"  (1.651 m)     Body mass index is 45.4 kg/m.  Physical Exam   Physical Exam Vitals signs and nursing note reviewed.  Constitutional:      General: She is not in acute distress.    Appearance: She is obese.  HENT:     Head: Normocephalic and atraumatic.     Right Ear: External ear normal.     Left Ear: External ear normal.     Nose: Nose normal.     Mouth/Throat:     Mouth: Mucous membranes are moist.  Eyes:     Conjunctiva/sclera: Conjunctivae normal.     Pupils: Pupils are equal, round, and reactive to light.  Neck:     Musculoskeletal: Normal range of motion and neck supple.  Cardiovascular:     Rate and Rhythm: Normal rate and regular rhythm.     Heart sounds: Normal heart sounds.  Pulmonary:     Effort: Pulmonary effort is normal. No accessory muscle usage.     Breath sounds: Decreased air  movement present. No wheezing, rhonchi or rales.  Abdominal:     Palpations: Abdomen is soft.  Musculoskeletal:     Comments: TTP, decreased ROM left wrist. TTP, enlarged MCP right hand.  Skin:    General: Skin is warm.     Comments: Skin color, red, especially face. Rosacea nose.   Neurological:     General: No focal deficit present.     Mental Status: She is alert.  Psychiatric:        Mood and Affect: Mood normal.        Behavior: Behavior normal.        Thought Content: Thought content normal.        Judgment: Judgment normal.    Diabetic Foot Exam - Simple   Simple Foot Form Diabetic Foot exam was performed with the following findings: Yes 05/04/2019 12:21 PM  Visual Inspection No deformities, no ulcerations, no other skin breakdown bilaterally: Yes Sensation Testing Intact to touch and monofilament testing bilaterally: Yes Pulse Check Posterior Tibialis and Dorsalis pulse intact bilaterally: Yes Comments    Assessment and Plan   [name] [name] was seen today for follow-up.  Diagnoses and all orders for this visit:  DM (diabetes mellitus) with complications ([place]), on Metformin and Invokana -     Hemoglobin A1c; Future  Cigarette nicotine dependence without complication Comments: Precontemplative.  Hyperlipidemia associated with type 2 diabetes mellitus ([place]), on Lipitor -     Comprehensive metabolic panel; Future -     Lipid panel; Future  Diabetic peripheral neuropathy associated with type 2 diabetes mellitus ([place]), on Gabapentin  Hypertension associated with diabetes ([place])  Polycythemia, secondary, followed by Heme, therapeutic phlebotomy -     CBC with Differential/Platelet; Future  Hypothyroidism (acquired), stable on Levothyroxine -     TSH; Future -     T4, free; Future  Morbid (severe) obesity due to excess calories ([place])  Anxiety, uses prn Xanax Comments: Okay to go back to Xanax. Discussed using sparingly. Offered therapy and another medication to help  with vegetative symptoms.  Orders: -     ALPRAZolam (XANAX) 0.5 MG tablet; Take 1 tablet (0.5 mg total) by mouth 2 (two) times daily as needed for anxiety.  Pain in finger of right hand -     DG Hand Complete Right; Future  Left wrist pain -     DG Wrist Complete Left; Future    . Orders and follow up as documented in [place], reviewed diet, exercise and weight control, cardiovascular risk and specific lipid/LDL goals reviewed, reviewed medications and side effects in detail.  . Reviewed expectations re: course of current medical issues. . Outlined signs and symptoms indicating need for more acute intervention. . Patient verbalized understanding and all questions were answered. . Patient received an After Visit Summary.  Briscoe Deutscher, DO , [place] 05/06/2019  Records requested if needed. Time spent: 25 minutes, of which >50% was spent in obtaining information about her symptoms, reviewing her previous labs, evaluations, and treatments, counseling her about her condition (please see the discussed topics above), and developing a plan to further investigate it; she had a number of questions which I addressed.

## 2019-05-05 ENCOUNTER — Telehealth: Payer: Self-pay | Admitting: Family Medicine

## 2019-05-05 NOTE — Telephone Encounter (Signed)
Pt forgot to ask if she could get a new meter to test her blood sugar.  Pt would like to get one right from the office instead of using her insurance.

## 2019-05-06 ENCOUNTER — Other Ambulatory Visit: Payer: Self-pay | Admitting: Family Medicine

## 2019-05-06 ENCOUNTER — Other Ambulatory Visit: Payer: Self-pay

## 2019-05-06 DIAGNOSIS — M79644 Pain in right finger(s): Secondary | ICD-10-CM | POA: Insufficient documentation

## 2019-05-06 DIAGNOSIS — M25532 Pain in left wrist: Secondary | ICD-10-CM | POA: Insufficient documentation

## 2019-05-06 MED ORDER — BLOOD GLUCOSE MONITOR KIT: PACK | 0 refills | Status: DC

## 2019-05-06 NOTE — Addendum Note (Signed)
Addended by: Briscoe Deutscher R on: 05/06/2019 12:36 PM   Modules accepted: Orders

## 2019-05-06 NOTE — Telephone Encounter (Signed)
Called patient let her know options she would like to have script sent to pharmacy have faxed. Will let me know if any issues.

## 2019-05-06 NOTE — Telephone Encounter (Signed)
Pt want to know the status of her request and stated no one has called her. Please call pt

## 2019-05-06 NOTE — Telephone Encounter (Signed)
Pt called back in to follow up on message sent. Pt is requesting a call back at the phone number on file to be advised.

## 2019-05-09 NOTE — Telephone Encounter (Signed)
Last OV 05/04/19 Next OV 05/11/19

## 2019-05-11 ENCOUNTER — Other Ambulatory Visit: Payer: Medicare HMO

## 2019-05-16 ENCOUNTER — Ambulatory Visit (INDEPENDENT_AMBULATORY_CARE_PROVIDER_SITE_OTHER): Payer: Medicare HMO

## 2019-05-16 ENCOUNTER — Other Ambulatory Visit: Payer: Self-pay

## 2019-05-16 ENCOUNTER — Other Ambulatory Visit (INDEPENDENT_AMBULATORY_CARE_PROVIDER_SITE_OTHER): Payer: Medicare HMO

## 2019-05-16 DIAGNOSIS — E039 Hypothyroidism, unspecified: Secondary | ICD-10-CM

## 2019-05-16 DIAGNOSIS — D751 Secondary polycythemia: Secondary | ICD-10-CM | POA: Diagnosis not present

## 2019-05-16 DIAGNOSIS — M79644 Pain in right finger(s): Secondary | ICD-10-CM

## 2019-05-16 DIAGNOSIS — M11241 Other chondrocalcinosis, right hand: Secondary | ICD-10-CM | POA: Diagnosis not present

## 2019-05-16 DIAGNOSIS — E118 Type 2 diabetes mellitus with unspecified complications: Secondary | ICD-10-CM

## 2019-05-16 DIAGNOSIS — E1142 Type 2 diabetes mellitus with diabetic polyneuropathy: Secondary | ICD-10-CM

## 2019-05-16 DIAGNOSIS — M25532 Pain in left wrist: Secondary | ICD-10-CM

## 2019-05-16 DIAGNOSIS — E1169 Type 2 diabetes mellitus with other specified complication: Secondary | ICD-10-CM | POA: Diagnosis not present

## 2019-05-16 DIAGNOSIS — R300 Dysuria: Secondary | ICD-10-CM

## 2019-05-16 DIAGNOSIS — E785 Hyperlipidemia, unspecified: Secondary | ICD-10-CM | POA: Diagnosis not present

## 2019-05-16 DIAGNOSIS — M11232 Other chondrocalcinosis, left wrist: Secondary | ICD-10-CM | POA: Diagnosis not present

## 2019-05-16 DIAGNOSIS — E1165 Type 2 diabetes mellitus with hyperglycemia: Secondary | ICD-10-CM

## 2019-05-16 LAB — CBC WITH DIFFERENTIAL/PLATELET
Basophils Absolute: 0.1 10*3/uL (ref 0.0–0.1)
Basophils Relative: 0.9 % (ref 0.0–3.0)
Eosinophils Absolute: 0.2 10*3/uL (ref 0.0–0.7)
Eosinophils Relative: 2.4 % (ref 0.0–5.0)
HCT: 50.5 % — ABNORMAL HIGH (ref 36.0–46.0)
Hemoglobin: 16.6 g/dL — ABNORMAL HIGH (ref 12.0–15.0)
Lymphocytes Relative: 26.6 % (ref 12.0–46.0)
Lymphs Abs: 1.9 10*3/uL (ref 0.7–4.0)
MCHC: 32.8 g/dL (ref 30.0–36.0)
MCV: 97.3 fl (ref 78.0–100.0)
Monocytes Absolute: 0.5 10*3/uL (ref 0.1–1.0)
Monocytes Relative: 7.1 % (ref 3.0–12.0)
Neutro Abs: 4.6 10*3/uL (ref 1.4–7.7)
Neutrophils Relative %: 63 % (ref 43.0–77.0)
Platelets: 189 10*3/uL (ref 150.0–400.0)
RBC: 5.19 Mil/uL — ABNORMAL HIGH (ref 3.87–5.11)
RDW: 14.7 % (ref 11.5–15.5)
WBC: 7.2 10*3/uL (ref 4.0–10.5)

## 2019-05-16 LAB — URINALYSIS, ROUTINE W REFLEX MICROSCOPIC
Bilirubin Urine: NEGATIVE
Hgb urine dipstick: NEGATIVE
Ketones, ur: NEGATIVE
Leukocytes,Ua: NEGATIVE
Nitrite: NEGATIVE
RBC / HPF: NONE SEEN (ref 0–?)
Specific Gravity, Urine: 1.015 (ref 1.000–1.030)
Total Protein, Urine: NEGATIVE
Urine Glucose: >=1000 — AB
Urobilinogen, UA: 0.2 (ref 0.0–1.0)
pH: 6 (ref 5.0–8.0)

## 2019-05-16 LAB — COMPREHENSIVE METABOLIC PANEL
ALT: 18 U/L (ref 0–35)
AST: 16 U/L (ref 0–37)
Albumin: 4.2 g/dL (ref 3.5–5.2)
Alkaline Phosphatase: 85 U/L (ref 39–117)
BUN: 22 mg/dL (ref 6–23)
CO2: 31 mEq/L (ref 19–32)
Calcium: 9.6 mg/dL (ref 8.4–10.5)
Chloride: 102 mEq/L (ref 96–112)
Creatinine, Ser: 1.38 mg/dL — ABNORMAL HIGH (ref 0.40–1.20)
GFR: 38.26 mL/min — ABNORMAL LOW (ref >60.00)
Glucose, Bld: 149 mg/dL — ABNORMAL HIGH (ref 70–99)
Potassium: 4.1 mEq/L (ref 3.5–5.1)
Sodium: 141 mEq/L (ref 135–145)
Total Bilirubin: 0.8 mg/dL (ref 0.2–1.2)
Total Protein: 6.6 g/dL (ref 6.0–8.3)

## 2019-05-16 LAB — LIPID PANEL
Cholesterol: 137 mg/dL (ref 0–200)
HDL: 36.9 mg/dL — ABNORMAL LOW (ref >39.00)
NonHDL: 100.35
Total CHOL/HDL Ratio: 4
Triglycerides: 230 mg/dL — ABNORMAL HIGH (ref 0.0–149.0)
VLDL: 46 mg/dL — ABNORMAL HIGH (ref 0.0–40.0)

## 2019-05-16 LAB — MICROALBUMIN / CREATININE URINE RATIO
Creatinine,U: 118.5 mg/dL
Microalb Creat Ratio: 0.6 mg/g (ref 0.0–30.0)
Microalb, Ur: <0.7 mg/dL (ref 0.0–1.9)

## 2019-05-16 LAB — LDL CHOLESTEROL, DIRECT: Direct LDL: 60 mg/dL

## 2019-05-16 LAB — HEMOGLOBIN A1C: Hgb A1c MFr Bld: 7 % — ABNORMAL HIGH (ref 4.6–6.5)

## 2019-05-16 NOTE — Addendum Note (Signed)
Addended by: Francis Dowse T on: 05/16/2019 12:16 PM   Modules accepted: Orders

## 2019-05-17 LAB — T4, FREE: Free T4: 1.34 ng/dL (ref 0.60–1.60)

## 2019-05-17 LAB — TSH: TSH: 0.32 u[IU]/mL — ABNORMAL LOW (ref 0.35–4.50)

## 2019-05-18 LAB — URINE CULTURE
MICRO NUMBER:: 804376
SPECIMEN QUALITY:: ADEQUATE

## 2019-05-20 ENCOUNTER — Other Ambulatory Visit: Payer: Self-pay | Admitting: Family Medicine

## 2019-05-20 DIAGNOSIS — E039 Hypothyroidism, unspecified: Secondary | ICD-10-CM

## 2019-06-01 ENCOUNTER — Other Ambulatory Visit: Payer: Self-pay

## 2019-06-01 ENCOUNTER — Encounter: Payer: Self-pay | Admitting: Family Medicine

## 2019-06-01 ENCOUNTER — Ambulatory Visit (INDEPENDENT_AMBULATORY_CARE_PROVIDER_SITE_OTHER): Payer: Medicare HMO | Admitting: Family Medicine

## 2019-06-01 VITALS — BP 126/64 | HR 96 | Temp 98.6°F | Ht 65.0 in | Wt 275.2 lb

## 2019-06-01 DIAGNOSIS — F419 Anxiety disorder, unspecified: Secondary | ICD-10-CM

## 2019-06-01 DIAGNOSIS — E039 Hypothyroidism, unspecified: Secondary | ICD-10-CM | POA: Diagnosis not present

## 2019-06-01 DIAGNOSIS — E1165 Type 2 diabetes mellitus with hyperglycemia: Secondary | ICD-10-CM

## 2019-06-01 DIAGNOSIS — F17210 Nicotine dependence, cigarettes, uncomplicated: Secondary | ICD-10-CM

## 2019-06-01 DIAGNOSIS — G4733 Obstructive sleep apnea (adult) (pediatric): Secondary | ICD-10-CM

## 2019-06-01 DIAGNOSIS — E1142 Type 2 diabetes mellitus with diabetic polyneuropathy: Secondary | ICD-10-CM | POA: Diagnosis not present

## 2019-06-01 DIAGNOSIS — E1169 Type 2 diabetes mellitus with other specified complication: Secondary | ICD-10-CM

## 2019-06-01 DIAGNOSIS — D751 Secondary polycythemia: Secondary | ICD-10-CM

## 2019-06-01 DIAGNOSIS — Z23 Encounter for immunization: Secondary | ICD-10-CM

## 2019-06-01 MED ORDER — INVOKANA 300 MG PO TABS: 300.0000 mg | ORAL_TABLET | Freq: Every day | ORAL | 1 refills | Status: DC

## 2019-06-01 MED ORDER — TRULICITY 0.75 MG/0.5ML ~~LOC~~ SOAJ: 0.7500 mg | SUBCUTANEOUS | 0 refills | Status: DC

## 2019-06-01 MED ORDER — ONDANSETRON 4 MG PO TBDP: 4.0000 mg | ORAL_TABLET | Freq: Three times a day (TID) | ORAL | 0 refills | Status: DC | PRN

## 2019-06-01 NOTE — Patient Instructions (Addendum)
You have an appointment scheduled for: []   2D Mammogram  [x]   3D Mammogram  [x]   Bone Density   On            At    Your appointment will at the following location  []   [facility]      [place], [place]        Make sure to wear two peace clothing  No lotions powders or deodorants the day of the appointment Make sure to bring picture ID and insurance card.  Bring list of medications you are currently taking including any supplements.

## 2019-06-01 NOTE — Progress Notes (Signed)
[name] [name] is a 68 y.o. female is here for follow up.  History of Present Illness:   HPI:   1. Type 2 diabetes mellitus with other specified complication, without long-term current use of insulin (HCC) Lab Results  Component Value Date   HGBA1C 7.0 (H) 05/16/2019   HGBA1C 7.6 (H) 02/04/2019   HGBA1C 7.9 (H) 10/21/2018   Lab Results  Component Value Date   MICROALBUR <0.7 05/16/2019   LDLCALC 28 02/15/2019   CREATININE 1.38 (H) 05/16/2019   2. Need for immunization against influenza Okay for vaccination today.  3. Diabetic peripheral neuropathy associated with type 2 diabetes mellitus ([place]), on Gabapentin  4. Anxiety, uses prn Xanax  5. Mild obstructive sleep apnea-hypopnea syndrome but with sig desats, not on CPAP  Health Maintenance Due  Topic Date Due  . DEXA SCAN  06/16/2018  . PNA vac Low Risk Adult (1 of 2 - PCV13) 06/16/2018  . OPHTHALMOLOGY EXAM  10/02/2018  . MAMMOGRAM  12/17/2018   Depression screen [facility] 2/9 05/04/2019 10/18/2018 10/18/2018  Decreased Interest 0 0 0  Down, Depressed, Hopeless '1 1 1  '$ PHQ - 2 Score '1 1 1  '$ Altered sleeping 0 0 0  Tired, decreased energy 0 0 0  Change in appetite 0 1 1  Feeling bad or failure about yourself  0 0 0  Trouble concentrating 0 0 0  Moving slowly or fidgety/restless 0 0 0  Suicidal thoughts 0 0 0  PHQ-9 Score '1 2 2  '$ Difficult doing work/chores Not difficult at all Not difficult at all Not difficult at all   PMHx, SurgHx, SocialHx, FamHx, Medications, and Allergies were reviewed in the Visit Navigator and updated as appropriate.   Patient Active Problem List   Diagnosis Date Noted  . Pain in finger of right hand 05/06/2019  . Left wrist pain 05/06/2019  . B12 deficiency 10/22/2018  . Rosacea 10/21/2018  . Bilateral lower extremity edema 10/21/2018  . Left shoulder pain 07/21/2018  . Diabetic peripheral neuropathy associated with type 2 diabetes mellitus ([place]), on Gabapentin 04/18/2018  . Gout, intermittent,  prn colchicine 04/18/2018  . Balance problem, uses cane 03/17/2018  . Hypertension associated with diabetes ([place]) 03/14/2018  . Hepatic steatosis 03/14/2018  . Polycythemia, secondary, followed by Heme, therapeutic phlebotomy 10/15/2017  . Anxiety, uses prn Xanax 05/27/2017  . Mild obstructive sleep apnea-hypopnea syndrome but with sig desats, not on CPAP 01/28/2017  . DM (diabetes mellitus) with complications ([place]), on Metformin and Invokana   . CAD, BMS to LAD 02/2008   . Bronchitis, asthmatic   . MI, old   . Hypothyroidism (acquired), stable on Levothyroxine   . COPD GOLD 0/ prob AB still smoking  11/07/2016  . S/P coronary artery stent placement 04/16/2016  . Hypertensive heart disease 04/16/2016  . Hyperlipidemia associated with type 2 diabetes mellitus ([place]), on Lipitor 04/16/2016  . Nicotine dependence, cigarrettes, precontemplative 04/16/2016  . Morbid (severe) obesity due to excess calories ([place]) 04/16/2016   Social History   Tobacco Use  . Smoking status: Current Every Day Smoker    Packs/day: 0.50    Years: 31.00    Pack years: 15.50  . Smokeless tobacco: Never Used  Substance Use Topics  . Alcohol use: No  . Drug use: No   Current Medications and Allergies   Current Outpatient Medications:  .  Accu-Chek FastClix Lancets MISC, USE TO TEST UP TO FOUR TIMES DAILY AS DIRECTED, Disp: 306 each, Rfl: 2 .  albuterol (  ACCUNEB) 1.25 MG/3ML nebulizer solution, Take 3 mLs (1.25 mg total) by nebulization 3 (three) times daily as needed for wheezing., Disp: 75 mL, Rfl: 2 .  albuterol (PROAIR HFA) 108 (90 Base) MCG/ACT inhaler, Inhale 2 puffs into the lungs every 6 (six) hours as needed for wheezing or shortness of breath., Disp: 1 Inhaler, Rfl: 11 .  ALPRAZolam (XANAX) 0.5 MG tablet, Take 1 tablet (0.5 mg total) by mouth 2 (two) times daily as needed for anxiety., Disp: 60 tablet, Rfl: 3 .  aspirin 81 MG tablet, Take 81 mg by mouth daily., Disp: , Rfl:  .  atorvastatin (LIPITOR)  10 MG tablet, Take 1 tablet (10 mg total) by mouth daily., Disp: 90 tablet, Rfl: 1 .  blood glucose meter kit and supplies KIT, Dispense based on patient and insurance preference. Use up to four times daily as directed. DX E11.8, Disp: 1 each, Rfl: 0 .  clopidogrel (PLAVIX) 75 MG tablet, TAKE 1 TABLET(75 MG) BY MOUTH DAILY, Disp: 90 tablet, Rfl: 0 .  diclofenac (VOLTAREN) 75 MG EC tablet, Take 75 mg by mouth 2 (two) times daily. , Disp: , Rfl:  .  Diclofenac Sodium 2 % SOLN, Use times daily as needed., Disp: 112 g, Rfl: 0 .  furosemide (LASIX) 20 MG tablet, TAKE 1 TABLET(20 MG) BY MOUTH DAILY, Disp: 30 tablet, Rfl: 3 .  glucose blood test strip, Use as instructed, Disp: 100 each, Rfl: 12 .  INVOKANA 300 MG TABS tablet, Take 1 tablet (300 mg total) by mouth daily., Disp: 90 tablet, Rfl: 1 .  ipratropium (ATROVENT) 0.06 % nasal spray, Place 2 sprays into both nostrils 4 (four) times daily., Disp: 15 mL, Rfl: 11 .  levothyroxine (SYNTHROID) 112 MCG tablet, TAKE 1 TABLET(112 MCG) BY MOUTH DAILY, Disp: 90 tablet, Rfl: 0 .  spironolactone (ALDACTONE) 25 MG tablet, TAKE 1 TABLET(25 MG) BY MOUTH DAILY, Disp: 90 tablet, Rfl: 1 .  SYMBICORT 80-4.5 MCG/ACT inhaler, INHALE 2 PUFFS INTO THE LUNGS TWICE DAILY, Disp: 10.2 g, Rfl: 3 .  Dulaglutide (TRULICITY) 5.70 VX/7.9TJ SOPN, Inject 0.75 mg into the skin once a week., Disp: 4 pen, Rfl: 0 .  Dulaglutide (TRULICITY) 0.30 SP/2.3RA SOPN, Inject 0.75 mg into the skin once a week., Disp: 4 pen, Rfl: 0 .  ondansetron (ZOFRAN ODT) 4 MG disintegrating tablet, Take 1 tablet (4 mg total) by mouth every 8 (eight) hours as needed for nausea or vomiting., Disp: 20 tablet, Rfl: 0   Allergies  Allergen Reactions  . Clindamycin/Lincomycin Other (See Comments)    Head ache   . Codeine Hives  . Dilaudid [Hydromorphone Hcl] Nausea And Vomiting  . Iodides   . Reglan [Metoclopramide] Other (See Comments)    Other reaction(s): Dystonia Hallucinations/Violent  . Tramadol   .  Tape Rash    Blisters    Review of Systems   Pertinent items are noted in the HPI. Otherwise, a complete ROS is negative.  Vitals   Vitals:   06/01/19 1321  BP: 126/64  Pulse: 96  Temp: 98.6 F (37 C)  TempSrc: Oral  SpO2: 96%  Weight: 275 lb 3.2 oz (124.8 kg)  Height: '5\' 5"'$  (1.651 m)     Body mass index is 45.8 kg/m.  Physical Exam   Physical Exam Vitals signs and nursing note reviewed.  Constitutional:      General: She is not in acute distress.    Appearance: She is obese.  HENT:     Head: Normocephalic and atraumatic.  Right Ear: External ear normal.     Left Ear: External ear normal.     Nose: Nose normal.     Mouth/Throat:     Mouth: Mucous membranes are moist.  Eyes:     Conjunctiva/sclera: Conjunctivae normal.     Pupils: Pupils are equal, round, and reactive to light.  Neck:     Musculoskeletal: Normal range of motion and neck supple.  Cardiovascular:     Rate and Rhythm: Normal rate and regular rhythm.     Heart sounds: Normal heart sounds.  Pulmonary:     Effort: Pulmonary effort is normal. No accessory muscle usage.     Breath sounds: Decreased air movement present. No wheezing, rhonchi or rales.  Abdominal:     Palpations: Abdomen is soft.  Musculoskeletal:     Comments: TTP, decreased ROM left wrist. TTP, enlarged MCP right hand.  Skin:    General: Skin is warm.     Comments: Skin color, red, especially face. Rosacea nose.   Neurological:     General: No focal deficit present.     Mental Status: She is alert.  Psychiatric:        Mood and Affect: Mood normal.        Behavior: Behavior normal.        Thought Content: Thought content normal.        Judgment: Judgment normal.    Results for orders placed or performed in visit on 05/16/19  Urine Culture   Specimen: Urine  Result Value Ref Range   MICRO NUMBER: [id number]    SPECIMEN QUALITY: Adequate    Sample Source URINE    STATUS: FINAL    ISOLATE 1:      Single organism less  than 10,000 CFU/mL isolated. These organisms, commonly found on external and internal genitalia, are considered colonizers. No further testing performed.  T4, free  Result Value Ref Range   Free T4 1.34 0.60 - 1.60 ng/dL  TSH  Result Value Ref Range   TSH 0.32 (L) 0.35 - 4.50 uIU/mL  Lipid panel  Result Value Ref Range   Cholesterol 137 0 - 200 mg/dL   Triglycerides 230.0 (H) 0.0 - 149.0 mg/dL   HDL 36.90 (L) >39.00 mg/dL   VLDL 46.0 (H) 0.0 - 40.0 mg/dL   Total CHOL/HDL Ratio 4    NonHDL 100.35   Hemoglobin A1c  Result Value Ref Range   Hgb A1c MFr Bld 7.0 (H) 4.6 - 6.5 %  Comprehensive metabolic panel  Result Value Ref Range   Sodium 141 135 - 145 mEq/L   Potassium 4.1 3.5 - 5.1 mEq/L   Chloride 102 96 - 112 mEq/L   CO2 31 19 - 32 mEq/L   Glucose, Bld 149 (H) 70 - 99 mg/dL   BUN 22 6 - 23 mg/dL   Creatinine, Ser 1.38 (H) 0.40 - 1.20 mg/dL   Total Bilirubin 0.8 0.2 - 1.2 mg/dL   Alkaline Phosphatase 85 39 - 117 U/L   AST 16 0 - 37 U/L   ALT 18 0 - 35 U/L   Total Protein 6.6 6.0 - 8.3 g/dL   Albumin 4.2 3.5 - 5.2 g/dL   Calcium 9.6 8.4 - 10.5 mg/dL   GFR 38.26 (L) >60.00 mL/min  CBC with Differential/Platelet  Result Value Ref Range   WBC 7.2 4.0 - 10.5 K/uL   RBC 5.19 (H) 3.87 - 5.11 Mil/uL   Hemoglobin 16.6 (H) 12.0 - 15.0 g/dL   HCT 50.5 (  H) 36.0 - 46.0 %   MCV 97.3 78.0 - 100.0 fl   MCHC 32.8 30.0 - 36.0 g/dL   RDW 14.7 11.5 - 15.5 %   Platelets 189.0 150.0 - 400.0 K/uL   Neutrophils Relative % 63.0 43.0 - 77.0 %   Lymphocytes Relative 26.6 12.0 - 46.0 %   Monocytes Relative 7.1 3.0 - 12.0 %   Eosinophils Relative 2.4 0.0 - 5.0 %   Basophils Relative 0.9 0.0 - 3.0 %   Neutro Abs 4.6 1.4 - 7.7 K/uL   Lymphs Abs 1.9 0.7 - 4.0 K/uL   Monocytes Absolute 0.5 0.1 - 1.0 K/uL   Eosinophils Absolute 0.2 0.0 - 0.7 K/uL   Basophils Absolute 0.1 0.0 - 0.1 K/uL  Urinalysis, Routine w reflex microscopic  Result Value Ref Range   Color, Urine YELLOW Yellow;Lt.  Yellow;Straw;Dark Yellow;Amber;Green;Red;Brown   APPearance CLEAR Clear;Turbid;Slightly Cloudy;Cloudy   Specific Gravity, Urine 1.015 1.000 - 1.030   pH 6.0 5.0 - 8.0   Total Protein, Urine NEGATIVE Negative   Urine Glucose >=1000 (A) Negative   Ketones, ur NEGATIVE Negative   Bilirubin Urine NEGATIVE Negative   Hgb urine dipstick NEGATIVE Negative   Urobilinogen, UA 0.2 0.0 - 1.0   Leukocytes,Ua NEGATIVE Negative   Nitrite NEGATIVE Negative   WBC, UA 0-2/hpf 0-2/hpf   RBC / HPF none seen 0-2/hpf   Squamous Epithelial / LPF Few(5-10/hpf) (A) Rare(0-4/hpf)   Bacteria, UA Rare(<10/hpf) (A) None  Microalbumin / creatinine urine ratio  Result Value Ref Range   Microalb, Ur <0.7 0.0 - 1.9 mg/dL   Creatinine,U 118.5 mg/dL   Microalb Creat Ratio 0.6 0.0 - 30.0 mg/g  LDL cholesterol, direct  Result Value Ref Range   Direct LDL 60.0 mg/dL   Assessment and Plan   Caleb was seen today for follow-up.  Diagnoses and all orders for this visit:  Type 2 diabetes mellitus with other specified complication, without long-term current use of insulin ([place]) Comments: Stop Metformin due to elevated creatinine. Will start Trulicity. Will likely DC SGL2. Orders: -     INVOKANA 300 MG TABS tablet; Take 1 tablet (300 mg total) by mouth daily. -     Dulaglutide (TRULICITY) 4.82 LM/7.8ML SOPN; Inject 0.75 mg into the skin once a week. -     ondansetron (ZOFRAN ODT) 4 MG disintegrating tablet; Take 1 tablet (4 mg total) by mouth every 8 (eight) hours as needed for nausea or vomiting. -     Dulaglutide (TRULICITY) 5.44 BE/0.1EO SOPN; Inject 0.75 mg into the skin once a week.  Need for immunization against influenza -     Flu Vaccine QUAD High Dose(Fluad)  Diabetic peripheral neuropathy associated with type 2 diabetes mellitus ([place]), on Gabapentin  Anxiety, uses prn Xanax  Mild obstructive sleep apnea-hypopnea syndrome but with sig desats, not on CPAP  Hypothyroidism (acquired), stable on  Levothyroxine Comments: TSH slightly low. Will recheck at next visit. Patient feels euthyroid.  Morbid (severe) obesity due to excess calories (HCC)  Cigarette nicotine dependence without complication  Polycythemia, secondary, followed by Heme, therapeutic phlebotomy Comments: Worsening. Advised to call Heme.   . Orders and follow up as documented in [place], reviewed diet, exercise and weight control, cardiovascular risk and specific lipid/LDL goals reviewed, reviewed medications and side effects in detail.  . Reviewed expectations re: course of current medical issues. . Outlined signs and symptoms indicating need for more acute intervention. . Patient verbalized understanding and all questions were answered. . Patient received an  After Visit Summary.  Briscoe Deutscher, DO [place], [facility] 06/04/2019

## 2019-06-04 ENCOUNTER — Encounter: Payer: Self-pay | Admitting: Family Medicine

## 2019-06-06 ENCOUNTER — Other Ambulatory Visit: Payer: Self-pay | Admitting: Family Medicine

## 2019-06-06 DIAGNOSIS — Z1231 Encounter for screening mammogram for malignant neoplasm of breast: Secondary | ICD-10-CM

## 2019-06-17 ENCOUNTER — Telehealth: Payer: Self-pay | Admitting: Physical Therapy

## 2019-06-17 NOTE — Telephone Encounter (Signed)
Copied from [place] [id number]. Topic: General - Other >> Jun 17, 2019  2:26 PM Pauline Good wrote: Reason for CRM: pt has started Trulicity 2wks ago and she has some questions b/c she has diarrhea and very nausea w/stomach pain. Please call pt

## 2019-06-20 NOTE — Telephone Encounter (Signed)
Patient called back she states the first time she took the Trulicity she had little nasua and cramping but not bad. Last week she had a lot of cramping and loose stools for about 4 days. She is afraid that she may have had a GI bug last week. She will try meds this week and let us know if any symptoms in the next few days.

## 2019-06-20 NOTE — Telephone Encounter (Signed)
Left message on voicemail to call office.  

## 2019-07-11 ENCOUNTER — Other Ambulatory Visit: Payer: Self-pay | Admitting: Family Medicine

## 2019-07-11 DIAGNOSIS — E1169 Type 2 diabetes mellitus with other specified complication: Secondary | ICD-10-CM

## 2019-07-11 MED ORDER — TRULICITY 0.75 MG/0.5ML ~~LOC~~ SOAJ: 0.7500 mg | SUBCUTANEOUS | 0 refills | Status: DC

## 2019-07-11 NOTE — Telephone Encounter (Signed)
Dulaglutide (TRULICITY) [id number] SOPN     Patient requesting refill of this medication.    Pharmacy:  [facility] [place], [place] [phone] (Phone) [phone] (Fax)

## 2019-07-18 ENCOUNTER — Encounter: Payer: Self-pay | Admitting: Family Medicine

## 2019-07-18 ENCOUNTER — Ambulatory Visit: Payer: Medicare HMO | Admitting: Family Medicine

## 2019-07-20 ENCOUNTER — Encounter: Payer: Self-pay | Admitting: Family Medicine

## 2019-07-20 ENCOUNTER — Other Ambulatory Visit: Payer: Self-pay | Admitting: Family Medicine

## 2019-07-20 DIAGNOSIS — R600 Localized edema: Secondary | ICD-10-CM

## 2019-07-20 MED ORDER — FUROSEMIDE 20 MG PO TABS: ORAL_TABLET | ORAL | 1 refills | Status: DC

## 2019-07-20 MED ORDER — IPRATROPIUM-ALBUTEROL 0.5-2.5 (3) MG/3ML IN SOLN: 3.0000 mL | Freq: Four times a day (QID) | RESPIRATORY_TRACT | 0 refills | Status: DC | PRN

## 2019-07-22 ENCOUNTER — Ambulatory Visit: Payer: Medicare HMO

## 2019-07-26 ENCOUNTER — Other Ambulatory Visit: Payer: Self-pay | Admitting: Cardiovascular Disease

## 2019-07-28 DIAGNOSIS — M19041 Primary osteoarthritis, right hand: Secondary | ICD-10-CM | POA: Diagnosis not present

## 2019-07-28 DIAGNOSIS — M25512 Pain in left shoulder: Secondary | ICD-10-CM | POA: Diagnosis not present

## 2019-08-04 ENCOUNTER — Ambulatory Visit: Payer: Medicare HMO | Admitting: Physician Assistant

## 2019-08-05 ENCOUNTER — Encounter: Payer: Medicare HMO | Admitting: Family Medicine

## 2019-08-10 ENCOUNTER — Other Ambulatory Visit: Payer: Self-pay

## 2019-08-10 ENCOUNTER — Encounter: Payer: Self-pay | Admitting: Family Medicine

## 2019-08-10 ENCOUNTER — Ambulatory Visit (INDEPENDENT_AMBULATORY_CARE_PROVIDER_SITE_OTHER): Payer: Medicare HMO | Admitting: Family Medicine

## 2019-08-10 VITALS — BP 125/68 | HR 73 | Temp 97.5°F | Ht 65.0 in | Wt 267.2 lb

## 2019-08-10 DIAGNOSIS — Z6841 Body Mass Index (BMI) 40.0 and over, adult: Secondary | ICD-10-CM | POA: Diagnosis not present

## 2019-08-10 DIAGNOSIS — E039 Hypothyroidism, unspecified: Secondary | ICD-10-CM | POA: Diagnosis not present

## 2019-08-10 DIAGNOSIS — I10 Essential (primary) hypertension: Secondary | ICD-10-CM | POA: Diagnosis not present

## 2019-08-10 DIAGNOSIS — E538 Deficiency of other specified B group vitamins: Secondary | ICD-10-CM | POA: Diagnosis not present

## 2019-08-10 DIAGNOSIS — Z780 Asymptomatic menopausal state: Secondary | ICD-10-CM | POA: Diagnosis not present

## 2019-08-10 DIAGNOSIS — E118 Type 2 diabetes mellitus with unspecified complications: Secondary | ICD-10-CM | POA: Diagnosis not present

## 2019-08-10 DIAGNOSIS — N179 Acute kidney failure, unspecified: Secondary | ICD-10-CM | POA: Diagnosis not present

## 2019-08-10 DIAGNOSIS — F419 Anxiety disorder, unspecified: Secondary | ICD-10-CM | POA: Diagnosis not present

## 2019-08-10 DIAGNOSIS — E1159 Type 2 diabetes mellitus with other circulatory complications: Secondary | ICD-10-CM

## 2019-08-10 DIAGNOSIS — I152 Hypertension secondary to endocrine disorders: Secondary | ICD-10-CM

## 2019-08-10 NOTE — Progress Notes (Signed)
Patient: [name] [name] MRN: [id number] DOB: Sep 04, 1951 PCP: Orma Flaming, MD     Subjective:  Chief Complaint  Patient presents with  . Transitions Of Care    HPI: The patient is a 68 y.o. female who presents today for transition of care. She has multiple issues.Marland Kitchen   1) Diabetes: Patient is here for follow up of type 2 diabetes. First diagnosed .  Currently on the following medications invokana. Takes medications as prescribed. Last A1C was 7.0. Currently not exercising and following diabetic diet. Sugars range from 90 to 120. Denies any hypoglycemic events. Denies any vision changes, nausea, vomiting, abdominal pain, ulcers/paraesthesia in feet, polyuria, polydipsia or polyphagia. Denies any chest pain, shortness of breath. She was started on trulicity, but had to stop due to side effects of medication. Metformin was stopped due to bump in creatinine. She will likely need medication adjusted.   2) Hypertension: Here for follow up of hypertension.  Currently on no medication.. Exercise includes walking. Weight has been stable. Denies any chest pain, headaches, shortness of breath, vision changes, swelling in lower extremities. She is not sure she has ever had high blood pressure. She was put on beta blocker after her heart attack.   3) hypothyroidism-due for lab recheck. Was over treated slightly and needs to have labs rechecked.   4) b12 deficiency: due for lab check up. Low in January and never got otc supplements. Asymptomatic.   5) AKI: her creatinine was bumped on last lab draw in 04/2019 and beyond. Creatine was 1.38. she has continued to take her voltaren despite elevated creatinine. She didn't know this.   6. Hyperlipidemia/CAD: on statin.   7. Anxiety: she does have anxiety that she takes xanax at night only for sleep. She states her mind starts racing and she doesn't take it nightly. Last filled on 07/26/2019. She states her husband has ptsd as well and it increases her anxiety. She  takes it at night to relax.    Review of Systems  Constitutional: Negative for chills, fatigue and fever.  HENT: Negative for dental problem, ear pain, hearing loss and trouble swallowing.   Eyes: Negative for visual disturbance.  Respiratory: Negative for cough, chest tightness and shortness of breath.   Cardiovascular: Negative for chest pain, palpitations and leg swelling.  Gastrointestinal: Negative for abdominal pain, blood in stool, diarrhea, nausea and vomiting.  Endocrine: Negative for cold intolerance, polydipsia, polyphagia and polyuria.  Genitourinary: Negative for dysuria and hematuria.  Musculoskeletal: Negative for arthralgias.  Skin: Negative for rash.  Neurological: Negative for dizziness and headaches.  Psychiatric/Behavioral: Negative for dysphoric mood and sleep disturbance. The patient is not nervous/anxious.     Allergies Patient is allergic to clindamycin/lincomycin; codeine; dilaudid [hydromorphone hcl]; iodides; reglan [metoclopramide]; tramadol; and tape.  Past Medical History Patient  has a past medical history of Bronchitis, asthmatic, Coronary artery disease, Diabetes mellitus without complication ([place]), Hyperlipidemia, Hypertensive heart disease (04/16/2016), Hypothyroidism, MI, old, Morbid obesity ([place]) (04/16/2016), S/P coronary artery stent placement (04/16/2016), and Tobacco abuse (04/16/2016).  Surgical History Patient  has a past surgical history that includes Coronary stent placement; Wrist surgery; Coronary angioplasty (02/2008); Abdominal hysterectomy; Cholecystectomy; Knee arthroscopy; Ankle surgery (Right); Rotator cuff repair (Bilateral); and Thoracic outlet surgery.  Family History Pateint's family history includes Cancer in her mother; Diabetes in her son; Hypertension in her son.  Social History Patient  reports that she has been smoking. She has a 15.50 pack-year smoking history. She has never used smokeless tobacco. She reports that  she does  not drink alcohol or use drugs.    Objective: Vitals:   08/10/19 1431  BP: 125/68  Pulse: 73  Temp: (!) 97.5 F (36.4 C)  TempSrc: Skin  SpO2: 96%  Weight: 267 lb 3.2 oz (121.2 kg)  Height: 5\' 5"  (1.651 m)    Body mass index is 44.46 kg/m.  Physical Exam Vitals signs reviewed.  Constitutional:      Appearance: She is obese.  HENT:     Head: Normocephalic and atraumatic.     Right Ear: Tympanic membrane, ear canal and external ear normal.     Left Ear: Tympanic membrane, ear canal and external ear normal.  Cardiovascular:     Rate and Rhythm: Normal rate and regular rhythm.     Heart sounds: Normal heart sounds.  Pulmonary:     Effort: Pulmonary effort is normal.     Breath sounds: Normal breath sounds.  Abdominal:     General: Bowel sounds are normal.     Palpations: Abdomen is soft.  Neurological:     General: No focal deficit present.     Mental Status: She is alert and oriented to person, place, and time.  Psychiatric:        Mood and Affect: Mood normal.        Behavior: Behavior normal.        Depression screen [facility] 2/9 08/10/2019 05/04/2019 10/18/2018 10/18/2018 09/24/2018  Decreased Interest 0 0 0 0 1  Down, Depressed, Hopeless 0 1 1 1  0  PHQ - 2 Score 0 1 1 1 1   Altered sleeping - 0 0 0 1  Tired, decreased energy - 0 0 0 1  Change in appetite - 0 1 1 1   Feeling bad or failure about yourself  - 0 0 0 1  Trouble concentrating - 0 0 0 0  Moving slowly or fidgety/restless - 0 0 0 0  Suicidal thoughts - 0 0 0 0  PHQ-9 Score - 1 2 2 5   Difficult doing work/chores - Not difficult at all Not difficult at all Not difficult at all Not difficult at all     Assessment/plan: 1. Hypertension associated with diabetes ([place]) Do not think she has had HTN as she was likely put on beta blocker for her MI. Blood pressure excellent. Continue to monitor.   2. DM (diabetes mellitus) with complications ([place]), on Metformin and Invokana Will check labs in a week and see where  she is at. Continue invokana for now. Can't tolerate trulicity, so may try her on ozempic. F/u with labs. On statin. Needs eye exam. Requested she see if she can find her pneumonia shot. Encouraged continued diet/exercise and weight loss.  - CBC with Differential/Platelet; Future - Comprehensive metabolic panel; Future - Hemoglobin A1c; Future  3. Hypothyroidism (acquired), stable on Levothyroxine  - TSH; Future - T4, free; Future  4. Anxiety, uses prn Xanax Did not like klonopin. Unsure why she is using this for sleep and discussed how this is not a sleep medication. Seemed to get a little upset that I felt this way. She is on no other medication for anxiety. Did not want anything else for sleep. pmp website checked and her 60 pill supply can last 1-2 months. Will need drug contract and to discuss sleep hygiene at next visit. ashwagandha would be a good one to try.   5. B12 deficiency  - Vitamin B12; Future  6. AKI (acute kidney injury) ([place]) Hold voltaren tablets. She is  quite upset as this helps with her OA pain. Will see what her GFR/creatinine is.  - Comprehensive metabolic panel; Future  7. Asymptomatic menopausal state  - DG Bone Density; Future    Return in about 6 days (around 08/16/2019) for labs next tuesday .   Orma Flaming, MD [place]   08/10/2019

## 2019-08-10 NOTE — Patient Instructions (Signed)
Come in next Tuesday for labs! Will go from there adjusting medication.   Otherwise, will need to see you every 3-6 months.  Nice to meet you!  Dr. Rogers Blocker

## 2019-08-12 ENCOUNTER — Other Ambulatory Visit: Payer: Self-pay | Admitting: Family Medicine

## 2019-08-12 DIAGNOSIS — Z780 Asymptomatic menopausal state: Secondary | ICD-10-CM

## 2019-08-15 ENCOUNTER — Other Ambulatory Visit: Payer: Self-pay | Admitting: Family Medicine

## 2019-08-15 DIAGNOSIS — E039 Hypothyroidism, unspecified: Secondary | ICD-10-CM

## 2019-08-16 NOTE — Telephone Encounter (Signed)
Message sent to make app

## 2019-08-21 ENCOUNTER — Other Ambulatory Visit: Payer: Self-pay | Admitting: Family Medicine

## 2019-08-21 DIAGNOSIS — E039 Hypothyroidism, unspecified: Secondary | ICD-10-CM

## 2019-08-22 ENCOUNTER — Other Ambulatory Visit: Payer: Self-pay

## 2019-08-22 ENCOUNTER — Other Ambulatory Visit (INDEPENDENT_AMBULATORY_CARE_PROVIDER_SITE_OTHER): Payer: Medicare HMO

## 2019-08-22 ENCOUNTER — Other Ambulatory Visit: Payer: Self-pay | Admitting: Family Medicine

## 2019-08-22 ENCOUNTER — Other Ambulatory Visit: Payer: Medicare HMO

## 2019-08-22 DIAGNOSIS — E538 Deficiency of other specified B group vitamins: Secondary | ICD-10-CM | POA: Diagnosis not present

## 2019-08-22 DIAGNOSIS — E118 Type 2 diabetes mellitus with unspecified complications: Secondary | ICD-10-CM | POA: Diagnosis not present

## 2019-08-22 DIAGNOSIS — E039 Hypothyroidism, unspecified: Secondary | ICD-10-CM

## 2019-08-22 DIAGNOSIS — N179 Acute kidney failure, unspecified: Secondary | ICD-10-CM

## 2019-08-22 LAB — COMPREHENSIVE METABOLIC PANEL
ALT: 16 U/L (ref 0–35)
AST: 11 U/L (ref 0–37)
Albumin: 3.8 g/dL (ref 3.5–5.2)
Alkaline Phosphatase: 99 U/L (ref 39–117)
BUN: 27 mg/dL — ABNORMAL HIGH (ref 6–23)
CO2: 30 mEq/L (ref 19–32)
Calcium: 9.4 mg/dL (ref 8.4–10.5)
Chloride: 103 mEq/L (ref 96–112)
Creatinine, Ser: 1.33 mg/dL — ABNORMAL HIGH (ref 0.40–1.20)
GFR: 39.89 mL/min — ABNORMAL LOW (ref >60.00)
Glucose, Bld: 137 mg/dL — ABNORMAL HIGH (ref 70–99)
Potassium: 3.6 mEq/L (ref 3.5–5.1)
Sodium: 140 mEq/L (ref 135–145)
Total Bilirubin: 0.7 mg/dL (ref 0.2–1.2)
Total Protein: 6.6 g/dL (ref 6.0–8.3)

## 2019-08-22 LAB — CBC WITH DIFFERENTIAL/PLATELET
Basophils Absolute: 0 10*3/uL (ref 0.0–0.1)
Basophils Relative: 0.6 % (ref 0.0–3.0)
Eosinophils Absolute: 0.2 10*3/uL (ref 0.0–0.7)
Eosinophils Relative: 2.1 % (ref 0.0–5.0)
HCT: 50.3 % — ABNORMAL HIGH (ref 36.0–46.0)
Hemoglobin: 16.6 g/dL — ABNORMAL HIGH (ref 12.0–15.0)
Lymphocytes Relative: 32.7 % (ref 12.0–46.0)
Lymphs Abs: 2.6 10*3/uL (ref 0.7–4.0)
MCHC: 33 g/dL (ref 30.0–36.0)
MCV: 95.9 fl (ref 78.0–100.0)
Monocytes Absolute: 0.6 10*3/uL (ref 0.1–1.0)
Monocytes Relative: 7 % (ref 3.0–12.0)
Neutro Abs: 4.6 10*3/uL (ref 1.4–7.7)
Neutrophils Relative %: 57.6 % (ref 43.0–77.0)
Platelets: 175 10*3/uL (ref 150.0–400.0)
RBC: 5.24 Mil/uL — ABNORMAL HIGH (ref 3.87–5.11)
RDW: 14.2 % (ref 11.5–15.5)
WBC: 8 10*3/uL (ref 4.0–10.5)

## 2019-08-22 LAB — VITAMIN B12: Vitamin B-12: 208 pg/mL — ABNORMAL LOW (ref 211–911)

## 2019-08-22 LAB — HEMOGLOBIN A1C: Hgb A1c MFr Bld: 6.9 % — ABNORMAL HIGH (ref 4.6–6.5)

## 2019-08-22 LAB — TSH: TSH: 0.79 u[IU]/mL (ref 0.35–4.50)

## 2019-08-22 LAB — T4, FREE: Free T4: 1.2 ng/dL (ref 0.60–1.60)

## 2019-08-22 NOTE — Telephone Encounter (Signed)
levothyroxine (SYNTHROID) 112 MCG tablet  Pt did establish care 11/18 with Dr Rogers Blocker, 11/18, frustrated med is out as Rogers Blocker stated no problem, pharmacy states denied.  [facility] [place], [place] [phone] (Phone) [phone] (Fax)

## 2019-08-23 ENCOUNTER — Other Ambulatory Visit: Payer: Self-pay

## 2019-08-23 DIAGNOSIS — E039 Hypothyroidism, unspecified: Secondary | ICD-10-CM

## 2019-08-23 MED ORDER — LEVOTHYROXINE SODIUM 112 MCG PO TABS: ORAL_TABLET | ORAL | 0 refills | Status: DC

## 2019-08-23 NOTE — Telephone Encounter (Signed)
°  LAST APPOINTMENT DATE: 08/10/2019  NEXT APPOINTMENT DATE:@Visit  date not found  MEDICATION: LEVOTHYROXINE  PHARMACY: [place], [place] [phone] (Phone) [phone] (Fax)     **Let patient know to contact pharmacy at the end of the day to make sure medication is ready. **  ** Please notify patient to allow 48-72 hours to process**  **Encourage patient to contact the pharmacy for refills or they can request refills through [facility]**  CLINICAL FILLS OUT ALL BELOW:   LAST REFILL:  QTY:  REFILL DATE:    OTHER COMMENTS:    Okay for refill?  Please advise

## 2019-08-23 NOTE — Telephone Encounter (Signed)
Requested medication (s) are due for refill today: yes  Requested medication (s) are on the active medication list: yes  Last refill:  05/10/2019  Future visit scheduled: no  Notes to clinic: patient had labs yesterday   Requested Prescriptions  Pending Prescriptions Disp Refills   levothyroxine (SYNTHROID) 112 MCG tablet 90 tablet 0     Endocrinology:  Hypothyroid Agents Failed - 08/22/2019  3:18 PM      Failed - TSH needs to be rechecked within 3 months after an abnormal result. Refill until TSH is due.      Passed - TSH in normal range and within 360 days    TSH  Date Value Ref Range Status  08/22/2019 0.79 0.35 - 4.50 uIU/mL Final         Passed - Valid encounter within last 12 months    Recent Outpatient Visits          1 week ago Hypertension associated with diabetes ([place])   [facility] Arlie Solomons, Ebony Hail, MD   2 months ago Type 2 diabetes mellitus with other specified complication, without long-term current use of insulin ([place])   [place] Wallace, [place], DO   3 months ago DM (diabetes mellitus) with complications ([place]), on Metformin and [place] Wallace, [place], DO   5 months ago Left wrist pain   [place] Parker, Algis Greenhouse, MD   5 months ago Diabetic peripheral neuropathy associated with type 2 diabetes mellitus ([place]), on Gabapentin   [place] Wallace, [place], [place]

## 2019-08-23 NOTE — Telephone Encounter (Signed)
See note

## 2019-08-24 ENCOUNTER — Other Ambulatory Visit: Payer: Self-pay | Admitting: Family Medicine

## 2019-08-24 MED ORDER — LINAGLIPTIN 5 MG PO TABS: 5.0000 mg | ORAL_TABLET | Freq: Every day | ORAL | 1 refills | Status: DC

## 2019-08-26 ENCOUNTER — Other Ambulatory Visit: Payer: Self-pay | Admitting: Cardiovascular Disease

## 2019-08-26 MED ORDER — ISOSORBIDE MONONITRATE ER 60 MG PO TB24: 60.0000 mg | ORAL_TABLET | Freq: Every day | ORAL | 6 refills | Status: DC

## 2019-08-29 ENCOUNTER — Other Ambulatory Visit: Payer: Self-pay

## 2019-08-29 MED ORDER — ISOSORBIDE MONONITRATE ER 60 MG PO TB24: 60.0000 mg | ORAL_TABLET | Freq: Every day | ORAL | 6 refills | Status: DC

## 2019-08-31 ENCOUNTER — Other Ambulatory Visit: Payer: Medicare HMO

## 2019-09-12 ENCOUNTER — Ambulatory Visit: Payer: Medicare HMO

## 2019-09-20 ENCOUNTER — Telehealth: Payer: Self-pay | Admitting: Family Medicine

## 2019-09-20 NOTE — Telephone Encounter (Signed)
diclofenac (VOLTAREN) 75 MG EC tablet    Patient would like to know if there is an alternative medication to Voltaren.

## 2019-09-20 NOTE — Telephone Encounter (Signed)
See note

## 2019-09-20 NOTE — Telephone Encounter (Signed)
Spoke to patient.  She takes diclofenac for arthritic pain.  She states that this helps greatly with the pain; she has still been taking this and states that she is almost out of her medication.  Per Dr. Shelby Mattocks message last result note, she had wanted her to hold the diclofenac until after her lab results came back.  Based on most recent lab result note, kidney function/GFR was greatly decreased.  Advised that she should only be taking plain Tylenol; patient states that Tylenol does nothing for her and she can still barely move with taking Tylenol.  She did report that she previously had taken Celebrex but this was also d/c.  She has nabumetone on hand (prescribed by another physician) and is wanted to know if she can just double up taking this.  I advised that this medication is also an NSAID and she should be avoiding this also.  Advised that Dr. Rogers Blocker is out of the office today but I would route this message to her to see if there was something else that she could suggest besides Tylenol or possibly prescribe something that could help her that is not in the NSAID drug class.  Patient verbalized understanding.  Please advise

## 2019-09-21 NOTE — Telephone Encounter (Signed)
Spoke with patient and reviewed all notes per Dr. Rogers Blocker.  Patient verbalizes understanding and will try getting some OTC Voltaren gel.  She did state that she does have some samples of Pennsaid which is very similar to the diclofenac gell.  Advised patient that she could try this and see if it gives her the same relief that the tablets did.

## 2019-09-21 NOTE — Telephone Encounter (Signed)
Yes, please let her know that all NSAIDs will or can cause further damage to the kidneys that can be irreversible. Her GFR is down to 39 and her creatinine was bumped up to 1.3. Until, or if, this returns to normal I do not want her to take oral NSAIDs. She could try over the counter voltaren gel to put on her achy joints. It's a topical nsaids that would be safe. If the tylenol is not working we may need to send to pain management vs. Sports med referral.  Im so sorry, I know this is frustrating.   Dr. Rogers Blocker

## 2019-10-17 ENCOUNTER — Other Ambulatory Visit: Payer: Self-pay | Admitting: Adult Health

## 2019-10-17 ENCOUNTER — Other Ambulatory Visit: Payer: Self-pay

## 2019-10-17 ENCOUNTER — Telehealth: Payer: Self-pay | Admitting: Family Medicine

## 2019-10-17 DIAGNOSIS — R600 Localized edema: Secondary | ICD-10-CM

## 2019-10-17 MED ORDER — SPIRONOLACTONE 25 MG PO TABS: ORAL_TABLET | ORAL | 1 refills | Status: DC

## 2019-10-17 NOTE — Telephone Encounter (Signed)
Refill sent in

## 2019-10-17 NOTE — Telephone Encounter (Signed)
MEDICATION:spironolactone (ALDACTONE) 25 MG tablet  PHARMACY:[facility] [id number] - [place], [place] [address]  Comments: Patient only has enough medication to last until Thursday   **Let patient know to contact pharmacy at the end of the day to make sure medication is ready. **  ** Please notify patient to allow 48-72 hours to process**  **Encourage patient to contact the pharmacy for refills or they can request refills through [facility]**

## 2019-10-20 ENCOUNTER — Telehealth: Payer: Self-pay | Admitting: Family Medicine

## 2019-10-20 ENCOUNTER — Other Ambulatory Visit: Payer: Self-pay

## 2019-10-20 MED ORDER — BUDESONIDE-FORMOTEROL FUMARATE 80-4.5 MCG/ACT IN AERO: 2.0000 | INHALATION_SPRAY | Freq: Two times a day (BID) | RESPIRATORY_TRACT | 3 refills | Status: DC

## 2019-10-20 NOTE — Telephone Encounter (Signed)
.  MEDICATION:SYMBICORT 80-4.5 MCG/ACT inhaler/albuterol (PROAIR HFA) 108 (90 Base) MCG/ACT inhaler/ipratropium-albuterol (DUONEB) 0.5-2.5 (3) MG/3ML SOLN  PHARMACY:[facility] [id number] - [place], [place] - [address]  Comments:   **Let patient know to contact pharmacy at the end of the day to make sure medication is ready. **  ** Please notify patient to allow 48-72 hours to process**  **Encourage patient to contact the pharmacy for refills or they can request refills through [facility]**

## 2019-10-20 NOTE — Progress Notes (Signed)
Erroneous

## 2019-10-20 NOTE — Telephone Encounter (Signed)
Refill sent in for Symbicort to [facility] on [address]

## 2019-10-21 ENCOUNTER — Other Ambulatory Visit: Payer: Self-pay

## 2019-10-21 DIAGNOSIS — J4541 Moderate persistent asthma with (acute) exacerbation: Secondary | ICD-10-CM

## 2019-10-21 MED ORDER — IPRATROPIUM BROMIDE 0.06 % NA SOLN: 2.0000 | Freq: Four times a day (QID) | NASAL | 11 refills | Status: DC

## 2019-10-21 MED ORDER — ALBUTEROL SULFATE HFA 108 (90 BASE) MCG/ACT IN AERS: 2.0000 | INHALATION_SPRAY | Freq: Four times a day (QID) | RESPIRATORY_TRACT | 3 refills | Status: DC | PRN

## 2019-10-21 NOTE — Progress Notes (Signed)
Sent to Pharmacy. 

## 2019-11-09 ENCOUNTER — Other Ambulatory Visit: Payer: Self-pay | Admitting: Family Medicine

## 2019-11-09 DIAGNOSIS — E2839 Other primary ovarian failure: Secondary | ICD-10-CM

## 2019-11-09 DIAGNOSIS — Z13820 Encounter for screening for osteoporosis: Secondary | ICD-10-CM

## 2019-11-11 ENCOUNTER — Other Ambulatory Visit: Payer: Medicare HMO

## 2019-11-11 ENCOUNTER — Ambulatory Visit: Payer: Medicare HMO

## 2019-11-15 ENCOUNTER — Other Ambulatory Visit: Payer: Self-pay

## 2019-11-15 DIAGNOSIS — E039 Hypothyroidism, unspecified: Secondary | ICD-10-CM

## 2019-11-15 MED ORDER — LEVOTHYROXINE SODIUM 112 MCG PO TABS: ORAL_TABLET | ORAL | 0 refills | Status: DC

## 2019-11-17 ENCOUNTER — Telehealth: Payer: Self-pay | Admitting: Family Medicine

## 2019-11-17 DIAGNOSIS — F419 Anxiety disorder, unspecified: Secondary | ICD-10-CM

## 2019-11-17 NOTE — Telephone Encounter (Signed)
Pt is requesting refills for the following medications Please advise 1. ALPRAZolam (XANAX) 0.5 MG tablet 2. Accu-Chek [employer] Lancets    [place] Please advise

## 2019-11-18 MED ORDER — ALPRAZOLAM 0.5 MG PO TABS: 0.5000 mg | ORAL_TABLET | Freq: Two times a day (BID) | ORAL | 3 refills | Status: DC | PRN

## 2019-11-18 MED ORDER — ACCU-CHEK FASTCLIX LANCETS MISC: 2 refills | Status: AC

## 2019-11-18 NOTE — Telephone Encounter (Signed)
Refilled. pmp website verified.  Orma Flaming, MD [place]

## 2019-11-21 ENCOUNTER — Encounter (INDEPENDENT_AMBULATORY_CARE_PROVIDER_SITE_OTHER): Payer: Self-pay

## 2019-11-21 ENCOUNTER — Ambulatory Visit (INDEPENDENT_AMBULATORY_CARE_PROVIDER_SITE_OTHER): Payer: Medicare HMO

## 2019-11-21 ENCOUNTER — Other Ambulatory Visit: Payer: Self-pay

## 2019-11-21 DIAGNOSIS — Z0000 Encounter for general adult medical examination without abnormal findings: Secondary | ICD-10-CM

## 2019-11-21 NOTE — Progress Notes (Signed)
This visit is being conducted via phone call due to the COVID-19 pandemic. This patient has given me verbal consent via phone to conduct this visit, patient states they are participating from their home address. Some vital signs may be absent or patient reported.   Patient identification: identified by name, DOB, and current address.  Location provider: [facility], Office Persons participating in the virtual visit: Denman George LPN, patient, and Dr. Orma Flaming     Subjective:   [name] [name] is a 69 y.o. female who presents for Medicare Annual (Subsequent) preventive examination.  Review of Systems:   Cardiac Risk Factors include: advanced age (>43mn, >>9women);diabetes mellitus;dyslipidemia;hypertension;sedentary lifestyle   Objective:     Vitals: LMP  (LMP Unknown)   There is no height or weight on file to calculate BMI.  Advanced Directives 11/21/2019 02/15/2019 02/15/2019 11/25/2017 02/29/2016  Does Patient Have a Medical Advance Directive? No No No No No  Would patient like information on creating a medical advance directive? Yes (MAU/Ambulatory/Procedural Areas - Information given) No - Patient declined - No - Patient declined -    Tobacco Social History   Tobacco Use  Smoking Status Light Tobacco Smoker  . Packs/day: 0.50  . Years: 31.00  . Pack years: 15.50  Smokeless Tobacco Never Used     Ready to quit: Not Answered Counseling given: Not Answered   Clinical Intake:  Pre-visit preparation completed: Yes  Diabetes: Yes CBG done?: No(reports fasting glucose to be in the 80s today) Did pt. bring in CBG monitor from home?: No  How often do you need to have someone help you when you read instructions, pamphlets, or other written materials from your doctor or pharmacy?: 1 - Never  Interpreter Needed?: No  Information entered by :: CDenman GeorgeLPN  Past Medical History:  Diagnosis Date  . Bronchitis, asthmatic   . Coronary artery disease    BMS to LAD  02/2008  . Diabetes mellitus without complication ([place]   . Hyperlipidemia   . Hypertensive heart disease 04/16/2016  . Hypothyroidism   . MI, old   . Morbid obesity ([place] 04/16/2016  . S/P coronary artery stent placement 04/16/2016   BMS to LAD 2009  . Tobacco abuse 04/16/2016   Past Surgical History:  Procedure Laterality Date  . ABDOMINAL HYSTERECTOMY    . ANKLE SURGERY Right   . CHOLECYSTECTOMY    . CORONARY ANGIOPLASTY  02/2008   bare metal stent to LAD   . CORONARY STENT PLACEMENT    . KNEE ARTHROSCOPY    . ROTATOR CUFF REPAIR Bilateral   . THORACIC OUTLET SURGERY    . WRIST SURGERY     Family History  Problem Relation Age of Onset  . Cancer Mother   . Diabetes Son   . Hypertension Son    Social History   Socioeconomic History  . Marital status: Married    Spouse name: Not on file  . Number of children: Not on file  . Years of education: Not on file  . Highest education level: Not on file  Occupational History  . Occupation: Disabled   Tobacco Use  . Smoking status: Light Tobacco Smoker    Packs/day: 0.50    Years: 31.00    Pack years: 15.50  . Smokeless tobacco: Never Used  Substance and Sexual Activity  . Alcohol use: No  . Drug use: No  . Sexual activity: Yes    Partners: Male  Other Topics Concern  . Not  on file  Social History Narrative   [place]    Social Determinants of Health   Financial Resource Strain:   . Difficulty of Paying Living Expenses: Not on file  Food Insecurity:   . Worried About Charity fundraiser in the Last Year: Not on file  . Ran Out of Food in the Last Year: Not on file  Transportation Needs:   . Lack of Transportation (Medical): Not on file  . Lack of Transportation (Non-Medical): Not on file  Physical Activity:   . Days of Exercise per Week: Not on file  . Minutes of Exercise per Session: Not on file  Stress:   . Feeling of Stress : Not on file  Social Connections:   . Frequency  of Communication with Friends and Family: Not on file  . Frequency of Social Gatherings with Friends and Family: Not on file  . Attends Religious Services: Not on file  . Active Member of Clubs or Organizations: Not on file  . Attends Archivist Meetings: Not on file  . Marital Status: Not on file    Outpatient Encounter Medications as of 11/21/2019  Medication Sig  . Accu-Chek [employer] Lancets MISC USE TO TEST UP TO FOUR TIMES DAILY AS DIRECTED  . albuterol (ACCUNEB) 1.25 MG/3ML nebulizer solution Take 3 mLs (1.25 mg total) by nebulization 3 (three) times daily as needed for wheezing.  Marland Kitchen albuterol (PROAIR HFA) 108 (90 Base) MCG/ACT inhaler Inhale 2 puffs into the lungs every 6 (six) hours as needed for wheezing or shortness of breath.  . ALPRAZolam (XANAX) 0.5 MG tablet Take 1 tablet (0.5 mg total) by mouth 2 (two) times daily as needed for anxiety.  Marland Kitchen aspirin 81 MG tablet Take 81 mg by mouth daily.  Marland Kitchen atorvastatin (LIPITOR) 10 MG tablet TAKE 1 TABLET(10 MG) BY MOUTH DAILY  . blood glucose meter kit and supplies KIT Dispense based on patient and insurance preference. Use up to four times daily as directed. DX E11.8  . budesonide-formoterol (SYMBICORT) 80-4.5 MCG/ACT inhaler Inhale 2 puffs into the lungs 2 (two) times daily.  . clopidogrel (PLAVIX) 75 MG tablet Take 1 tablet (75 mg total) by mouth daily. Please make annual appt with Dr. Oval Linsey for future refills. Thank you  . diclofenac (VOLTAREN) 75 MG EC tablet Take 75 mg by mouth 2 (two) times daily.   . Diclofenac Sodium 2 % SOLN Use times daily as needed.  . furosemide (LASIX) 20 MG tablet TAKE 1 TABLET(20 MG) BY MOUTH DAILY  . glucose blood test strip Use as instructed  . INVOKANA 300 MG TABS tablet Take 1 tablet (300 mg total) by mouth daily.  Marland Kitchen ipratropium (ATROVENT) 0.06 % nasal spray Place 2 sprays into both nostrils 4 (four) times daily.  Marland Kitchen ipratropium-albuterol (DUONEB) 0.5-2.5 (3) MG/3ML SOLN Take 3 mLs by nebulization  every 6 (six) hours as needed.  . isosorbide mononitrate (IMDUR) 60 MG 24 hr tablet Take 1 tablet (60 mg total) by mouth daily.  Marland Kitchen levothyroxine (SYNTHROID) 112 MCG tablet Take 1 tablet by mouth (112 mcg) daily  . linagliptin (TRADJENTA) 5 MG TABS tablet Take 1 tablet (5 mg total) by mouth daily.  Marland Kitchen spironolactone (ALDACTONE) 25 MG tablet One tab daily  . vitamin B-12 (CYANOCOBALAMIN) 1000 MCG tablet Take 1,000 mcg by mouth daily.   No facility-administered encounter medications on file as of 11/21/2019.    Activities of Daily Living In your present state  of health, do you have any difficulty performing the following activities: 11/21/2019 02/15/2019  Hearing? N N  Vision? N N  Difficulty concentrating or making decisions? N N  Walking or climbing stairs? Y N  Dressing or bathing? N N  Doing errands, shopping? Y N  Preparing Food and eating ? N -  Using the Toilet? N -  In the past six months, have you accidently leaked urine? N -  Do you have problems with loss of bowel control? N -  Managing your Medications? N -  Managing your Finances? N -  Housekeeping or managing your Housekeeping? N -  Some recent data might be hidden    Patient Care Team: Orma Flaming, MD as PCP - General (Family Medicine) Skeet Latch, MD as PCP - Cardiology (Cardiology) Lebron Conners Marinell Blight, MD (Inactive) as Consulting Physician (Hematology and Oncology) Almedia Balls, MD as Consulting Physician (Orthopedic Surgery) Verner Chol, MD as Consulting Physician (Sports Medicine)    Assessment:   This is a routine wellness examination for [name] [name].  Exercise Activities and Dietary recommendations Current Exercise Habits: The patient does not participate in regular exercise at present  Goals   None     Fall Risk Fall Risk  11/21/2019 01/24/2019 10/18/2018 03/15/2018 11/08/2016  Falls in the past year? 0 0 1 - No  Number falls in past yr: 0 0 0 - -  Injury with Fall? 0 0 0 Yes -  Comment - - patient  reported hitting her head on garage door - -  Risk for fall due to : Impaired mobility - - - -  Follow up Falls evaluation completed;Education provided;Falls prevention discussed - - - -   Is the patient's home free of loose throw rugs in walkways, pet beds, electrical cords, etc?   yes      Grab bars in the bathroom? yes      Handrails on the stairs?   yes      Adequate lighting?   yes  Depression Screen PHQ 2/9 Scores 11/21/2019 08/10/2019 05/04/2019 10/18/2018  PHQ - 2 Score 0 0 1 1  PHQ- 9 Score - - 1 2     Cognitive Function     6CIT Screen 11/21/2019  What Year? 0 points  What month? 0 points  What time? 0 points  Count back from 20 0 points  Months in reverse 0 points  Repeat phrase 0 points  Total Score 0    Immunization History  Administered Date(s) Administered  . Fluad Quad(high Dose 65+) 06/01/2019  . Influenza, High Dose Seasonal PF 07/19/2018  . Influenza,inj,Quad PF,6+ Mos 07/24/2017  . Tdap 06/22/2014    Qualifies for Shingles Vaccine?Discussed and patient will check with pharmacy for coverage.  Patient education handout provided   Screening Tests Health Maintenance  Topic Date Due  . DEXA SCAN  06/16/2018  . PNA vac Low Risk Adult (1 of 2 - PCV13) 06/16/2018  . OPHTHALMOLOGY EXAM  10/02/2018  . MAMMOGRAM  12/17/2018  . HEMOGLOBIN A1C  02/19/2020  . FOOT EXAM  05/03/2020  . URINE MICROALBUMIN  05/15/2020  . TETANUS/TDAP  06/22/2024  . COLONOSCOPY  11/07/2026  . INFLUENZA VACCINE  Completed  . Hepatitis C Screening  Completed    Cancer Screenings: Lung: Low Dose CT Chest recommended if Age 71-80 years, 30 pack-year currently smoking OR have quit w/in 15years. Patient does not qualify. Breast:  Up to date on Mammogram? Yes; scheduled  Up to date of Bone Density/Dexa? Yes;  scheduled  Colorectal: colonoscopy 11/07/16    Plan:  I have personally reviewed and addressed the Medicare Annual Wellness questionnaire and have noted the following in the  patient's chart:  A. Medical and social history B. Use of alcohol, tobacco or illicit drugs  C. Current medications and supplements D. Functional ability and status E.  Nutritional status F.  Physical activity G. Advance directives H. List of other physicians I.  Hospitalizations, surgeries, and ER visits in previous 12 months J.  [place] such as hearing and vision if needed, cognitive and depression L. Referrals, records requested, and appointments- patient will self refer for diabetic eye exam   In addition, I have reviewed and discussed with patient certain preventive protocols, quality metrics, and best practice recommendations. A written personalized care plan for preventive services as well as general preventive health recommendations were provided to patient.   Signed,  Denman George, LPN  Nurse Health Advisor   Nurse Notes: no additional

## 2019-11-21 NOTE — Patient Instructions (Signed)
[name] [name] , Thank you for taking time to come for your Medicare Wellness Visit. I appreciate your ongoing commitment to your health goals. Please review the following plan we discussed and let me know if I can assist you in the future.   Screening recommendations/referrals: Colorectal Screening: up to date; last colonoscopy 11/07/16 Mammogram: scheduled for 01/20/20  Bone Density: scheduled for 01/20/20   Vision and Dental Exams: Recommended annual ophthalmology exams for early detection of glaucoma and other disorders of the eye Recommended annual dental exams for proper oral hygiene  Diabetic Exams: Diabetic Eye Exam: recommended yearly Diabetic Foot Exam: recommended yearly; up to date  Vaccinations: Influenza vaccine: completed 06/01/19 Pneumococcal vaccine: last 10/07/12; Prevnar recommended  Tdap vaccine: up to date; last 06/22/14 Shingles vaccine: Please call your insurance company to determine your out of pocket expense for the Shingrix vaccine. You may receive this vaccine at your local pharmacy. (see handout)   Advanced directives:  I have provided a copy for you to complete at home and have notarized. Once this is complete please bring a copy in to our office so we can scan it into your chart.  Goals: Recommend to drink at least 6-8 8oz glasses of water per day and consume a balanced diet rich in fresh fruits and vegetables.   Next appointment: Please schedule your Annual Wellness Visit with your Nurse Health Advisor in one year.  Preventive Care 67 Years and Older, Female Preventive care refers to lifestyle choices and visits with your health care provider that can promote health and wellness. What does preventive care include?  A yearly physical exam. This is also called an annual well check.  Dental exams once or twice a year.  Routine eye exams. Ask your health care provider how often you should have your eyes checked.  Personal lifestyle choices, including:  Daily care  of your teeth and gums.  Regular physical activity.  Eating a healthy diet.  Avoiding tobacco and drug use.  Limiting alcohol use.  Practicing safe sex.  Taking low-dose aspirin every day if recommended by your health care provider.  Taking vitamin and mineral supplements as recommended by your health care provider. What happens during an annual well check? The services and screenings done by your health care provider during your annual well check will depend on your age, overall health, lifestyle risk factors, and family history of disease. Counseling  Your health care provider may ask you questions about your:  Alcohol use.  Tobacco use.  Drug use.  Emotional well-being.  Home and relationship well-being.  Sexual activity.  Eating habits.  History of falls.  Memory and ability to understand (cognition).  Work and work Statistician.  Reproductive health. Screening  You may have the following tests or measurements:  Height, weight, and BMI.  Blood pressure.  Lipid and cholesterol levels. These may be checked every 5 years, or more frequently if you are over 61 years old.  Skin check.  Lung cancer screening. You may have this screening every year starting at age 56 if you have a 30-pack-year history of smoking and currently smoke or have quit within the past 15 years.  Fecal occult blood test (FOBT) of the stool. You may have this test every year starting at age 49.  Flexible sigmoidoscopy or colonoscopy. You may have a sigmoidoscopy every 5 years or a colonoscopy every 10 years starting at age 67.  Hepatitis C blood test.  Hepatitis B blood test.  Sexually transmitted disease (STD)  testing.  Diabetes screening. This is done by checking your blood sugar (glucose) after you have not eaten for a while (fasting). You may have this done every 1-3 years.  Bone density scan. This is done to screen for osteoporosis. You may have this done starting at age  41.  Mammogram. This may be done every 1-2 years. Talk to your health care provider about how often you should have regular mammograms. Talk with your health care provider about your test results, treatment options, and if necessary, the need for more tests. Vaccines  Your health care provider may recommend certain vaccines, such as:  Influenza vaccine. This is recommended every year.  Tetanus, diphtheria, and acellular pertussis (Tdap, Td) vaccine. You may need a Td booster every 10 years.  Zoster vaccine. You may need this after age 35.  Pneumococcal 13-valent conjugate (PCV13) vaccine. One dose is recommended after age 23.  Pneumococcal polysaccharide (PPSV23) vaccine. One dose is recommended after age 1. Talk to your health care provider about which screenings and vaccines you need and how often you need them. This information is not intended to replace advice given to you by your health care provider. Make sure you discuss any questions you have with your health care provider. Document Released: 10/05/2015 Document Revised: 05/28/2016 Document Reviewed: 07/10/2015 Elsevier Interactive Patient Education  2017 [place] Prevention in the Home Falls can cause injuries. They can happen to people of all ages. There are many things you can do to make your home safe and to help prevent falls. What can I do on the outside of my home?  Regularly fix the edges of walkways and driveways and fix any cracks.  Remove anything that might make you trip as you walk through a door, such as a raised step or threshold.  Trim any bushes or trees on the path to your home.  Use bright outdoor lighting.  Clear any walking paths of anything that might make someone trip, such as rocks or tools.  Regularly check to see if handrails are loose or broken. Make sure that both sides of any steps have handrails.  Any raised decks and porches should have guardrails on the edges.  Have any  leaves, snow, or ice cleared regularly.  Use sand or salt on walking paths during winter.  Clean up any spills in your garage right away. This includes oil or grease spills. What can I do in the bathroom?  Use night lights.  Install grab bars by the toilet and in the tub and shower. Do not use towel bars as grab bars.  Use non-skid mats or decals in the tub or shower.  If you need to sit down in the shower, use a plastic, non-slip stool.  Keep the floor dry. Clean up any water that spills on the floor as soon as it happens.  Remove soap buildup in the tub or shower regularly.  Attach bath mats securely with double-sided non-slip rug tape.  Do not have throw rugs and other things on the floor that can make you trip. What can I do in the bedroom?  Use night lights.  Make sure that you have a light by your bed that is easy to reach.  Do not use any sheets or blankets that are too big for your bed. They should not hang down onto the floor.  Have a firm chair that has side arms. You can use this for support while you get dressed.  Do not  have throw rugs and other things on the floor that can make you trip. What can I do in the kitchen?  Clean up any spills right away.  Avoid walking on wet floors.  Keep items that you use a lot in easy-to-reach places.  If you need to reach something above you, use a strong step stool that has a grab bar.  Keep electrical cords out of the way.  Do not use floor polish or wax that makes floors slippery. If you must use wax, use non-skid floor wax.  Do not have throw rugs and other things on the floor that can make you trip. What can I do with my stairs?  Do not leave any items on the stairs.  Make sure that there are handrails on both sides of the stairs and use them. Fix handrails that are broken or loose. Make sure that handrails are as long as the stairways.  Check any carpeting to make sure that it is firmly attached to the stairs.  Fix any carpet that is loose or worn.  Avoid having throw rugs at the top or bottom of the stairs. If you do have throw rugs, attach them to the floor with carpet tape.  Make sure that you have a light switch at the top of the stairs and the bottom of the stairs. If you do not have them, ask someone to add them for you. What else can I do to help prevent falls?  Wear shoes that:  Do not have high heels.  Have rubber bottoms.  Are comfortable and fit you well.  Are closed at the toe. Do not wear sandals.  If you use a stepladder:  Make sure that it is fully opened. Do not climb a closed stepladder.  Make sure that both sides of the stepladder are locked into place.  Ask someone to hold it for you, if possible.  Clearly mark and make sure that you can see:  Any grab bars or handrails.  First and last steps.  Where the edge of each step is.  Use tools that help you move around (mobility aids) if they are needed. These include:  Canes.  Walkers.  Scooters.  Crutches.  Turn on the lights when you go into a dark area. Replace any light bulbs as soon as they burn out.  Set up your furniture so you have a clear path. Avoid moving your furniture around.  If any of your floors are uneven, fix them.  If there are any pets around you, be aware of where they are.  Review your medicines with your doctor. Some medicines can make you feel dizzy. This can increase your chance of falling. Ask your doctor what other things that you can do to help prevent falls. This information is not intended to replace advice given to you by your health care provider. Make sure you discuss any questions you have with your health care provider. Document Released: 07/05/2009 Document Revised: 02/14/2016 Document Reviewed: 10/13/2014 Elsevier Interactive Patient Education  2017 [employer].

## 2019-11-24 ENCOUNTER — Other Ambulatory Visit: Payer: Self-pay

## 2019-11-24 MED ORDER — GLUCOSE BLOOD VI STRP: ORAL_STRIP | 12 refills | Status: DC

## 2019-11-24 NOTE — Progress Notes (Signed)
Pt pharmacy requesting Alprazolam 0.5mg  tab   LOV: 08/10/19 NOV: 12/19/19  Approve?

## 2019-11-24 NOTE — Telephone Encounter (Signed)
Accu check test strips sent to pharmacy.

## 2019-11-24 NOTE — Telephone Encounter (Signed)
Patient is calling in and said she is needing strips for her Accu-Chek not the lancets.

## 2019-12-12 ENCOUNTER — Telehealth: Payer: Self-pay | Admitting: Family Medicine

## 2019-12-12 ENCOUNTER — Other Ambulatory Visit: Payer: Self-pay

## 2019-12-12 DIAGNOSIS — E1169 Type 2 diabetes mellitus with other specified complication: Secondary | ICD-10-CM

## 2019-12-12 MED ORDER — INVOKANA 300 MG PO TABS: 300.0000 mg | ORAL_TABLET | Freq: Every day | ORAL | 1 refills | Status: DC

## 2019-12-12 NOTE — Telephone Encounter (Signed)
MEDICATION: invokana 300 MG tablet  PHARMACY: [facility]  Comments:   **Let patient know to contact pharmacy at the end of the day to make sure medication is ready. **  ** Please notify patient to allow 48-72 hours to process**  **Encourage patient to contact the pharmacy for refills or they can request refills through [facility]**

## 2019-12-12 NOTE — Telephone Encounter (Signed)
Sent to patient's pharmacy.

## 2019-12-19 ENCOUNTER — Other Ambulatory Visit: Payer: Self-pay

## 2019-12-19 ENCOUNTER — Other Ambulatory Visit: Payer: Self-pay | Admitting: Family Medicine

## 2019-12-19 ENCOUNTER — Encounter: Payer: Self-pay | Admitting: Family Medicine

## 2019-12-19 ENCOUNTER — Ambulatory Visit (INDEPENDENT_AMBULATORY_CARE_PROVIDER_SITE_OTHER): Payer: Medicare HMO | Admitting: Family Medicine

## 2019-12-19 VITALS — BP 122/70 | HR 80 | Temp 97.4°F | Ht 65.0 in | Wt 267.2 lb

## 2019-12-19 DIAGNOSIS — E538 Deficiency of other specified B group vitamins: Secondary | ICD-10-CM | POA: Diagnosis not present

## 2019-12-19 DIAGNOSIS — R1013 Epigastric pain: Secondary | ICD-10-CM

## 2019-12-19 DIAGNOSIS — E118 Type 2 diabetes mellitus with unspecified complications: Secondary | ICD-10-CM | POA: Diagnosis not present

## 2019-12-19 DIAGNOSIS — R350 Frequency of micturition: Secondary | ICD-10-CM | POA: Diagnosis not present

## 2019-12-19 DIAGNOSIS — Z23 Encounter for immunization: Secondary | ICD-10-CM | POA: Diagnosis not present

## 2019-12-19 DIAGNOSIS — F419 Anxiety disorder, unspecified: Secondary | ICD-10-CM

## 2019-12-19 LAB — COMPREHENSIVE METABOLIC PANEL
ALT: 12 U/L (ref 0–35)
AST: 13 U/L (ref 0–37)
Albumin: 4.3 g/dL (ref 3.5–5.2)
Alkaline Phosphatase: 100 U/L (ref 39–117)
BUN: 25 mg/dL — ABNORMAL HIGH (ref 6–23)
CO2: 27 mEq/L (ref 19–32)
Calcium: 10 mg/dL (ref 8.4–10.5)
Chloride: 101 mEq/L (ref 96–112)
Creatinine, Ser: 1.37 mg/dL — ABNORMAL HIGH (ref 0.40–1.20)
GFR: 38.51 mL/min — ABNORMAL LOW (ref >60.00)
Glucose, Bld: 162 mg/dL — ABNORMAL HIGH (ref 70–99)
Potassium: 3.9 mEq/L (ref 3.5–5.1)
Sodium: 140 mEq/L (ref 135–145)
Total Bilirubin: 0.9 mg/dL (ref 0.2–1.2)
Total Protein: 7.4 g/dL (ref 6.0–8.3)

## 2019-12-19 LAB — VITAMIN B12: Vitamin B-12: >1500 pg/mL — ABNORMAL HIGH (ref 211–911)

## 2019-12-19 LAB — POCT URINALYSIS DIPSTICK
Bilirubin, UA: NEGATIVE
Blood, UA: NEGATIVE
Glucose, UA: POSITIVE — AB
Ketones, UA: NEGATIVE
Leukocytes, UA: NEGATIVE
Nitrite, UA: NEGATIVE
Protein, UA: NEGATIVE
Spec Grav, UA: 1.015 (ref 1.010–1.025)
Urobilinogen, UA: 0.2 E.U./dL
pH, UA: 6 (ref 5.0–8.0)

## 2019-12-19 LAB — HEMOGLOBIN A1C: Hgb A1c MFr Bld: 7.5 % — ABNORMAL HIGH (ref 4.6–6.5)

## 2019-12-19 MED ORDER — PANTOPRAZOLE SODIUM 40 MG PO TBEC: 40.0000 mg | DELAYED_RELEASE_TABLET | Freq: Every day | ORAL | 0 refills | Status: DC

## 2019-12-19 NOTE — Patient Instructions (Addendum)
For constipation try citrucel and miralax as well as increased veggie/fiber. Also make sure you are drinking at least 64 ounces of water/day. Make sure you are moving!   For reflux/stuck feeling. Will try diet below and one month of taking a PPI drug to help GERD. If not better in one month or worsening symptoms email me because we will need to send to GI.      Food Choices for Gastroesophageal Reflux Disease, Adult When you have gastroesophageal reflux disease (GERD), the foods you eat and your eating habits are very important. Choosing the right foods can help ease your discomfort. Think about working with a nutrition specialist (dietitian) to help you make good choices. What are tips for following this plan?  Meals  Choose healthy foods that are low in fat, such as fruits, vegetables, whole grains, low-fat dairy products, and lean meat, fish, and poultry.  Eat small meals often instead of 3 large meals a day. Eat your meals slowly, and in a place where you are relaxed. Avoid bending over or lying down until 2-3 hours after eating.  Avoid eating meals 2-3 hours before bed.  Avoid drinking a lot of liquid with meals.  Cook foods using methods other than frying. Bake, grill, or broil food instead.  Avoid or limit: ? Chocolate. ? Peppermint or spearmint. ? Alcohol. ? Pepper. ? Black and decaffeinated coffee. ? Black and decaffeinated tea. ? Bubbly (carbonated) soft drinks. ? Caffeinated energy drinks and soft drinks.  Limit high-fat foods such as: ? Fatty meat or fried foods. ? Whole milk, cream, butter, or ice cream. ? Nuts and nut butters. ? Pastries, donuts, and sweets made with butter or shortening.  Avoid foods that cause symptoms. These foods may be different for everyone. Common foods that cause symptoms include: ? Tomatoes. ? Oranges, lemons, and limes. ? Peppers. ? Spicy food. ? Onions and garlic. ? Vinegar. Lifestyle  Maintain a healthy weight. Ask your doctor  what weight is healthy for you. If you need to lose weight, work with your doctor to do so safely.  Exercise for at least 30 minutes for 5 or more days each week, or as told by your doctor.  Wear loose-fitting clothes.  Do not smoke. If you need help quitting, ask your doctor.  Sleep with the head of your bed higher than your feet. Use a wedge under the mattress or blocks under the bed frame to raise the head of the bed. Summary  When you have gastroesophageal reflux disease (GERD), food and lifestyle choices are very important in easing your symptoms.  Eat small meals often instead of 3 large meals a day. Eat your meals slowly, and in a place where you are relaxed.  Limit high-fat foods such as fatty meat or fried foods.  Avoid bending over or lying down until 2-3 hours after eating.  Avoid peppermint and spearmint, caffeine, alcohol, and chocolate. This information is not intended to replace advice given to you by your health care provider. Make sure you discuss any questions you have with your health care provider. Document Revised: 12/30/2018 Document Reviewed: 10/14/2016 Elsevier Patient Education  [place].

## 2019-12-19 NOTE — Progress Notes (Signed)
Patient: [name] [name] MRN: [id number] DOB: Aug 15, 1951 PCP: Orma Flaming, [place]     Subjective:  Chief Complaint  Patient presents with  . Diabetes  . Anxiety  . Gastroesophageal Reflux    Starting 1 month ago; While eating spaghetti, pt says that it felt as if it was going to come back up.    HPI: The patient is a 69 y.o. female who presents today for follow up of diabetes, b12 deficiency, urinary frequency/urgency, GERD, constipation.   Diabetes: Patient is here for follow up of type 2 diabetes.  Currently on the following medications invokana and we started tradjenta at her last visit. Takes medications as prescribed. Last A1C was 6.9. Currently not exercising and following diabetic diet. Sugars range from 80 to 122. Denies any hypoglycemic events. Denies any vision changes, nausea, vomiting, abdominal pain, ulcers/paraesthesia in feet, polyuria, polydipsia or polyphagia. Denies any chest pain, shortness of breath. She was started on trulicity, but had to stop due to side effects of medication. Metformin was stopped due to bump in creatinine. Weight has been stable. Has not gained anything since November.   b12 deficiency: was low on last visit and didn't want to come into office for infections. She has been taking over the counter b12 and will check levels today. She has been more constipated since last visit.   Urinary frequency/urgency: she actually has had an accident and wet herself a few times. She feels like is having much more increased frequency. This just started on Wednesday. No dysuria, cva tenderness, foul smell. It was only on Wednesday that this happened.   GERD: started one month ago. Intermittent in nature. She is noticing it a lot with noodles, lo mein noodles. Associated with certain foods. Its not so much burning as it feels like something gets stuck and she has to drink water to get it to go down.   CKD: likely from NSAID use. Was on NSAID for years (celebrex x 15  years) .   Wants to get johnson and johnson covid shot. Is calling about this.    Review of Systems  Constitutional: Negative for chills, fatigue and fever.  HENT: Negative for dental problem, ear pain, hearing loss and trouble swallowing.   Eyes: Negative for visual disturbance.  Respiratory: Negative for cough, chest tightness and shortness of breath.   Cardiovascular: Negative for chest pain, palpitations and leg swelling.  Gastrointestinal: Positive for constipation. Negative for abdominal pain, blood in stool, diarrhea and nausea.  Endocrine: Negative for cold intolerance, polydipsia, polyphagia and polyuria.  Genitourinary: Positive for frequency and urgency. Negative for difficulty urinating, dysuria, flank pain, hematuria and pelvic pain.  Musculoskeletal: Positive for arthralgias.  Skin: Negative for rash.  Neurological: Negative for dizziness and headaches.  Psychiatric/Behavioral: Negative for dysphoric mood and sleep disturbance. The patient is not nervous/anxious.     Allergies Patient is allergic to clindamycin/lincomycin; codeine; dilaudid [hydromorphone hcl]; iodides; reglan [metoclopramide]; tramadol; and tape.  Past Medical History Patient  has a past medical history of Bronchitis, asthmatic, Coronary artery disease, Diabetes mellitus without complication ([place]), Hyperlipidemia, Hypertensive heart disease (04/16/2016), Hypothyroidism, MI, old, Morbid obesity ([place]) (04/16/2016), S/P coronary artery stent placement (04/16/2016), and Tobacco abuse (04/16/2016).  Surgical History Patient  has a past surgical history that includes Coronary stent placement; Wrist surgery; Coronary angioplasty (02/2008); Abdominal hysterectomy; Cholecystectomy; Knee arthroscopy; Ankle surgery (Right); Rotator cuff repair (Bilateral); and Thoracic outlet surgery.  Family History Pateint's family history includes Cancer in her mother; Diabetes in her son;  Hypertension in her son.  Social  History Patient  reports that she has been smoking. She has a 15.50 pack-year smoking history. She has never used smokeless tobacco. She reports that she does not drink alcohol or use drugs.    Objective: Vitals:   12/19/19 0955  BP: 122/70  Pulse: 80  Temp: (!) 97.4 F (36.3 C)  TempSrc: Temporal  SpO2: 95%  Weight: 267 lb 3.2 oz (121.2 kg)  Height: 5\' 5"  (1.651 m)    Body mass index is 44.46 kg/m.  Physical Exam Vitals reviewed.  Constitutional:      Appearance: She is obese.     Comments: Tobacco odor   HENT:     Head: Normocephalic and atraumatic.  Cardiovascular:     Rate and Rhythm: Normal rate and regular rhythm.     Heart sounds: Normal heart sounds.  Pulmonary:     Effort: Pulmonary effort is normal.     Breath sounds: Normal breath sounds.  Abdominal:     General: Bowel sounds are normal.     Palpations: Abdomen is soft.     Tenderness: There is no abdominal tenderness.  Musculoskeletal:     Cervical back: Normal range of motion and neck supple.  Skin:    General: Skin is warm.     Capillary Refill: Capillary refill takes less than 2 seconds.     Comments: Varicose veins in left lower leg. Not tender, not warm.   Neurological:     General: No focal deficit present.     Mental Status: She is alert and oriented to person, place, and time.    GAD 7 : Generalized Anxiety Score 12/19/2019  Nervous, Anxious, on Edge 0  Control/stop worrying 1  Worry too much - different things 1  Trouble relaxing 1  Restless 0  Easily annoyed or irritable 1  Afraid - awful might happen 0  Total GAD 7 Score 4  Anxiety Difficulty Somewhat difficult         Assessment/plan: 1. DM (diabetes mellitus) with complications ([place]), Was near goal on her last check. Added on tradjenta. Can not do metformin/trulicity. Pneumonia shot today. Educated she needs to wait 4 weeks before covid vaccine. continue statin. Discussed eye exam. Continue to work hard on exercise and weight  loss. Will f/u in 3-6 months depending on a1c.  - Comprehensive metabolic panel - Hemoglobin A1c  2. B12 deficiency  - Vitamin B12  3. Urinary frequency  ? If urge incontinence. Does drink a lot of diuretics and discussed this with her. Will check urine for infection and will f/u on this at next visit or sooner if needed.  - POCT urinalysis dipstick - Urine Culture  4. Anxiety, uses prn Xanax Drug contract signed today. No refills needed. pmp website verified and reviewed.   5. Need for vaccination for Strep pneumoniae discussed can not get covid shot x 3-4 weeks.  - Pneumococcal polysaccharide vaccine 23-valent greater than or equal to 2yo subcutaneous/IM  6. Dyspepsia Trial x1 month of PPI, GERD diet. Weigh loss and cessation of smoking would help as well. If not getting better will refer to GI to make sure no structural issue like stricture vs. Hiatal hernia. She will let me know. Instructed on how to take medication.   7. Constipation Recommended exercise, increased water, citrucel and could try miralax. utd on cscope. Let me know if not getting better.    This visit occurred during the SARS-CoV-2 public health emergency.  Safety protocols  were in place, including screening questions prior to the visit, additional usage of staff PPE, and extensive cleaning of exam room while observing appropriate contact time as indicated for disinfecting solutions.     Return in about 3 months (around 03/20/2020) for 3-6 months .   Orma Flaming, MD [place]   12/19/2019

## 2019-12-21 ENCOUNTER — Other Ambulatory Visit: Payer: Self-pay | Admitting: Family Medicine

## 2019-12-21 LAB — URINE CULTURE
MICRO NUMBER:: 10302679
Result:: NO GROWTH
SPECIMEN QUALITY:: ADEQUATE

## 2019-12-21 MED ORDER — RYBELSUS 3 MG PO TABS: 3.0000 mg | ORAL_TABLET | Freq: Every day | ORAL | 0 refills | Status: DC

## 2019-12-22 ENCOUNTER — Telehealth: Payer: Self-pay | Admitting: Family Medicine

## 2019-12-22 NOTE — Telephone Encounter (Signed)
Patient is returning Melitta's call about lab results.  

## 2019-12-22 NOTE — Telephone Encounter (Signed)
I accidentally deleted her result note. Please go to labs and see note to give patient result.  Sorry! Dr. Rogers Blocker

## 2019-12-22 NOTE — Telephone Encounter (Signed)
Spoke to patient to give result message. Pt says that she had a bad reaction to Trulicity. She symptoms include: diarrhea, constipation, and severe abdominal pain. She is willing to try Rybelsus, and will update [place] on any problems. She is not sure why her a1c is up, she says that her at readings are 140 and she stays away from sweets.

## 2019-12-22 NOTE — Telephone Encounter (Signed)
Spoke with pt to give lab results. Pt voiced understanding.

## 2019-12-29 ENCOUNTER — Telehealth: Payer: Self-pay

## 2019-12-29 NOTE — Telephone Encounter (Signed)
Continue all medication. Just adding on new diabetic medication since her diabetes was worse. We will have to watch her renal function since on the invokana, but do not need to stop at this time.  Orma Flaming, MD [place]

## 2019-12-29 NOTE — Telephone Encounter (Signed)
Patient is concern about taking new medication.patient she haven't pick the medicine up from pharmacy. Patient would like to know should she stop taking current  medication

## 2019-12-30 NOTE — Telephone Encounter (Signed)
Spoke with patient, she states that her pharmacist told her that all three medications combined was very uncommon. Wanted to make sure that PCP clarifies she wants patient to take Invokana, Ozempic, and Semaglutide together. I informed patient that I would route message to PCP.

## 2019-12-30 NOTE — Telephone Encounter (Signed)
No... We stopped the ozempic because she said she couldn't tolerate it. The new pill ryselbus is the same as ozempic, just in pill form. She should only be on the pill and invokana as per HER-she couldn't tolerate ozempic.  Thanks for clarifying.  Dr. Rogers Blocker

## 2019-12-30 NOTE — Telephone Encounter (Signed)
Spoke with pt to clarify medications that she is supposed to be taking. Pt will TRY Rybelsus (same as Ozempic) to see if she can tolerate it. But she will continue taking Tradjenta and Invokana. If Rybelsus does not work, she will restart insulin. Pt will call back with updates.

## 2019-12-30 NOTE — Telephone Encounter (Signed)
Lvm for pt to call the office back. 

## 2019-12-30 NOTE — Telephone Encounter (Signed)
Patient is returning Melitta's call.

## 2020-01-17 ENCOUNTER — Other Ambulatory Visit: Payer: Self-pay | Admitting: Family Medicine

## 2020-01-17 ENCOUNTER — Telehealth: Payer: Self-pay | Admitting: Family Medicine

## 2020-01-17 DIAGNOSIS — R600 Localized edema: Secondary | ICD-10-CM

## 2020-01-17 NOTE — Telephone Encounter (Signed)
MEDICATION: symbicort 80-4.5 MCG, furosemide 20 MG  PHARMACY: [facility]  Comments:   **Let patient know to contact pharmacy at the end of the day to make sure medication is ready. **  ** Please notify patient to allow 48-72 hours to process**  **Encourage patient to contact the pharmacy for refills or they can request refills through [facility]**

## 2020-01-17 NOTE — Telephone Encounter (Signed)
Medication sent in. 

## 2020-01-20 ENCOUNTER — Ambulatory Visit
Admission: RE | Admit: 2020-01-20 | Discharge: 2020-01-20 | Disposition: A | Discharge status: Home / Self Care | Payer: Medicare HMO | Source: Ambulatory Visit | Attending: Family Medicine | Admitting: Family Medicine

## 2020-01-20 ENCOUNTER — Other Ambulatory Visit: Payer: Self-pay

## 2020-01-20 DIAGNOSIS — Z13820 Encounter for screening for osteoporosis: Secondary | ICD-10-CM

## 2020-01-20 DIAGNOSIS — Z1231 Encounter for screening mammogram for malignant neoplasm of breast: Secondary | ICD-10-CM | POA: Diagnosis not present

## 2020-01-20 DIAGNOSIS — E2839 Other primary ovarian failure: Secondary | ICD-10-CM

## 2020-01-20 DIAGNOSIS — Z780 Asymptomatic menopausal state: Secondary | ICD-10-CM | POA: Diagnosis not present

## 2020-01-20 DIAGNOSIS — M85851 Other specified disorders of bone density and structure, right thigh: Secondary | ICD-10-CM | POA: Diagnosis not present

## 2020-01-25 ENCOUNTER — Encounter: Payer: Self-pay | Admitting: Family Medicine

## 2020-01-25 ENCOUNTER — Telehealth: Payer: Self-pay

## 2020-01-25 DIAGNOSIS — M8580 Other specified disorders of bone density and structure, unspecified site: Secondary | ICD-10-CM | POA: Insufficient documentation

## 2020-01-25 NOTE — Telephone Encounter (Signed)
Pt says that she has stopped Rybelsus. It was giving her severe constipation and abdominal pain. She also says that she had an incident where her blood sugar bottomed out to 67. She says that she drank a sugary drink that brought it back up to 97. She has begun to feel normal after stopping the rybelsus. Her bowel movements are normal. And her readings are now 112-120. She also says that she eats healthy, so she does not know why her A1c is so high.

## 2020-01-26 NOTE — Telephone Encounter (Signed)
I would say lets check her a1c then when it has been 3 months from last check. Stay off rybelsus.  Orma Flaming, MD [place]

## 2020-01-26 NOTE — Telephone Encounter (Signed)
Lvm for pt to call the office back. 

## 2020-01-27 NOTE — Telephone Encounter (Signed)
Pt returned call to speak with MA. Please advise.

## 2020-01-27 NOTE — Telephone Encounter (Signed)
I spoke with pt to give message below. Pt agrees, and was able to confirm when to come back in for lab work. She is also requesting a cough medicine, she states that she has coughed so much her ribs hurt. She says she has taken OTC medicines but they do not help.   Please Advise.

## 2020-01-29 NOTE — Progress Notes (Signed)
Cardiology Office Note   Date:  01/30/2020   ID:  [name] [name] 10/17/1950, MRN [id number]  PCP:  [name] Flaming, MD  Cardiologist:   [name] Latch, MD   No chief complaint on file.    History of Present Illness: [name] [name] is a 69 y.o. female with CAD s/p PCI, COPD, morbid obesity, chronic bronchitis, and hypothyroidism who presents to for follow up.  While living in [place] she had a BMS in the LAD 02/2008.  Since then she has done very well.  She had a 2 day Persantine Cardiolite on 01/07/13 that was negative for ischemia. LVEF was 79%.  She is the primary caretaker of her 62 year old grandson who has ADD.  Her daughter also lives with her.  Her husband also suffers from posttraumatic stress disorder. She has been seen by pulmonologist and reports that she does not have COPD.  She was started on Symbicort and reports that this has helped her breathing.    Since her last appointment she saw [name] Sims, DNP on 02/2019 and was doing well physically.  She lost 30lb by swimming and working on her diet.  She is struggling with pain in her shoulders.  She is getting ready to have an injection in her shoulder.  She thinks that she has a torn rotator cuff again.  She looks forward to opening her pool soon.  She is wondering if she should get the coronavirus vaccine.  She continues to cut back with smoking.  She is at a half pack daily. She has been using a vape as well.  She struggles with chronic cough.   Past Medical History:  Diagnosis Date  . Bronchitis, asthmatic   . Coronary artery disease    BMS to LAD 02/2008  . Diabetes mellitus without complication ([place])   . Hyperlipidemia   . Hypertensive heart disease 04/16/2016  . Hypothyroidism   . MI, old   . Morbid obesity ([place]) 04/16/2016  . S/P coronary artery stent placement 04/16/2016   BMS to LAD 2009  . Tobacco abuse 04/16/2016    Past Surgical History:  Procedure Laterality Date  . ABDOMINAL HYSTERECTOMY    . ANKLE SURGERY  Right   . CHOLECYSTECTOMY    . CORONARY ANGIOPLASTY  02/2008   bare metal stent to LAD   . CORONARY STENT PLACEMENT    . KNEE ARTHROSCOPY    . ROTATOR CUFF REPAIR Bilateral   . THORACIC OUTLET SURGERY    . WRIST SURGERY       Current Outpatient Medications  Medication Sig Dispense Refill  . Accu-Chek FastClix Lancets MISC USE TO TEST UP TO FOUR TIMES DAILY AS DIRECTED 306 each 2  . albuterol (ACCUNEB) 1.25 MG/3ML nebulizer solution Take 3 mLs (1.25 mg total) by nebulization 3 (three) times daily as needed for wheezing. 75 mL 2  . albuterol (PROAIR HFA) 108 (90 Base) MCG/ACT inhaler Inhale 2 puffs into the lungs every 6 (six) hours as needed for wheezing or shortness of breath. 6.7 g 3  . ALPRAZolam (XANAX) 0.5 MG tablet Take 1 tablet (0.5 mg total) by mouth 2 (two) times daily as needed for anxiety. 60 tablet 3  . aspirin 81 MG tablet Take 81 mg by mouth daily.    Marland Kitchen atorvastatin (LIPITOR) 10 MG tablet TAKE 1 TABLET(10 MG) BY MOUTH DAILY 90 tablet 1  . blood glucose meter kit and supplies KIT Dispense based on patient and insurance preference. Use up to four times  daily as directed. DX E11.8 1 each 0  . diclofenac (VOLTAREN) 75 MG EC tablet Take 75 mg by mouth 2 (two) times daily.     . Diclofenac Sodium 2 % SOLN Use times daily as needed. 112 g 0  . furosemide (LASIX) 20 MG tablet TAKE 1 TABLET(20 MG) BY MOUTH DAILY 90 tablet 1  . glucose blood test strip Use as instructed 100 each 12  . INVOKANA 300 MG TABS tablet Take 1 tablet (300 mg total) by mouth daily. 90 tablet 1  . ipratropium (ATROVENT) 0.06 % nasal spray Place 2 sprays into both nostrils 4 (four) times daily. 15 mL 11  . ipratropium-albuterol (DUONEB) 0.5-2.5 (3) MG/3ML SOLN Take 3 mLs by nebulization every 6 (six) hours as needed. 360 mL 0  . isosorbide mononitrate (IMDUR) 60 MG 24 hr tablet Take 1 tablet (60 mg total) by mouth daily. 30 tablet 6  . levothyroxine (SYNTHROID) 112 MCG tablet Take 1 tablet by mouth (112 mcg)  daily 90 tablet 0  . linagliptin (TRADJENTA) 5 MG TABS tablet Take 1 tablet (5 mg total) by mouth daily. 90 tablet 1  . pantoprazole (PROTONIX) 40 MG tablet TAKE 1 TABLET(40 MG) BY MOUTH DAILY 90 tablet 0  . Semaglutide (RYBELSUS) 3 MG TABS Take 3 mg by mouth daily. 30 tablet 0  . spironolactone (ALDACTONE) 25 MG tablet One tab daily 90 tablet 1  . SYMBICORT 80-4.5 MCG/ACT inhaler INHALE 2 PUFFS INTO THE LUNGS TWICE DAILY 10.2 g 3  . vitamin B-12 (CYANOCOBALAMIN) 1000 MCG tablet Take 1,000 mcg by mouth daily.     No current facility-administered medications for this visit.    Allergies:   Clindamycin/lincomycin, Codeine, Dilaudid [hydromorphone hcl], Iodides, Reglan [metoclopramide], Tramadol, and Tape    Social History:  The patient  reports that she has been smoking. She has a 15.50 pack-year smoking history. She has never used smokeless tobacco. She reports that she does not drink alcohol or use drugs.   Family History:  The patient's family history includes Cancer in her mother; Diabetes in her son; Hypertension in her son.    ROS:  Please see the history of present illness.   Otherwise, review of systems are positive for none.   All other systems are reviewed and negative.    PHYSICAL EXAM: VS:  BP 128/82   Pulse 68   Ht '5\' 5"'$  (1.651 m)   Wt 266 lb (120.7 kg)   LMP  (LMP Unknown)   SpO2 96%   BMI 44.26 kg/m  , BMI Body mass index is 44.26 kg/m. GENERAL:  Morbidly obese.  Well appearing HEENT: Pupils equal round and reactive, fundi not visualized, oral mucosa unremarkable NECK:  No jugular venous distention, waveform within normal limits, carotid upstroke brisk and symmetric, no bruits LUNGS:  Clear to auscultation bilaterally HEART:  RRR.  PMI not displaced or sustained,S1 and S2 within normal limits, no S3, no S4, no clicks, no rubs, no murmurs ABD:  Flat, positive bowel sounds normal in frequency in pitch, no bruits, no rebound, no guarding, no midline pulsatile mass, no  hepatomegaly, no splenomegaly EXT:  2 plus pulses throughout, no edema, no cyanosis no clubbing SKIN:  No rashes no nodules NEURO:  Cranial nerves II through XII grossly intact, motor grossly intact throughout PSYCH:  Cognitively intact, oriented to person place and time  EKG:  EKG is ordered today. The ekg ordered 10/31/16 demonstrates sinus rhythm rate 74 bpm. LPFB. 12/01/17: Sinus rhythm.  Rate 74  bpm.  Low voltage limb leads   And precordial leads 01/30/20.  Sinus rhythm.  Rate 68 bpm.  Low voltage.   Recent Labs: 02/15/2019: Magnesium 1.5 08/22/2019: Hemoglobin 16.6; Platelets 175.0; TSH 0.79 12/19/2019: ALT 12; BUN 25; Creatinine, Ser 1.37; Potassium 3.9; Sodium 140    Lipid Panel    Component Value Date/Time   CHOL 137 05/16/2019 1055   TRIG 230.0 (H) 05/16/2019 1055   HDL 36.90 (L) 05/16/2019 1055   CHOLHDL 4 05/16/2019 1055   VLDL 46.0 (H) 05/16/2019 1055   LDLCALC 28 02/15/2019 2144   LDLDIRECT 60.0 05/16/2019 1055      Wt Readings from Last 3 Encounters:  01/30/20 266 lb (120.7 kg)  12/19/19 267 lb 3.2 oz (121.2 kg)  08/10/19 267 lb 3.2 oz (121.2 kg)      ASSESSMENT AND PLAN:  # CAD s/p BMS to LAD: [name] [name] continues to do well.  Continue aspirin, atorvastatin, metoprolol, and isosorbide.  She is okay with stopping clopidogrel.  # Hypertensive heart disease: BP well-controlled on spironolactone, metoprolol and Imdur.   # Hyperlipidemia: Continue atorvastatin.  LDL was 60 on 04/2019.  Lipids are followed with her PCP.  # Morbid obesity: [name] [name] was encouraged to start back her exercise and use the pool given her knee pain.   # Tobacco abuse: Patient was encouraged to keep cutting back.  She is currently smoking half a pack daily.  # COVID-19: [name] [name] was encouraged to get the Covid vaccine.  She is quite hesitant.   Current medicines are reviewed at length with the patient today.  The patient does not have concerns regarding medicines.  The following  changes have been made:  Stop clopidogrel  Labs/ tests ordered today include:   No orders of the defined types were placed in this encounter.    Disposition:   FU with Tawfiq Favila C. Oval Linsey, MD, [facility] in 1 year   Signed, Dyann Goodspeed C. Oval Linsey, MD, [facility]  01/30/2020 12:41 PM    [place]

## 2020-01-30 ENCOUNTER — Ambulatory Visit: Payer: Medicare HMO | Admitting: Cardiovascular Disease

## 2020-01-30 ENCOUNTER — Encounter: Payer: Self-pay | Admitting: Cardiovascular Disease

## 2020-01-30 ENCOUNTER — Other Ambulatory Visit: Payer: Self-pay

## 2020-01-30 VITALS — BP 128/82 | HR 68 | Ht 65.0 in | Wt 266.0 lb

## 2020-01-30 DIAGNOSIS — J449 Chronic obstructive pulmonary disease, unspecified: Secondary | ICD-10-CM

## 2020-01-30 DIAGNOSIS — E1169 Type 2 diabetes mellitus with other specified complication: Secondary | ICD-10-CM

## 2020-01-30 DIAGNOSIS — G4733 Obstructive sleep apnea (adult) (pediatric): Secondary | ICD-10-CM

## 2020-01-30 DIAGNOSIS — E785 Hyperlipidemia, unspecified: Secondary | ICD-10-CM

## 2020-01-30 DIAGNOSIS — I2510 Atherosclerotic heart disease of native coronary artery without angina pectoris: Secondary | ICD-10-CM | POA: Diagnosis not present

## 2020-01-30 DIAGNOSIS — I119 Hypertensive heart disease without heart failure: Secondary | ICD-10-CM | POA: Diagnosis not present

## 2020-01-30 NOTE — Patient Instructions (Signed)
Medication Instructions:  STOP CLOPIDOGREL   *If you need a refill on your cardiac medications before your next appointment, please call your pharmacy*  Lab Work: NONE   Testing/Procedures: NONE  Follow-Up: At [facility], you and your health needs are our priority.  As part of our continuing mission to provide you with exceptional heart care, we have created designated Provider Care Teams.  These Care Teams include your primary Cardiologist (physician) and Advanced Practice Providers (APPs -  Physician Assistants and Nurse Practitioners) who all work together to provide you with the care you need, when you need it.  We recommend signing up for the patient portal called "MyChart".  Sign up information is provided on this After Visit Summary.  MyChart is used to connect with patients for Virtual Visits (Telemedicine).  Patients are able to view lab/test results, encounter notes, upcoming appointments, etc.  Non-urgent messages can be sent to your provider as well.   To learn more about what you can do with MyChart, go to [email or web address].    Your next appointment:   12 month(s)  The format for your next appointment:   In Person  Provider:   You may see Skeet Latch, MD or one of the following Advanced Practice Providers on your designated Care Team:    Kerin Ransom, PA-C  [place], [place]  Coletta Memos, [place]

## 2020-01-30 NOTE — Telephone Encounter (Signed)
Only px cough medication I do is the one with codeine and she is allergic to this. Could do trial of tessalon pearls if interested in those.  Orma Flaming, MD [place]

## 2020-01-31 NOTE — Telephone Encounter (Signed)
LMOVM advising patient to return call  

## 2020-01-31 NOTE — Telephone Encounter (Signed)
Pt says that she cannot take this medication with percocet. She is requesting a rx refill for Promethazine Codeine 6.25 10mg /2ml for cough.  Please Advise.

## 2020-02-01 MED ORDER — GUAIFENESIN-CODEINE 100-10 MG/5ML PO SOLN: 10.0000 mL | Freq: Four times a day (QID) | ORAL | 0 refills | Status: DC | PRN

## 2020-02-01 NOTE — Addendum Note (Signed)
Addended by: Orma Flaming on: 02/01/2020 03:20 PM   Modules accepted: Orders

## 2020-02-01 NOTE — Telephone Encounter (Signed)
Sent in the guaifenesin-codeine cough syrup. This is for cough.  Orma Flaming, MD [place]

## 2020-02-14 ENCOUNTER — Other Ambulatory Visit: Payer: Self-pay | Admitting: Family Medicine

## 2020-02-14 DIAGNOSIS — E039 Hypothyroidism, unspecified: Secondary | ICD-10-CM

## 2020-02-14 NOTE — Telephone Encounter (Signed)
°  LAST APPOINTMENT DATE:  12/19/19  NEXT APPOINTMENT DATE:@Visit  date not found  MEDICATION:levothyroxine (SYNTHROID) 112 MCG tablet/linagliptin (TRADJENTA) 5 MG TABS tablet  PHARMACY: [place], [place] AT [place] Phone:  [phone]          **Let patient know to contact pharmacy at the end of the day to make sure medication is ready. **  ** Please notify patient to allow 48-72 hours to process**  **Encourage patient to contact the pharmacy for refills or they can request refills through [facility]**  CLINICAL FILLS OUT ALL BELOW:   LAST REFILL:  QTY:  REFILL DATE:    OTHER COMMENTS:    Okay for refill?  Please advise

## 2020-02-20 ENCOUNTER — Other Ambulatory Visit: Payer: Self-pay | Admitting: Cardiology

## 2020-03-13 DIAGNOSIS — M1712 Unilateral primary osteoarthritis, left knee: Secondary | ICD-10-CM | POA: Diagnosis not present

## 2020-03-13 DIAGNOSIS — M19042 Primary osteoarthritis, left hand: Secondary | ICD-10-CM | POA: Diagnosis not present

## 2020-03-27 ENCOUNTER — Other Ambulatory Visit: Payer: Self-pay | Admitting: Adult Health

## 2020-03-27 MED ORDER — ISOSORBIDE MONONITRATE ER 60 MG PO TB24: 60.0000 mg | ORAL_TABLET | Freq: Every day | ORAL | 3 refills | Status: DC

## 2020-03-27 NOTE — Telephone Encounter (Signed)
   *  STAT* If patient is at the pharmacy, call can be transferred to refill team.   1. Which medications need to be refilled? (please list name of each medication and dose if known)   isosorbide mononitrate (IMDUR) 60 MG 24 hr tablet    2. Which pharmacy/location (including street and city if local pharmacy) is medication to be sent to? [facility] [place], [place]  3. Do they need a 30 day or 90 day supply? 90 days   Pt said she only have 2 tablet left

## 2020-04-12 ENCOUNTER — Other Ambulatory Visit: Payer: Self-pay | Admitting: Adult Health

## 2020-04-18 ENCOUNTER — Telehealth: Payer: Self-pay | Admitting: Family Medicine

## 2020-04-18 DIAGNOSIS — R600 Localized edema: Secondary | ICD-10-CM

## 2020-04-18 MED ORDER — SPIRONOLACTONE 25 MG PO TABS: ORAL_TABLET | ORAL | 1 refills | Status: DC

## 2020-04-18 NOTE — Telephone Encounter (Signed)
  LAST APPOINTMENT DATE: 02/14/2020   NEXT APPOINTMENT DATE:@8 /01/2020  MEDICATION:spironolactone (ALDACTONE) 25 MG tablet  PHARMACY:[id number] - [place],  - [address] AT [address]   Comments: patient is completely out.

## 2020-04-18 NOTE — Telephone Encounter (Signed)
Refill has been sent to the patient's pharmacy.  

## 2020-04-26 ENCOUNTER — Ambulatory Visit (INDEPENDENT_AMBULATORY_CARE_PROVIDER_SITE_OTHER): Payer: Medicare HMO | Admitting: Family Medicine

## 2020-04-26 ENCOUNTER — Encounter: Payer: Self-pay | Admitting: Family Medicine

## 2020-04-26 ENCOUNTER — Other Ambulatory Visit: Payer: Self-pay

## 2020-04-26 VITALS — BP 120/72 | HR 71 | Temp 97.6°F | Ht 65.0 in | Wt 264.0 lb

## 2020-04-26 DIAGNOSIS — D751 Secondary polycythemia: Secondary | ICD-10-CM

## 2020-04-26 DIAGNOSIS — E118 Type 2 diabetes mellitus with unspecified complications: Secondary | ICD-10-CM | POA: Diagnosis not present

## 2020-04-26 DIAGNOSIS — E039 Hypothyroidism, unspecified: Secondary | ICD-10-CM

## 2020-04-26 DIAGNOSIS — E1169 Type 2 diabetes mellitus with other specified complication: Secondary | ICD-10-CM

## 2020-04-26 DIAGNOSIS — E785 Hyperlipidemia, unspecified: Secondary | ICD-10-CM

## 2020-04-26 DIAGNOSIS — Z6841 Body Mass Index (BMI) 40.0 and over, adult: Secondary | ICD-10-CM | POA: Diagnosis not present

## 2020-04-26 MED ORDER — PANTOPRAZOLE SODIUM 40 MG PO TBEC: DELAYED_RELEASE_TABLET | ORAL | 3 refills | Status: DC

## 2020-04-26 MED ORDER — ALBUTEROL SULFATE 1.25 MG/3ML IN NEBU: 1.0000 | INHALATION_SOLUTION | Freq: Three times a day (TID) | RESPIRATORY_TRACT | 2 refills | Status: DC | PRN

## 2020-04-26 NOTE — Progress Notes (Signed)
Patient: [name] [name] MRN: [id number] DOB: Aug 20, 1951 PCP: Orma Flaming, MD     Subjective:  Chief Complaint  Patient presents with  . Hypertension  . Hypothyroidism  . Hyperlipidemia  . Diabetes    HPI: The patient is a 69 y.o. female who presents today for 6 month follow up, and routine labs.   Diabetes Patient is here for follow up of type 2 diabetes. Currently on the following medications invokana and we started tradjenta at her last visit. I also had ordered rybelsus for her.  Takes medications as prescribed. Last A1C was7.5. Currentlynotexercising and following diabetic diet. Sugars range from108to 240. Denies any hypoglycemic events. Denies any vision changes, nausea, vomiting, abdominal pain, ulcers/paraesthesia in feet, polyuria, polydipsia or polyphagia. Denies any chest pain, shortness of breath.She was started on rybelsus, but had to stop due to side effects of medication. Metformin was stopped due to bump in creatinine. Weight has been stable.   Hypothyroidism Doing well. Denies any symptoms of hyper or hypo. Takes medication as directed.   Hyperlipidemia Currently on lipitor 10mg /day. Takes as directed. No issues.   Secondary polycythemia Seen by hematology and dismissed in 2019. Following cbc regularly.    Has done her covid vaccines  Review of Systems  Constitutional: Negative for chills, fatigue and fever.  HENT: Negative for dental problem, ear pain, hearing loss and trouble swallowing.   Eyes: Negative for visual disturbance.  Respiratory: Positive for shortness of breath. Negative for cough and chest tightness.   Cardiovascular: Negative for chest pain, palpitations and leg swelling.  Gastrointestinal: Negative for abdominal pain, blood in stool, diarrhea and nausea.  Endocrine: Negative for cold intolerance, polydipsia, polyphagia and polyuria.  Genitourinary: Negative for dysuria, frequency, hematuria, pelvic pain and urgency.  Musculoskeletal:  Positive for arthralgias.  Skin: Negative for rash.  Neurological: Negative for dizziness and headaches.  Psychiatric/Behavioral: Negative for dysphoric mood and sleep disturbance. The patient is not nervous/anxious.     Allergies Patient is allergic to clindamycin/lincomycin, codeine, dilaudid [hydromorphone hcl], iodides, reglan [metoclopramide], tramadol, and tape.  Past Medical History Patient  has a past medical history of Bronchitis, asthmatic, Coronary artery disease, Diabetes mellitus without complication ([place]), Hyperlipidemia, Hypertensive heart disease (04/16/2016), Hypothyroidism, MI, old, Morbid obesity ([place]) (04/16/2016), S/P coronary artery stent placement (04/16/2016), and Tobacco abuse (04/16/2016).  Surgical History Patient  has a past surgical history that includes Coronary stent placement; Wrist surgery; Coronary angioplasty (02/2008); Abdominal hysterectomy; Cholecystectomy; Knee arthroscopy; Ankle surgery (Right); Rotator cuff repair (Bilateral); and Thoracic outlet surgery.  Family History Pateint's family history includes Cancer in her mother; Diabetes in her son; Hypertension in her son.  Social History Patient  reports that she has been smoking. She has a 15.50 pack-year smoking history. She has never used smokeless tobacco. She reports that she does not drink alcohol and does not use drugs.    Objective: Vitals:   04/26/20 1114  BP: 120/72  Pulse: 71  Temp: 97.6 F (36.4 C)  TempSrc: Temporal  SpO2: 93%  Weight: 264 lb (119.7 kg)  Height: 5\' 5"  (1.651 m)    Body mass index is 43.93 kg/m.  Physical Exam Vitals reviewed.  Constitutional:      Appearance: Normal appearance. She is obese.  HENT:     Head: Normocephalic and atraumatic.  Cardiovascular:     Rate and Rhythm: Normal rate and regular rhythm.     Heart sounds: Normal heart sounds.  Pulmonary:     Effort: Pulmonary effort is normal.  Breath sounds: Normal breath sounds.  Abdominal:      General: Bowel sounds are normal.     Palpations: Abdomen is soft.  Musculoskeletal:     Cervical back: Normal range of motion and neck supple.  Neurological:     General: No focal deficit present.     Mental Status: She is alert and oriented to person, place, and time.  Psychiatric:        Mood and Affect: Mood normal.        Behavior: Behavior normal.      Clinical Support from 11/21/2019 in [place]  PHQ-2 Total Score 0         Assessment/plan: 1. DM (diabetes mellitus) with complications ([place]), Labs today. Could not tolerate rybelsus. Hopefully the weight loss will have helped her sugar. If not discussed we may have to start back on insulin.  F/u in 3 months.  - CBC with Differential/Platelet; Future - Comprehensive metabolic panel; Future - Hemoglobin A1c; Future  2. Hypothyroidism (acquired), stable on Levothyroxine  - TSH; Future - T4, free; Future  3. Hyperlipidemia associated with type 2 diabetes mellitus ([place]), on Lipitor  - Lipid panel; Future  4. Polycythemia, secondary, followed by Heme, therapeutic phlebotomy Following cbcs. No indications for phlebotomy at this time. Will continue to follow cbcs.     This visit occurred during the SARS-CoV-2 public health emergency.  Safety protocols were in place, including screening questions prior to the visit, additional usage of staff PPE, and extensive cleaning of exam room while observing appropriate contact time as indicated for disinfecting solutions.     Return in about 3 months (around 07/27/2020) for diabetes.   Orma Flaming, MD [place]   04/26/2020

## 2020-04-26 NOTE — Patient Instructions (Addendum)
-  will check labs today and see where you are at.   -refilled your nebulizer and reflux medication.   -see you in 3 months!  Doing awesome on your weight! Keep it up   Dr. Rogers Blocker

## 2020-04-27 LAB — CBC WITH DIFFERENTIAL/PLATELET
Absolute Monocytes: 517 cells/uL (ref 200–950)
Basophils Absolute: 61 cells/uL (ref 0–200)
Basophils Relative: 0.8 %
Eosinophils Absolute: 167 cells/uL (ref 15–500)
Eosinophils Relative: 2.2 %
HCT: 52 % — ABNORMAL HIGH (ref 35.0–45.0)
Hemoglobin: 17 g/dL — ABNORMAL HIGH (ref 11.7–15.5)
Lymphs Abs: 2029 cells/uL (ref 850–3900)
MCH: 30.7 pg (ref 27.0–33.0)
MCHC: 32.7 g/dL (ref 32.0–36.0)
MCV: 94 fL (ref 80.0–100.0)
MPV: 10.8 fL (ref 7.5–12.5)
Monocytes Relative: 6.8 %
Neutro Abs: 4826 cells/uL (ref 1500–7800)
Neutrophils Relative %: 63.5 %
Platelets: 203 10*3/uL (ref 140–400)
RBC: 5.53 10*6/uL — ABNORMAL HIGH (ref 3.80–5.10)
RDW: 14.1 % (ref 11.0–15.0)
Total Lymphocyte: 26.7 %
WBC: 7.6 10*3/uL (ref 3.8–10.8)

## 2020-04-27 LAB — LIPID PANEL
Cholesterol: 124 mg/dL (ref <200)
HDL: 42 mg/dL — ABNORMAL LOW (ref >=50)
LDL Cholesterol (Calc): 57 mg/dL (calc)
Non-HDL Cholesterol (Calc): 82 mg/dL (calc) (ref <130)
Total CHOL/HDL Ratio: 3 (calc) (ref <5.0)
Triglycerides: 175 mg/dL — ABNORMAL HIGH (ref <150)

## 2020-04-27 LAB — COMPREHENSIVE METABOLIC PANEL
AG Ratio: 1.4 (calc) (ref 1.0–2.5)
ALT: 15 U/L (ref 6–29)
AST: 16 U/L (ref 10–35)
Albumin: 4 g/dL (ref 3.6–5.1)
Alkaline phosphatase (APISO): 87 U/L (ref 37–153)
BUN/Creatinine Ratio: 21 (calc) (ref 6–22)
BUN: 28 mg/dL — ABNORMAL HIGH (ref 7–25)
CO2: 28 mmol/L (ref 20–32)
Calcium: 9.7 mg/dL (ref 8.6–10.4)
Chloride: 103 mmol/L (ref 98–110)
Creat: 1.33 mg/dL — ABNORMAL HIGH (ref 0.50–0.99)
Globulin: 2.8 g/dL (calc) (ref 1.9–3.7)
Glucose, Bld: 138 mg/dL — ABNORMAL HIGH (ref 65–99)
Potassium: 4.1 mmol/L (ref 3.5–5.3)
Sodium: 140 mmol/L (ref 135–146)
Total Bilirubin: 1 mg/dL (ref 0.2–1.2)
Total Protein: 6.8 g/dL (ref 6.1–8.1)

## 2020-04-27 LAB — HEMOGLOBIN A1C
Hgb A1c MFr Bld: 6.9 % of total Hgb — ABNORMAL HIGH (ref <5.7)
Mean Plasma Glucose: 151 (calc)
eAG (mmol/L): 8.4 (calc)

## 2020-04-27 LAB — T4, FREE: Free T4: 1.5 ng/dL (ref 0.8–1.8)

## 2020-04-27 LAB — TSH: TSH: 0.35 mIU/L — ABNORMAL LOW (ref 0.40–4.50)

## 2020-05-02 ENCOUNTER — Telehealth: Payer: Self-pay | Admitting: Family Medicine

## 2020-05-02 NOTE — Telephone Encounter (Signed)
FYI

## 2020-05-02 NOTE — Telephone Encounter (Signed)
Patient stated she was in a lot of pain in her back its been like this since Friday. Patient wanted me to send a message to see if she could be seen today. Please advise.

## 2020-05-02 NOTE — Telephone Encounter (Signed)
Pt is scheduled for 05/03/2020.

## 2020-05-03 ENCOUNTER — Ambulatory Visit: Payer: Medicare HMO | Admitting: Family Medicine

## 2020-05-14 ENCOUNTER — Other Ambulatory Visit: Payer: Self-pay | Admitting: Family Medicine

## 2020-05-14 DIAGNOSIS — E039 Hypothyroidism, unspecified: Secondary | ICD-10-CM

## 2020-05-14 DIAGNOSIS — J4541 Moderate persistent asthma with (acute) exacerbation: Secondary | ICD-10-CM

## 2020-05-14 NOTE — Telephone Encounter (Signed)
  LAST APPOINTMENT DATE: 05/02/2020   NEXT APPOINTMENT DATE:@11 /04/2020  MEDICATION: albuterol (PROAIR HFA) 108 (90 Base) MCG/ACT inhaler // levothyroxine (SYNTHROID) 112 MCG tablet // SYMBICORT 80-4.5 MCG/ACT inhaler  PHARMACY: [facility] [place],  - [place] AT [address]  Comments: Patient is completely out of the synthroid and inhaler medicine. On last refill for Symbicort, wanted dr.wolfe to send in another refill.

## 2020-05-18 ENCOUNTER — Telehealth: Payer: Self-pay | Admitting: Family Medicine

## 2020-05-18 ENCOUNTER — Other Ambulatory Visit: Payer: Self-pay

## 2020-05-18 DIAGNOSIS — J4541 Moderate persistent asthma with (acute) exacerbation: Secondary | ICD-10-CM

## 2020-05-18 MED ORDER — ALBUTEROL SULFATE HFA 108 (90 BASE) MCG/ACT IN AERS: 2.0000 | INHALATION_SPRAY | Freq: Four times a day (QID) | RESPIRATORY_TRACT | 3 refills | Status: DC | PRN

## 2020-05-18 NOTE — Telephone Encounter (Signed)
MEDICATION: albuterol [facility] HFA) 108 (90 Base) MCG/ACT inhaler  PHARMACY:  [facility] [place], [place] AT [address] Phone:  [phone]  Fax:  [phone]       Comments:   **Let patient know to contact pharmacy at the end of the day to make sure medication is ready. **  ** Please notify patient to allow 48-72 hours to process**  **Encourage patient to contact the pharmacy for refills or they can request refills through [facility]**

## 2020-05-18 NOTE — Telephone Encounter (Signed)
Refill sent to pharmacy.   

## 2020-06-05 ENCOUNTER — Telehealth: Payer: Self-pay | Admitting: Family Medicine

## 2020-06-05 NOTE — Telephone Encounter (Signed)
error 

## 2020-06-11 ENCOUNTER — Other Ambulatory Visit: Payer: Self-pay | Admitting: Family Medicine

## 2020-06-11 DIAGNOSIS — E1169 Type 2 diabetes mellitus with other specified complication: Secondary | ICD-10-CM

## 2020-06-11 MED ORDER — INVOKANA 300 MG PO TABS: 300.0000 mg | ORAL_TABLET | Freq: Every day | ORAL | 1 refills | Status: DC

## 2020-06-11 NOTE — Telephone Encounter (Signed)
MEDICATION: INVOKANA 300 MG TABS tablet   PHARMACY:  [facility] [place], [place] AT [address] Phone:  [phone]  Fax:  [phone]       Comments:  Patient is out.    **Let patient know to contact pharmacy at the end of the day to make sure medication is ready. **  ** Please notify patient to allow 48-72 hours to process**  **Encourage patient to contact the pharmacy for refills or they can request refills through [facility]**

## 2020-06-23 ENCOUNTER — Other Ambulatory Visit: Payer: Self-pay | Admitting: Family Medicine

## 2020-06-26 ENCOUNTER — Ambulatory Visit (HOSPITAL_COMMUNITY)
Admission: RE | Admit: 2020-06-26 | Discharge: 2020-06-26 | Disposition: A | Discharge status: Home / Self Care | Payer: Medicare HMO | Source: Ambulatory Visit | Attending: Sports Medicine | Admitting: Sports Medicine

## 2020-06-26 ENCOUNTER — Other Ambulatory Visit: Payer: Self-pay

## 2020-06-26 ENCOUNTER — Other Ambulatory Visit (HOSPITAL_COMMUNITY): Payer: Self-pay | Admitting: Sports Medicine

## 2020-06-26 DIAGNOSIS — M79604 Pain in right leg: Secondary | ICD-10-CM | POA: Diagnosis not present

## 2020-06-26 DIAGNOSIS — M7989 Other specified soft tissue disorders: Secondary | ICD-10-CM

## 2020-06-26 DIAGNOSIS — M5459 Other low back pain: Secondary | ICD-10-CM | POA: Diagnosis not present

## 2020-06-26 DIAGNOSIS — M1711 Unilateral primary osteoarthritis, right knee: Secondary | ICD-10-CM | POA: Diagnosis not present

## 2020-06-29 ENCOUNTER — Telehealth: Payer: Self-pay

## 2020-06-29 NOTE — Telephone Encounter (Signed)
Patient is scheduled for Monday.   Nurse Assessment Nurse: Glean Salvo RN, Magda Paganini Date/Time Eilene Ghazi Time): 06/29/2020 12:56:42 PM Confirm and document reason for call. If symptomatic, describe symptoms. ---Caller states that she had a cortisone injection on Tuesday with her Orthopedic doctor and she has been getting bad coughing spells making her feel as though she can't catch her breath and she might pass out. This happened last time she got a cortisone shot also. She doesn't currently feel this way but it is coming in spurts. Does the patient have any new or worsening symptoms? ---Yes Will a triage be completed? ---Yes Related visit to physician within the last 2 weeks? ---No Does the PT have any chronic conditions? (i.e. diabetes, asthma, this includes High risk factors for pregnancy, etc.) ---Yes List chronic conditions. ---COPD, Asthma Is this a behavioral health or substance abuse call? ---No Guidelines Guideline Title Affirmed Question Affirmed Notes Nurse Date/Time (Eastern Time) Asthma Attack No asthma check-up in > 6 months Rebecca Eaton 06/29/2020 12:59:25 PM Disp. Time Eilene Ghazi Time) Disposition Final User 06/29/2020 1:09:06 PM See PCP within 2 Weeks Yes Glean Salvo, RN, Magda Paganini PLEASE NOTE: All timestamps contained within this report are represented as [place] Standard Time. CONFIDENTIALTY NOTICE: This fax transmission is intended only for the addressee. It contains information that is legally privileged, confidential or otherwise protected from use or disclosure. If you are not the intended recipient, you are strictly prohibited from reviewing, disclosing, copying using or disseminating any of this information or taking any action in reliance on or regarding this information. If you have received this fax in error, please notify us immediately by telephone so that we can arrange for its return to [place]. Phone: [phone], Toll-Free: [phone], Fax: [phone] Page: 2 of  2 Call Id: [id number] [place] Disagree/Comply Comply Caller Understands Yes PreDisposition Call Doctor Care Advice Given Per Guideline SEE PCP WITHIN 2 WEEKS: * You need to be seen for this ongoing problem within the next 2 weeks. * PCP VISIT: Call your doctor (or NP/PA) during regular office hours and make an appointment. * Start your quick-relief medicine (e.g., albuterol, salbutamol) at the first sign of any coughing or shortness of breath (don't wait for wheezing). ASTHMA QUICK-RELIEF MEDICINE: * Use inhaler (2 puffs each time) or nebulizer every 4 hours. * Sometimes a MEDICINE can trigger an asthma attack. Aspirin and non-steroidal anti-inflammatory drugs (NSAIDs) such as ibuprofen (e.g., Advil, Motrin) and naproxen (Aleve, Naprosyn) can trigger an asthma attack in some people. Talk with your doctor or other healthcare provider before taking a new medicine. * Wheezing is not improved after neb or inhaler CALL BACK IF: * Inhaled asthma medicine (neb or MDI) is needed more often than every 4 hours * You become worse CARE ADVICE given per Asthma Attack (Adult) guideline. * Wheezing is not completely cleared by 5 days Comments User: [name] Gandy, RN Date/Time Eilene Ghazi Time): 06/29/2020 1:10:05 PM Pt denies current sx of cough or SOB. no wheezing or SOB noted at this time

## 2020-07-02 ENCOUNTER — Other Ambulatory Visit: Payer: Self-pay

## 2020-07-02 ENCOUNTER — Encounter: Payer: Self-pay | Admitting: Family Medicine

## 2020-07-02 ENCOUNTER — Ambulatory Visit (INDEPENDENT_AMBULATORY_CARE_PROVIDER_SITE_OTHER): Payer: Medicare HMO | Admitting: Family Medicine

## 2020-07-02 VITALS — BP 140/76 | HR 86 | Temp 97.8°F | Ht 65.0 in | Wt 259.8 lb

## 2020-07-02 DIAGNOSIS — T50905A Adverse effect of unspecified drugs, medicaments and biological substances, initial encounter: Secondary | ICD-10-CM | POA: Diagnosis not present

## 2020-07-02 DIAGNOSIS — R0602 Shortness of breath: Secondary | ICD-10-CM | POA: Diagnosis not present

## 2020-07-02 DIAGNOSIS — R059 Cough, unspecified: Secondary | ICD-10-CM | POA: Diagnosis not present

## 2020-07-02 DIAGNOSIS — F419 Anxiety disorder, unspecified: Secondary | ICD-10-CM | POA: Diagnosis not present

## 2020-07-02 DIAGNOSIS — Z6841 Body Mass Index (BMI) 40.0 and over, adult: Secondary | ICD-10-CM | POA: Diagnosis not present

## 2020-07-02 MED ORDER — ALPRAZOLAM 0.5 MG PO TABS: 0.5000 mg | ORAL_TABLET | Freq: Two times a day (BID) | ORAL | 3 refills | Status: DC | PRN

## 2020-07-02 NOTE — Progress Notes (Signed)
Patient: [name] [name] MRN: [id number] DOB: [birth year] PCP: Orma Flaming, MD     Subjective:  Chief Complaint  Patient presents with  . Cough    Pt had Cortisone injection, and     HPI: The patient is a 69 y.o. female who presents today for SOB, and coughing since having a Cortisone injection last Tuesday.  She states she was wheezing, coughing and feeling like she was going to pass out on Wednesday, Thursday and Friday. She is better, but still has some intermittent wheezing. She never took any benadryl or other allergy medication. She has had to use her inhaler at last 4 times daily. Overall feeling much better.   She has no stridor or difficulty breathing.    Review of Systems  Constitutional: Positive for appetite change.  HENT: Negative for congestion and sore throat.   Respiratory: Positive for cough. Negative for shortness of breath, wheezing and stridor.   Cardiovascular: Negative for chest pain and palpitations.  Neurological: Negative for dizziness, light-headedness and headaches.    Allergies Patient is allergic to clindamycin/lincomycin, codeine, cortisone, dilaudid [hydromorphone hcl], iodides, reglan [metoclopramide], tramadol, and tape.  Past Medical History Patient  has a past medical history of Bronchitis, asthmatic, Coronary artery disease, Diabetes mellitus without complication ([place]), Hyperlipidemia, Hypertensive heart disease (04/16/2016), Hypothyroidism, MI, old, Morbid obesity ([place]) (04/16/2016), S/P coronary artery stent placement (04/16/2016), and Tobacco abuse (04/16/2016).  Surgical History Patient  has a past surgical history that includes Coronary stent placement; Wrist surgery; Coronary angioplasty (02/2008); Abdominal hysterectomy; Cholecystectomy; Knee arthroscopy; Ankle surgery (Right); Rotator cuff repair (Bilateral); and Thoracic outlet surgery.  Family History Pateint's family history includes Cancer in her mother; Diabetes in her son; Hypertension  in her son.  Social History Patient  reports that she has been smoking. She has a 15.50 pack-year smoking history. She has never used smokeless tobacco. She reports that she does not drink alcohol and does not use drugs.    Objective: Vitals:   07/02/20 1424  BP: 140/76  Pulse: 86  Temp: 97.8 F (36.6 C)  TempSrc: Temporal  SpO2: 97%  Weight: 259 lb 12.8 oz (117.8 kg)  Height: $Remove'5\' 5"'aLTJvyO$  (1.651 m)    Body mass index is 43.23 kg/m.  Physical Exam Vitals reviewed.  Constitutional:      Appearance: Normal appearance. She is obese.     Comments: Tobacco odor   HENT:     Head: Normocephalic and atraumatic.  Cardiovascular:     Rate and Rhythm: Normal rate and regular rhythm.     Heart sounds: Normal heart sounds.  Pulmonary:     Effort: Pulmonary effort is normal.     Breath sounds: Normal breath sounds. No stridor. No wheezing.  Skin:    General: Skin is warm.     Capillary Refill: Capillary refill takes less than 2 seconds.  Neurological:     General: No focal deficit present.     Mental Status: She is alert and oriented to person, place, and time.        Assessment/plan: 1. Adverse effect of drug, initial encounter -adverse event from cortisone. Seems to be resolved now. Added to allergy.  -can do a few doses of benadryl, but lungs are clear.  -also discussed ? Copd exacerbation but doesn't met criteria and is improving. She is to keep me posted.   2. Refilled xanax.  pmp website reviewed. Filling correctly.     This visit occurred during the SARS-CoV-2 public health emergency.  Safety protocols  were in place, including screening questions prior to the visit, additional usage of staff PPE, and extensive cleaning of exam room while observing appropriate contact time as indicated for disinfecting solutions.     Return if symptoms worsen or fail to improve.   Orma Flaming, MD [place]   07/02/2020

## 2020-07-02 NOTE — Patient Instructions (Addendum)
-  no more cortisone!!!! -your lungs sound great and oxygen is great.  -I want you to watch out for signs of a copd exacerbation. Increased sputum, increased cough or increased work of breathing/coughing. Let me know so I can send in antibiotics, but you look great now with no indication for medication at this time.   So good to see you!  Dr. [place] Blocker

## 2020-07-16 ENCOUNTER — Telehealth: Payer: Self-pay

## 2020-07-16 ENCOUNTER — Other Ambulatory Visit: Payer: Self-pay | Admitting: Family Medicine

## 2020-07-16 DIAGNOSIS — R600 Localized edema: Secondary | ICD-10-CM

## 2020-07-16 NOTE — Telephone Encounter (Signed)
..   LAST APPOINTMENT DATE: 07/02/2020   NEXT APPOINTMENT DATE:@11 /04/2020  MEDICATION: furosemide (LASIX) 20 MG tablet   PHARMACY:

## 2020-07-16 NOTE — Telephone Encounter (Signed)
Furosemide refilled today to preferred pharmacy

## 2020-07-30 ENCOUNTER — Other Ambulatory Visit: Payer: Self-pay

## 2020-07-30 ENCOUNTER — Ambulatory Visit (INDEPENDENT_AMBULATORY_CARE_PROVIDER_SITE_OTHER): Payer: Medicare HMO | Admitting: Family Medicine

## 2020-07-30 ENCOUNTER — Encounter: Payer: Self-pay | Admitting: Family Medicine

## 2020-07-30 VITALS — BP 106/68 | HR 85 | Temp 97.7°F | Ht 65.0 in | Wt 259.2 lb

## 2020-07-30 DIAGNOSIS — D751 Secondary polycythemia: Secondary | ICD-10-CM | POA: Diagnosis not present

## 2020-07-30 DIAGNOSIS — E039 Hypothyroidism, unspecified: Secondary | ICD-10-CM

## 2020-07-30 DIAGNOSIS — E118 Type 2 diabetes mellitus with unspecified complications: Secondary | ICD-10-CM

## 2020-07-30 DIAGNOSIS — E876 Hypokalemia: Secondary | ICD-10-CM

## 2020-07-30 DIAGNOSIS — Z23 Encounter for immunization: Secondary | ICD-10-CM

## 2020-07-30 NOTE — Progress Notes (Signed)
Patient: [name] [name] MRN: [id number] DOB: 1951/02/04 PCP: Orma Flaming, MD     Subjective:  Chief Complaint  Patient presents with  . Diabetes  . Anxiety  . Hypothyroidism    HPI: The patient is a 69 y.o. female who presents today for DM, Hypothyroidism, and Anxiety.  Diabetes Patient is here for follow up of type 2 diabetes. Currently on the following medications invokanaand we started tradjenta at her last visit. I also had ordered rybelsus for her. could not tolerate rybelsus. Takes medications as prescribed. Last A1C was6.9. Currentlynotexercising and following diabetic diet. Sugars range from116to 160. Denies any hypoglycemic events. Denies any vision changes, nausea, vomiting, abdominal pain, ulcers/paraesthesia in feet, polyuria, polydipsia or polyphagia. Denies any chest pain, shortness of breath.She was started on rybelsus, but had to stop due to side effects of medication. Metformin was stopped due to bump in creatinine.Weight has been stable.   Hypothyroidism Was slightly out of range last check. Will repeat today. Euthyroid, no complaints of symptoms.   Polycythemia Repeat cbc today. Clinically stable.   On spironolactone and unsure why. No hx of heart failure or HTN.   Flu shot today  Review of Systems  Constitutional: Negative for chills, fatigue and fever.  HENT: Negative for dental problem, ear pain, hearing loss and trouble swallowing.   Eyes: Negative for visual disturbance.  Respiratory: Negative for cough, chest tightness and shortness of breath.   Cardiovascular: Negative for chest pain, palpitations and leg swelling.  Gastrointestinal: Negative for abdominal pain, blood in stool, diarrhea and nausea.  Endocrine: Negative for cold intolerance, polydipsia, polyphagia and polyuria.  Genitourinary: Negative for dysuria and hematuria.  Musculoskeletal: Negative for arthralgias.  Skin: Negative for rash.  Neurological: Negative for dizziness and  headaches.  Psychiatric/Behavioral: Negative for dysphoric mood and sleep disturbance. The patient is not nervous/anxious.     Allergies Patient is allergic to clindamycin/lincomycin, codeine, cortisone, dilaudid [hydromorphone hcl], iodides, reglan [metoclopramide], tramadol, and tape.  Past Medical History Patient  has a past medical history of Bronchitis, asthmatic, Coronary artery disease, Diabetes mellitus without complication ([place]), Hyperlipidemia, Hypertensive heart disease (04/16/2016), Hypothyroidism, MI, old, Morbid obesity ([place]) (04/16/2016), S/P coronary artery stent placement (04/16/2016), and Tobacco abuse (04/16/2016).  Surgical History Patient  has a past surgical history that includes Coronary stent placement; Wrist surgery; Coronary angioplasty (02/2008); Abdominal hysterectomy; Cholecystectomy; Knee arthroscopy; Ankle surgery (Right); Rotator cuff repair (Bilateral); and Thoracic outlet surgery.  Family History Pateint's family history includes Cancer in her mother; Diabetes in her son; Hypertension in her son.  Social History Patient  reports that she has been smoking. She has a 15.50 pack-year smoking history. She has never used smokeless tobacco. She reports that she does not drink alcohol and does not use drugs.    Objective: Vitals:   07/30/20 1036  BP: 106/68  Pulse: 85  Temp: 97.7 F (36.5 C)  TempSrc: Temporal  SpO2: 95%  Weight: 259 lb 3.2 oz (117.6 kg)  Height: 5\' 5"  (1.651 m)    Body mass index is 43.13 kg/m.  Physical Exam Vitals reviewed.  Constitutional:      Appearance: Normal appearance. She is obese.     Comments: Smoke odor   HENT:     Head: Normocephalic and atraumatic.  Eyes:     Extraocular Movements: Extraocular movements intact.     Pupils: Pupils are equal, round, and reactive to light.  Cardiovascular:     Rate and Rhythm: Normal rate and regular rhythm.  Heart sounds: Normal heart sounds.  Pulmonary:     Effort: Pulmonary  effort is normal.     Breath sounds: Normal breath sounds.  Abdominal:     General: Bowel sounds are normal.     Palpations: Abdomen is soft.  Musculoskeletal:     Cervical back: Normal range of motion and neck supple.  Skin:    Capillary Refill: Capillary refill takes less than 2 seconds.  Neurological:     General: No focal deficit present.     Mental Status: She is alert and oriented to person, place, and time.  Psychiatric:        Mood and Affect: Mood normal.        Behavior: Behavior normal.     Comments: Tearful when discussing her pets deaths and death of a family member.         GAD 7 : Generalized Anxiety Score 07/30/2020 12/19/2019  Nervous, Anxious, on Edge 1 0  Control/stop worrying 0 1  Worry too much - different things 1 1  Trouble relaxing 0 1  Restless 0 0  Easily annoyed or irritable 0 1  Afraid - awful might happen 0 0  Total GAD 7 Score 2 4  Anxiety Difficulty Not difficult at all Somewhat difficult     Assessment/plan: 1. DM (diabetes mellitus) with complications ([place]), Recheck a1c today and renal function.  Has had covid vaccines.  -foot exam next visit.  -can not urinate today for up/uc. Will do next viisit.  -if a1c to goal can go 6 months. Again really stressed importance of weight loss -encouraged her to stop smoking.   - Hemoglobin A1c; Future - COMPLETE METABOLIC PANEL WITH GFR; Future - COMPLETE METABOLIC PANEL WITH GFR - Hemoglobin A1c  2. Hypothyroidism (acquired), stable on Levothyroxine  - TSH; Future - T4, free; Future - T4, free - TSH  3. Polycythemia, secondary, followed by Heme, therapeutic phlebotomy  - CBC with Differential/Platelet; Future - CBC with Differential/Platelet  4. Need for immunization against influenza  - Flu Vaccine QUAD High Dose(Fluad)  5. Stopping her spironolactone. Will wean down to 1/2 pill daily x 1 week then stop. Repeat potassium in one month.   This visit occurred during the SARS-CoV-2  public health emergency.  Safety protocols were in place, including screening questions prior to the visit, additional usage of staff PPE, and extensive cleaning of exam room while observing appropriate contact time as indicated for disinfecting solutions.     Return in about 6 months (around 01/27/2021) for routine f/u with fating labs. Orma Flaming, MD [place]   07/30/2020

## 2020-07-30 NOTE — Patient Instructions (Addendum)
-  trial off spironolactone.. not sure why you are on this...  Could drop down to every other day and then stop after a week.   Let's check potassium in 4 weeks. Just come in for lab real quick.    Everything else looks great! Aw

## 2020-07-31 LAB — COMPLETE METABOLIC PANEL WITH GFR
AG Ratio: 1.5 (calc) (ref 1.0–2.5)
ALT: 12 U/L (ref 6–29)
AST: 13 U/L (ref 10–35)
Albumin: 4.1 g/dL (ref 3.6–5.1)
Alkaline phosphatase (APISO): 98 U/L (ref 37–153)
BUN/Creatinine Ratio: 16 (calc) (ref 6–22)
BUN: 24 mg/dL (ref 7–25)
CO2: 31 mmol/L (ref 20–32)
Calcium: 10.2 mg/dL (ref 8.6–10.4)
Chloride: 100 mmol/L (ref 98–110)
Creat: 1.46 mg/dL — ABNORMAL HIGH (ref 0.50–0.99)
GFR, Est African American: 43 mL/min/{1.73_m2} — ABNORMAL LOW (ref >=60)
GFR, Est Non African American: 37 mL/min/{1.73_m2} — ABNORMAL LOW (ref >=60)
Globulin: 2.8 g/dL (calc) (ref 1.9–3.7)
Glucose, Bld: 193 mg/dL — ABNORMAL HIGH (ref 65–99)
Potassium: 4.2 mmol/L (ref 3.5–5.3)
Sodium: 141 mmol/L (ref 135–146)
Total Bilirubin: 0.8 mg/dL (ref 0.2–1.2)
Total Protein: 6.9 g/dL (ref 6.1–8.1)

## 2020-07-31 LAB — CBC WITH DIFFERENTIAL/PLATELET
Absolute Monocytes: 612 cells/uL (ref 200–950)
Basophils Absolute: 34 cells/uL (ref 0–200)
Basophils Relative: 0.4 %
Eosinophils Absolute: 119 cells/uL (ref 15–500)
Eosinophils Relative: 1.4 %
HCT: 51.5 % — ABNORMAL HIGH (ref 35.0–45.0)
Hemoglobin: 17.2 g/dL — ABNORMAL HIGH (ref 11.7–15.5)
Lymphs Abs: 1930 cells/uL (ref 850–3900)
MCH: 30.8 pg (ref 27.0–33.0)
MCHC: 33.4 g/dL (ref 32.0–36.0)
MCV: 92.3 fL (ref 80.0–100.0)
MPV: 10.8 fL (ref 7.5–12.5)
Monocytes Relative: 7.2 %
Neutro Abs: 5806 cells/uL (ref 1500–7800)
Neutrophils Relative %: 68.3 %
Platelets: 200 10*3/uL (ref 140–400)
RBC: 5.58 10*6/uL — ABNORMAL HIGH (ref 3.80–5.10)
RDW: 13.6 % (ref 11.0–15.0)
Total Lymphocyte: 22.7 %
WBC: 8.5 10*3/uL (ref 3.8–10.8)

## 2020-07-31 LAB — HEMOGLOBIN A1C
Hgb A1c MFr Bld: 7.2 % of total Hgb — ABNORMAL HIGH (ref <5.7)
Mean Plasma Glucose: 160 (calc)
eAG (mmol/L): 8.9 (calc)

## 2020-07-31 LAB — TSH: TSH: 0.27 mIU/L — ABNORMAL LOW (ref 0.40–4.50)

## 2020-07-31 LAB — T4, FREE: Free T4: 1.7 ng/dL (ref 0.8–1.8)

## 2020-08-02 ENCOUNTER — Telehealth: Payer: Self-pay

## 2020-08-02 ENCOUNTER — Other Ambulatory Visit: Payer: Self-pay | Admitting: Family Medicine

## 2020-08-02 DIAGNOSIS — E039 Hypothyroidism, unspecified: Secondary | ICD-10-CM

## 2020-08-02 DIAGNOSIS — N1832 Chronic kidney disease, stage 3b: Secondary | ICD-10-CM | POA: Insufficient documentation

## 2020-08-02 DIAGNOSIS — N183 Chronic kidney disease, stage 3 unspecified: Secondary | ICD-10-CM

## 2020-08-02 MED ORDER — LEVOTHYROXINE SODIUM 100 MCG PO TABS: 100.0000 ug | ORAL_TABLET | Freq: Every day | ORAL | 3 refills | Status: DC

## 2020-08-02 NOTE — Telephone Encounter (Signed)
Patient is calling in asking if Dr.Wolfe has reviewed her lab results.

## 2020-08-02 NOTE — Telephone Encounter (Signed)
I spoke with pt to remind her, that when labs come in and are reviewed by Dr. Rogers Blocker, someone from our office will give her a call. Pt voiced understanding.

## 2020-08-10 ENCOUNTER — Other Ambulatory Visit: Payer: Self-pay

## 2020-08-10 ENCOUNTER — Encounter: Payer: Self-pay | Admitting: Family Medicine

## 2020-08-10 ENCOUNTER — Telehealth (INDEPENDENT_AMBULATORY_CARE_PROVIDER_SITE_OTHER): Payer: Medicare HMO | Admitting: Family Medicine

## 2020-08-10 VITALS — Ht 65.0 in | Wt 259.0 lb

## 2020-08-10 DIAGNOSIS — E118 Type 2 diabetes mellitus with unspecified complications: Secondary | ICD-10-CM | POA: Diagnosis not present

## 2020-08-10 DIAGNOSIS — N1832 Chronic kidney disease, stage 3b: Secondary | ICD-10-CM | POA: Diagnosis not present

## 2020-08-10 DIAGNOSIS — E039 Hypothyroidism, unspecified: Secondary | ICD-10-CM | POA: Diagnosis not present

## 2020-08-10 DIAGNOSIS — D751 Secondary polycythemia: Secondary | ICD-10-CM

## 2020-08-10 MED ORDER — POTASSIUM CHLORIDE CRYS ER 10 MEQ PO TBCR: 10.0000 meq | EXTENDED_RELEASE_TABLET | Freq: Every day | ORAL | 1 refills | Status: DC

## 2020-08-10 NOTE — Progress Notes (Signed)
Patient: [name] [name] MRN: [id number] DOB: 06/03/1951 PCP: Orma Flaming, MD     I connected with Randol Kern on 08/10/20 at 2:05pm by a video enabled telemedicine application and verified that I am speaking with the correct person using two identifiers.  Location patient: Home Location provider: [facility], Office Persons participating in this virtual visit: Rhett Stille and Dr. Rogers Blocker   I discussed the limitations of evaluation and management by telemedicine and the availability of in person appointments. The patient expressed understanding and agreed to proceed.   Subjective:  Chief Complaint  Patient presents with  . Chronic Kidney Disease  . Diabetes  . Hypothyroidism  . polycythemia    HPI: The patient is a 69 y.o. female who presents today to review DM lab work. She has no further concerns.  Diabetes type 2 -on tradjenta 5mg  daily and invokana 300mg . I wanted to talk to her because her GFR has decreased down to 37 and we need to back off the invokana or stop. Next step would be insulin as she can not have metformin and could not tolerate the GLP1 drugs both injectable and oral.  -she has never been on glipizide. She states her a1c at 7.2 was due to her having steroid shots and her sugars have been doing well. This AM it was 90,but most of the time it's 140 or less. Before this it was 6.9 and she has lost over 90 pounds.   CKD -has been trending upward since she transferred care to me from Dr. Juleen China. She has not taken any NSAIDs and metformin was stopped when renal function began to rise. Last normal renal function was in 09/2018. Recent cmp showed her GFR was 37.  Her last creatinine was 1.46 this last visit.   Polycythemia hgb was 17.4. discussed would do therapeutic phlebotomy.   Hypothyroidism Has had 2 hyper readings. I have already sent in lower dose medication and discussed follow up for labs.   I stopped her spironolactone on last visit. She states she was put on it  to "retain potassium." has not returned for a potassium follow up.   Review of Systems  Constitutional: Negative for chills, fatigue and fever.  HENT: Negative for dental problem, ear pain, hearing loss and trouble swallowing.   Eyes: Negative for visual disturbance.  Respiratory: Positive for cough. Negative for chest tightness, shortness of breath and wheezing.   Cardiovascular: Negative for chest pain, palpitations and leg swelling.  Gastrointestinal: Negative for abdominal pain, blood in stool, diarrhea and nausea.  Endocrine: Negative for cold intolerance, polydipsia, polyphagia and polyuria.  Genitourinary: Negative for dysuria and hematuria.  Musculoskeletal: Negative for arthralgias.  Skin: Negative for rash.  Neurological: Negative for dizziness and headaches.  Psychiatric/Behavioral: Negative for dysphoric mood and sleep disturbance. The patient is not nervous/anxious.     Allergies Patient is allergic to clindamycin/lincomycin, codeine, cortisone, dilaudid [hydromorphone hcl], iodides, reglan [metoclopramide], tramadol, and tape.  Past Medical History Patient  has a past medical history of Bronchitis, asthmatic, Coronary artery disease, Diabetes mellitus without complication ([place]), Hyperlipidemia, Hypertensive heart disease (04/16/2016), Hypothyroidism, MI, old, Morbid obesity ([place]) (04/16/2016), S/P coronary artery stent placement (04/16/2016), and Tobacco abuse (04/16/2016).  Surgical History Patient  has a past surgical history that includes Coronary stent placement; Wrist surgery; Coronary angioplasty (02/2008); Abdominal hysterectomy; Cholecystectomy; Knee arthroscopy; Ankle surgery (Right); Rotator cuff repair (Bilateral); and Thoracic outlet surgery.  Family History Pateint's family history includes Cancer in her mother; Diabetes in her son; Hypertension  in her son.  Social History Patient  reports that she has been smoking. She has a 15.50 pack-year smoking history. She  has never used smokeless tobacco. She reports that she does not drink alcohol and does not use drugs.    Objective: Vitals:   08/10/20 1341  Weight: 259 lb (117.5 kg)  Height: 5\' 5"  (1.651 m)    Body mass index is 43.1 kg/m.  Physical Exam Constitutional:      Appearance: Normal appearance. She is obese.  HENT:     Head: Normocephalic and atraumatic.  Pulmonary:     Effort: Pulmonary effort is normal.  Neurological:     General: No focal deficit present.     Mental Status: She is alert and oriented to person, place, and time.  Psychiatric:        Mood and Affect: Mood normal.        Behavior: Behavior normal.        Assessment/plan: 1. DM (diabetes mellitus) with complications ([place]), -continue tradjenta although likely not getting much a1c lowering potential with this.  -invokana will be decreased down to 100mg  (150mg  until out of her 300mg  pill)  She is very keen to stay off insulin. We could do glipizide next if another agent is needed to try and avoid insulin. Again discussed diet and weight loss will be key to keeping her off insulin. Hypoglycemic precautions given.  F/u in 3 months for a1c and repeat labs.  -needs foot exam and up/uc.   2. Stage 3b chronic kidney disease ([place]) ? If from uncontrolled diabetes. Discussed nephrotoxic drugs. Glad we stopped the spironolactone. Discussed stopping the invokana, but she would rather decrease dosage down than stop. Discussed with her GFR we can do 100mg . She is going to take 1/2 a 300mg  pill because she has so many of these and I am fine with that.   3. Polycythemia, secondary, followed by Heme, therapeutic phlebotomy recommended therapeutic phlebotomy   4. Hypothyroidism (acquired), stable on Levothyroxine Have already changed dosage. She has picked this up and has started it. Repeat labs in 6-8 weeks.    Needs to come back for repeat potassium. Orders in and she will come on Monday.  Also discussed why on chronic  lasix?? She states for fluid???  Will add on low dose daily potassium pill and she is going to try and use this as needed as I do not like using lasix for just "fluid." will only take potassium when takes a lasix.   Return in about 3 months (around 11/10/2020) for diabetes. Orma Flaming, MD [facility] [facility]  08/10/2020

## 2020-08-27 ENCOUNTER — Telehealth: Payer: Self-pay

## 2020-08-27 MED ORDER — LINAGLIPTIN 5 MG PO TABS: ORAL_TABLET | ORAL | 1 refills | Status: DC

## 2020-08-27 NOTE — Telephone Encounter (Signed)
MEDICATION: Tradjenta 5 MG  PHARMACY: [facility] [name]  Comments:   **Let patient know to contact pharmacy at the end of the day to make sure medication is ready. **  ** Please notify patient to allow 48-72 hours to process**  **Encourage patient to contact the pharmacy for refills or they can request refills through [facility]**

## 2020-08-27 NOTE — Telephone Encounter (Signed)
Please remind her to come in for lab only to recheck potassium.  Thanks,  Dr. Rogers Blocker

## 2020-08-29 NOTE — Telephone Encounter (Signed)
Thyroid labs are already in. As discussed her thyroid needed rechecked.  Orma Flaming, MD [place]

## 2020-08-29 NOTE — Telephone Encounter (Signed)
Called to give message below. Pt would also like to recheck her Thyroid she says she is not sleeping good, and says that is a sign it is out of control.

## 2020-09-06 ENCOUNTER — Other Ambulatory Visit: Payer: Medicare HMO

## 2020-09-06 DIAGNOSIS — E876 Hypokalemia: Secondary | ICD-10-CM | POA: Diagnosis not present

## 2020-09-06 DIAGNOSIS — E039 Hypothyroidism, unspecified: Secondary | ICD-10-CM | POA: Diagnosis not present

## 2020-09-07 LAB — BASIC METABOLIC PANEL
BUN/Creatinine Ratio: 12 (calc) (ref 6–22)
BUN: 16 mg/dL (ref 7–25)
CO2: 28 mmol/L (ref 20–32)
Calcium: 9.6 mg/dL (ref 8.6–10.4)
Chloride: 102 mmol/L (ref 98–110)
Creat: 1.32 mg/dL — ABNORMAL HIGH (ref 0.50–0.99)
Glucose, Bld: 161 mg/dL — ABNORMAL HIGH (ref 65–99)
Potassium: 4.4 mmol/L (ref 3.5–5.3)
Sodium: 141 mmol/L (ref 135–146)

## 2020-09-07 LAB — TSH: TSH: 0.59 mIU/L (ref 0.40–4.50)

## 2020-09-07 LAB — T4, FREE: Free T4: 1.5 ng/dL (ref 0.8–1.8)

## 2020-09-19 ENCOUNTER — Telehealth: Payer: Self-pay | Admitting: Cardiovascular Disease

## 2020-09-19 NOTE — Telephone Encounter (Signed)
Spoke with pharmacy in regards to recent refill request for imdur and informed [name] [name] that her refill will be ready in a hour  And that she also has refills left on her script.

## 2020-09-19 NOTE — Telephone Encounter (Signed)
° ° ° °*  STAT* If patient is at the pharmacy, call can be transferred to refill team.   1. Which medications need to be refilled? (please list name of each medication and dose if known) isosorbide mononitrate (IMDUR) 60 MG 24 hr tablet  2. Which pharmacy/location (including street and city if local pharmacy) is medication to be sent to? [id number] - [place], [place] - [address] AT [facility] [address]  3. Do they need a 30 day or 90 day supply? 90 days

## 2020-09-28 ENCOUNTER — Encounter: Payer: Self-pay | Admitting: Family Medicine

## 2020-09-28 ENCOUNTER — Telehealth (INDEPENDENT_AMBULATORY_CARE_PROVIDER_SITE_OTHER): Payer: Medicare HMO | Admitting: Family Medicine

## 2020-09-28 ENCOUNTER — Other Ambulatory Visit: Payer: Self-pay

## 2020-09-28 VITALS — Ht 65.0 in | Wt 259.0 lb

## 2020-09-28 DIAGNOSIS — J069 Acute upper respiratory infection, unspecified: Secondary | ICD-10-CM

## 2020-09-28 NOTE — Progress Notes (Signed)
Patient: [name] [name] MRN: [id number] DOB: 03/14/1951 PCP: Orma Flaming, MD     I connected with Randol Kern on 09/28/20 at 1:20pm by a video enabled telemedicine application and verified that I am speaking with the correct person using two identifiers.  Location patient: Home Location provider: [facility], Office Persons participating in this virtual visit: Hailie Fulfer and Dr. Rogers Blocker  I discussed the limitations of evaluation and management by telemedicine and the availability of in person appointments. The patient expressed understanding and agreed to proceed.   Subjective:  Chief Complaint  Patient presents with  . Cough  . Sinusitis  . Nasal Congestion    HPI: The patient is a 70 y.o. female who presents today for sinus drainage, sore throat, diarrhea, and coughing.  Did nebs, took meds, layed back down and now feels better. She states symptoms started yesterday with sinus drainage, coughing, sinus pressure/pain above her eyes and started to have diarrhea early this am, but has not had another bowel movement since 7am. NO fever/chills, no shortness of breath. She does have productive cough that is clear in nature. Her husband had similar symptoms. She states she has not been out of house since Monday. She had her booster scheduled, but canceled this. She has been using her atrovent nasal spray which has helped her some. She is just worried because she doesn't want this to go to her lungs. Overall she feels better today. She is also using honey cough drops.   She is vaccinated: moderna: 02/02/2020 and 03/05/20.   Review of Systems  Constitutional: Negative for chills, fatigue and fever.  HENT: Positive for congestion, sinus pressure and sinus pain. Negative for sore throat.   Respiratory: Positive for cough. Negative for shortness of breath.   Neurological: Negative for dizziness and headaches.    Allergies Patient is allergic to clindamycin/lincomycin, codeine, cortisone, dilaudid  [hydromorphone hcl], iodides, reglan [metoclopramide], tramadol, and tape.  Past Medical History Patient  has a past medical history of Bronchitis, asthmatic, Coronary artery disease, Diabetes mellitus without complication ([place]), Hyperlipidemia, Hypertensive heart disease (04/16/2016), Hypothyroidism, MI, old, Morbid obesity ([place]) (04/16/2016), S/P coronary artery stent placement (04/16/2016), and Tobacco abuse (04/16/2016).  Surgical History Patient  has a past surgical history that includes Coronary stent placement; Wrist surgery; Coronary angioplasty (02/2008); Abdominal hysterectomy; Cholecystectomy; Knee arthroscopy; Ankle surgery (Right); Rotator cuff repair (Bilateral); and Thoracic outlet surgery.  Family History Pateint's family history includes Cancer in her mother; Diabetes in her son; Hypertension in her son.  Social History Patient  reports that she has been smoking. She has a 15.50 pack-year smoking history. She has never used smokeless tobacco. She reports that she does not drink alcohol and does not use drugs.    Objective: Vitals:   09/28/20 1307  Weight: 259 lb 0.7 oz (117.5 kg)  Height: 5\' 5"  (1.651 m)    Body mass index is 43.11 kg/m.  Physical Exam Vitals reviewed.  Constitutional:      General: She is not in acute distress.    Appearance: Normal appearance. She is obese. She is not ill-appearing.  HENT:     Head: Normocephalic and atraumatic.  Pulmonary:     Effort: Pulmonary effort is normal.  Neurological:     General: No focal deficit present.     Mental Status: She is alert and oriented to person, place, and time.        Assessment/plan: 1. Viral URI No signs of bacterial infection. She hasn't been anywhere and  doesn't want to get tested for covid and has no plans on leaving house. Will continue with conservative therapy. Recommended nasal spray, cool mist humidifier, honey, and her cough medication. If fever, worsening symptoms she is to let me know.        Return in about 2 months (around 11/26/2020) for diabetic follow up .    Orma Flaming, MD [place]  09/28/2020

## 2020-10-03 ENCOUNTER — Other Ambulatory Visit: Payer: Self-pay | Admitting: Family Medicine

## 2020-10-03 ENCOUNTER — Telehealth: Payer: Self-pay

## 2020-10-03 MED ORDER — AMOXICILLIN-POT CLAVULANATE 875-125 MG PO TABS: 1.0000 | ORAL_TABLET | Freq: Two times a day (BID) | ORAL | 0 refills | Status: DC

## 2020-10-03 NOTE — Telephone Encounter (Signed)
Gave message below and she states she knows it is not covid and does not want to get tested. Avree states that it is just her sinuses messing with her.

## 2020-10-03 NOTE — Telephone Encounter (Signed)
Patient is calling in, she had an appointment with [facility] recently for sinus issues. She said she is using everything Dr.Wolfe recommended but still has no relief with her sinuses and a bad cough.

## 2020-10-03 NOTE — Telephone Encounter (Signed)
Let her know I sent in augmentin for her to take for sinus infection. Also I would get tested for covid.  Dr. Rogers Blocker

## 2020-10-19 ENCOUNTER — Telehealth: Payer: Self-pay

## 2020-10-19 ENCOUNTER — Other Ambulatory Visit: Payer: Self-pay | Admitting: Family Medicine

## 2020-10-19 ENCOUNTER — Other Ambulatory Visit: Payer: Self-pay

## 2020-10-19 NOTE — Telephone Encounter (Signed)
..   LAST APPOINTMENT DATE: 09/28/2020   NEXT APPOINTMENT DATE:@2 /07/2021  MEDICATION:SYMBICORT 80-4.5 MCG/ACT inhaler

## 2020-10-19 NOTE — Telephone Encounter (Signed)
Refill sent to pharmacy.   

## 2020-10-28 IMAGING — DX PORTABLE CHEST - 1 VIEW
1 series · 1 of 1 positions shown · non-contrast
Comparison: Chest x-ray dated March 15, 2018.

CLINICAL DATA: Chest pain.

EXAM:
PORTABLE CHEST 1 VIEW

[chest ap]
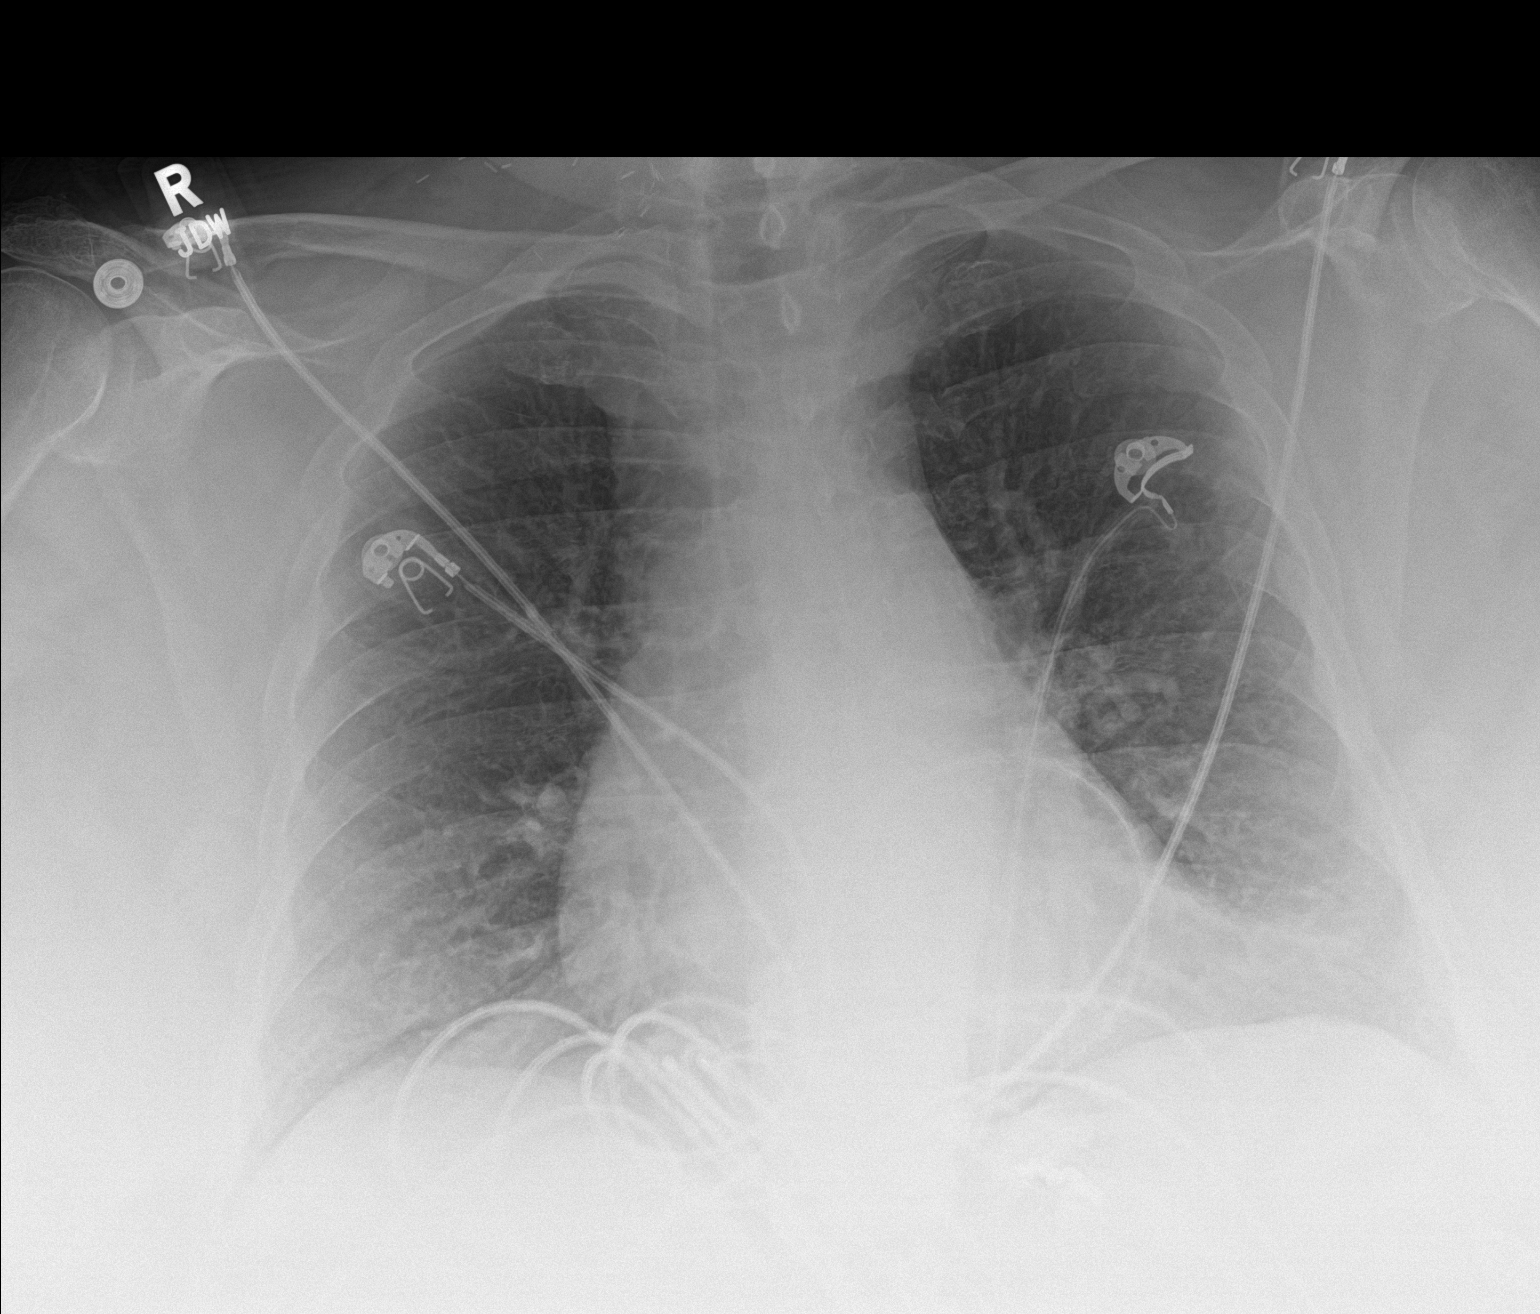

[1 of 1 positions shown; findings below may reference images not displayed]

FINDINGS: The heart remains at the upper limits of normal in size. Normal
mediastinal contours. Normal pulmonary vascularity. No focal
consolidation, pleural effusion, or pneumothorax. No acute osseous
abnormality. Unchanged surgical clips in the right neck.
IMPRESSION: No active disease.

## 2020-11-02 ENCOUNTER — Ambulatory Visit: Payer: Medicare HMO | Admitting: Family Medicine

## 2020-11-08 ENCOUNTER — Other Ambulatory Visit: Payer: Self-pay

## 2020-11-08 ENCOUNTER — Telehealth: Payer: Self-pay | Admitting: Family Medicine

## 2020-11-08 ENCOUNTER — Encounter: Payer: Self-pay | Admitting: Family Medicine

## 2020-11-08 ENCOUNTER — Ambulatory Visit (INDEPENDENT_AMBULATORY_CARE_PROVIDER_SITE_OTHER): Payer: Medicare HMO | Admitting: Family Medicine

## 2020-11-08 VITALS — BP 124/70 | HR 81 | Temp 97.7°F | Ht 65.0 in | Wt 263.4 lb

## 2020-11-08 DIAGNOSIS — N183 Chronic kidney disease, stage 3 unspecified: Secondary | ICD-10-CM | POA: Diagnosis not present

## 2020-11-08 DIAGNOSIS — E118 Type 2 diabetes mellitus with unspecified complications: Secondary | ICD-10-CM | POA: Diagnosis not present

## 2020-11-08 DIAGNOSIS — D751 Secondary polycythemia: Secondary | ICD-10-CM

## 2020-11-08 DIAGNOSIS — E039 Hypothyroidism, unspecified: Secondary | ICD-10-CM

## 2020-11-08 LAB — CBC WITH DIFFERENTIAL/PLATELET
Basophils Absolute: 0.1 10*3/uL (ref 0.0–0.1)
Basophils Relative: 0.6 % (ref 0.0–3.0)
Eosinophils Absolute: 0.1 10*3/uL (ref 0.0–0.7)
Eosinophils Relative: 1.6 % (ref 0.0–5.0)
HCT: 52.3 % — ABNORMAL HIGH (ref 36.0–46.0)
Hemoglobin: 17.4 g/dL — ABNORMAL HIGH (ref 12.0–15.0)
Lymphocytes Relative: 24.7 % (ref 12.0–46.0)
Lymphs Abs: 2 10*3/uL (ref 0.7–4.0)
MCHC: 33.3 g/dL (ref 30.0–36.0)
MCV: 92.4 fl (ref 78.0–100.0)
Monocytes Absolute: 0.5 10*3/uL (ref 0.1–1.0)
Monocytes Relative: 6.7 % (ref 3.0–12.0)
Neutro Abs: 5.4 10*3/uL (ref 1.4–7.7)
Neutrophils Relative %: 66.4 % (ref 43.0–77.0)
Platelets: 179 10*3/uL (ref 150.0–400.0)
RBC: 5.66 Mil/uL — ABNORMAL HIGH (ref 3.87–5.11)
RDW: 15.2 % (ref 11.5–15.5)
WBC: 8.1 10*3/uL (ref 4.0–10.5)

## 2020-11-08 LAB — COMPREHENSIVE METABOLIC PANEL
ALT: 12 U/L (ref 0–35)
AST: 13 U/L (ref 0–37)
Albumin: 4 g/dL (ref 3.5–5.2)
Alkaline Phosphatase: 98 U/L (ref 39–117)
BUN: 19 mg/dL (ref 6–23)
CO2: 32 mEq/L (ref 19–32)
Calcium: 9.8 mg/dL (ref 8.4–10.5)
Chloride: 101 mEq/L (ref 96–112)
Creatinine, Ser: 1.32 mg/dL — ABNORMAL HIGH (ref 0.40–1.20)
GFR: 41.81 mL/min — ABNORMAL LOW (ref >60.00)
Glucose, Bld: 153 mg/dL — ABNORMAL HIGH (ref 70–99)
Potassium: 4.1 mEq/L (ref 3.5–5.1)
Sodium: 138 mEq/L (ref 135–145)
Total Bilirubin: 0.9 mg/dL (ref 0.2–1.2)
Total Protein: 7.4 g/dL (ref 6.0–8.3)

## 2020-11-08 LAB — MICROALBUMIN / CREATININE URINE RATIO
Creatinine,U: 159.5 mg/dL
Microalb Creat Ratio: 0.4 mg/g (ref 0.0–30.0)
Microalb, Ur: <0.7 mg/dL (ref 0.0–1.9)

## 2020-11-08 LAB — T4, FREE: Free T4: 1.12 ng/dL (ref 0.60–1.60)

## 2020-11-08 LAB — TSH: TSH: 1.02 u[IU]/mL (ref 0.35–4.50)

## 2020-11-08 LAB — HEMOGLOBIN A1C: Hgb A1c MFr Bld: 6.6 % — ABNORMAL HIGH (ref 4.6–6.5)

## 2020-11-08 NOTE — Progress Notes (Signed)
Patient: [name] [name] MRN: [id number] DOB: [birth year] PCP: Orma Flaming, MD     Subjective:  Chief Complaint  Patient presents with  . Diabetes  . Chronic Kidney Disease  . polycythemia    HPI: The patient is a 70 y.o. female who presents today for DM, CKD, polycythemia f/u.   Diabetes type 2 -on tradjenta 5mg  daily and invokana 150mg . I decreased her invokana in November due to GFR of 37.  Next step would be insulin as she can not have metformin and could not tolerate the GLP1 drugs both injectable and oral. Fasting sugars have been 70-140. Post prandial is 100 after dinner. No hypoglycemia.   CKD -has been trending upward since she transferred care to me from Dr. Juleen China. She has not taken any NSAIDs and metformin was stopped when renal function began to rise. Last normal renal function was in 09/2018. Her last creatinine was 1.32 on lab visit in December of 2021. I stopped her spironolactone which may have also helped.  she takes no NSAIDS.   Polycythemia Recommended phlebotmy on last cbc. She has not done this as she was waiting on me to send her to the cancer center. Will recheck today.   She states the back of her lung hurt. She hasn't had covid that she knows of. She doesn't really have shortness of breath or pain with breathing. She has pain in her back with twisting and moving.   Hypothyroidism She has had some fluctuating numbers, but last check they wre normal; however, she states when her sleeps is off it can be her thyroid and she has had some issues over the last week or two.   Review of Systems  Constitutional: Negative for chills, fatigue and fever.  HENT: Negative for dental problem, ear pain, hearing loss and trouble swallowing.   Eyes: Negative for visual disturbance.  Respiratory: Negative for cough, chest tightness and shortness of breath.   Cardiovascular: Negative for chest pain, palpitations and leg swelling.  Gastrointestinal: Negative for abdominal  pain, blood in stool, diarrhea and nausea.  Endocrine: Negative for cold intolerance, polydipsia, polyphagia and polyuria.  Genitourinary: Negative for dysuria and hematuria.  Musculoskeletal: Positive for back pain. Negative for arthralgias.  Skin: Negative for rash.  Neurological: Negative for dizziness and headaches.  Psychiatric/Behavioral: Negative for dysphoric mood and sleep disturbance. The patient is not nervous/anxious.     Allergies Patient is allergic to clindamycin/lincomycin, codeine, cortisone, dilaudid [hydromorphone hcl], iodides, reglan [metoclopramide], tramadol, and tape.  Past Medical History Patient  has a past medical history of Bronchitis, asthmatic, Coronary artery disease, Diabetes mellitus without complication ([place]), Hyperlipidemia, Hypertensive heart disease (04/16/2016), Hypothyroidism, MI, old, Morbid obesity ([place]) (04/16/2016), S/P coronary artery stent placement (04/16/2016), and Tobacco abuse (04/16/2016).  Surgical History Patient  has a past surgical history that includes Coronary stent placement; Wrist surgery; Coronary angioplasty (02/2008); Abdominal hysterectomy; Cholecystectomy; Knee arthroscopy; Ankle surgery (Right); Rotator cuff repair (Bilateral); and Thoracic outlet surgery.  Family History Pateint's family history includes Cancer in her mother; Diabetes in her son; Hypertension in her son.  Social History Patient  reports that she has been smoking. She has a 15.50 pack-year smoking history. She has never used smokeless tobacco. She reports that she does not drink alcohol and does not use drugs.    Objective: Vitals:   11/08/20 1125  BP: 124/70  Pulse: 81  Temp: 97.7 F (36.5 C)  TempSrc: Temporal  SpO2: 97%  Weight: 263 lb 6.4 oz (119.5 kg)  Height:  5\' 5"  (1.651 m)    Body mass index is 43.83 kg/m.  Physical Exam Vitals reviewed.  Constitutional:      Appearance: Normal appearance. She is well-developed and well-nourished. She is  obese.  HENT:     Head: Normocephalic and atraumatic.     Right Ear: External ear normal.     Left Ear: External ear normal.     Mouth/Throat:     Mouth: Oropharynx is clear and moist.  Eyes:     Extraocular Movements: EOM normal.  Neck:     Thyroid: No thyromegaly.  Cardiovascular:     Rate and Rhythm: Normal rate and regular rhythm.     Pulses: Intact distal pulses.     Heart sounds: Normal heart sounds. No murmur heard.   Pulmonary:     Effort: Pulmonary effort is normal.     Breath sounds: Normal breath sounds.  Abdominal:     General: Bowel sounds are normal. There is no distension.     Palpations: Abdomen is soft.     Tenderness: There is no abdominal tenderness.  Musculoskeletal:     Cervical back: Normal range of motion and neck supple.  Lymphadenopathy:     Cervical: No cervical adenopathy.  Skin:    General: Skin is warm and dry.     Capillary Refill: Capillary refill takes less than 2 seconds.     Findings: No rash.  Neurological:     General: No focal deficit present.     Mental Status: She is alert and oriented to person, place, and time.     Cranial Nerves: No cranial nerve deficit.     Coordination: Coordination normal.     Deep Tendon Reflexes: Reflexes normal.  Psychiatric:        Mood and Affect: Mood and affect and mood normal.        Behavior: Behavior normal.        Diabetic Foot Exam - Simple   Simple Foot Form Diabetic Foot exam was performed with the following findings: Yes 11/08/2020 11:52 AM  Visual Inspection Sensation Testing Pulse Check Comments Dry flaky skin     exam  Assessment/plan: 1. DM (diabetes mellitus) with complications ([place]), -foot exam normal -needs eye exam.  -utd on vaccines -checking up/uc and a1c today. Fasting sugars to goal and she is even doing better with me decreasing dose of her ivokana.  -if a1c to goal, f/u in 6 months.  - Comprehensive metabolic panel - Hemoglobin A1c - Microalbumin / creatinine  urine ratio  2. Polycythemia, secondary, followed by Heme, therapeutic phlebotomy -schedule phlebotomy if needed.  - CBC with Differential/Platelet  3. Stage 3 chronic kidney disease, unspecified whether stage 3a or 3b CKD ([place])   4. Hypothyroidism (acquired), stable on Levothyroxine  - TSH - T4, free  This visit occurred during the SARS-CoV-2 public health emergency.  Safety protocols were in place, including screening questions prior to the visit, additional usage of staff PPE, and extensive cleaning of exam room while observing appropriate contact time as indicated for disinfecting solutions.    Return in about 6 months (around 05/08/2021) for diabetes/htn/thyroid.    Orma Flaming, MD [place]   11/08/2020

## 2020-11-08 NOTE — Patient Instructions (Signed)
Routine labs!  Back is your lat muscles. Try some stretching, watch your posture and can use biofreeze or voltaren on it as well as heating pad.    Foot exam was normal.   Good seeing you! Depending on a1c 3-6 months !  Dr. Rogers Blocker

## 2020-11-08 NOTE — Telephone Encounter (Signed)
Can you please call cancer center and see how I can arrange outpatient therapeutic phlebotomy for a patient who has polycythemia vera. She needs this done.   Thanks! Dr. Rogers Blocker

## 2020-11-09 NOTE — Telephone Encounter (Signed)
Lvm with Nurse at [facility] to call the office back for assistance with message below.

## 2020-11-12 ENCOUNTER — Telehealth: Payer: Self-pay

## 2020-11-12 NOTE — Telephone Encounter (Signed)
Spoke with pt to give lab result message.

## 2020-11-12 NOTE — Telephone Encounter (Signed)
I spoke with [name] [name] at the [facility]. She says that they only take pt of current Oncologist just because volume is so high. This can be done at the [facility] or infusion center at [facility]. [facility]).

## 2020-11-12 NOTE — Telephone Encounter (Signed)
Patient is returning a call from [facility] [place].

## 2020-11-15 NOTE — Telephone Encounter (Signed)
I gave pt message below. She voiced understanding.  

## 2020-11-15 NOTE — Telephone Encounter (Signed)
Let her know I put in order and infusion center should be calling her.  Thanks1 Aw

## 2020-11-15 NOTE — Addendum Note (Signed)
Addended by: Orma Flaming on: 11/15/2020 03:25 PM   Modules accepted: Orders

## 2020-11-20 ENCOUNTER — Other Ambulatory Visit: Payer: Self-pay | Admitting: Family Medicine

## 2020-11-20 ENCOUNTER — Telehealth: Payer: Self-pay

## 2020-11-20 DIAGNOSIS — J4541 Moderate persistent asthma with (acute) exacerbation: Secondary | ICD-10-CM

## 2020-11-20 NOTE — Telephone Encounter (Signed)
MEDICATION: ipratropium (ATROVENT) 0.06 % nasal spray  PHARMACY: [facility]  Comments:   **Let patient know to contact pharmacy at the end of the day to make sure medication is ready. **  ** Please notify patient to allow 48-72 hours to process**  **Encourage patient to contact the pharmacy for refills or they can request refills through [facility]**

## 2020-11-20 NOTE — Telephone Encounter (Signed)
Sent to pharmacy 

## 2020-11-27 ENCOUNTER — Telehealth: Payer: Self-pay

## 2020-11-27 ENCOUNTER — Other Ambulatory Visit: Payer: Self-pay | Admitting: *Deleted

## 2020-11-27 ENCOUNTER — Other Ambulatory Visit: Payer: Self-pay | Admitting: Family Medicine

## 2020-11-27 DIAGNOSIS — D751 Secondary polycythemia: Secondary | ICD-10-CM

## 2020-11-27 NOTE — Telephone Encounter (Signed)
Pt is requesting a call from [place]. She states its in regards to something with the [facility]

## 2020-11-27 NOTE — Progress Notes (Signed)
Opened in error

## 2020-11-27 NOTE — Telephone Encounter (Signed)
Melitta let her know that I thought that her phebotomy was set up and they called saying they can't do it. I put in a referral to hematology as it will be easier for them to do since I can't get this done.  Orma Flaming, MD [place]

## 2020-11-28 ENCOUNTER — Telehealth: Payer: Self-pay | Admitting: *Deleted

## 2020-11-28 ENCOUNTER — Telehealth: Payer: Self-pay

## 2020-11-28 NOTE — Telephone Encounter (Signed)
Katharine Look from the [facility] called about the patient and wanted to talk about the referral that was sent. [phone] call back to speak to [facility].

## 2020-11-28 NOTE — Telephone Encounter (Signed)
Patient called: she said her hgb is up again and her doctor wants her to come here and have a pint of blood taken again.  Received referral from [facility] for patient to have therapeutic phlebotomy. Dr.Kale informed of both messages. Dr. Grier Mitts response -  Her polycythemia is secondary due to use of Invokana, dehydration, smoking and likely sleep apnea. These need to be addressed with the primary care physician. No indication for therapeutic phlebotomy for secondary polycythemia at this time. She can choose to donate blood 2-3 times a year to help keep her hematocrit less than 55. Continue to address secondary cardiovascular risk factors with primary care physician. Please inform patient and PCP.  Contacted patient with this information. She verbalized understanding and states she has her blood checked every three months and will ask about her hgb and hct at that time.  Contacted Dr. Shelby Mattocks office and gave information to [facility], Dr. Shelby Mattocks office assistant. She will give information to Dr. Rogers Blocker.

## 2020-11-28 NOTE — Telephone Encounter (Signed)
Pt was given message below. Pt says she has been there a couple times and does not understand why they can't do it.  I explained that hematology will give her a call within the next week or two to scheduler an appointment. Pt voiced understanding.

## 2020-11-28 NOTE — Telephone Encounter (Signed)
I spoke with Katharine Look [facility]), in Oncology. She explained Dr. Grier Mitts  response and detail, which was also noted in the patient's chart 11/28/2020. Response was also printed out and given to Dr. Rogers Blocker for review. Pt was also contacted by Katharine Look in Oncology.

## 2020-12-10 ENCOUNTER — Telehealth: Payer: Self-pay

## 2020-12-10 DIAGNOSIS — F419 Anxiety disorder, unspecified: Secondary | ICD-10-CM

## 2020-12-10 NOTE — Telephone Encounter (Signed)
MEDICATION: xanax 0.5 MG  PHARMACY: [facility]  Comments:   **Let patient know to contact pharmacy at the end of the day to make sure medication is ready. **  ** Please notify patient to allow 48-72 hours to process**  **Encourage patient to contact the pharmacy for refills or they can request refills through [facility]**

## 2020-12-11 ENCOUNTER — Telehealth: Payer: Self-pay

## 2020-12-11 NOTE — Telephone Encounter (Signed)
Rx Request 

## 2020-12-11 NOTE — Telephone Encounter (Signed)
Pt states she has gout in her right foot. She states she has had this before years ago. Pt is wanting to know if Dr. Rogers Blocker can call her in something for it.

## 2020-12-12 MED ORDER — ALPRAZOLAM 0.5 MG PO TABS
0.5000 mg | ORAL_TABLET | Freq: Two times a day (BID) | ORAL | 3 refills | Status: DC | PRN
Start: 2020-12-12 — End: 2021-05-24

## 2020-12-12 NOTE — Telephone Encounter (Signed)
I typically like to see in person, especially if it has been years so not to miss a septic joint.  Orma Flaming, MD [place]

## 2020-12-12 NOTE — Telephone Encounter (Signed)
I spoke with the pt to give message below. She says that the pain has subsided. If it starts again, she will follow up in the office.

## 2020-12-19 ENCOUNTER — Other Ambulatory Visit: Payer: Self-pay | Admitting: Family Medicine

## 2021-01-15 ENCOUNTER — Other Ambulatory Visit: Payer: Self-pay | Admitting: Family Medicine

## 2021-01-15 DIAGNOSIS — E1169 Type 2 diabetes mellitus with other specified complication: Secondary | ICD-10-CM

## 2021-01-21 ENCOUNTER — Ambulatory Visit (INDEPENDENT_AMBULATORY_CARE_PROVIDER_SITE_OTHER): Payer: Medicare HMO

## 2021-01-21 DIAGNOSIS — Z0000 Encounter for general adult medical examination without abnormal findings: Secondary | ICD-10-CM | POA: Diagnosis not present

## 2021-01-21 NOTE — Progress Notes (Signed)
Subjective:   Raeleen CHARLINA DWIGHT is a 70 y.o. female who presents for Medicare Annual (Subsequent) preventive examination.  Review of Systems     Cardiac Risk Factors include: advanced age (>48men, >65 women);diabetes mellitus;hypertension;female gender;dyslipidemia;obesity (BMI >30kg/m2)     Objective:    There were no vitals filed for this visit. There is no height or weight on file to calculate BMI.  Advanced Directives 01/21/2021 11/21/2019 02/15/2019 02/15/2019 11/25/2017 02/29/2016  Does Patient Have a Medical Advance Directive? No No No No No No  Does patient want to make changes to medical advance directive? No - Patient declined - - - - -  Would patient like information on creating a medical advance directive? - Yes (MAU/Ambulatory/Procedural Areas - Information given) No - Patient declined - No - Patient declined -    Current Medications (verified) Outpatient Encounter Medications as of 01/21/2021  Medication Sig  . Accu-Chek FastClix Lancets MISC USE TO TEST UP TO FOUR TIMES DAILY AS DIRECTED  . albuterol (ACCUNEB) 1.25 MG/3ML nebulizer solution Take 3 mLs (1.25 mg total) by nebulization 3 (three) times daily as needed for wheezing.  Marland Kitchen albuterol (PROAIR HFA) 108 (90 Base) MCG/ACT inhaler Inhale 2 puffs into the lungs every 6 (six) hours as needed for wheezing or shortness of breath.  . ALPRAZolam (XANAX) 0.5 MG tablet Take 1 tablet (0.5 mg total) by mouth 2 (two) times daily as needed for anxiety.  Marland Kitchen aspirin 81 MG tablet Take 81 mg by mouth daily.  Marland Kitchen atorvastatin (LIPITOR) 10 MG tablet TAKE 1 TABLET(10 MG) BY MOUTH DAILY  . blood glucose meter kit and supplies KIT Dispense based on patient and insurance preference. Use up to four times daily as directed. DX E11.8  . Calcium Carb-Cholecalciferol (CALCIUM 500 + D3 PO) Take by mouth.  . canagliflozin (INVOKANA) 100 MG TABS tablet Take 150 mg by mouth daily before breakfast. Taking dose $RemoveBef'150mg'bicLDhftkQ$   . furosemide (LASIX) 20 MG tablet TAKE 1 TABLET(20  MG) BY MOUTH DAILY (Patient taking differently: Taking 3 times a week)  . glucose blood test strip Use as instructed  . ipratropium (ATROVENT) 0.06 % nasal spray USE 2 SPRAYS IN EACH NOSTRIL FOUR TIMES DAILY AS DIRECTED  . ipratropium-albuterol (DUONEB) 0.5-2.5 (3) MG/3ML SOLN Take 3 mLs by nebulization every 6 (six) hours as needed.  . isosorbide mononitrate (IMDUR) 60 MG 24 hr tablet Take 1 tablet (60 mg total) by mouth daily.  Marland Kitchen levothyroxine (SYNTHROID) 100 MCG tablet Take 1 tablet (100 mcg total) by mouth daily.  Marland Kitchen linagliptin (TRADJENTA) 5 MG TABS tablet TAKE 1 TABLET(5 MG) BY MOUTH DAILY  . Menthol, Topical Analgesic, (BIOFREEZE) 4 % GEL Apply topically.  . pantoprazole (PROTONIX) 40 MG tablet TAKE 1 TABLET(40 MG) BY MOUTH DAILY  . SYMBICORT 80-4.5 MCG/ACT inhaler INHALE 2 PUFFS INTO THE LUNGS TWICE DAILY  . vitamin B-12 (CYANOCOBALAMIN) 1000 MCG tablet Take 1,000 mcg by mouth daily.  . Diclofenac Sodium 2 % SOLN Use times daily as needed. (Patient not taking: Reported on 01/21/2021)  . [DISCONTINUED] potassium chloride SA (KLOR-CON) 10 MEQ tablet Take 1 tablet (10 mEq total) by mouth daily. (Patient not taking: Reported on 01/21/2021)  . [DISCONTINUED] vitamin C (ASCORBIC ACID) 500 MG tablet Take 500 mg by mouth daily. (Patient not taking: Reported on 01/21/2021)   No facility-administered encounter medications on file as of 01/21/2021.    Allergies (verified) Clindamycin/lincomycin, Codeine, Cortisone, Dilaudid [hydromorphone hcl], Iodides, Reglan [metoclopramide], Tramadol, and Tape   History: Past Medical History:  Diagnosis Date  . Bronchitis, asthmatic   . Coronary artery disease    BMS to LAD 02/2008  . Diabetes mellitus without complication ([place])   . Hyperlipidemia   . Hypertensive heart disease 04/16/2016  . Hypothyroidism   . MI, old   . Morbid obesity ([place]) 04/16/2016  . S/P coronary artery stent placement 04/16/2016   BMS to LAD 2009  . Tobacco abuse 04/16/2016   Past  Surgical History:  Procedure Laterality Date  . ABDOMINAL HYSTERECTOMY    . ANKLE SURGERY Right   . CHOLECYSTECTOMY    . CORONARY ANGIOPLASTY  02/2008   bare metal stent to LAD   . CORONARY STENT PLACEMENT    . KNEE ARTHROSCOPY    . ROTATOR CUFF REPAIR Bilateral   . THORACIC OUTLET SURGERY    . WRIST SURGERY     Family History  Problem Relation Age of Onset  . Cancer Mother   . Diabetes Son   . Hypertension Son    Social History   Socioeconomic History  . Marital status: Married    Spouse name: Not on file  . Number of children: Not on file  . Years of education: Not on file  . Highest education level: Not on file  Occupational History  . Occupation: Disabled   Tobacco Use  . Smoking status: Light Tobacco Smoker    Packs/day: 0.50    Years: 31.00    Pack years: 15.50  . Smokeless tobacco: Never Used  Vaping Use  . Vaping Use: Never used  Substance and Sexual Activity  . Alcohol use: No  . Drug use: No  . Sexual activity: Yes    Partners: Male  Other Topics Concern  . Not on file  Social History Narrative   Originally from [place]    Social Determinants of Health   Financial Resource Strain: Low Risk   . Difficulty of Paying Living Expenses: Not hard at all  Food Insecurity: No Food Insecurity  . Worried About Charity fundraiser in the Last Year: Never true  . Ran Out of Food in the Last Year: Never true  Transportation Needs: No Transportation Needs  . Lack of Transportation (Medical): No  . Lack of Transportation (Non-Medical): No  Physical Activity: Inactive  . Days of Exercise per Week: 0 days  . Minutes of Exercise per Session: 0 min  Stress: No Stress Concern Present  . Feeling of Stress : Not at all  Social Connections: Moderately Isolated  . Frequency of Communication with Friends and Family: More than three times a week  . Frequency of Social Gatherings with Friends and Family: Once a week  . Attends Religious Services:  Never  . Active Member of Clubs or Organizations: No  . Attends Archivist Meetings: Never  . Marital Status: Married    Tobacco Counseling Ready to quit: Not Answered Counseling given: Not Answered   Clinical Intake:  Pre-visit preparation completed: Yes  Pain : No/denies pain     BMI - recorded: 43.83 Nutritional Status: BMI > 30  Obese Nutritional Risks: None Diabetes: Yes CBG done?: No Did pt. bring in CBG monitor from home?: No  How often do you need to have someone help you when you read instructions, pamphlets, or other written materials from your doctor or pharmacy?: 1 - Never  Diabetic? Nutrition Risk Assessment:  Has the patient had any N/V/D within the last 2 months?  No  Does the  patient have any non-healing wounds?  No  Has the patient had any unintentional weight loss or weight gain?  No   Diabetes:  Is the patient diabetic?  Yes  If diabetic, was a CBG obtained today?  No  Did the patient bring in their glucometer from home?  No  How often do you monitor your CBG's? Daily .   Financial Strains and Diabetes Management:  Are you having any financial strains with the device, your supplies or your medication? No .  Does the patient want to be seen by Chronic Care Management for management of their diabetes?  No  Would the patient like to be referred to a Nutritionist or for Diabetic Management?  No   Diabetic Exams:  Diabetic Eye Exam: Overdue for diabetic eye exam. Pt has been advised about the importance in completing this exam. Patient advised to call and schedule an eye exam. Diabetic Foot Exam: Completed 11/08/20   Interpreter Needed?: No  Information entered by :: Charlott Rakes, LPN   Activities of Daily Living In your present state of health, do you have any difficulty performing the following activities: 01/21/2021  Hearing? Y  Comment right ear mild loss  Vision? Y  Comment blurry vision at times  Difficulty concentrating or  making decisions? Y  Comment forget at times  Walking or climbing stairs? Y  Dressing or bathing? N  Doing errands, shopping? N  Preparing Food and eating ? Y  Comment husband prepares meals  Using the Toilet? N  In the past six months, have you accidently leaked urine? Y  Comment urgency at times  Do you have problems with loss of bowel control? N  Managing your Medications? N  Managing your Finances? N  Housekeeping or managing your Housekeeping? N  Some recent data might be hidden    Patient Care Team: Orma Flaming, MD as PCP - General (Family Medicine) Skeet Latch, MD as PCP - Cardiology (Cardiology) Lebron Conners Marinell Blight, MD (Inactive) as Consulting Physician (Hematology and Oncology) Almedia Balls, MD as Consulting Physician (Orthopedic Surgery) Verner Chol, MD as Consulting Physician (Sports Medicine)  Indicate any recent Medical Services you may have received from other than [facility] providers in the past year (date may be approximate).     Assessment:   This is a routine wellness examination for [place].  Hearing/Vision screen  Hearing Screening   '125Hz'$  $[place]'250Hz'[place]$'500Hz'$'1000Hz'$'2000Hz'$'3000Hz'$'4000Hz'$'6000Hz'$'8000Hz'$   Right ear:           Left ear:           Comments: Pt stated that her right ear mild loss   Vision Screening Comments: Pt follows up with [facility] for annual eye exams   Dietary issues and exercise activities discussed: Current Exercise Habits: The patient does not participate in regular exercise at present  Goals Addressed            This Visit's Progress   . Patient Stated       Lose weight       Depression Screen PHQ 2/9 Scores 01/21/2021 08/10/2020 07/02/2020 11/21/2019 08/10/2019 05/04/2019 10/18/2018  PHQ - 2 Score 0 0 0 0 0 1 1  PHQ- 9 Score - - - - - 1 2    Fall Risk Fall Risk  01/21/2021 09/28/2020 08/10/2020 07/30/2020 07/02/2020  Falls in the past year? 0 0 0 0 0  Number falls in past yr: 0 - - - -  Injury with Fall? 0 - - - -  Comment - -  - - -  Risk for fall due to : Impaired balance/gait;Impaired vision No Fall Risks No Fall Risks No Fall Risks No Fall Risks  Follow up Falls prevention discussed - - - -    FALL RISK PREVENTION PERTAINING TO THE HOME:  Any stairs in or around the home? Yes  If so, are there any without handrails? No  Home free of loose throw rugs in walkways, pet beds, electrical cords, etc? Yes  Adequate lighting in your home to reduce risk of falls? Yes   ASSISTIVE DEVICES UTILIZED TO PREVENT FALLS:  Life alert? No  Use of a cane, walker or w/c? Yes  Grab bars in the bathroom? Yes  Shower chair or bench in shower? No  Elevated toilet seat or a handicapped toilet? No   TIMED UP AND GO:  Was the test performed? No .      Cognitive Function:     6CIT Screen 01/21/2021 11/21/2019  What Year? 0 points 0 points  What month? 0 points 0 points  What time? - 0 points  Count back from 20 0 points 0 points  Months in reverse 0 points 0 points  Repeat phrase 0 points 0 points  Total Score - 0    Immunizations Immunization History  Administered Date(s) Administered  . Fluad Quad(high Dose 65+) 06/01/2019, 07/30/2020  . Influenza, High Dose Seasonal PF 07/19/2018  . Influenza,inj,Quad PF,6+ Mos 07/24/2017  . Moderna Sars-Covid-2 Vaccination 02/02/2020, 03/05/2020  . Pneumococcal Polysaccharide-23 12/19/2019  . Tdap 06/22/2014    TDAP status: Up to date  Flu Vaccine status: Up to date  Pneumococcal vaccine status: Due, Education has been provided regarding the importance of this vaccine. Advised may receive this vaccine at local pharmacy or Health Dept. Aware to provide a copy of the vaccination record if obtained from local pharmacy or Health Dept. Verbalized acceptance and understanding.  Covid-19 vaccine status: Completed vaccines  Qualifies for Shingles Vaccine? Yes   Zostavax completed No   Shingrix Completed?: No.    Education has been provided regarding the importance of this  vaccine. Patient has been advised to call insurance company to determine out of pocket expense if they have not yet received this vaccine. Advised may also receive vaccine at local pharmacy or Health Dept. Verbalized acceptance and understanding.  Screening Tests Health Maintenance  Topic Date Due  . OPHTHALMOLOGY EXAM  10/02/2018  . COVID-19 Vaccine (3 - Booster for Moderna series) 09/04/2020  . PNA vac Low Risk Adult (2 of 2 - PCV13) 12/18/2020  . INFLUENZA VACCINE  04/22/2021  . HEMOGLOBIN A1C  05/08/2021  . FOOT EXAM  11/08/2021  . URINE MICROALBUMIN  11/08/2021  . MAMMOGRAM  01/19/2022  . TETANUS/TDAP  06/22/2024  . DEXA SCAN  01/19/2025  . COLONOSCOPY (Pts 45-41yrs Insurance coverage will need to be confirmed)  11/07/2026  . Hepatitis C Screening  Completed  . HPV VACCINES  Aged Out    Health Maintenance  Health Maintenance Due  Topic Date Due  . OPHTHALMOLOGY EXAM  10/02/2018  . COVID-19 Vaccine (3 - Booster for Moderna series) 09/04/2020  . PNA vac Low Risk Adult (2 of 2 - PCV13) 12/18/2020    Colorectal cancer screening: Type of screening: Colonoscopy. Completed 11/07/16. Repeat every 10 years  Mammogram status: Completed 01/20/20. Repeat every year  Bone Density status: Completed 01/20/20. Results reflect: Bone density results: OSTEOPENIA. Repeat every 5 years.   Additional Screening:  Hepatitis C Screening:  Completed 01/28/17  Vision Screening: Recommended annual ophthalmology exams for early detection of glaucoma and other disorders of the eye. Is the patient up to date with their annual eye exam?  Yes  Who is the provider or what is the name of the office in which the patient attends annual eye exams? [facility]  If pt is not established with a provider, would they like to be referred to a provider to establish care? No .   Dental Screening: Recommended annual dental exams for proper oral hygiene  Community Resource Referral / Chronic Care Management: CRR  required this visit?  No   CCM required this visit?  No      Plan:     I have personally reviewed and noted the following in the patient's chart:   . Medical and social history . Use of alcohol, tobacco or illicit drugs  . Current medications and supplements including opioid prescriptions.  . Functional ability and status . Nutritional status . Physical activity . Advanced directives . List of other physicians . Hospitalizations, surgeries, and ER visits in previous 12 months . Vitals . Screenings to include cognitive, depression, and falls . Referrals and appointments  In addition, I have reviewed and discussed with patient certain preventive protocols, quality metrics, and best practice recommendations. A written personalized care plan for preventive services as well as general preventive health recommendations were provided to patient.     Willette Brace, LPN   10/25/3433   Nurse Notes: None

## 2021-01-21 NOTE — Patient Instructions (Addendum)
[name] [name] , Thank you for taking time to come for your Medicare Wellness Visit. I appreciate your ongoing commitment to your health goals. Please review the following plan we discussed and let me know if I can assist you in the future.   Screening recommendations/referrals: Colonoscopy: Done 11/07/16 Mammogram: Done 01/20/20 Bone Density: Done 01/20/20 Recommended yearly ophthalmology/optometry visit for glaucoma screening and checkup Recommended yearly dental visit for hygiene and checkup  Vaccinations: Influenza vaccine: Up to date Pneumococcal vaccine: Due and discussed Tdap vaccine: Up to date Shingles vaccine: Shingrix discussed. Please contact your pharmacy for coverage information.    Covid-19:Completed 5/13 & 03/05/20  Advanced directives: Advance directive discussed with you today. Even though you declined this today please call our office should you change your mind and we can give you the proper paperwork for you to fill out.  Conditions/risks identified: Lose weight   Next appointment: Follow up in one year for your annual wellness visit    Preventive Care 65 Years and Older, Female Preventive care refers to lifestyle choices and visits with your health care provider that can promote health and wellness. What does preventive care include?  A yearly physical exam. This is also called an annual well check.  Dental exams once or twice a year.  Routine eye exams. Ask your health care provider how often you should have your eyes checked.  Personal lifestyle choices, including:  Daily care of your teeth and gums.  Regular physical activity.  Eating a healthy diet.  Avoiding tobacco and drug use.  Limiting alcohol use.  Practicing safe sex.  Taking low-dose aspirin every day.  Taking vitamin and mineral supplements as recommended by your health care provider. What happens during an annual well check? The services and screenings done by your health care provider  during your annual well check will depend on your age, overall health, lifestyle risk factors, and family history of disease. Counseling  Your health care provider may ask you questions about your:  Alcohol use.  Tobacco use.  Drug use.  Emotional well-being.  Home and relationship well-being.  Sexual activity.  Eating habits.  History of falls.  Memory and ability to understand (cognition).  Work and work Statistician.  Reproductive health. Screening  You may have the following tests or measurements:  Height, weight, and BMI.  Blood pressure.  Lipid and cholesterol levels. These may be checked every 5 years, or more frequently if you are over 64 years old.  Skin check.  Lung cancer screening. You may have this screening every year starting at age 82 if you have a 30-pack-year history of smoking and currently smoke or have quit within the past 15 years.  Fecal occult blood test (FOBT) of the stool. You may have this test every year starting at age 77.  Flexible sigmoidoscopy or colonoscopy. You may have a sigmoidoscopy every 5 years or a colonoscopy every 10 years starting at age 7.  Hepatitis C blood test.  Hepatitis B blood test.  Sexually transmitted disease (STD) testing.  Diabetes screening. This is done by checking your blood sugar (glucose) after you have not eaten for a while (fasting). You may have this done every 1-3 years.  Bone density scan. This is done to screen for osteoporosis. You may have this done starting at age 20.  Mammogram. This may be done every 1-2 years. Talk to your health care provider about how often you should have regular mammograms. Talk with your health care provider about your  test results, treatment options, and if necessary, the need for more tests. Vaccines  Your health care provider may recommend certain vaccines, such as:  Influenza vaccine. This is recommended every year.  Tetanus, diphtheria, and acellular pertussis  (Tdap, Td) vaccine. You may need a Td booster every 10 years.  Zoster vaccine. You may need this after age 59.  Pneumococcal 13-valent conjugate (PCV13) vaccine. One dose is recommended after age 2.  Pneumococcal polysaccharide (PPSV23) vaccine. One dose is recommended after age 63. Talk to your health care provider about which screenings and vaccines you need and how often you need them. This information is not intended to replace advice given to you by your health care provider. Make sure you discuss any questions you have with your health care provider. Document Released: 10/05/2015 Document Revised: 05/28/2016 Document Reviewed: 07/10/2015 Elsevier Interactive Patient Education  2017 [place] Prevention in the Home Falls can cause injuries. They can happen to people of all ages. There are many things you can do to make your home safe and to help prevent falls. What can I do on the outside of my home?  Regularly fix the edges of walkways and driveways and fix any cracks.  Remove anything that might make you trip as you walk through a door, such as a raised step or threshold.  Trim any bushes or trees on the path to your home.  Use bright outdoor lighting.  Clear any walking paths of anything that might make someone trip, such as rocks or tools.  Regularly check to see if handrails are loose or broken. Make sure that both sides of any steps have handrails.  Any raised decks and porches should have guardrails on the edges.  Have any leaves, snow, or ice cleared regularly.  Use sand or salt on walking paths during winter.  Clean up any spills in your garage right away. This includes oil or grease spills. What can I do in the bathroom?  Use night lights.  Install grab bars by the toilet and in the tub and shower. Do not use towel bars as grab bars.  Use non-skid mats or decals in the tub or shower.  If you need to sit down in the shower, use a plastic, non-slip  stool.  Keep the floor dry. Clean up any water that spills on the floor as soon as it happens.  Remove soap buildup in the tub or shower regularly.  Attach bath mats securely with double-sided non-slip rug tape.  Do not have throw rugs and other things on the floor that can make you trip. What can I do in the bedroom?  Use night lights.  Make sure that you have a light by your bed that is easy to reach.  Do not use any sheets or blankets that are too big for your bed. They should not hang down onto the floor.  Have a firm chair that has side arms. You can use this for support while you get dressed.  Do not have throw rugs and other things on the floor that can make you trip. What can I do in the kitchen?  Clean up any spills right away.  Avoid walking on wet floors.  Keep items that you use a lot in easy-to-reach places.  If you need to reach something above you, use a strong step stool that has a grab bar.  Keep electrical cords out of the way.  Do not use floor polish or wax that  makes floors slippery. If you must use wax, use non-skid floor wax.  Do not have throw rugs and other things on the floor that can make you trip. What can I do with my stairs?  Do not leave any items on the stairs.  Make sure that there are handrails on both sides of the stairs and use them. Fix handrails that are broken or loose. Make sure that handrails are as long as the stairways.  Check any carpeting to make sure that it is firmly attached to the stairs. Fix any carpet that is loose or worn.  Avoid having throw rugs at the top or bottom of the stairs. If you do have throw rugs, attach them to the floor with carpet tape.  Make sure that you have a light switch at the top of the stairs and the bottom of the stairs. If you do not have them, ask someone to add them for you. What else can I do to help prevent falls?  Wear shoes that:  Do not have high heels.  Have rubber bottoms.  Are  comfortable and fit you well.  Are closed at the toe. Do not wear sandals.  If you use a stepladder:  Make sure that it is fully opened. Do not climb a closed stepladder.  Make sure that both sides of the stepladder are locked into place.  Ask someone to hold it for you, if possible.  Clearly mark and make sure that you can see:  Any grab bars or handrails.  First and last steps.  Where the edge of each step is.  Use tools that help you move around (mobility aids) if they are needed. These include:  Canes.  Walkers.  Scooters.  Crutches.  Turn on the lights when you go into a dark area. Replace any light bulbs as soon as they burn out.  Set up your furniture so you have a clear path. Avoid moving your furniture around.  If any of your floors are uneven, fix them.  If there are any pets around you, be aware of where they are.  Review your medicines with your doctor. Some medicines can make you feel dizzy. This can increase your chance of falling. Ask your doctor what other things that you can do to help prevent falls. This information is not intended to replace advice given to you by your health care provider. Make sure you discuss any questions you have with your health care provider. Document Released: 07/05/2009 Document Revised: 02/14/2016 Document Reviewed: 10/13/2014 Elsevier Interactive Patient Education  2017 [employer].

## 2021-01-22 ENCOUNTER — Ambulatory Visit: Payer: Medicare HMO | Admitting: Cardiovascular Disease

## 2021-02-11 ENCOUNTER — Other Ambulatory Visit: Payer: Self-pay | Admitting: Family Medicine

## 2021-02-11 DIAGNOSIS — R600 Localized edema: Secondary | ICD-10-CM

## 2021-02-12 ENCOUNTER — Other Ambulatory Visit: Payer: Self-pay | Admitting: Family Medicine

## 2021-02-12 DIAGNOSIS — J4541 Moderate persistent asthma with (acute) exacerbation: Secondary | ICD-10-CM

## 2021-02-12 NOTE — Progress Notes (Incomplete)
Cardiology Office Note   Date:  02/12/2021   ID:  [name], [name] [birth year], MRN [id number]  PCP:  Orma Flaming, [place]  Cardiologist:   Madelin Rear   No chief complaint on file.    History of Present Illness: [name] [name] is a 70 y.o. female with CAD s/p PCI, COPD, morbid obesity, chronic bronchitis, and hypothyroidism who presents to for follow up.  While living in [place] she had a BMS in the LAD 02/2008.  Since then she has done very well.  She had a 2 day Persantine Cardiolite on 01/07/13 that was negative for ischemia. LVEF was 79%.  She is the primary caretaker of her 60 year old grandson who has ADD.  Her daughter also lives with her.  Her husband also suffers from posttraumatic stress disorder. She has been seen by pulmonologist and reports that she does not have COPD.  She was started on Symbicort and reports that this has helped her breathing.    Since her last appointment she saw Jory Sims, DNP on 02/2019 and was doing well physically.  She lost 30lb by swimming and working on her diet.  She is struggling with pain in her shoulders.  She is getting ready to have an injection in her shoulder.  She thinks that she has a torn rotator cuff again.  She looks forward to opening her pool soon.  She is wondering if she should get the coronavirus vaccine.  She continues to cut back with smoking.  She is at a half pack daily. She has been using a vape as well.  She struggles with chronic cough.  Today,  She denies any chest pain, shortness of breath, palpitations, or exertional symptoms. No headaches, lightheadedness, or syncope to report. Also has no lower extremity edema, orthopnea or PND.    Past Medical History:  Diagnosis Date   Bronchitis, asthmatic    Coronary artery disease    BMS to LAD 02/2008   Diabetes mellitus without complication ([place])    Hyperlipidemia    Hypertensive heart disease 04/16/2016   Hypothyroidism    MI, old    Morbid obesity ([place]) 04/16/2016    S/P coronary artery stent placement 04/16/2016   BMS to LAD 2009   Tobacco abuse 04/16/2016    Past Surgical History:  Procedure Laterality Date   ABDOMINAL HYSTERECTOMY     ANKLE SURGERY Right    CHOLECYSTECTOMY     CORONARY ANGIOPLASTY  02/2008   bare metal stent to LAD    CORONARY STENT PLACEMENT     KNEE ARTHROSCOPY     ROTATOR CUFF REPAIR Bilateral    THORACIC OUTLET SURGERY     WRIST SURGERY       Current Outpatient Medications  Medication Sig Dispense Refill   Accu-Chek FastClix Lancets MISC USE TO TEST UP TO FOUR TIMES DAILY AS DIRECTED 306 each 2   albuterol (ACCUNEB) 1.25 MG/3ML nebulizer solution Take 3 mLs (1.25 mg total) by nebulization 3 (three) times daily as needed for wheezing. 75 mL 2   albuterol (PROAIR HFA) 108 (90 Base) MCG/ACT inhaler Inhale 2 puffs into the lungs every 6 (six) hours as needed for wheezing or shortness of breath. 6.7 g 3   ALPRAZolam (XANAX) 0.5 MG tablet Take 1 tablet (0.5 mg total) by mouth 2 (two) times daily as needed for anxiety. 60 tablet 3   aspirin 81 MG tablet Take 81 mg by mouth daily.     atorvastatin (LIPITOR) 10 MG tablet  TAKE 1 TABLET(10 MG) BY MOUTH DAILY 90 tablet 3   blood glucose meter kit and supplies KIT Dispense based on patient and insurance preference. Use up to four times daily as directed. DX E11.8 1 each 0   Calcium Carb-Cholecalciferol (CALCIUM 500 + D3 PO) Take by mouth.     canagliflozin (INVOKANA) 100 MG TABS tablet Take 150 mg by mouth daily before breakfast. Taking dose $RemoveBef'150mg'GldLxrRktr$  90 tablet 1   Diclofenac Sodium 2 % SOLN Use times daily as needed. (Patient not taking: Reported on 01/21/2021) 112 g 0   furosemide (LASIX) 20 MG tablet Taking 3 times a week 90 tablet 1   glucose blood test strip Use as instructed 100 each 12   ipratropium (ATROVENT) 0.06 % nasal spray USE 2 SPRAYS IN EACH NOSTRIL FOUR TIMES DAILY AS DIRECTED 45 mL 1   ipratropium-albuterol (DUONEB) 0.5-2.5 (3) MG/3ML SOLN Take 3 mLs  by nebulization every 6 (six) hours as needed. 360 mL 0   isosorbide mononitrate (IMDUR) 60 MG 24 hr tablet Take 1 tablet (60 mg total) by mouth daily. 90 tablet 3   levothyroxine (SYNTHROID) 100 MCG tablet Take 1 tablet (100 mcg total) by mouth daily. 90 tablet 3   Menthol, Topical Analgesic, (BIOFREEZE) 4 % GEL Apply topically.     pantoprazole (PROTONIX) 40 MG tablet TAKE 1 TABLET(40 MG) BY MOUTH DAILY 90 tablet 3   SYMBICORT 80-4.5 MCG/ACT inhaler INHALE 2 PUFFS INTO THE LUNGS TWICE DAILY 10.2 g 3   TRADJENTA 5 MG TABS tablet TAKE 1 TABLET(5 MG) BY MOUTH DAILY 90 tablet 1   vitamin B-12 (CYANOCOBALAMIN) 1000 MCG tablet Take 1,000 mcg by mouth daily.     No current facility-administered medications for this visit.    Allergies:   Clindamycin/lincomycin, Codeine, Cortisone, Dilaudid [hydromorphone hcl], Iodides, Reglan [metoclopramide], Tramadol, and Tape    Social History:  The patient  reports that she has been smoking. She has a 15.50 pack-year smoking history. She has never used smokeless tobacco. She reports that she does not drink alcohol and does not use drugs.   Family History:  The patient's family history includes Cancer in her mother; Diabetes in her son; Hypertension in her son.    ROS:   Please see the history of present illness. (+) All other systems are reviewed and negative.    PHYSICAL EXAM: VS:  LMP  (LMP Unknown)  , BMI There is no height or weight on file to calculate BMI. GENERAL:  Morbidly obese.  Well appearing HEENT: Pupils equal round and reactive, fundi not visualized, oral mucosa unremarkable NECK:  No jugular venous distention, waveform within normal limits, carotid upstroke brisk and symmetric, no bruits LUNGS:  Clear to auscultation bilaterally HEART:  RRR.  PMI not displaced or sustained,S1 and S2 within normal limits, no S3, no S4, no clicks, no rubs, no murmurs ABD:  Flat, positive bowel sounds normal in frequency in pitch, no bruits, no  rebound, no guarding, no midline pulsatile mass, no hepatomegaly, no splenomegaly EXT:  2 plus pulses throughout, no edema, no cyanosis no clubbing SKIN:  No rashes no nodules NEURO:  Cranial nerves II through XII grossly intact, motor grossly intact throughout PSYCH:  Cognitively intact, oriented to person place and time  EKG:   02/14/2021: *** 01/30/20: Sinus rhythm.  Rate 68 bpm.  Low voltage. 12/01/17: Sinus rhythm.  Rate 74 bpm.  Low voltage limb leads   And precordial leads 10/31/16: sinus rhythm. rate 74 bpm. LPFB.  LE  Venous US 06/26/2020: RIGHT:  - There is no evidence of deep vein thrombosis in the lower extremity.  - There is no evidence of superficial venous thrombosis.    - No cystic structure found in the popliteal fossa.   Echo 03/11/2019: 1. The left ventricle has hyperdynamic systolic function, with an  ejection fraction of >65%. The cavity size was normal. Left ventricular  diastolic Doppler parameters are consistent with impaired relaxation.  2. The right ventricle has normal systolic function. The cavity was  normal.  3. The mitral valve is grossly normal. There is moderate mitral annular  calcification present.  4. The tricuspid valve is grossly normal.  5. The aortic valve was not well visualized. No stenosis of the aortic  valve.  6. Technically difficult; definity used; vigorous LV systolic function;  mild diastolic dysfunction.   Lexiscan Myoview 03/02/2019:  Nuclear stress EF: 79%. The left ventricular ejection fraction is hyperdynamic (>65%). The apex was not visualized. Suggest echocardiogram for further evaluation  There was no ST segment deviation noted during stress.  Defect 1: There is a small defect of moderate severity present in the apical anterior, apical septal and apex location. This appears to be c/w a previous apical MI with perhaps a small amount of peri-infarct ischemi a  Findings consistent with prior apical myocardial  infarction.  This is a low risk study.   Recent Labs: 11/08/2020: ALT 12; BUN 19; Creatinine, Ser 1.32; Hemoglobin 17.4; Platelets 179.0; Potassium 4.1; Sodium 138; TSH 1.02    Lipid Panel    Component Value Date/Time   CHOL 124 04/26/2020 1208   TRIG 175 (H) 04/26/2020 1208   HDL 42 (L) 04/26/2020 1208   CHOLHDL 3.0 04/26/2020 1208   VLDL 46.0 (H) 05/16/2019 1055   LDLCALC 57 04/26/2020 1208   LDLDIRECT 60.0 05/16/2019 1055      Wt Readings from Last 3 Encounters:  11/08/20 263 lb 6.4 oz (119.5 kg)  09/28/20 259 lb 0.7 oz (117.5 kg)  08/10/20 259 lb (117.5 kg)      ASSESSMENT AND PLAN: No problem-specific Assessment & Plan notes found for this encounter.   # CAD s/p BMS to LAD: Ms. Stovall continues to do well.  Continue aspirin, atorvastatin, metoprolol, and isosorbide.  She is okay with stopping clopidogrel.  # Hypertensive heart disease: BP well-controlled on spironolactone, metoprolol and Imdur.   # Hyperlipidemia: Continue atorvastatin.  LDL was 60 on 04/2019.  Lipids are followed with her PCP.  # Morbid obesity: Ms. Schwinn was encouraged to start back her exercise and use the pool given her knee pain.   # Tobacco abuse: Patient was encouraged to keep cutting back.  She is currently smoking half a pack daily.  # COVID-19: Ms. Simmonds was encouraged to get the Covid vaccine.  She is quite hesitant.   Current medicines are reviewed at length with the patient today.  The patient does not have concerns regarding medicines.  The following changes have been made:  Stop clopidogrel  Labs/ tests ordered today include:   No orders of the defined types were placed in this encounter.    Disposition:   FU with Tiffany C. Oval Linsey, MD, [facility] *** year  ***  Signed, Tiffany C. Oval Linsey, MD, [facility]  02/12/2021 1:11 PM    [place]

## 2021-02-14 ENCOUNTER — Ambulatory Visit: Payer: Medicare HMO | Admitting: Cardiovascular Disease

## 2021-03-13 ENCOUNTER — Other Ambulatory Visit: Payer: Self-pay

## 2021-03-13 ENCOUNTER — Other Ambulatory Visit: Payer: Self-pay | Admitting: Family Medicine

## 2021-03-20 ENCOUNTER — Telehealth: Payer: Self-pay | Admitting: Cardiovascular Disease

## 2021-03-20 MED ORDER — ISOSORBIDE MONONITRATE ER 60 MG PO TB24: 60.0000 mg | ORAL_TABLET | Freq: Every day | ORAL | 3 refills | Status: DC

## 2021-03-20 NOTE — Telephone Encounter (Signed)
*  STAT* If patient is at the pharmacy, call can be transferred to refill team.   1. Which medications need to be refilled? (please list name of each medication and dose if known) isosorbide mononitrate (IMDUR) 60 MG 24 hr tablet  2. Which pharmacy/location (including street and city if local pharmacy) is medication to be sent to? [facility] [place], [place]  3. Do they need a 30 day or 90 day supply? 90 day supply    Only has enough medication to last her until Saturday.

## 2021-03-22 ENCOUNTER — Ambulatory Visit: Payer: Medicare HMO | Admitting: Physician Assistant

## 2021-03-26 ENCOUNTER — Ambulatory Visit (INDEPENDENT_AMBULATORY_CARE_PROVIDER_SITE_OTHER): Payer: Medicare HMO | Admitting: Family

## 2021-03-26 ENCOUNTER — Encounter: Payer: Self-pay | Admitting: Family

## 2021-03-26 ENCOUNTER — Other Ambulatory Visit: Payer: Self-pay

## 2021-03-26 VITALS — BP 140/78 | HR 79 | Temp 97.9°F | Wt 274.8 lb

## 2021-03-26 DIAGNOSIS — M79644 Pain in right finger(s): Secondary | ICD-10-CM

## 2021-03-26 DIAGNOSIS — M19041 Primary osteoarthritis, right hand: Secondary | ICD-10-CM | POA: Diagnosis not present

## 2021-03-26 DIAGNOSIS — M1712 Unilateral primary osteoarthritis, left knee: Secondary | ICD-10-CM

## 2021-03-26 MED ORDER — MELOXICAM 7.5 MG PO TABS: 7.5000 mg | ORAL_TABLET | Freq: Every day | ORAL | 0 refills | Status: DC

## 2021-03-26 NOTE — Progress Notes (Signed)
Acute Office Visit  Subjective:    Patient ID: DESSA LEDEE, female    DOB: 02-11-1951, 70 y.o.   MRN: [id number]  Chief Complaint  Patient presents with   thumb pain    Right thumb started last week   Leg Pain    Left knee and ankle pain    HPI Patient is in today with complaints of right thumb pain that started last week and has been ongoing for about 3 to 4 days.  Has been trying Tylenol and Biofreeze that have not helped much.  The pain is a 6 out of 10, described as sharp that radiates up her right thumb.  She has noticed mild swelling to the right hand.  Reports slipping in her pool and having to brace herself from her fall about 1 week ago.  Patient has known left knee advanced osteoarthritis.  She needs a knee replacement.  She reports walking differently and that is subsequently caused her to have some left ankle discomfort.  Past Medical History:  Diagnosis Date   Bronchitis, asthmatic    Coronary artery disease    BMS to LAD 02/2008   Diabetes mellitus without complication ([facility])    Hyperlipidemia    Hypertensive heart disease 04/16/2016   Hypothyroidism    MI, old    Morbid obesity ([facility]) 04/16/2016   S/P coronary artery stent placement 04/16/2016   BMS to LAD 2009   Tobacco abuse 04/16/2016    Past Surgical History:  Procedure Laterality Date   ABDOMINAL HYSTERECTOMY     ANKLE SURGERY Right    CHOLECYSTECTOMY     CORONARY ANGIOPLASTY  02/2008   bare metal stent to LAD    CORONARY STENT PLACEMENT     KNEE ARTHROSCOPY     ROTATOR CUFF REPAIR Bilateral    THORACIC OUTLET SURGERY     WRIST SURGERY      Family History  Problem Relation Age of Onset   Cancer Mother    Diabetes Son    Hypertension Son     Social History   Socioeconomic History   Marital status: Married    Spouse name: Not on file   Number of children: Not on file   Years of education: Not on file   Highest education level: Not on file  Occupational History   Occupation: Disabled    Tobacco Use   Smoking status: Light Smoker    Packs/day: 0.50    Years: 31.00    Pack years: 15.50    Types: Cigarettes   Smokeless tobacco: Never  Vaping Use   Vaping Use: Never used  Substance and Sexual Activity   Alcohol use: No   Drug use: No   Sexual activity: Yes    Partners: Male  Other Topics Concern   Not on file  Social History Narrative   Originally from [place]     Still drives    Social Determinants of Health   Financial Resource Strain: Low Risk    Difficulty of Paying Living Expenses: Not hard at all  Food Insecurity: No Food Insecurity   Worried About Programme researcher, broadcasting/film/video in the Last Year: Never true   Barista in the Last Year: Never true  Transportation Needs: No Transportation Needs   Lack of Transportation (Medical): No   Lack of Transportation (Non-Medical): No  Physical Activity: Inactive   Days of Exercise per Week: 0 days   Minutes of Exercise per Session: 0 min  Stress:  No Stress Concern Present   Feeling of Stress : Not at all  Social Connections: Moderately Isolated   Frequency of Communication with Friends and Family: More than three times a week   Frequency of Social Gatherings with Friends and Family: Once a week   Attends Religious Services: Never   Database administrator or Organizations: No   Attends Engineer, structural: Never   Marital Status: Married  Catering manager Violence: Not At Risk   Fear of Current or Ex-Partner: No   Emotionally Abused: No   Physically Abused: No   Sexually Abused: No    Outpatient Medications Prior to Visit  Medication Sig Dispense Refill   Accu-Chek FastClix Lancets MISC USE TO TEST UP TO FOUR TIMES DAILY AS DIRECTED 306 each 2   albuterol (ACCUNEB) 1.25 MG/3ML nebulizer solution Take 3 mLs (1.25 mg total) by nebulization 3 (three) times daily as needed for wheezing. 75 mL 2   albuterol (VENTOLIN HFA) 108 (90 Base) MCG/ACT inhaler INHALE 2 PUFFS INTO THE LUNGS EVERY 6 HOURS AS  NEEDED FOR WHEEZING OR SHORTNESS OF BREATH 6.7 g 3   ALPRAZolam (XANAX) 0.5 MG tablet Take 1 tablet (0.5 mg total) by mouth 2 (two) times daily as needed for anxiety. 60 tablet 3   aspirin 81 MG tablet Take 81 mg by mouth daily.     atorvastatin (LIPITOR) 10 MG tablet TAKE 1 TABLET(10 MG) BY MOUTH DAILY 90 tablet 3   blood glucose meter kit and supplies KIT Dispense based on patient and insurance preference. Use up to four times daily as directed. DX E11.8 1 each 0   budesonide-formoterol (SYMBICORT) 80-4.5 MCG/ACT inhaler INHALE 2 PUFFS INTO THE LUNGS TWICE DAILY 10.2 g 0   Calcium Carb-Cholecalciferol (CALCIUM 500 + D3 PO) Take by mouth.     canagliflozin (INVOKANA) 100 MG TABS tablet Take 150 mg by mouth daily before breakfast. Taking dose 150mg  90 tablet 1   furosemide (LASIX) 20 MG tablet Taking 3 times a week 90 tablet 1   glucose blood test strip Use as instructed 100 each 12   ipratropium (ATROVENT) 0.06 % nasal spray USE 2 SPRAYS IN EACH NOSTRIL FOUR TIMES DAILY AS DIRECTED 45 mL 1   ipratropium-albuterol (DUONEB) 0.5-2.5 (3) MG/3ML SOLN Take 3 mLs by nebulization every 6 (six) hours as needed. 360 mL 0   isosorbide mononitrate (IMDUR) 60 MG 24 hr tablet Take 1 tablet (60 mg total) by mouth daily. 90 tablet 3   levothyroxine (SYNTHROID) 100 MCG tablet Take 1 tablet (100 mcg total) by mouth daily. 90 tablet 3   Menthol, Topical Analgesic, (BIOFREEZE) 4 % GEL Apply topically.     pantoprazole (PROTONIX) 40 MG tablet TAKE 1 TABLET(40 MG) BY MOUTH DAILY 90 tablet 3   TRADJENTA 5 MG TABS tablet TAKE 1 TABLET(5 MG) BY MOUTH DAILY 90 tablet 1   vitamin B-12 (CYANOCOBALAMIN) 1000 MCG tablet Take 1,000 mcg by mouth daily.     Diclofenac Sodium 2 % SOLN Use times daily as needed. 112 g 0   No facility-administered medications prior to visit.    Allergies  Allergen Reactions   Clindamycin/Lincomycin Other (See Comments)    Head ache    Codeine Hives   Cortisone    Dilaudid [Hydromorphone  Hcl] Nausea And Vomiting   Iodides    Reglan [Metoclopramide] Other (See Comments)    Other reaction(s): Dystonia Hallucinations/Violent   Tramadol    Tape Rash    Blisters  Review of Systems  Constitutional: Negative.   Respiratory: Negative.    Cardiovascular: Negative.   Gastrointestinal: Negative.   Endocrine: Negative.   Genitourinary: Negative.   Musculoskeletal:  Positive for arthralgias.       Right thumb pain, left knee pain, left ankle pain  Skin: Negative.   Allergic/Immunologic: Negative.   Neurological: Negative.   Hematological: Negative.   Psychiatric/Behavioral: Negative.    All other systems reviewed and are negative.     Objective:    Physical Exam Vitals and nursing note reviewed.  Constitutional:      Appearance: Normal appearance. She is obese.     Comments: Ambulates with a cane  Cardiovascular:     Rate and Rhythm: Normal rate and regular rhythm.  Pulmonary:     Effort: Pulmonary effort is normal.     Breath sounds: Normal breath sounds.  Abdominal:     General: Abdomen is flat.     Palpations: Abdomen is soft.  Musculoskeletal:     Comments: Right thumb: Mild swelling.  Tenderness at the MIP joint.  No redness.  Pain with range of motion.  Left knee: Stable, ambulating with a cane.  Crepitus noted.  Skin:    General: Skin is warm and dry.  Neurological:     General: No focal deficit present.     Mental Status: She is alert and oriented to person, place, and time.  Psychiatric:        Mood and Affect: Mood normal.        Behavior: Behavior normal.    BP 140/78   Pulse 79   Temp 97.9 F (36.6 C) (Temporal)   Wt 274 lb 12.8 oz (124.6 kg)   LMP  (LMP Unknown)   SpO2 96%   BMI 45.73 kg/m  Wt Readings from Last 3 Encounters:  03/26/21 274 lb 12.8 oz (124.6 kg)  11/08/20 263 lb 6.4 oz (119.5 kg)  09/28/20 259 lb 0.7 oz (117.5 kg)    Health Maintenance Due  Topic Date Due   Zoster Vaccines- Shingrix (1 of 2) Never done    OPHTHALMOLOGY EXAM  10/02/2018   COVID-19 Vaccine (3 - Booster for Moderna series) 08/05/2020   PNA vac Low Risk Adult (2 of 2 - PCV13) 12/18/2020    There are no preventive care reminders to display for this patient.   Lab Results  Component Value Date   TSH 1.02 11/08/2020   Lab Results  Component Value Date   WBC 8.1 11/08/2020   HGB 17.4 (H) 11/08/2020   HCT 52.3 (H) 11/08/2020   MCV 92.4 11/08/2020   PLT 179.0 11/08/2020   Lab Results  Component Value Date   NA 138 11/08/2020   K 4.1 11/08/2020   CO2 32 11/08/2020   GLUCOSE 153 (H) 11/08/2020   BUN 19 11/08/2020   CREATININE 1.32 (H) 11/08/2020   BILITOT 0.9 11/08/2020   ALKPHOS 98 11/08/2020   AST 13 11/08/2020   ALT 12 11/08/2020   PROT 7.4 11/08/2020   ALBUMIN 4.0 11/08/2020   CALCIUM 9.8 11/08/2020   ANIONGAP 9 02/16/2019   GFR 41.81 (L) 11/08/2020   Lab Results  Component Value Date   CHOL 124 04/26/2020   Lab Results  Component Value Date   HDL 42 (L) 04/26/2020   Lab Results  Component Value Date   LDLCALC 57 04/26/2020   Lab Results  Component Value Date   TRIG 175 (H) 04/26/2020   Lab Results  Component Value Date  CHOLHDL 3.0 04/26/2020   Lab Results  Component Value Date   HGBA1C 6.6 (H) 11/08/2020       Assessment & Plan:   Problem List Items Addressed This Visit   None Visit Diagnoses     Thumb pain, right    -  Primary   Arthritis of finger of right hand       Relevant Medications   meloxicam (MOBIC) 7.5 MG tablet   Arthritis of left knee       Relevant Medications   meloxicam (MOBIC) 7.5 MG tablet        Meds ordered this encounter  Medications   meloxicam (MOBIC) 7.5 MG tablet    Sig: Take 1 tablet (7.5 mg total) by mouth daily.    Dispense:  30 tablet    Refill:  0   Plan: Meloxicam 7.5 mg to be taken once a day with food.  Biofreeze as needed.  Discontinue diclofenac.  Call the office if symptoms worsen or persist.  Recheck as scheduled and sooner as  needed.  Kennyth Arnold, FNP

## 2021-04-01 ENCOUNTER — Encounter: Payer: Self-pay | Admitting: Family Medicine

## 2021-04-03 ENCOUNTER — Telehealth: Payer: Self-pay

## 2021-04-03 DIAGNOSIS — J4541 Moderate persistent asthma with (acute) exacerbation: Secondary | ICD-10-CM

## 2021-04-03 MED ORDER — CANAGLIFLOZIN 300 MG PO TABS: ORAL_TABLET | ORAL | 0 refills | Status: DC

## 2021-04-03 MED ORDER — IPRATROPIUM BROMIDE 0.06 % NA SOLN: NASAL | 0 refills | Status: DC

## 2021-04-03 MED ORDER — MELOXICAM 7.5 MG PO TABS: 7.5000 mg | ORAL_TABLET | Freq: Every day | ORAL | 0 refills | Status: DC

## 2021-04-03 MED ORDER — BUDESONIDE-FORMOTEROL FUMARATE 80-4.5 MCG/ACT IN AERO: 2.0000 | INHALATION_SPRAY | Freq: Two times a day (BID) | RESPIRATORY_TRACT | 0 refills | Status: DC

## 2021-04-03 NOTE — Telephone Encounter (Signed)
Spoke to pt asked her how much Invokana she is taking. Pt  said she is taking a 300 mg tablet and cutting it in half. Told pt okay I will send Rx's to the pharmacy for you. Pt verbalized understanding. Rx's sent.

## 2021-04-03 NOTE — Telephone Encounter (Signed)
  LAST APPOINTMENT DATE: 03/26/2021   NEXT APPOINTMENT DATE:@8 /26/2022 -TOC to alyssa   MEDICATION:canagliflozin (INVOKANA) 100 MG TABS tablet // budesonide-formoterol (SYMBICORT) 80-4.5 MCG/ACT inhaler//meloxicam (MOBIC) 7.5 MG tablet//ipratropium (ATROVENT) 0.06 % nasal spray  PHARMACY: [id number] - [place], [place] - [address]  COMMENTS: Patient is stating that the Mobic was helping her arthritis and was wondering if she can get a refill.

## 2021-04-03 NOTE — Telephone Encounter (Signed)
Please advise if okay to refill medications requested. Pt is scheduled to see you 8/26.

## 2021-04-10 ENCOUNTER — Telehealth: Payer: Self-pay | Admitting: Cardiovascular Disease

## 2021-04-10 ENCOUNTER — Other Ambulatory Visit: Payer: Self-pay | Admitting: Adult Health

## 2021-04-10 NOTE — Telephone Encounter (Signed)
Refill for Atorvastatin 10 mg has been sent to [facility].

## 2021-04-10 NOTE — Telephone Encounter (Signed)
    *  STAT* If patient is at the pharmacy, call can be transferred to refill team.   1. Which medications need to be refilled? (please list name of each medication and dose if known)   atorvastatin (LIPITOR) 10 MG tablet    2. Which pharmacy/location (including street and city if local pharmacy) is medication to be sent to?[facility] [address], [place]  3. Do they need a 30 day or 90 day supply? 90 days

## 2021-04-22 ENCOUNTER — Other Ambulatory Visit: Payer: Self-pay | Admitting: Family Medicine

## 2021-04-30 ENCOUNTER — Other Ambulatory Visit: Payer: Self-pay

## 2021-04-30 MED ORDER — MELOXICAM 7.5 MG PO TABS: 7.5000 mg | ORAL_TABLET | Freq: Every day | ORAL | 0 refills | Status: DC

## 2021-04-30 MED ORDER — BUDESONIDE-FORMOTEROL FUMARATE 80-4.5 MCG/ACT IN AERO: 2.0000 | INHALATION_SPRAY | Freq: Two times a day (BID) | RESPIRATORY_TRACT | 0 refills | Status: DC

## 2021-04-30 MED ORDER — PANTOPRAZOLE SODIUM 40 MG PO TBEC: 40.0000 mg | DELAYED_RELEASE_TABLET | Freq: Every day | ORAL | 0 refills | Status: DC

## 2021-04-30 NOTE — Telephone Encounter (Signed)
MEDICATION:  budesonide-formoterol (SYMBICORT) 80-4.5 MCG/ACT inhaler  pantoprazole (PROTONIX) 40 MG tablet   meloxicam (MOBIC) 7.5 MG tablet  PHARMACY:  [facility] [place], [place] AT [address] Phone:  [phone]  Fax:  [phone]      Comments:   **Let patient know to contact pharmacy at the end of the day to make sure medication is ready. **  ** Please notify patient to allow 48-72 hours to process**  **Encourage patient to contact the pharmacy for refills or they can request refills through [facility]**

## 2021-04-30 NOTE — Telephone Encounter (Signed)
Pt requesting refills. Scheduled for TOC on 8/26 with you.

## 2021-05-08 ENCOUNTER — Ambulatory Visit: Payer: Medicare HMO | Admitting: Family Medicine

## 2021-05-13 ENCOUNTER — Other Ambulatory Visit: Payer: Self-pay

## 2021-05-13 ENCOUNTER — Encounter (HOSPITAL_BASED_OUTPATIENT_CLINIC_OR_DEPARTMENT_OTHER): Payer: Self-pay | Admitting: Cardiovascular Disease

## 2021-05-13 ENCOUNTER — Ambulatory Visit (HOSPITAL_BASED_OUTPATIENT_CLINIC_OR_DEPARTMENT_OTHER): Payer: Medicare HMO | Admitting: Cardiovascular Disease

## 2021-05-13 DIAGNOSIS — F17210 Nicotine dependence, cigarettes, uncomplicated: Secondary | ICD-10-CM

## 2021-05-13 DIAGNOSIS — E785 Hyperlipidemia, unspecified: Secondary | ICD-10-CM

## 2021-05-13 DIAGNOSIS — E1169 Type 2 diabetes mellitus with other specified complication: Secondary | ICD-10-CM

## 2021-05-13 DIAGNOSIS — I2510 Atherosclerotic heart disease of native coronary artery without angina pectoris: Secondary | ICD-10-CM

## 2021-05-13 NOTE — Assessment & Plan Note (Signed)
Continue atorvastatin.  Lipids will be checked with her PCP next week.

## 2021-05-13 NOTE — Assessment & Plan Note (Addendum)
She has no angina.  Continue asprin, Imdur, and atorvastatin.  She will have lipids checked with her PCP next week.

## 2021-05-13 NOTE — Patient Instructions (Signed)
Medication Instructions:  Your physician recommends that you continue on your current medications as directed. Please refer to the Current Medication list given to you today.   *If you need a refill on your cardiac medications before your next appointment, please call your pharmacy*  Lab Work: NONE  Testing/Procedures: NONE  Follow-Up: At [facility], you and your health needs are our priority.  As part of our continuing mission to provide you with exceptional heart care, we have created designated Provider Care Teams.  These Care Teams include your primary Cardiologist (physician) and Advanced Practice Providers (APPs -  Physician Assistants and Nurse Practitioners) who all work together to provide you with the care you need, when you need it.  We recommend signing up for the patient portal called "MyChart".  Sign up information is provided on this After Visit Summary.  MyChart is used to connect with patients for Virtual Visits (Telemedicine).  Patients are able to view lab/test results, encounter notes, upcoming appointments, etc.  Non-urgent messages can be sent to your provider as well.   To learn more about what you can do with MyChart, go to [email or web address].    Your next appointment:   12 month(s)  The format for your next appointment:   In Person  Provider:   Tiffany [place], MD or Caitlin Walker, NP{         

## 2021-05-13 NOTE — Progress Notes (Signed)
Cardiology Office Note   Date:  05/13/2021   ID:  [name] [name], [name] [name] 01-16-1951, MRN [id number]  PCP:  Pcp, No  Cardiologist:   Skeet Latch, MD   No chief complaint on file.    History of Present Illness: [name] [name] is a 70 y.o. female with CAD s/p PCI, COPD, morbid obesity, chronic bronchitis, and hypothyroidism who presents for follow up.  While living in [place] she had a BMS in the LAD 02/2008.  Since then she has done very well.  She had a 2 day Persantine Cardiolite on 01/07/13 that was negative for ischemia. LVEF was 79%.  She is the primary caretaker of her 72 year old grandson who has ADD.  Her daughter also lives with her.  Her husband also suffers from posttraumatic stress disorder. She has been seen by pulmonologist and reports that she does not have COPD.  She was started on Symbicort and reports that this has helped her breathing.    She saw Jory Sims, DNP on 02/2019 and was doing well physically.  She lost 30lb by swimming and working on her diet.    Today, she is doing good overall. Every once in a while she is having a shooting pain in her chest. She would usually be resting when this occurs, and the pain does not last long. This has not recurred in the past month. She does not believe this is related to her heart, as this is similar to pain she had in the past that was due to caffeine pockets/cysts. She is going to schedule for a mammogram soon. For exercise, she likes to walk in the pool when she is able. When she walks too much outside of the pool she is limited by pain and imbalance due to sciatica. She has gained about 10 pounds mostly due to cravings for chips and sweets, and continues to work on her diet. Typically she does not eat a lot of food and tries to avoid carbs. Last night she had a quarter of a chicken breast and green beans. She has cut back on smoking to 1/2 ppd. Previously she was infected with COVID and was ill and fatigued for about three days. She  denies any palpitations, or shortness of breath. No lightheadedness, headaches, syncope, orthopnea, or PND. Also has no lower extremity edema.   Past Medical History:  Diagnosis Date   Bronchitis, asthmatic    Coronary artery disease    BMS to LAD 02/2008   Diabetes mellitus without complication ([place])    Hyperlipidemia    Hypertensive heart disease 04/16/2016   Hypothyroidism    MI, old    Morbid obesity ([place]) 04/16/2016   S/P coronary artery stent placement 04/16/2016   BMS to LAD 2009   Tobacco abuse 04/16/2016    Past Surgical History:  Procedure Laterality Date   ABDOMINAL HYSTERECTOMY     ANKLE SURGERY Right    CHOLECYSTECTOMY     CORONARY ANGIOPLASTY  02/2008   bare metal stent to LAD    CORONARY STENT PLACEMENT     KNEE ARTHROSCOPY     ROTATOR CUFF REPAIR Bilateral    THORACIC OUTLET SURGERY     WRIST SURGERY       Current Outpatient Medications  Medication Sig Dispense Refill   Accu-Chek FastClix Lancets MISC USE TO TEST UP TO FOUR TIMES DAILY AS DIRECTED 306 each 2   albuterol (ACCUNEB) 1.25 MG/3ML nebulizer solution Take 3 mLs (1.25 mg total) by nebulization 3 (  three) times daily as needed for wheezing. 75 mL 2   albuterol (VENTOLIN HFA) 108 (90 Base) MCG/ACT inhaler INHALE 2 PUFFS INTO THE LUNGS EVERY 6 HOURS AS NEEDED FOR WHEEZING OR SHORTNESS OF BREATH 6.7 g 3   ALPRAZolam (XANAX) 0.5 MG tablet Take 1 tablet (0.5 mg total) by mouth 2 (two) times daily as needed for anxiety. 60 tablet 3   aspirin 81 MG tablet Take 81 mg by mouth daily.     atorvastatin (LIPITOR) 10 MG tablet TAKE 1 TABLET(10 MG) BY MOUTH DAILY 90 tablet 1   blood glucose meter kit and supplies KIT Dispense based on patient and insurance preference. Use up to four times daily as directed. DX E11.8 1 each 0   budesonide-formoterol (SYMBICORT) 80-4.5 MCG/ACT inhaler Inhale 2 puffs into the lungs 2 (two) times daily. 10.2 g 0   Calcium Carb-Cholecalciferol (CALCIUM 500 + D3 PO) Take by mouth.      canagliflozin (INVOKANA) 300 MG TABS tablet TAKE 1/2 TABLET (150 MG) BY MOUTH DAILY WITH BREAKFAST 30 tablet 0   furosemide (LASIX) 20 MG tablet Taking 3 times a week 90 tablet 1   glucose blood test strip Use as instructed 100 each 12   ipratropium (ATROVENT) 0.06 % nasal spray USE 2 SPRAYS IN EACH NOSTRIL FOUR TIMES DAILY AS DIRECTED 45 mL 0   ipratropium-albuterol (DUONEB) 0.5-2.5 (3) MG/3ML SOLN Take 3 mLs by nebulization every 6 (six) hours as needed. 360 mL 0   isosorbide mononitrate (IMDUR) 60 MG 24 hr tablet Take 1 tablet (60 mg total) by mouth daily. 90 tablet 3   levothyroxine (SYNTHROID) 100 MCG tablet Take 1 tablet (100 mcg total) by mouth daily. 90 tablet 3   meloxicam (MOBIC) 7.5 MG tablet Take 1 tablet (7.5 mg total) by mouth daily. 30 tablet 0   Menthol, Topical Analgesic, (BIOFREEZE) 4 % GEL Apply topically.     pantoprazole (PROTONIX) 40 MG tablet Take 1 tablet (40 mg total) by mouth daily. 90 tablet 0   TRADJENTA 5 MG TABS tablet TAKE 1 TABLET(5 MG) BY MOUTH DAILY 90 tablet 1   vitamin B-12 (CYANOCOBALAMIN) 1000 MCG tablet Take 1,000 mcg by mouth daily.     No current facility-administered medications for this visit.    Allergies:   Clindamycin/lincomycin, Codeine, Cortisone, Dilaudid [hydromorphone hcl], Iodides, Reglan [metoclopramide], Tramadol, and Tape    Social History:  The patient  reports that she has been smoking cigarettes. She has a 15.50 pack-year smoking history. She has never used smokeless tobacco. She reports that she does not drink alcohol and does not use drugs.   Family History:  The patient's family history includes Cancer in her mother; Diabetes in her son; Hypertension in her son.    ROS:   Please see the history of present illness. (+) Chest pain (+) Sciatica All other systems are reviewed and negative.    PHYSICAL EXAM: VS:  BP 118/64   Pulse 74   Ht $R'5\' 5"'oK$  (1.651 m)   Wt 274 lb 12.8 oz (124.6 kg)   LMP  (LMP Unknown)   BMI 45.73 kg/m   , BMI Body mass index is 45.73 kg/m. GENERAL:  Morbidly obese.  Well appearing HEENT: Pupils equal round and reactive, fundi not visualized, oral mucosa unremarkable NECK:  No jugular venous distention, waveform within normal limits, carotid upstroke brisk and symmetric, no bruits LUNGS:  Clear to auscultation bilaterally HEART:  RRR.  PMI not displaced or sustained,S1 and S2 within normal limits,  no S3, no S4, no clicks, no rubs, no murmurs ABD:  Flat, positive bowel sounds normal in frequency in pitch, no bruits, no rebound, no guarding, no midline pulsatile mass, no hepatomegaly, no splenomegaly EXT:  2 plus pulses throughout, no edema, no cyanosis no clubbing SKIN:  No rashes no nodules NEURO:  Cranial nerves II through XII grossly intact, motor grossly intact throughout PSYCH:  Cognitively intact, oriented to person place and time  EKG:   05/13/2021: Sinus rhythm. Rate 74 bpm. 01/30/20.  Sinus rhythm.  Rate 68 bpm.  Low voltage. 12/01/17: Sinus rhythm.  Rate 74 bpm.  Low voltage limb leads   And precordial leads 10/31/16: sinus rhythm rate 74 bpm. LPFB.  Echo 03/11/2019:  1. The left ventricle has hyperdynamic systolic function, with an  ejection fraction of >65%. The cavity size was normal. Left ventricular  diastolic Doppler parameters are consistent with impaired relaxation.   2. The right ventricle has normal systolic function. The cavity was  normal.   3. The mitral valve is grossly normal. There is moderate mitral annular  calcification present.   4. The tricuspid valve is grossly normal.   5. The aortic valve was not well visualized. No stenosis of the aortic  valve.   6. Technically difficult; definity used; vigorous LV systolic function;  mild diastolic dysfunction.   Lexiscan Myoview 03/02/2019: Nuclear stress EF: 79%. The left ventricular ejection fraction is hyperdynamic (>65%). The apex was not visualized. Suggest echocardiogram for further evaluation There was no ST  segment deviation noted during stress. Defect 1: There is a small defect of moderate severity present in the apical anterior, apical septal and apex location. This appears to be c/w a previous apical MI with perhaps a small amount of peri-infarct ischemia Findings consistent with prior apical myocardial infarction. This is a low risk study.  Recent Labs: 11/08/2020: ALT 12; BUN 19; Creatinine, Ser 1.32; Hemoglobin 17.4; Platelets 179.0; Potassium 4.1; Sodium 138; TSH 1.02    Lipid Panel    Component Value Date/Time   CHOL 124 04/26/2020 1208   TRIG 175 (H) 04/26/2020 1208   HDL 42 (L) 04/26/2020 1208   CHOLHDL 3.0 04/26/2020 1208   VLDL 46.0 (H) 05/16/2019 1055   LDLCALC 57 04/26/2020 1208   LDLDIRECT 60.0 05/16/2019 1055      Wt Readings from Last 3 Encounters:  05/13/21 274 lb 12.8 oz (124.6 kg)  03/26/21 274 lb 12.8 oz (124.6 kg)  11/08/20 263 lb 6.4 oz (119.5 kg)      ASSESSMENT AND PLAN: CAD, BMS to LAD 02/2008 She has no angina.  Continue asprin, Imdur, and atorvastatin.  She will have lipids checked with her PCP next week.   Morbid (severe) obesity due to excess calories ([place]) Offered referral to PrEP and the healthy weight and wellness clinic.  She is not interested in either.  She notes that her ability to exercise is limited by her back pain and is frustrated that she was only able to get in her pool a few times this summer.  This is her usual source of exercise.  We did discuss using the pool at the [facility] or in our facility.  However she is fearful of using indoor facilities and developing respiratory infections.  Nicotine dependence, cigarrettes, precontemplative Discussed her tobacco abuse.  She is down to half a pack of cigarettes daily and not interested in assistance to quit.  Hyperlipidemia associated with type 2 diabetes mellitus ([place]), on Lipitor Continue atorvastatin.  Lipids will  be checked with her PCP next week.    Current medicines are reviewed at  length with the patient today.  The patient does not have concerns regarding medicines.  The following changes have been made:  none  Labs/ tests ordered today include:   Orders Placed This Encounter  Procedures   EKG 12-Lead      Disposition:   FU with Naomi Castrogiovanni C. Oval Linsey, MD, [facility] in 1 year  I,Mathew Stumpf,acting as a scribe for Skeet Latch, MD.,have documented all relevant documentation on the behalf of Skeet Latch, MD,as directed by  Skeet Latch, MD while in the presence of Skeet Latch, MD.  I, [place] Oval Linsey, MD have reviewed all documentation for this visit.  The documentation of the exam, diagnosis, procedures, and orders on 05/13/2021 are all accurate and complete.   Signed, Chrisha Vogel C. Oval Linsey, MD, [facility]  05/13/2021 12:57 PM    [facility]

## 2021-05-13 NOTE — Assessment & Plan Note (Signed)
Offered referral to PrEP and the healthy weight and wellness clinic.  She is not interested in either.  She notes that her ability to exercise is limited by her back pain and is frustrated that she was only able to get in her pool a few times this summer.  This is her usual source of exercise.  We did discuss using the pool at the [facility] or in our facility.  However she is fearful of using indoor facilities and developing respiratory infections.

## 2021-05-13 NOTE — Assessment & Plan Note (Signed)
Discussed her tobacco abuse.  She is down to half a pack of cigarettes daily and not interested in assistance to quit.

## 2021-05-17 ENCOUNTER — Ambulatory Visit (INDEPENDENT_AMBULATORY_CARE_PROVIDER_SITE_OTHER): Payer: Medicare HMO | Admitting: Physician Assistant

## 2021-05-17 ENCOUNTER — Encounter: Payer: Self-pay | Admitting: Physician Assistant

## 2021-05-17 ENCOUNTER — Other Ambulatory Visit: Payer: Self-pay

## 2021-05-17 VITALS — BP 97/65 | HR 79 | Temp 97.9°F | Ht 65.0 in | Wt 275.4 lb

## 2021-05-17 DIAGNOSIS — E039 Hypothyroidism, unspecified: Secondary | ICD-10-CM | POA: Diagnosis not present

## 2021-05-17 DIAGNOSIS — E785 Hyperlipidemia, unspecified: Secondary | ICD-10-CM | POA: Diagnosis not present

## 2021-05-17 DIAGNOSIS — E1169 Type 2 diabetes mellitus with other specified complication: Secondary | ICD-10-CM

## 2021-05-17 DIAGNOSIS — I152 Hypertension secondary to endocrine disorders: Secondary | ICD-10-CM

## 2021-05-17 DIAGNOSIS — J4541 Moderate persistent asthma with (acute) exacerbation: Secondary | ICD-10-CM | POA: Diagnosis not present

## 2021-05-17 DIAGNOSIS — E1159 Type 2 diabetes mellitus with other circulatory complications: Secondary | ICD-10-CM | POA: Diagnosis not present

## 2021-05-17 DIAGNOSIS — D751 Secondary polycythemia: Secondary | ICD-10-CM

## 2021-05-17 DIAGNOSIS — N1832 Chronic kidney disease, stage 3b: Secondary | ICD-10-CM

## 2021-05-17 DIAGNOSIS — F419 Anxiety disorder, unspecified: Secondary | ICD-10-CM | POA: Diagnosis not present

## 2021-05-17 NOTE — Patient Instructions (Signed)
Good to meet you today! Please schedule for fasting labs on Monday 05/20/21 - orders placed in Epic already Keep working on cutting back snacking. Measure out serving sizes to avoid mindless snacking.   Recheck in about 4 months with me.  Call sooner if any concerns!

## 2021-05-17 NOTE — Progress Notes (Signed)
Established Patient Office Visit  Subjective:  Patient ID: [name] [name], female    DOB: May 20, 1951  Age: 70 y.o. MRN: [id number]  CC:  Chief Complaint  Patient presents with   Hypertension   Diabetes   Hypothyroidism   Transitions Of Care    [name]     HPI [name] presents for [facility] from Dr. Artis Flock and recheck on multiple chronic issues. See A/P for details as she does not have any acute concerns today. She is not fasting today and will schedule for labs next week.   Past Medical History:  Diagnosis Date   Bronchitis, asthmatic    Coronary artery disease    BMS to LAD 02/2008   Diabetes mellitus without complication (HCC)    Hyperlipidemia    Hypertensive heart disease 04/16/2016   Hypothyroidism    MI, old    Morbid obesity (HCC) 04/16/2016   S/P coronary artery stent placement 04/16/2016   BMS to LAD 2009   Tobacco abuse 04/16/2016    Past Surgical History:  Procedure Laterality Date   ABDOMINAL HYSTERECTOMY     ANKLE SURGERY Right    CHOLECYSTECTOMY     CORONARY ANGIOPLASTY  02/2008   bare metal stent to LAD    CORONARY STENT PLACEMENT     KNEE ARTHROSCOPY     ROTATOR CUFF REPAIR Bilateral    THORACIC OUTLET SURGERY     WRIST SURGERY      Family History  Problem Relation Age of Onset   Cancer Mother    Diabetes Son    Hypertension Son     Social History   Socioeconomic History   Marital status: Married    Spouse name: Not on file   Number of children: Not on file   Years of education: Not on file   Highest education level: Not on file  Occupational History   Occupation: Disabled   Tobacco Use   Smoking status: Light Smoker    Packs/day: 0.50    Years: 31.00    Pack years: 15.50    Types: Cigarettes   Smokeless tobacco: Never  Vaping Use   Vaping Use: Never used  Substance and Sexual Activity   Alcohol use: No   Drug use: No   Sexual activity: Yes    Partners: Male  Other Topics Concern   Not on file  Social History Narrative    Originally from [place]     Still drives    Social Determinants of Health   Financial Resource Strain: Low Risk    Difficulty of Paying Living Expenses: Not hard at all  Food Insecurity: No Food Insecurity   Worried About Programme researcher, broadcasting/film/video in the Last Year: Never true   Barista in the Last Year: Never true  Transportation Needs: No Transportation Needs   Lack of Transportation (Medical): No   Lack of Transportation (Non-Medical): No  Physical Activity: Inactive   Days of Exercise per Week: 0 days   Minutes of Exercise per Session: 0 min  Stress: No Stress Concern Present   Feeling of Stress : Not at all  Social Connections: Moderately Isolated   Frequency of Communication with Friends and Family: More than three times a week   Frequency of Social Gatherings with Friends and Family: Once a week   Attends Religious Services: Never   Database administrator or Organizations: No   Attends Banker Meetings: Never   Marital Status: Married  Intimate  Partner Violence: Not At Risk   Fear of Current or Ex-Partner: No   Emotionally Abused: No   Physically Abused: No   Sexually Abused: No    Outpatient Medications Prior to Visit  Medication Sig Dispense Refill   Accu-Chek FastClix Lancets MISC USE TO TEST UP TO FOUR TIMES DAILY AS DIRECTED 306 each 2   albuterol (ACCUNEB) 1.25 MG/3ML nebulizer solution Take 3 mLs (1.25 mg total) by nebulization 3 (three) times daily as needed for wheezing. 75 mL 2   albuterol (VENTOLIN HFA) 108 (90 Base) MCG/ACT inhaler INHALE 2 PUFFS INTO THE LUNGS EVERY 6 HOURS AS NEEDED FOR WHEEZING OR SHORTNESS OF BREATH 6.7 g 3   ALPRAZolam (XANAX) 0.5 MG tablet Take 1 tablet (0.5 mg total) by mouth 2 (two) times daily as needed for anxiety. 60 tablet 3   aspirin 81 MG tablet Take 81 mg by mouth daily.     atorvastatin (LIPITOR) 10 MG tablet TAKE 1 TABLET(10 MG) BY MOUTH DAILY 90 tablet 1   blood glucose meter kit and supplies KIT Dispense  based on patient and insurance preference. Use up to four times daily as directed. DX E11.8 1 each 0   budesonide-formoterol (SYMBICORT) 80-4.5 MCG/ACT inhaler Inhale 2 puffs into the lungs 2 (two) times daily. 10.2 g 0   Calcium Carb-Cholecalciferol (CALCIUM 500 + D3 PO) Take by mouth.     canagliflozin (INVOKANA) 300 MG TABS tablet TAKE 1/2 TABLET (150 MG) BY MOUTH DAILY WITH BREAKFAST 30 tablet 0   furosemide (LASIX) 20 MG tablet Taking 3 times a week 90 tablet 1   glucose blood test strip Use as instructed 100 each 12   ipratropium (ATROVENT) 0.06 % nasal spray USE 2 SPRAYS IN EACH NOSTRIL FOUR TIMES DAILY AS DIRECTED 45 mL 0   ipratropium-albuterol (DUONEB) 0.5-2.5 (3) MG/3ML SOLN Take 3 mLs by nebulization every 6 (six) hours as needed. 360 mL 0   isosorbide mononitrate (IMDUR) 60 MG 24 hr tablet Take 1 tablet (60 mg total) by mouth daily. 90 tablet 3   levothyroxine (SYNTHROID) 100 MCG tablet Take 1 tablet (100 mcg total) by mouth daily. 90 tablet 3   meloxicam (MOBIC) 7.5 MG tablet Take 1 tablet (7.5 mg total) by mouth daily. 30 tablet 0   Menthol, Topical Analgesic, (BIOFREEZE) 4 % GEL Apply topically.     pantoprazole (PROTONIX) 40 MG tablet Take 1 tablet (40 mg total) by mouth daily. 90 tablet 0   TRADJENTA 5 MG TABS tablet TAKE 1 TABLET(5 MG) BY MOUTH DAILY 90 tablet 1   vitamin B-12 (CYANOCOBALAMIN) 1000 MCG tablet Take 1,000 mcg by mouth daily.     No facility-administered medications prior to visit.    Allergies  Allergen Reactions   Clindamycin/Lincomycin Other (See Comments)    Head ache    Codeine Hives   Cortisone    Dilaudid [Hydromorphone Hcl] Nausea And Vomiting   Iodides    Reglan [Metoclopramide] Other (See Comments)    Other reaction(s): Dystonia Hallucinations/Violent   Tramadol    Tape Rash    Blisters     ROS Review of Systems REFER TO HPI FOR PERTINENT POSITIVES AND NEGATIVES    Objective:    Physical Exam Vitals and nursing note reviewed.   Constitutional:      General: She is not in acute distress.    Appearance: Normal appearance. She is obese.  HENT:     Head: Normocephalic.     Right Ear: External ear normal.  Left Ear: External ear normal.     Nose: Nose normal.     Mouth/Throat:     Mouth: Mucous membranes are moist.  Eyes:     Extraocular Movements: Extraocular movements intact.     Conjunctiva/sclera: Conjunctivae normal.     Pupils: Pupils are equal, round, and reactive to light.  Cardiovascular:     Rate and Rhythm: Normal rate and regular rhythm.     Pulses: Normal pulses.     Heart sounds: No murmur heard. Pulmonary:     Effort: Pulmonary effort is normal.     Breath sounds: Normal breath sounds.  Abdominal:     General: Abdomen is flat. Bowel sounds are normal.     Palpations: Abdomen is soft.     Tenderness: There is no abdominal tenderness.  Musculoskeletal:        General: Normal range of motion.     Cervical back: Normal range of motion.  Skin:    General: Skin is warm.  Neurological:     General: No focal deficit present.     Mental Status: She is alert and oriented to person, place, and time.     Gait: Gait normal.  Psychiatric:        Mood and Affect: Mood normal.        Behavior: Behavior normal.    BP 97/65   Pulse 79   Temp 97.9 F (36.6 C) (Temporal)   Ht $R'5\' 5"'SG$  (1.651 m)   Wt 275 lb 6.4 oz (124.9 kg)   LMP  (LMP Unknown)   SpO2 94%   BMI 45.83 kg/m  Wt Readings from Last 3 Encounters:  05/17/21 275 lb 6.4 oz (124.9 kg)  05/13/21 274 lb 12.8 oz (124.6 kg)  03/26/21 274 lb 12.8 oz (124.6 kg)     Health Maintenance Due  Topic Date Due   Zoster Vaccines- Shingrix (1 of 2) Never done   OPHTHALMOLOGY EXAM  10/02/2018   PNA vac Low Risk Adult (2 of 2 - PCV13) 12/18/2020   INFLUENZA VACCINE  04/22/2021   HEMOGLOBIN A1C  05/08/2021    There are no preventive care reminders to display for this patient.  Lab Results  Component Value Date   TSH 1.02 11/08/2020   Lab  Results  Component Value Date   WBC 8.1 11/08/2020   HGB 17.4 (H) 11/08/2020   HCT 52.3 (H) 11/08/2020   MCV 92.4 11/08/2020   PLT 179.0 11/08/2020   Lab Results  Component Value Date   NA 138 11/08/2020   K 4.1 11/08/2020   CO2 32 11/08/2020   GLUCOSE 153 (H) 11/08/2020   BUN 19 11/08/2020   CREATININE 1.32 (H) 11/08/2020   BILITOT 0.9 11/08/2020   ALKPHOS 98 11/08/2020   AST 13 11/08/2020   ALT 12 11/08/2020   PROT 7.4 11/08/2020   ALBUMIN 4.0 11/08/2020   CALCIUM 9.8 11/08/2020   ANIONGAP 9 02/16/2019   GFR 41.81 (L) 11/08/2020   Lab Results  Component Value Date   CHOL 124 04/26/2020   Lab Results  Component Value Date   HDL 42 (L) 04/26/2020   Lab Results  Component Value Date   LDLCALC 57 04/26/2020   Lab Results  Component Value Date   TRIG 175 (H) 04/26/2020   Lab Results  Component Value Date   CHOLHDL 3.0 04/26/2020   Lab Results  Component Value Date   HGBA1C 6.6 (H) 11/08/2020      Assessment & Plan:   Problem  List Items Addressed This Visit       Endocrine   Hyperlipidemia associated with type 2 diabetes mellitus ([place]), on Lipitor   Relevant Orders   Lipid panel   Hypothyroidism (acquired), stable on Levothyroxine     Genitourinary   CKD (chronic kidney disease) stage 3, GFR 30-59 ml/min (HCC)     Other   Morbid (severe) obesity due to excess calories (HCC)   Anxiety, uses prn Xanax   Polycythemia, secondary, followed by Heme, therapeutic phlebotomy   Relevant Orders   CBC with Differential/Platelet   Other Visit Diagnoses     Type 2 diabetes mellitus with other specified complication, without long-term current use of insulin ([place])    -  Primary   Relevant Orders   Comprehensive metabolic panel   Hemoglobin A1c   Microalbumin / creatinine urine ratio   Moderate persistent asthmatic bronchitis with acute exacerbation       No signs of bacterial infection. Okay promethazine with codeine to use prn for sleep.   Acquired  hypothyroidism       Relevant Orders   TSH   Hypertension associated with diabetes ([place])           No orders of the defined types were placed in this encounter.   Follow-up: No follow-ups on file.   1. Polycythemia, secondary, followed by Heme, therapeutic phlebotomy Lab Results  Component Value Date   WBC 8.1 11/08/2020   HGB 17.4 (H) 11/08/2020   HCT 52.3 (H) 11/08/2020   MCV 92.4 11/08/2020   PLT 179.0 11/08/2020   Patient states that she was told by hematology that she should donate blood, but she says she has not had this done yet.  We will repeat CBC next week to see what her numbers are doing.  2. Moderate persistent asthmatic bronchitis with acute exacerbation Currently stable.  Takes Symbicort and uses Ventolin as needed.  3. Anxiety, uses prn Xanax PDMP reviewed.  No red flags.  Updated her contract with me today.  4. Hypothyroidism (acquired), stable on Levothyroxine Stable on Synthroid 100 mcg.  Again we will update this lab next week.  5. Stage 3b chronic kidney disease ([place]) Advised her to avoid NSAIDs.  6. Hyperlipidemia associated with type 2 diabetes mellitus ([place]), on Lipitor Need to update her lipid panel.  She is currently taking atorvastatin 10 mg.  7. Acquired hypothyroidism Synthroid 100 mcg.  8. Hypertension associated with diabetes ([place]) Blood pressure is to goal.  She is taking Imdur 60 mg daily.  9. Morbid (severe) obesity due to excess calories ([place]) Wt Readings from Last 3 Encounters:  05/17/21 275 lb 6.4 oz (124.9 kg)  05/13/21 274 lb 12.8 oz (124.6 kg)  03/26/21 274 lb 12.8 oz (124.6 kg)   Encourage patient to work on weight loss.  We discussed mindless snacking and portion control today.  10. Type 2 diabetes mellitus with other specified complication, without long-term current use of insulin (HCC) Will update CMP and A1c next week.  She is taking Invokana 300 mg and Tradjenta 5 mg.    Hetvi Shawhan M Jaydis Duchene, PA-C

## 2021-05-20 ENCOUNTER — Other Ambulatory Visit (INDEPENDENT_AMBULATORY_CARE_PROVIDER_SITE_OTHER): Payer: Medicare HMO

## 2021-05-20 ENCOUNTER — Other Ambulatory Visit: Payer: Self-pay

## 2021-05-20 DIAGNOSIS — E785 Hyperlipidemia, unspecified: Secondary | ICD-10-CM | POA: Diagnosis not present

## 2021-05-20 DIAGNOSIS — E039 Hypothyroidism, unspecified: Secondary | ICD-10-CM | POA: Diagnosis not present

## 2021-05-20 DIAGNOSIS — D751 Secondary polycythemia: Secondary | ICD-10-CM | POA: Diagnosis not present

## 2021-05-20 DIAGNOSIS — E1169 Type 2 diabetes mellitus with other specified complication: Secondary | ICD-10-CM

## 2021-05-20 LAB — CBC WITH DIFFERENTIAL/PLATELET
Basophils Absolute: 0 10*3/uL (ref 0.0–0.1)
Basophils Relative: 0.7 % (ref 0.0–3.0)
Eosinophils Absolute: 0.1 10*3/uL (ref 0.0–0.7)
Eosinophils Relative: 2 % (ref 0.0–5.0)
HCT: 50.2 % — ABNORMAL HIGH (ref 36.0–46.0)
Hemoglobin: 16.6 g/dL — ABNORMAL HIGH (ref 12.0–15.0)
Lymphocytes Relative: 28.5 % (ref 12.0–46.0)
Lymphs Abs: 2 10*3/uL (ref 0.7–4.0)
MCHC: 33 g/dL (ref 30.0–36.0)
MCV: 94.4 fl (ref 78.0–100.0)
Monocytes Absolute: 0.5 10*3/uL (ref 0.1–1.0)
Monocytes Relative: 6.8 % (ref 3.0–12.0)
Neutro Abs: 4.3 10*3/uL (ref 1.4–7.7)
Neutrophils Relative %: 62 % (ref 43.0–77.0)
Platelets: 153 10*3/uL (ref 150.0–400.0)
RBC: 5.31 Mil/uL — ABNORMAL HIGH (ref 3.87–5.11)
RDW: 15.6 % — ABNORMAL HIGH (ref 11.5–15.5)
WBC: 7 10*3/uL (ref 4.0–10.5)

## 2021-05-20 LAB — TSH: TSH: 3.43 u[IU]/mL (ref 0.35–5.50)

## 2021-05-20 LAB — COMPREHENSIVE METABOLIC PANEL
ALT: 13 U/L (ref 0–35)
AST: 15 U/L (ref 0–37)
Albumin: 3.8 g/dL (ref 3.5–5.2)
Alkaline Phosphatase: 80 U/L (ref 39–117)
BUN: 27 mg/dL — ABNORMAL HIGH (ref 6–23)
CO2: 28 mEq/L (ref 19–32)
Calcium: 9.6 mg/dL (ref 8.4–10.5)
Chloride: 103 mEq/L (ref 96–112)
Creatinine, Ser: 1.4 mg/dL — ABNORMAL HIGH (ref 0.40–1.20)
GFR: 38.82 mL/min — ABNORMAL LOW (ref >60.00)
Glucose, Bld: 89 mg/dL (ref 70–99)
Potassium: 3.9 mEq/L (ref 3.5–5.1)
Sodium: 141 mEq/L (ref 135–145)
Total Bilirubin: 0.8 mg/dL (ref 0.2–1.2)
Total Protein: 6.9 g/dL (ref 6.0–8.3)

## 2021-05-20 LAB — LIPID PANEL
Cholesterol: 130 mg/dL (ref 0–200)
HDL: 42 mg/dL (ref >39.00)
LDL Cholesterol: 58 mg/dL (ref 0–99)
NonHDL: 88.47
Total CHOL/HDL Ratio: 3
Triglycerides: 152 mg/dL — ABNORMAL HIGH (ref 0.0–149.0)
VLDL: 30.4 mg/dL (ref 0.0–40.0)

## 2021-05-20 LAB — MICROALBUMIN / CREATININE URINE RATIO
Creatinine,U: 147.4 mg/dL
Microalb Creat Ratio: 0.5 mg/g (ref 0.0–30.0)
Microalb, Ur: <0.7 mg/dL (ref 0.0–1.9)

## 2021-05-20 LAB — HEMOGLOBIN A1C: Hgb A1c MFr Bld: 6.6 % — ABNORMAL HIGH (ref 4.6–6.5)

## 2021-05-21 ENCOUNTER — Encounter: Payer: Medicare HMO | Admitting: Physician Assistant

## 2021-05-23 ENCOUNTER — Other Ambulatory Visit: Payer: Self-pay

## 2021-05-23 ENCOUNTER — Other Ambulatory Visit: Payer: Self-pay | Admitting: Family Medicine

## 2021-05-23 ENCOUNTER — Telehealth: Payer: Self-pay

## 2021-05-23 DIAGNOSIS — J4541 Moderate persistent asthma with (acute) exacerbation: Secondary | ICD-10-CM

## 2021-05-23 DIAGNOSIS — F419 Anxiety disorder, unspecified: Secondary | ICD-10-CM

## 2021-05-23 NOTE — Telephone Encounter (Signed)
LAST APPOINTMENT DATE:  05/17/21  NEXT APPOINTMENT DATE: 09/04/21  MEDICATION:budesonide-formoterol (SYMBICORT) 80-4.5 MCG/ACT inhaler  ALPRAZolam (XANAX) 0.5 MG tablet  meloxicam (MOBIC) 7.5 MG tablet  canagliflozin (INVOKANA) 300 MG TABS tablet  PHARMACY:[facility] [id number] - [place], [place] - [address] AT [address]

## 2021-05-23 NOTE — Telephone Encounter (Signed)
Pt requesting refills.

## 2021-05-24 MED ORDER — CANAGLIFLOZIN 300 MG PO TABS: ORAL_TABLET | ORAL | 2 refills | Status: DC

## 2021-05-24 MED ORDER — MELOXICAM 7.5 MG PO TABS
7.5000 mg | ORAL_TABLET | Freq: Every day | ORAL | 0 refills | Status: DC
Start: 2021-05-24 — End: 2021-06-26

## 2021-05-24 MED ORDER — BUDESONIDE-FORMOTEROL FUMARATE 80-4.5 MCG/ACT IN AERO: 2.0000 | INHALATION_SPRAY | Freq: Two times a day (BID) | RESPIRATORY_TRACT | 2 refills | Status: DC

## 2021-05-24 NOTE — Telephone Encounter (Signed)
Left message on voicemail Rx's were sent to pharmacy as requested. Any questions please call office.

## 2021-05-31 ENCOUNTER — Telehealth: Payer: Self-pay

## 2021-06-03 ENCOUNTER — Other Ambulatory Visit: Payer: Self-pay

## 2021-06-03 DIAGNOSIS — J4541 Moderate persistent asthma with (acute) exacerbation: Secondary | ICD-10-CM

## 2021-06-03 MED ORDER — IPRATROPIUM BROMIDE 0.06 % NA SOLN
NASAL | 1 refills | Status: DC
Start: 2021-06-03 — End: 2021-06-26

## 2021-06-03 NOTE — Telephone Encounter (Signed)
Rx sent in

## 2021-06-07 ENCOUNTER — Other Ambulatory Visit: Payer: Self-pay

## 2021-06-07 ENCOUNTER — Telehealth: Payer: Self-pay

## 2021-06-07 MED ORDER — BUDESONIDE-FORMOTEROL FUMARATE 80-4.5 MCG/ACT IN AERO: 2.0000 | INHALATION_SPRAY | Freq: Two times a day (BID) | RESPIRATORY_TRACT | 0 refills | Status: DC

## 2021-06-07 NOTE — Telephone Encounter (Signed)
Patient is requesting a call back.  Would like to know if she could start getting 3 month scripts on symbicort.

## 2021-06-07 NOTE — Telephone Encounter (Signed)
Rx sent in

## 2021-06-26 ENCOUNTER — Other Ambulatory Visit: Payer: Self-pay

## 2021-06-26 ENCOUNTER — Telehealth: Payer: Self-pay

## 2021-06-26 DIAGNOSIS — J4541 Moderate persistent asthma with (acute) exacerbation: Secondary | ICD-10-CM

## 2021-06-26 MED ORDER — IPRATROPIUM BROMIDE 0.06 % NA SOLN: NASAL | 1 refills | Status: DC

## 2021-06-26 MED ORDER — MELOXICAM 7.5 MG PO TABS: 7.5000 mg | ORAL_TABLET | Freq: Every day | ORAL | 0 refills | Status: DC

## 2021-06-26 NOTE — Telephone Encounter (Signed)
Rx sent in

## 2021-06-26 NOTE — Telephone Encounter (Signed)
LAST APPOINTMENT DATE:  05/17/21  NEXT APPOINTMENT DATE: 09/04/21  MEDICATION:meloxicam (MOBIC) 7.5 MG tablet  ipratropium (ATROVENT) 0.06 % nasal spray  PHARMACY: [id number] - [place], [place] - [address] AT [address]

## 2021-07-15 ENCOUNTER — Other Ambulatory Visit: Payer: Self-pay | Admitting: Family Medicine

## 2021-07-15 ENCOUNTER — Other Ambulatory Visit: Payer: Self-pay | Admitting: Cardiovascular Disease

## 2021-07-16 ENCOUNTER — Telehealth: Payer: Self-pay

## 2021-07-16 ENCOUNTER — Other Ambulatory Visit: Payer: Self-pay

## 2021-07-16 ENCOUNTER — Ambulatory Visit (INDEPENDENT_AMBULATORY_CARE_PROVIDER_SITE_OTHER): Payer: Medicare HMO

## 2021-07-16 DIAGNOSIS — Z23 Encounter for immunization: Secondary | ICD-10-CM | POA: Diagnosis not present

## 2021-07-16 NOTE — Telephone Encounter (Signed)
Patient came in for a flu shot today, and advised me that she has been wheezing for the past 2 weeks or so. Has been using both of her inhalers, and her nebulizer solution with very little relief. Would like to know how often she can use her inhalers and nebulizer or if she needs an OV to discuss making changes to her medications. Let her know I will send a note to her PCP and someone will get back with her. She gave a verbal understanding, and said tomorrow would be a better time for someone to call her regarding this matter.

## 2021-07-16 NOTE — Telephone Encounter (Signed)
Noted I will follow up with patient 07/17/21

## 2021-07-18 NOTE — Telephone Encounter (Signed)
Pt called again today to follow up on her concern. She is still wheezing, no cough, but she force her self to cough and she gets thick clear phlegm. No fever. Advised pt that it's better to schedule a visit with a provider to be evaluated. Appointment scheduled for tomorrow at 12:30 with Alyssa.

## 2021-07-19 ENCOUNTER — Encounter: Payer: Self-pay | Admitting: Physician Assistant

## 2021-07-19 ENCOUNTER — Ambulatory Visit (INDEPENDENT_AMBULATORY_CARE_PROVIDER_SITE_OTHER): Payer: Medicare HMO | Admitting: Physician Assistant

## 2021-07-19 ENCOUNTER — Other Ambulatory Visit: Payer: Self-pay

## 2021-07-19 VITALS — BP 113/68 | HR 80 | Temp 98.9°F | Ht 65.0 in | Wt 279.2 lb

## 2021-07-19 DIAGNOSIS — J440 Chronic obstructive pulmonary disease with acute lower respiratory infection: Secondary | ICD-10-CM

## 2021-07-19 DIAGNOSIS — F17210 Nicotine dependence, cigarettes, uncomplicated: Secondary | ICD-10-CM

## 2021-07-19 DIAGNOSIS — J209 Acute bronchitis, unspecified: Secondary | ICD-10-CM | POA: Diagnosis not present

## 2021-07-19 DIAGNOSIS — R918 Other nonspecific abnormal finding of lung field: Secondary | ICD-10-CM

## 2021-07-19 MED ORDER — PANTOPRAZOLE SODIUM 40 MG PO TBEC: 40.0000 mg | DELAYED_RELEASE_TABLET | Freq: Every day | ORAL | 1 refills | Status: DC

## 2021-07-19 MED ORDER — CEFUROXIME AXETIL 500 MG PO TABS: 500.0000 mg | ORAL_TABLET | Freq: Two times a day (BID) | ORAL | 0 refills | Status: AC

## 2021-07-19 MED ORDER — MELOXICAM 7.5 MG PO TABS: 7.5000 mg | ORAL_TABLET | Freq: Every day | ORAL | 1 refills | Status: AC

## 2021-07-19 MED ORDER — PREDNISONE 20 MG PO TABS: 20.0000 mg | ORAL_TABLET | Freq: Two times a day (BID) | ORAL | 0 refills | Status: AC

## 2021-07-19 NOTE — Progress Notes (Signed)
Subjective:    Patient ID: [name] [name], female    DOB: 07-24-1951, 70 y.o.   MRN: [id number]  Chief Complaint  Patient presents with   Wheezing    Wheezing   Patient is in today for wheezing x 2 weeks, worse in the last week. Uses nebulizer in morning and night. Also coughing up thick clear phlegm. States she doesn't feel bad though. Pt states she has hx of "borderline COPD." She has Symbicort that helps. She feels like her breathing is worse with exertion. No chest pain or pressure at all. No swelling in legs. She sleeps on her right side at night. Still smoking.  She denies any fever or chills.  No recent sickness.  Past Medical History:  Diagnosis Date   Bronchitis, asthmatic    Coronary artery disease    BMS to LAD 02/2008   Diabetes mellitus without complication ([place])    Hyperlipidemia    Hypertensive heart disease 04/16/2016   Hypothyroidism    MI, old    Morbid obesity ([place]) 04/16/2016   S/P coronary artery stent placement 04/16/2016   BMS to LAD 2009   Tobacco abuse 04/16/2016    Past Surgical History:  Procedure Laterality Date   ABDOMINAL HYSTERECTOMY     ANKLE SURGERY Right    CHOLECYSTECTOMY     CORONARY ANGIOPLASTY  02/2008   bare metal stent to LAD    CORONARY STENT PLACEMENT     KNEE ARTHROSCOPY     ROTATOR CUFF REPAIR Bilateral    THORACIC OUTLET SURGERY     WRIST SURGERY      Family History  Problem Relation Age of Onset   Cancer Mother    Diabetes Son    Hypertension Son     Social History   Tobacco Use   Smoking status: Light Smoker    Packs/day: 0.50    Years: 31.00    Pack years: 15.50    Types: Cigarettes   Smokeless tobacco: Never  Vaping Use   Vaping Use: Never used  Substance Use Topics   Alcohol use: No   Drug use: No     Allergies  Allergen Reactions   Clindamycin/Lincomycin Other (See Comments)    Head ache    Codeine Hives   Cortisone    Dilaudid [Hydromorphone Hcl] Nausea And Vomiting   Iodides    Reglan  [Metoclopramide] Other (See Comments)    Other reaction(s): Dystonia Hallucinations/Violent   Tramadol    Tape Rash    Blisters     Review of Systems  Respiratory:  Positive for wheezing.   REFER TO HPI FOR PERTINENT POSITIVES AND NEGATIVES      Objective:     BP 113/68   Pulse 80   Temp 98.9 F (37.2 C)   Ht 5\' 5"  (1.651 m)   Wt 279 lb 3.2 oz (126.6 kg)   LMP  (LMP Unknown)   SpO2 96%   BMI 46.46 kg/m   Wt Readings from Last 3 Encounters:  07/19/21 279 lb 3.2 oz (126.6 kg)  05/17/21 275 lb 6.4 oz (124.9 kg)  05/13/21 274 lb 12.8 oz (124.6 kg)    BP Readings from Last 3 Encounters:  07/19/21 113/68  05/17/21 97/65  05/13/21 118/64     Physical Exam Vitals and nursing note reviewed.  Constitutional:      General: She is not in acute distress.    Appearance: Normal appearance. She is obese.  HENT:     Head: Normocephalic.  Right Ear: External ear normal.     Left Ear: External ear normal.     Nose: Nose normal.     Mouth/Throat:     Mouth: Mucous membranes are moist.  Eyes:     Extraocular Movements: Extraocular movements intact.     Conjunctiva/sclera: Conjunctivae normal.     Pupils: Pupils are equal, round, and reactive to light.  Cardiovascular:     Rate and Rhythm: Normal rate and regular rhythm.     Pulses: Normal pulses.     Heart sounds: No murmur heard. Pulmonary:     Effort: Pulmonary effort is normal.     Breath sounds: Wheezing (worse in R > L) present.  Chest:     Chest wall: No tenderness.  Abdominal:     General: Abdomen is flat. Bowel sounds are normal.     Palpations: Abdomen is soft.     Tenderness: There is no abdominal tenderness.  Musculoskeletal:        General: Normal range of motion.     Cervical back: Normal range of motion.     Right lower leg: No edema.     Left lower leg: No edema.  Skin:    General: Skin is warm.  Neurological:     General: No focal deficit present.     Mental Status: She is alert and oriented  to person, place, and time.     Gait: Gait normal.  Psychiatric:        Mood and Affect: Mood normal.        Behavior: Behavior normal.       Assessment & Plan:   Problem List Items Addressed This Visit       Other   Nicotine dependence, cigarrettes, precontemplative   Other Visit Diagnoses     Acute bronchitis with COPD ([place])    -  Primary   Relevant Medications   predniSONE (DELTASONE) 20 MG tablet   Pulmonary nodules       Relevant Orders   Ambulatory Referral Lung Cancer Screening [facility]        Meds ordered this encounter  Medications   predniSONE (DELTASONE) 20 MG tablet    Sig: Take 1 tablet (20 mg total) by mouth 2 (two) times daily with a meal for 5 days.    Dispense:  10 tablet    Refill:  0   cefUROXime (CEFTIN) 500 MG tablet    Sig: Take 1 tablet (500 mg total) by mouth 2 (two) times daily with a meal for 7 days.    Dispense:  14 tablet    Refill:  0   meloxicam (MOBIC) 7.5 MG tablet    Sig: Take 1 tablet (7.5 mg total) by mouth daily.    Dispense:  90 tablet    Refill:  1   pantoprazole (PROTONIX) 40 MG tablet    Sig: Take 1 tablet (40 mg total) by mouth daily.    Dispense:  90 tablet    Refill:  1    -I think that the patient is having an exacerbation of her COPD.  Her oxygen saturation is normal in office.  She is not in any acute respiratory distress.  I am going to treat her with prednisone and cefuroxime as directed.  She will continue to use her breathing treatments at home.  She knows to go to the emergency department in the event of any acute worsening symptoms. -I have also reviewed her CT scan from 02/15/2019 and there  are multiple pulmonary nodules noted that patient has not followed up on.  She is still considered at high risk because of her history of smoking and I have sent a referral to the pulmonary clinic to evaluate this further for her and proceed with repeat CT testing if indicated. -Patient also requested refills on her  Protonix and meloxicam for 90 days and I agreed to that today.   This note was prepared with assistance of Systems analyst. Occasional wrong-word or sound-a-like substitutions may have occurred due to the inherent limitations of voice recognition software.  Time Spent: 21 minutes of total time was spent on the date of the encounter performing the following actions: chart review prior to seeing the patient, obtaining history, performing a medically necessary exam, counseling on the treatment plan, placing orders, and documenting in our EHR.    Rolfe Hartsell M Gopal Malter, PA-C

## 2021-07-30 ENCOUNTER — Other Ambulatory Visit: Payer: Self-pay

## 2021-07-30 ENCOUNTER — Other Ambulatory Visit: Payer: Self-pay | Admitting: Family Medicine

## 2021-07-30 ENCOUNTER — Telehealth: Payer: Self-pay

## 2021-07-30 MED ORDER — LEVOTHYROXINE SODIUM 100 MCG PO TABS: 100.0000 ug | ORAL_TABLET | Freq: Every day | ORAL | 1 refills | Status: DC

## 2021-07-30 NOTE — Telephone Encounter (Signed)
Rx sent in

## 2021-07-30 NOTE — Telephone Encounter (Signed)
LAST APPOINTMENT DATE: 07/19/21  NEXT APPOINTMENT DATE: 09/04/21  MEDICATION: meloxicam (MOBIC) 7.5 MG tablet  levothyroxine (SYNTHROID) 100 MCG tablet  PHARMACY: [facility] [place], [place] - [address]

## 2021-07-31 ENCOUNTER — Institutional Professional Consult (permissible substitution): Payer: Medicare HMO | Admitting: Emergency Medicine

## 2021-08-09 ENCOUNTER — Institutional Professional Consult (permissible substitution): Payer: Medicare HMO | Admitting: Emergency Medicine

## 2021-08-11 ENCOUNTER — Other Ambulatory Visit: Payer: Self-pay | Admitting: Family Medicine

## 2021-08-13 ENCOUNTER — Other Ambulatory Visit: Payer: Self-pay

## 2021-08-13 ENCOUNTER — Telehealth: Payer: Self-pay

## 2021-08-13 MED ORDER — BUDESONIDE-FORMOTEROL FUMARATE 80-4.5 MCG/ACT IN AERO: 2.0000 | INHALATION_SPRAY | Freq: Two times a day (BID) | RESPIRATORY_TRACT | 0 refills | Status: DC

## 2021-08-13 NOTE — Telephone Encounter (Signed)
..   Encourage patient to contact the pharmacy for refills or they can request refills through [place]:  07/19/2021  NEXT APPOINTMENT DATE:  09/04/2021  MEDICATION:  symbicort   Is the patient out of medication?  Out 11/23  PHARMACY:  [facility] on [address]  Let patient know to contact pharmacy at the end of the day to make sure medication is ready.  Please notify patient to allow 48-72 hours to process  CLINICAL FILLS OUT ALL BELOW:   LAST REFILL:  QTY:  REFILL DATE:    OTHER COMMENTS:  States script needs to be 30 days with refills for ins purposes.  Okay for refill?  Please advise    ref

## 2021-08-13 NOTE — Telephone Encounter (Signed)
Rx sent in

## 2021-08-27 ENCOUNTER — Telehealth: Payer: Self-pay

## 2021-08-27 NOTE — Telephone Encounter (Signed)
Rx sent in patient aware 

## 2021-08-27 NOTE — Telephone Encounter (Signed)
..   Encourage patient to contact the pharmacy for refills or they can request refills through [place]: 07/19/2021  NEXT APPOINTMENT DATE: 09/04/2021  MEDICATION:  Tradjenta 5mg   Is the patient out of medication? Yes, as of today  PHARMACY: [facility]  Let patient know to contact pharmacy at the end of the day to make sure medication is ready.  Please notify patient to allow 48-72 hours to process  CLINICAL FILLS OUT ALL BELOW:   LAST REFILL:  QTY:  REFILL DATE:    OTHER COMMENTS:    Okay for refill?  Please advise

## 2021-09-04 ENCOUNTER — Ambulatory Visit: Payer: Medicare HMO | Admitting: Physician Assistant

## 2021-09-09 ENCOUNTER — Other Ambulatory Visit: Payer: Self-pay

## 2021-09-09 MED ORDER — BUDESONIDE-FORMOTEROL FUMARATE 80-4.5 MCG/ACT IN AERO: 2.0000 | INHALATION_SPRAY | Freq: Two times a day (BID) | RESPIRATORY_TRACT | 0 refills | Status: DC

## 2021-09-11 ENCOUNTER — Ambulatory Visit (INDEPENDENT_AMBULATORY_CARE_PROVIDER_SITE_OTHER): Payer: Medicare HMO | Admitting: Physician Assistant

## 2021-09-11 ENCOUNTER — Other Ambulatory Visit: Payer: Self-pay

## 2021-09-11 ENCOUNTER — Encounter: Payer: Self-pay | Admitting: Physician Assistant

## 2021-09-11 VITALS — BP 111/69 | HR 74 | Temp 97.6°F | Ht 65.0 in | Wt 279.2 lb

## 2021-09-11 DIAGNOSIS — F419 Anxiety disorder, unspecified: Secondary | ICD-10-CM

## 2021-09-11 DIAGNOSIS — J449 Chronic obstructive pulmonary disease, unspecified: Secondary | ICD-10-CM

## 2021-09-11 DIAGNOSIS — E1169 Type 2 diabetes mellitus with other specified complication: Secondary | ICD-10-CM

## 2021-09-11 DIAGNOSIS — L719 Rosacea, unspecified: Secondary | ICD-10-CM

## 2021-09-11 DIAGNOSIS — R051 Acute cough: Secondary | ICD-10-CM | POA: Diagnosis not present

## 2021-09-11 DIAGNOSIS — F17210 Nicotine dependence, cigarettes, uncomplicated: Secondary | ICD-10-CM

## 2021-09-11 LAB — POCT GLYCOSYLATED HEMOGLOBIN (HGB A1C): Hemoglobin A1C: 6.5 % — AB (ref 4.0–5.6)

## 2021-09-11 MED ORDER — PROMETHAZINE-DM 6.25-15 MG/5ML PO SYRP: 5.0000 mL | ORAL_SOLUTION | Freq: Every evening | ORAL | 0 refills | Status: DC | PRN

## 2021-09-11 NOTE — Patient Instructions (Signed)
Good to see you again!  Your hemoglobin A1c is 6.5, very stable for you. Good job! So proud of you cutting back on smoking!! Keep going!   I will get back to you when I talk with specialist about your nose - it may be part of the rosacea.   I also sent cough syrup to help in the evenings.   See you back in 6 months. Call sooner if any concerns!

## 2021-09-11 NOTE — Progress Notes (Signed)
Subjective:    Patient ID: [name] [name], female    DOB: 07-20-1951, 70 y.o.   MRN: [id number]  Chief Complaint  Patient presents with   Follow-up    HPI Patient is in today for recheck. No new concerns other than lingering cough keeping her awake at night since her bout with bronchitis this Fall. She has an appt with pulmonology in January for f/up lung nodules, ongoing cough. She is only smoking 5 cigs / day currently & trying to quit on her own.  Sugar readings have been in 120s-140s at home. Limited on exercise 2/2 sciatica. Says she tries to eat well-balanced.  Taking her medications as prescribed. No new concerns.   Past Medical History:  Diagnosis Date   Bronchitis, asthmatic    Coronary artery disease    BMS to LAD 02/2008   Diabetes mellitus without complication ([place])    Hyperlipidemia    Hypertensive heart disease 04/16/2016   Hypothyroidism    MI, old    Morbid obesity ([place]) 04/16/2016   S/P coronary artery stent placement 04/16/2016   BMS to LAD 2009   Tobacco abuse 04/16/2016    Past Surgical History:  Procedure Laterality Date   ABDOMINAL HYSTERECTOMY     ANKLE SURGERY Right    CHOLECYSTECTOMY     CORONARY ANGIOPLASTY  02/2008   bare metal stent to LAD    CORONARY STENT PLACEMENT     KNEE ARTHROSCOPY     ROTATOR CUFF REPAIR Bilateral    THORACIC OUTLET SURGERY     WRIST SURGERY      Family History  Problem Relation Age of Onset   Cancer Mother    Diabetes Son    Hypertension Son     Social History   Tobacco Use   Smoking status: Light Smoker    Packs/day: 0.50    Years: 31.00    Pack years: 15.50    Types: Cigarettes   Smokeless tobacco: Never  Vaping Use   Vaping Use: Never used  Substance Use Topics   Alcohol use: No   Drug use: No     Allergies  Allergen Reactions   Clindamycin/Lincomycin Other (See Comments)    Head ache    Codeine Hives   Cortisone    Dilaudid [Hydromorphone Hcl] Nausea And Vomiting   Iodides    Reglan  [Metoclopramide] Other (See Comments)    Other reaction(s): Dystonia Hallucinations/Violent   Tramadol    Tape Rash    Blisters     Review of Systems NEGATIVE UNLESS OTHERWISE INDICATED IN HPI      Objective:     BP 111/69    Pulse 74    Temp 97.6 F (36.4 C)    Ht 5\' 5"  (1.651 m)    Wt 279 lb 3.2 oz (126.6 kg)    LMP  (LMP Unknown)    SpO2 97%    BMI 46.46 kg/m   Wt Readings from Last 3 Encounters:  09/11/21 279 lb 3.2 oz (126.6 kg)  07/19/21 279 lb 3.2 oz (126.6 kg)  05/17/21 275 lb 6.4 oz (124.9 kg)    BP Readings from Last 3 Encounters:  09/11/21 111/69  07/19/21 113/68  05/17/21 97/65     Physical Exam Vitals and nursing note reviewed.  Constitutional:      General: She is not in acute distress.    Appearance: Normal appearance. She is obese.  HENT:     Head: Normocephalic.     Right Ear:  External ear normal.     Left Ear: External ear normal.     Nose: Nose normal.     Mouth/Throat:     Mouth: Mucous membranes are moist.  Eyes:     Extraocular Movements: Extraocular movements intact.     Conjunctiva/sclera: Conjunctivae normal.     Pupils: Pupils are equal, round, and reactive to light.  Cardiovascular:     Rate and Rhythm: Normal rate and regular rhythm.     Pulses: Normal pulses.     Heart sounds: No murmur heard. Pulmonary:     Effort: Pulmonary effort is normal.     Breath sounds: Normal breath sounds.  Abdominal:     General: Abdomen is flat. Bowel sounds are normal.     Palpations: Abdomen is soft.     Tenderness: There is no abdominal tenderness.  Musculoskeletal:        General: Normal range of motion.     Cervical back: Normal range of motion.  Skin:    General: Skin is warm.     Comments: See photo below of nose - blue-hue in color; cap refill normal and sensation normal  Neurological:     General: No focal deficit present.     Mental Status: She is alert and oriented to person, place, and time.     Gait: Gait normal.  Psychiatric:         Mood and Affect: Mood normal.        Behavior: Behavior normal.        Assessment & Plan:   Problem List Items Addressed This Visit       Respiratory   COPD GOLD 0/ prob AB still smoking    Relevant Medications   promethazine-dextromethorphan (PROMETHAZINE-DM) 6.25-15 MG/5ML syrup     Musculoskeletal and Integument   Rosacea     Other   Nicotine dependence, cigarrettes, precontemplative   Morbid (severe) obesity due to excess calories (HCC)   Anxiety, uses prn Xanax   Other Visit Diagnoses     Type 2 diabetes mellitus with other specified complication, without long-term current use of insulin (HCC)    -  Primary   Relevant Orders   POCT HgB A1C   Acute cough            Meds ordered this encounter  Medications   promethazine-dextromethorphan (PROMETHAZINE-DM) 6.25-15 MG/5ML syrup    Sig: Take 5 mLs by mouth at bedtime as needed for cough.    Dispense:  120 mL    Refill:  0    1. Type 2 diabetes mellitus with other specified complication, without long-term current use of insulin (HCC) -Stable -POCT HbA1c is 6.5 today -Encouraged continued work on diet / exercise as able  2. Cigarette nicotine dependence without complication -Actively trying to quit on her own currently  3. Anxiety, uses prn Xanax PDMP reviewed today, no red flags, filling appropriately.  Will call for refills   4. Morbid (severe) obesity due to excess calories ([place]) -Encouraged more movement as able  5. COPD GOLD 0/ prob AB still smoking  6. Acute cough -F/up with pulm next month -Promethazine cough syrup at bedtime  7. Rosacea -Blue-hue to nose ?part of Rosacea. I will reach out to derm specialist and get back to her.    Mirielle Byrum M Aniayah Alaniz, PA-C

## 2021-09-25 ENCOUNTER — Institutional Professional Consult (permissible substitution): Payer: Medicare HMO | Admitting: Emergency Medicine

## 2021-10-01 ENCOUNTER — Ambulatory Visit (INDEPENDENT_AMBULATORY_CARE_PROVIDER_SITE_OTHER): Payer: Medicare HMO | Admitting: Physician Assistant

## 2021-10-01 ENCOUNTER — Encounter: Payer: Self-pay | Admitting: Physician Assistant

## 2021-10-01 VITALS — BP 143/78 | HR 81 | Temp 98.0°F | Ht 65.0 in | Wt 282.2 lb

## 2021-10-01 DIAGNOSIS — J4541 Moderate persistent asthma with (acute) exacerbation: Secondary | ICD-10-CM

## 2021-10-01 DIAGNOSIS — Z2089 Contact with and (suspected) exposure to other communicable diseases: Secondary | ICD-10-CM | POA: Diagnosis not present

## 2021-10-01 MED ORDER — PROMETHAZINE-DM 6.25-15 MG/5ML PO SYRP: 5.0000 mL | ORAL_SOLUTION | Freq: Every evening | ORAL | 0 refills | Status: DC | PRN

## 2021-10-01 MED ORDER — PREDNISONE 5 MG PO TABS: ORAL_TABLET | ORAL | 0 refills | Status: DC

## 2021-10-01 NOTE — Progress Notes (Signed)
Subjective:    Patient ID: [name] [name], female    DOB: 03-Sep-1951, 71 y.o.   MRN: [id number]  Chief Complaint  Patient presents with   Cough    Wheezing x 2 weeks    Cough  Patient is in today for cough and wheezing x 2 weeks. Cough productive of clear phlegm. She has been using rescue inhaler and nebulizer more often than normal. Alka-Seltzer cough and cold has helped her quite a bit as well. Uses Symbicort twice per day. She is feeling better than last week. Some SOB with ambulation, but not gasping for air. No chest pain. Admits she needs to work on weight loss still. No fever / chills or weakness. No body aches. Husband treated for pneumonia last week.   Past Medical History:  Diagnosis Date   Bronchitis, asthmatic    Coronary artery disease    BMS to LAD 02/2008   Diabetes mellitus without complication ([place])    Hyperlipidemia    Hypertensive heart disease 04/16/2016   Hypothyroidism    MI, old    Morbid obesity ([place]) 04/16/2016   S/P coronary artery stent placement 04/16/2016   BMS to LAD 2009   Tobacco abuse 04/16/2016    Past Surgical History:  Procedure Laterality Date   ABDOMINAL HYSTERECTOMY     ANKLE SURGERY Right    CHOLECYSTECTOMY     CORONARY ANGIOPLASTY  02/2008   bare metal stent to LAD    CORONARY STENT PLACEMENT     KNEE ARTHROSCOPY     ROTATOR CUFF REPAIR Bilateral    THORACIC OUTLET SURGERY     WRIST SURGERY      Family History  Problem Relation Age of Onset   Cancer Mother    Diabetes Son    Hypertension Son     Social History   Tobacco Use   Smoking status: Light Smoker    Packs/day: 0.50    Years: 31.00    Pack years: 15.50    Types: Cigarettes   Smokeless tobacco: Never  Vaping Use   Vaping Use: Never used  Substance Use Topics   Alcohol use: No   Drug use: No     Allergies  Allergen Reactions   Clindamycin/Lincomycin Other (See Comments)    Head ache    Codeine Hives   Cortisone    Dilaudid [Hydromorphone Hcl] Nausea And  Vomiting   Iodides    Reglan [Metoclopramide] Other (See Comments)    Other reaction(s): Dystonia Hallucinations/Violent   Tramadol    Tape Rash    Blisters     Review of Systems  Respiratory:  Positive for cough.   NEGATIVE UNLESS OTHERWISE INDICATED IN HPI      Objective:     BP (!) 143/78    Pulse 81    Temp 98 F (36.7 C)    Ht 5\' 5"  (1.651 m)    Wt 282 lb 3.2 oz (128 kg)    LMP  (LMP Unknown)    SpO2 94%    BMI 46.96 kg/m   Wt Readings from Last 3 Encounters:  10/01/21 282 lb 3.2 oz (128 kg)  09/11/21 279 lb 3.2 oz (126.6 kg)  07/19/21 279 lb 3.2 oz (126.6 kg)    BP Readings from Last 3 Encounters:  10/01/21 (!) 143/78  09/11/21 111/69  07/19/21 113/68     Physical Exam Vitals and nursing note reviewed.  Constitutional:      General: She is not in acute distress.  Appearance: Normal appearance. She is obese.  HENT:     Head: Normocephalic.     Right Ear: External ear normal.     Left Ear: External ear normal.     Nose: Nose normal.     Mouth/Throat:     Mouth: Mucous membranes are moist.  Eyes:     Extraocular Movements: Extraocular movements intact.     Conjunctiva/sclera: Conjunctivae normal.     Pupils: Pupils are equal, round, and reactive to light.  Cardiovascular:     Rate and Rhythm: Normal rate and regular rhythm.     Pulses: Normal pulses.     Heart sounds: No murmur heard. Pulmonary:     Effort: Pulmonary effort is normal.     Comments: Faint scattered wheeze otherwise lungs CTAB Musculoskeletal:        General: Normal range of motion.     Cervical back: Normal range of motion.     Right lower leg: No edema.     Left lower leg: No edema.  Skin:    General: Skin is warm.  Neurological:     General: No focal deficit present.     Mental Status: She is alert and oriented to person, place, and time.     Gait: Gait normal.  Psychiatric:        Mood and Affect: Mood normal.        Behavior: Behavior normal.       Assessment & Plan:    Problem List Items Addressed This Visit   None Visit Diagnoses     Moderate persistent asthmatic bronchitis with acute exacerbation    -  Primary   Relevant Medications   predniSONE (DELTASONE) 5 MG tablet   Other Relevant Orders   DG Chest 2 View   Exposure to pneumonia            Meds ordered this encounter  Medications   predniSONE (DELTASONE) 5 MG tablet    Sig: 6-5-4-3-2-1    Dispense:  21 tablet    Refill:  0   promethazine-dextromethorphan (PROMETHAZINE-DM) 6.25-15 MG/5ML syrup    Sig: Take 5 mLs by mouth at bedtime as needed for cough.    Dispense:  120 mL    Refill:  0   Plan: -Glad she is seeing some improvement in her symptoms. I do want her to get a CXR to r/o outpatient pneumonia. Pending XRAY may determine need for or against antibiotics. -Will treat with Prednisone taper 5 mg tablets as discussed - she has done well in the past with this regimen and denies having hyperglycemia with this regimen. -She will cont her nebs every 4-6 hours prn, as well as Symbicort. -Rx for promethazine DM to help with cough -Fluids, humidifier, nasal saline all advised -return precautions advised  This note was prepared with assistance of Dragon voice recognition software. Occasional wrong-word or sound-a-like substitutions may have occurred due to the inherent limitations of voice recognition software.  Time Spent: 27 minutes of total time was spent on the date of the encounter performing the following actions: chart review prior to seeing the patient, obtaining history, performing a medically necessary exam, counseling on the treatment plan, placing orders, and documenting in our EHR.    Jamaree Hosier M Nathin Saran, PA-C

## 2021-10-21 ENCOUNTER — Other Ambulatory Visit: Payer: Self-pay

## 2021-10-21 ENCOUNTER — Telehealth: Payer: Self-pay

## 2021-10-21 MED ORDER — PANTOPRAZOLE SODIUM 40 MG PO TBEC: 40.0000 mg | DELAYED_RELEASE_TABLET | Freq: Every day | ORAL | 1 refills | Status: DC

## 2021-10-21 NOTE — Telephone Encounter (Signed)
MEDICATION: pantoprazole (PROTONIX) 40 MG tablet  PHARMACY:  [facility] [place], [address] AT [address] Phone:  [phone]  Fax:  [phone]      Comments:   **Let patient know to contact pharmacy at the end of the day to make sure medication is ready. **  ** Please notify patient to allow 48-72 hours to process**  **Encourage patient to contact the pharmacy for refills or they can request refills through [facility]**

## 2021-10-21 NOTE — Telephone Encounter (Signed)
Rx sent in

## 2021-10-24 ENCOUNTER — Other Ambulatory Visit: Payer: Self-pay | Admitting: Physician Assistant

## 2021-10-24 ENCOUNTER — Telehealth: Payer: Self-pay

## 2021-10-24 DIAGNOSIS — F419 Anxiety disorder, unspecified: Secondary | ICD-10-CM

## 2021-10-24 MED ORDER — ALPRAZOLAM 0.5 MG PO TABS: ORAL_TABLET | ORAL | 0 refills | Status: DC

## 2021-10-24 NOTE — Telephone Encounter (Signed)
..   Encourage patient to contact the pharmacy for refills or they can request refills through [place]:  10/01/21  NEXT APPOINTMENT DATE:  03/12/22  MEDICATION: xanax  Is the patient out of medication?   PHARMACY:  [facility] on [address]   Let patient know to contact pharmacy at the end of the day to make sure medication is ready.  Please notify patient to allow 48-72 hours to process  CLINICAL FILLS OUT ALL BELOW:   LAST REFILL:  QTY:  REFILL DATE:    OTHER COMMENTS:    Okay for refill?  Please advise

## 2021-10-29 DIAGNOSIS — E119 Type 2 diabetes mellitus without complications: Secondary | ICD-10-CM | POA: Diagnosis not present

## 2021-10-29 DIAGNOSIS — Z0100 Encounter for examination of eyes and vision without abnormal findings: Secondary | ICD-10-CM | POA: Diagnosis not present

## 2021-10-29 LAB — HM DIABETES EYE EXAM

## 2021-11-01 ENCOUNTER — Other Ambulatory Visit: Payer: Self-pay | Admitting: Physician Assistant

## 2021-11-25 ENCOUNTER — Telehealth: Payer: Self-pay | Admitting: Physician Assistant

## 2021-11-25 ENCOUNTER — Other Ambulatory Visit: Payer: Self-pay

## 2021-11-25 DIAGNOSIS — J4541 Moderate persistent asthma with (acute) exacerbation: Secondary | ICD-10-CM

## 2021-11-25 MED ORDER — IPRATROPIUM BROMIDE 0.06 % NA SOLN: NASAL | 1 refills | Status: DC

## 2021-11-25 MED ORDER — CANAGLIFLOZIN 300 MG PO TABS: ORAL_TABLET | ORAL | 2 refills | Status: DC

## 2021-11-25 NOTE — Telephone Encounter (Signed)
Patient needs an rx refill for: ? ?canagliflozin (INVOKANA) 300 MG TABS tablet ?ipratropium (ATROVENT) 0.06 % nasal spray ? ?Also requesting Nebulizer tubs to be sent in (requested a couple).-  ? ?[facility] - ?

## 2021-11-25 NOTE — Telephone Encounter (Signed)
Rx sent in

## 2021-12-09 ENCOUNTER — Telehealth: Payer: Self-pay | Admitting: Physician Assistant

## 2021-12-09 ENCOUNTER — Other Ambulatory Visit: Payer: Self-pay | Admitting: Physician Assistant

## 2021-12-09 DIAGNOSIS — F419 Anxiety disorder, unspecified: Secondary | ICD-10-CM

## 2021-12-09 MED ORDER — ALPRAZOLAM 0.5 MG PO TABS: ORAL_TABLET | ORAL | 2 refills | Status: DC

## 2021-12-09 NOTE — Telephone Encounter (Signed)
.. ?  Encourage patient to contact the pharmacy for refills or they can request refills through [facility] ? ?LAST APPOINTMENT DATE:  10/01/21 ? ? ?NEXT APPOINTMENT DATE: 03/12/22 ? ?MEDICATION:ALPRAZolam (XANAX) 0.5 MG tablet ? ?Is the patient out of medication? yes ? ?PHARMACY: ?[id number], [address] Phone:  [phone]  ?Fax:  [phone]  ?  ? ? ?Let patient know to contact pharmacy at the end of the day to make sure medication is ready. ? ?Please notify patient to allow 48-72 hours to process  ?

## 2021-12-12 ENCOUNTER — Encounter: Payer: Self-pay | Admitting: Physician Assistant

## 2021-12-12 ENCOUNTER — Ambulatory Visit (INDEPENDENT_AMBULATORY_CARE_PROVIDER_SITE_OTHER): Payer: Medicare HMO | Admitting: Physician Assistant

## 2021-12-12 VITALS — BP 108/69 | HR 74 | Temp 98.3°F | Ht 65.0 in | Wt 283.2 lb

## 2021-12-12 DIAGNOSIS — N644 Mastodynia: Secondary | ICD-10-CM

## 2021-12-12 NOTE — Progress Notes (Signed)
? ?Subjective:  ? ? Patient ID: [name] [name], female    DOB: 11/28/1950, 71 y.o.   MRN: [id number] ? ?Chief Complaint  ?Patient presents with  ? Breast Pain  ? ? ?HPI ?Patient is in today for right-sided intermittent breast pain for the last 3 months.  Patient describes the pain as an intermittent electrical shock that starts from her right armpit and goes across the right breast.  It only lasts for a second or two and then goes away on its own.  States that she has a history of intermittent pain in both breasts from caffeine use and tries to cut back on this.  No personal history of breast cancer and no family history of breast cancer.  Last mammogram was 01/20/2020 and this was normal. ? ?Past Medical History:  ?Diagnosis Date  ? Bronchitis, asthmatic   ? Coronary artery disease   ? BMS to LAD 02/2008  ? Diabetes mellitus without complication ([place])   ? Hyperlipidemia   ? Hypertensive heart disease 04/16/2016  ? Hypothyroidism   ? MI, old   ? Morbid obesity ([place]) 04/16/2016  ? S/P coronary artery stent placement 04/16/2016  ? BMS to LAD 2009  ? Tobacco abuse 04/16/2016  ? ? ?Past Surgical History:  ?Procedure Laterality Date  ? ABDOMINAL HYSTERECTOMY    ? ANKLE SURGERY Right   ? CHOLECYSTECTOMY    ? CORONARY ANGIOPLASTY  02/2008  ? bare metal stent to LAD   ? CORONARY STENT PLACEMENT    ? KNEE ARTHROSCOPY    ? ROTATOR CUFF REPAIR Bilateral   ? THORACIC OUTLET SURGERY    ? WRIST SURGERY    ? ? ?Family History  ?Problem Relation Age of Onset  ? Cancer Mother   ? Diabetes Son   ? Hypertension Son   ? ? ?Social History  ? ?Tobacco Use  ? Smoking status: Light Smoker  ?  Packs/day: 0.50  ?  Years: 31.00  ?  Pack years: 15.50  ?  Types: Cigarettes  ? Smokeless tobacco: Never  ?Vaping Use  ? Vaping Use: Never used  ?Substance Use Topics  ? Alcohol use: No  ? Drug use: No  ?  ? ?Allergies  ?Allergen Reactions  ? Clindamycin/Lincomycin Other (See Comments)  ?  Head ache   ? Codeine Hives  ? Cortisone   ? Dilaudid [Hydromorphone  Hcl] Nausea And Vomiting  ? Iodides   ? Reglan [Metoclopramide] Other (See Comments)  ?  Other reaction(s): Dystonia ?Hallucinations/Violent  ? Tramadol   ? Tape Rash  ?  Blisters   ? ? ?Review of Systems ?NEGATIVE UNLESS OTHERWISE INDICATED IN HPI ? ? ?   ?Objective:  ?  ? ?BP 108/69   Pulse 74   Temp 98.3 ?F (36.8 ?C)   Ht '5\' 5"'$  (1.651 m)   Wt 283 lb 3.2 oz (128.5 kg)   LMP  (LMP Unknown)   SpO2 95%   BMI 47.13 kg/m?  ? ?Wt Readings from Last 3 Encounters:  ?12/12/21 283 lb 3.2 oz (128.5 kg)  ?10/01/21 282 lb 3.2 oz (128 kg)  ?09/11/21 279 lb 3.2 oz (126.6 kg)  ? ? ?BP Readings from Last 3 Encounters:  ?12/12/21 108/69  ?10/01/21 (!) 143/78  ?09/11/21 111/69  ?  ? ?Physical Exam ?Constitutional:   ?   Appearance: Normal appearance. She is obese.  ?Chest:  ?   Chest wall: No mass, deformity, swelling or tenderness.  ?Breasts: ?   Right: Normal. No  swelling, bleeding, inverted nipple, mass, nipple discharge, skin change or tenderness.  ?   Left: Normal. No swelling, bleeding, inverted nipple, mass, nipple discharge, skin change or tenderness.  ?Lymphadenopathy:  ?   Upper Body:  ?   Right upper body: No supraclavicular or axillary adenopathy.  ?   Left upper body: No supraclavicular or axillary adenopathy.  ?Neurological:  ?   Mental Status: She is alert.  ? ? ?   ?Assessment & Plan:  ? ?Problem List Items Addressed This Visit   ?None ?Visit Diagnoses   ? ? Breast pain, right    -  Primary  ? Relevant Orders  ? MM Digital Diagnostic Bilat  ? US BREAST COMPLETE UNI RIGHT INC AXILLA  ? ?  ? ? ? ?1. Breast pain, right ?No abnormal findings on physical exam today.  However given her recent concerns, we will plan for diagnostic mammogram with ultrasound as she is due for mammogram at this time anyway.  Patient to call if any changes or other concerns.  Advised to reduce caffeine and drink more water. ? ? ?This note was prepared with assistance of Systems analyst. Occasional wrong-word or  sound-a-like substitutions may have occurred due to the inherent limitations of voice recognition software. ? ? ?Jaqlyn Gruenhagen M Sonali Wivell, PA-C ?

## 2021-12-12 NOTE — Patient Instructions (Signed)
Referral sent for diagnostic mammogram + ultrasound. You should be contacted to set this up. ?Try to wear supportive bras. Quit caffeine if able.  ?

## 2021-12-13 ENCOUNTER — Other Ambulatory Visit: Payer: Self-pay | Admitting: Physician Assistant

## 2021-12-13 DIAGNOSIS — N644 Mastodynia: Secondary | ICD-10-CM

## 2021-12-18 ENCOUNTER — Ambulatory Visit: Payer: Medicare HMO

## 2021-12-31 ENCOUNTER — Ambulatory Visit (INDEPENDENT_AMBULATORY_CARE_PROVIDER_SITE_OTHER): Payer: Medicare HMO | Admitting: Physician Assistant

## 2021-12-31 ENCOUNTER — Encounter: Payer: Self-pay | Admitting: Physician Assistant

## 2021-12-31 VITALS — BP 112/73 | HR 75 | Temp 98.2°F | Ht 65.0 in | Wt 284.8 lb

## 2021-12-31 DIAGNOSIS — M7989 Other specified soft tissue disorders: Secondary | ICD-10-CM | POA: Diagnosis not present

## 2021-12-31 DIAGNOSIS — M79643 Pain in unspecified hand: Secondary | ICD-10-CM | POA: Diagnosis not present

## 2021-12-31 DIAGNOSIS — E1165 Type 2 diabetes mellitus with hyperglycemia: Secondary | ICD-10-CM

## 2021-12-31 DIAGNOSIS — E1169 Type 2 diabetes mellitus with other specified complication: Secondary | ICD-10-CM

## 2021-12-31 LAB — COMPREHENSIVE METABOLIC PANEL
ALT: 15 U/L (ref 0–35)
AST: 16 U/L (ref 0–37)
Albumin: 4 g/dL (ref 3.5–5.2)
Alkaline Phosphatase: 81 U/L (ref 39–117)
BUN: 22 mg/dL (ref 6–23)
CO2: 29 mEq/L (ref 19–32)
Calcium: 10 mg/dL (ref 8.4–10.5)
Chloride: 104 mEq/L (ref 96–112)
Creatinine, Ser: 1.3 mg/dL — ABNORMAL HIGH (ref 0.40–1.20)
GFR: 42.24 mL/min — ABNORMAL LOW (ref >60.00)
Glucose, Bld: 137 mg/dL — ABNORMAL HIGH (ref 70–99)
Potassium: 4.8 mEq/L (ref 3.5–5.1)
Sodium: 139 mEq/L (ref 135–145)
Total Bilirubin: 0.6 mg/dL (ref 0.2–1.2)
Total Protein: 6.8 g/dL (ref 6.0–8.3)

## 2021-12-31 LAB — CBC WITH DIFFERENTIAL/PLATELET
Basophils Absolute: 0 10*3/uL (ref 0.0–0.1)
Basophils Relative: 0.7 % (ref 0.0–3.0)
Eosinophils Absolute: 0.1 10*3/uL (ref 0.0–0.7)
Eosinophils Relative: 1.9 % (ref 0.0–5.0)
HCT: 50.3 % — ABNORMAL HIGH (ref 36.0–46.0)
Hemoglobin: 16.2 g/dL — ABNORMAL HIGH (ref 12.0–15.0)
Lymphocytes Relative: 25.4 % (ref 12.0–46.0)
Lymphs Abs: 1.8 10*3/uL (ref 0.7–4.0)
MCHC: 32.3 g/dL (ref 30.0–36.0)
MCV: 95.5 fl (ref 78.0–100.0)
Monocytes Absolute: 0.5 10*3/uL (ref 0.1–1.0)
Monocytes Relative: 7.8 % (ref 3.0–12.0)
Neutro Abs: 4.5 10*3/uL (ref 1.4–7.7)
Neutrophils Relative %: 64.2 % (ref 43.0–77.0)
Platelets: 156 10*3/uL (ref 150.0–400.0)
RBC: 5.27 Mil/uL — ABNORMAL HIGH (ref 3.87–5.11)
RDW: 15 % (ref 11.5–15.5)
WBC: 7 10*3/uL (ref 4.0–10.5)

## 2021-12-31 LAB — SEDIMENTATION RATE: Sed Rate: 31 mm/hr — ABNORMAL HIGH (ref 0–30)

## 2021-12-31 LAB — HEMOGLOBIN A1C: Hgb A1c MFr Bld: 7.4 % — ABNORMAL HIGH (ref 4.6–6.5)

## 2021-12-31 MED ORDER — CANAGLIFLOZIN 300 MG PO TABS: 300.0000 mg | ORAL_TABLET | Freq: Every day | ORAL | 1 refills | Status: AC

## 2021-12-31 NOTE — Progress Notes (Signed)
? ?Subjective:  ? ? Patient ID: [name] [name], female    DOB: [birth year], 71 y.o.   MRN: [id number] ? ?Chief Complaint  ?Patient presents with  ? Diabetes  ?  Pt states that she was prescribed a half of Ivokana, and her readings have been 180-200. Her AM reading was 120, and after breakfast was 160, with 2 eggs and wheat waffles. She is requesting refills for a whole tab on Ivokana because she is almost out.   ? ? ?HPI ?Patient is in today for diabetes recheck.  ?Fasting sugar this morning was 140. ?Average morning sugars have been 140-180 the last two weeks. ?Normally she was running in the 80s in the mornings previously. ?Patient states that she feels like she needs to take a full tablet of Invokana again. ? ?Has been having some sherbet in the evenings. Not eating sweets and chips like she was previously she says.  ? ?No exercise.  She enjoys swimming pool in the summer. ? ?Feels fine, but isn't sleeping well. No recent illness.  ?No new vision changes. No abdominal pain. No N/V. No diarrhea.  ?No complaints of neuropathy.  ? ?Patient also mentions today that she would like to be checked for rheumatoid arthritis as she has been having some pain and swelling especially in her right hand.  Occasional stiffness in the mornings.  No other major joint pain. ? ? ?Past Medical History:  ?Diagnosis Date  ? Bronchitis, asthmatic   ? Coronary artery disease   ? BMS to LAD 02/2008  ? Diabetes mellitus without complication ([place])   ? Hyperlipidemia   ? Hypertensive heart disease 04/16/2016  ? Hypothyroidism   ? MI, old   ? Morbid obesity ([place]) 04/16/2016  ? S/P coronary artery stent placement 04/16/2016  ? BMS to LAD 2009  ? Tobacco abuse 04/16/2016  ? ? ?Past Surgical History:  ?Procedure Laterality Date  ? ABDOMINAL HYSTERECTOMY    ? ANKLE SURGERY Right   ? CHOLECYSTECTOMY    ? CORONARY ANGIOPLASTY  02/2008  ? bare metal stent to LAD   ? CORONARY STENT PLACEMENT    ? KNEE ARTHROSCOPY    ? ROTATOR CUFF REPAIR Bilateral   ? THORACIC  OUTLET SURGERY    ? WRIST SURGERY    ? ? ?Family History  ?Problem Relation Age of Onset  ? Cancer Mother   ? Diabetes Son   ? Hypertension Son   ? ? ?Social History  ? ?Tobacco Use  ? Smoking status: Light Smoker  ?  Packs/day: 0.50  ?  Years: 31.00  ?  Pack years: 15.50  ?  Types: Cigarettes  ? Smokeless tobacco: Never  ?Vaping Use  ? Vaping Use: Never used  ?Substance Use Topics  ? Alcohol use: No  ? Drug use: No  ?  ? ?Allergies  ?Allergen Reactions  ? Clindamycin/Lincomycin Other (See Comments)  ?  Head ache   ? Codeine Hives  ? Cortisone   ? Dilaudid [Hydromorphone Hcl] Nausea And Vomiting  ? Iodides   ? Reglan [Metoclopramide] Other (See Comments)  ?  Other reaction(s): Dystonia ?Hallucinations/Violent  ? Tramadol   ? Tape Rash  ?  Blisters   ? ? ?Review of Systems ?NEGATIVE UNLESS OTHERWISE INDICATED IN HPI ? ? ?   ?Objective:  ?  ? ?BP 112/73   Pulse 75   Temp 98.2 ?F (36.8 ?C) (Temporal)   Ht '5\' 5"'$  (1.651 m)   Wt 284 lb 12.8 oz (129.2 kg)  LMP  (LMP Unknown)   SpO2 96%   BMI 47.39 kg/m?  ? ?Wt Readings from Last 3 Encounters:  ?12/31/21 284 lb 12.8 oz (129.2 kg)  ?12/12/21 283 lb 3.2 oz (128.5 kg)  ?10/01/21 282 lb 3.2 oz (128 kg)  ? ? ?BP Readings from Last 3 Encounters:  ?12/31/21 112/73  ?12/12/21 108/69  ?10/01/21 (!) 143/78  ?  ? ?Physical Exam ?Vitals and nursing note reviewed.  ?Constitutional:   ?   Appearance: Normal appearance. She is obese. She is not toxic-appearing.  ?HENT:  ?   Head: Normocephalic and atraumatic.  ?   Right Ear: External ear normal.  ?   Left Ear: External ear normal.  ?   Nose: Nose normal.  ?   Mouth/Throat:  ?   Mouth: Mucous membranes are moist.  ?Eyes:  ?   Extraocular Movements: Extraocular movements intact.  ?   Conjunctiva/sclera: Conjunctivae normal.  ?   Pupils: Pupils are equal, round, and reactive to light.  ?Cardiovascular:  ?   Rate and Rhythm: Normal rate and regular rhythm.  ?   Pulses: Normal pulses.  ?   Heart sounds: Normal heart sounds.   ?Pulmonary:  ?   Effort: Pulmonary effort is normal.  ?   Breath sounds: Normal breath sounds.  ?Musculoskeletal:  ?   Cervical back: Normal range of motion and neck supple.  ?   Comments: Some slight ulnar deviation noted in her right hand.  Neurovascular exam intact.  ?Skin: ?   General: Skin is warm and dry.  ?Neurological:  ?   Mental Status: She is alert and oriented to person, place, and time.  ?Psychiatric:     ?   Mood and Affect: Mood normal.     ?   Behavior: Behavior normal.  ? ? ?   ?Assessment & Plan:  ? ?Problem List Items Addressed This Visit   ?None ?Visit Diagnoses   ? ? Type 2 diabetes mellitus with other specified complication, without long-term current use of insulin ([place])    -  Primary  ? Relevant Medications  ? canagliflozin (INVOKANA) 300 MG TABS tablet  ? Other Relevant Orders  ? CBC with Differential/Platelet (Completed)  ? Comprehensive metabolic panel (Completed)  ? Hemoglobin A1c (Completed)  ? Type 2 diabetes mellitus with hyperglycemia, without long-term current use of insulin ([place])      ? Relevant Medications  ? canagliflozin (INVOKANA) 300 MG TABS tablet  ? Other Relevant Orders  ? CBC with Differential/Platelet (Completed)  ? Comprehensive metabolic panel (Completed)  ? Hemoglobin A1c (Completed)  ? Intermittent pain and swelling of hand      ? Relevant Orders  ? CBC with Differential/Platelet (Completed)  ? Comprehensive metabolic panel (Completed)  ? Sedimentation rate (Completed)  ? Rheumatoid Factor (Completed)  ? ?  ? ? ? ?Meds ordered this encounter  ?Medications  ? canagliflozin (INVOKANA) 300 MG TABS tablet  ?  Sig: Take 1 tablet (300 mg total) by mouth daily before breakfast.  ?  Dispense:  90 tablet  ?  Refill:  1  ? ?1. Type 2 diabetes mellitus with other specified complication, without long-term current use of insulin ([place]) ?2. Type 2 diabetes mellitus with hyperglycemia, without long-term current use of insulin ([place]) ?As patient's sugars have increased recently, I agree  with increasing Invokana 300 mg back to full tablet daily.  She will continue on Tradjenta 5 mg.  As always, discussed lifestyle changes to help with sugar control.  Recheck A1c and CMP today. ? ?3. Intermittent pain and swelling of hand ?Symptoms are not completely consistent with rheumatoid arthritis, but as patient is concerned and some arthritis changes are noted, plan to check labs and treat pending results. ? ?Follow-up as scheduled. ? ? ?This note was prepared with assistance of Systems analyst. Occasional wrong-word or sound-a-like substitutions may have occurred due to the inherent limitations of voice recognition software. ? ? ?Ayriana Wix M Brooks Kinnan, PA-C ?

## 2021-12-31 NOTE — Patient Instructions (Addendum)
Good to see you today! ?Please have labs checked and I will call with results. ?New Rx for full tab of Invokana sent for you.  ? ?Follow up as scheduled.  ?

## 2022-01-01 ENCOUNTER — Ambulatory Visit
Admission: RE | Admit: 2022-01-01 | Discharge: 2022-01-01 | Disposition: A | Discharge status: Home / Self Care | Payer: Medicare HMO | Source: Ambulatory Visit | Attending: Physician Assistant | Admitting: Physician Assistant

## 2022-01-01 DIAGNOSIS — N644 Mastodynia: Secondary | ICD-10-CM

## 2022-01-01 DIAGNOSIS — Z1231 Encounter for screening mammogram for malignant neoplasm of breast: Secondary | ICD-10-CM | POA: Diagnosis not present

## 2022-01-01 LAB — RHEUMATOID FACTOR: Rheumatoid fact SerPl-aCnc: <14 IU/mL (ref <14)

## 2022-01-10 ENCOUNTER — Telehealth: Payer: Self-pay | Admitting: Physician Assistant

## 2022-01-10 ENCOUNTER — Other Ambulatory Visit: Payer: Self-pay | Admitting: Family Medicine

## 2022-01-10 DIAGNOSIS — J4541 Moderate persistent asthma with (acute) exacerbation: Secondary | ICD-10-CM

## 2022-01-10 NOTE — Telephone Encounter (Signed)
Pt states she is needing a refill on her rx but it states she should still have 3 refills. She is checking with the pharmacy.  ? ?[id number] (VENTOLIN HFA) 108 (90 Base) MCG/ACT inhaler ?

## 2022-01-10 NOTE — Telephone Encounter (Signed)
.. ?  Encourage patient to contact the pharmacy for refills or they can request refills through [facility] ? ?LAST APPOINTMENT DATE:  12/31/21  ? ?NEXT APPOINTMENT DATE: 03/12/22 ? ?MEDICATION:albuterol (VENTOLIN HFA) 108 (90 Base) MCG/ACT inhaler ? ?Is the patient out of medication?  ? ?PHARMACY: ?[id number], [place] Phone:  [phone]  ?Fax:  [phone]  ?  ? ? ?Let patient know to contact pharmacy at the end of the day to make sure medication is ready. ? ?Please notify patient to allow 48-72 hours to process  ?

## 2022-01-13 ENCOUNTER — Other Ambulatory Visit: Payer: Self-pay

## 2022-01-13 DIAGNOSIS — J4541 Moderate persistent asthma with (acute) exacerbation: Secondary | ICD-10-CM

## 2022-01-13 MED ORDER — ALBUTEROL SULFATE HFA 108 (90 BASE) MCG/ACT IN AERS: 2.0000 | INHALATION_SPRAY | Freq: Four times a day (QID) | RESPIRATORY_TRACT | 3 refills | Status: DC | PRN

## 2022-01-13 NOTE — Telephone Encounter (Signed)
Refill sent to pharmacy.   

## 2022-01-14 DIAGNOSIS — M19041 Primary osteoarthritis, right hand: Secondary | ICD-10-CM | POA: Diagnosis not present

## 2022-01-27 ENCOUNTER — Ambulatory Visit (INDEPENDENT_AMBULATORY_CARE_PROVIDER_SITE_OTHER): Payer: Medicare HMO

## 2022-01-27 ENCOUNTER — Telehealth: Payer: Self-pay

## 2022-01-27 DIAGNOSIS — Z0000 Encounter for general adult medical examination without abnormal findings: Secondary | ICD-10-CM | POA: Diagnosis not present

## 2022-01-27 NOTE — Patient Instructions (Signed)
[name] [name] , ?Thank you for taking time to come for your Medicare Wellness Visit. I appreciate your ongoing commitment to your health goals. Please review the following plan we discussed and let me know if I can assist you in the future.  ? ?Screening recommendations/referrals: ?Colonoscopy: done 11/07/16 repeat every 10 years  ?Mammogram: done 01/01/22 repeat every year  ?Bone Density: done 01/20/20 repeat every 5 years  ?Recommended yearly ophthalmology/optometry visit for glaucoma screening and checkup ?Recommended yearly dental visit for hygiene and checkup ? ?Vaccinations: ?Influenza vaccine: done 07/16/21 repeat every year  ?Pneumococcal vaccine: due and discussed ?Tdap vaccine: done 06/22/14 repeat every 10 years  ?Shingles vaccine: Shingrix discussed. Please contact your pharmacy for coverage information.    ?Covid-19:completed 5/13, 03/05/20 ? ?Advanced directives: Advance directive discussed with you today. Even though you declined this today please call our office should you change your mind and we can give you the proper paperwork for you to fill out. ? ?Conditions/risks identified: Lose weight  ? ?Next appointment: Follow up in one year for your annual wellness visit  ? ? ?Preventive Care 91 Years and Older, Female ?Preventive care refers to lifestyle choices and visits with your health care provider that can promote health and wellness. ?What does preventive care include? ?A yearly physical exam. This is also called an annual well check. ?Dental exams once or twice a year. ?Routine eye exams. Ask your health care provider how often you should have your eyes checked. ?Personal lifestyle choices, including: ?Daily care of your teeth and gums. ?Regular physical activity. ?Eating a healthy diet. ?Avoiding tobacco and drug use. ?Limiting alcohol use. ?Practicing safe sex. ?Taking low-dose aspirin every day. ?Taking vitamin and mineral supplements as recommended by your health care provider. ?What happens during an  annual well check? ?The services and screenings done by your health care provider during your annual well check will depend on your age, overall health, lifestyle risk factors, and family history of disease. ?Counseling  ?Your health care provider may ask you questions about your: ?Alcohol use. ?Tobacco use. ?Drug use. ?Emotional well-being. ?Home and relationship well-being. ?Sexual activity. ?Eating habits. ?History of falls. ?Memory and ability to understand (cognition). ?Work and work Statistician. ?Reproductive health. ?Screening  ?You may have the following tests or measurements: ?Height, weight, and BMI. ?Blood pressure. ?Lipid and cholesterol levels. These may be checked every 5 years, or more frequently if you are over 52 years old. ?Skin check. ?Lung cancer screening. You may have this screening every year starting at age 30 if you have a 30-pack-year history of smoking and currently smoke or have quit within the past 15 years. ?Fecal occult blood test (FOBT) of the stool. You may have this test every year starting at age 20. ?Flexible sigmoidoscopy or colonoscopy. You may have a sigmoidoscopy every 5 years or a colonoscopy every 10 years starting at age 27. ?Hepatitis C blood test. ?Hepatitis B blood test. ?Sexually transmitted disease (STD) testing. ?Diabetes screening. This is done by checking your blood sugar (glucose) after you have not eaten for a while (fasting). You may have this done every 1-3 years. ?Bone density scan. This is done to screen for osteoporosis. You may have this done starting at age 38. ?Mammogram. This may be done every 1-2 years. Talk to your health care provider about how often you should have regular mammograms. ?Talk with your health care provider about your test results, treatment options, and if necessary, the need for more tests. ?Vaccines  ?Your health  care provider may recommend certain vaccines, such as: ?Influenza vaccine. This is recommended every year. ?Tetanus,  diphtheria, and acellular pertussis (Tdap, Td) vaccine. You may need a Td booster every 10 years. ?Zoster vaccine. You may need this after age 27. ?Pneumococcal 13-valent conjugate (PCV13) vaccine. One dose is recommended after age 60. ?Pneumococcal polysaccharide (PPSV23) vaccine. One dose is recommended after age 49. ?Talk to your health care provider about which screenings and vaccines you need and how often you need them. ?This information is not intended to replace advice given to you by your health care provider. Make sure you discuss any questions you have with your health care provider. ?Document Released: 10/05/2015 Document Revised: 05/28/2016 Document Reviewed: 07/10/2015 ?Elsevier Interactive Patient Education ? 2017 [place]. ? ?Fall Prevention in the Home ?Falls can cause injuries. They can happen to people of all ages. There are many things you can do to make your home safe and to help prevent falls. ?What can I do on the outside of my home? ?Regularly fix the edges of walkways and driveways and fix any cracks. ?Remove anything that might make you trip as you walk through a door, such as a raised step or threshold. ?Trim any bushes or trees on the path to your home. ?Use bright outdoor lighting. ?Clear any walking paths of anything that might make someone trip, such as rocks or tools. ?Regularly check to see if handrails are loose or broken. Make sure that both sides of any steps have handrails. ?Any raised decks and porches should have guardrails on the edges. ?Have any leaves, snow, or ice cleared regularly. ?Use sand or salt on walking paths during winter. ?Clean up any spills in your garage right away. This includes oil or grease spills. ?What can I do in the bathroom? ?Use night lights. ?Install grab bars by the toilet and in the tub and shower. Do not use towel bars as grab bars. ?Use non-skid mats or decals in the tub or shower. ?If you need to sit down in the shower, use a plastic,  non-slip stool. ?Keep the floor dry. Clean up any water that spills on the floor as soon as it happens. ?Remove soap buildup in the tub or shower regularly. ?Attach bath mats securely with double-sided non-slip rug tape. ?Do not have throw rugs and other things on the floor that can make you trip. ?What can I do in the bedroom? ?Use night lights. ?Make sure that you have a light by your bed that is easy to reach. ?Do not use any sheets or blankets that are too big for your bed. They should not hang down onto the floor. ?Have a firm chair that has side arms. You can use this for support while you get dressed. ?Do not have throw rugs and other things on the floor that can make you trip. ?What can I do in the kitchen? ?Clean up any spills right away. ?Avoid walking on wet floors. ?Keep items that you use a lot in easy-to-reach places. ?If you need to reach something above you, use a strong step stool that has a grab bar. ?Keep electrical cords out of the way. ?Do not use floor polish or wax that makes floors slippery. If you must use wax, use non-skid floor wax. ?Do not have throw rugs and other things on the floor that can make you trip. ?What can I do with my stairs? ?Do not leave any items on the stairs. ?Make sure that there are handrails on  both sides of the stairs and use them. Fix handrails that are broken or loose. Make sure that handrails are as long as the stairways. ?Check any carpeting to make sure that it is firmly attached to the stairs. Fix any carpet that is loose or worn. ?Avoid having throw rugs at the top or bottom of the stairs. If you do have throw rugs, attach them to the floor with carpet tape. ?Make sure that you have a light switch at the top of the stairs and the bottom of the stairs. If you do not have them, ask someone to add them for you. ?What else can I do to help prevent falls? ?Wear shoes that: ?Do not have high heels. ?Have rubber bottoms. ?Are comfortable and fit you well. ?Are closed  at the toe. Do not wear sandals. ?If you use a stepladder: ?Make sure that it is fully opened. Do not climb a closed stepladder. ?Make sure that both sides of the stepladder are locked into place. ?Ask someone t

## 2022-01-27 NOTE — Progress Notes (Signed)
Virtual Visit via Telephone Note ? ?I connected with  [name] [name] on 01/27/22 at 10:15 AM EDT by telephone and verified that I am speaking with the correct person using two identifiers. ? ?Medicare Annual Wellness visit completed telephonically due to Covid-19 pandemic.  ? ?Persons participating in this call: This Health Coach and this patient.  ? ?Location: ?Patient: home ?Provider: office ?  ?I discussed the limitations, risks, security and privacy concerns of performing an evaluation and management service by telephone and the availability of in person appointments. The patient expressed understanding and agreed to proceed. ? ?Unable to perform video visit due to video visit attempted and failed and/or patient does not have video capability.  ? ?Some vital signs may be absent or patient reported.  ? ?Willette Brace, LPN ? ? ?Subjective:  ? [name] [name] is a 71 y.o. female who presents for Medicare Annual (Subsequent) preventive examination. ? ?Review of Systems    ? ?Cardiac Risk Factors include: advanced age (>12men, >68 women);diabetes mellitus;hypertension;dyslipidemia;obesity (BMI >30kg/m2);smoking/ tobacco exposure ? ?   ?Objective:  ?  ?There were no vitals filed for this visit. ?There is no height or weight on file to calculate BMI. ? ? ?  01/27/2022  ? 10:28 AM 01/21/2021  ? 10:41 AM 11/21/2019  ?  3:56 PM 02/15/2019  ?  9:15 PM 02/15/2019  ?  4:37 PM 11/25/2017  ?  1:16 PM 02/29/2016  ?  8:21 PM  ?Advanced Directives  ?Does Patient Have a Medical Advance Directive? No No No No No No No  ?Does patient want to make changes to medical advance directive?  No - Patient declined       ?Would patient like information on creating a medical advance directive? No - Patient declined  Yes (MAU/Ambulatory/Procedural Areas - Information given) No - Patient declined  No - Patient declined   ? ? ?Current Medications (verified) ?Outpatient Encounter Medications as of 01/27/2022  ?Medication Sig  ? Accu-Chek FastClix Lancets MISC USE  TO TEST UP TO FOUR TIMES DAILY AS DIRECTED  ? ACCU-CHEK GUIDE test strip USE AS DIRECTED TO TEST BLOOD SUGAR DAILY  ? albuterol (ACCUNEB) 1.25 MG/3ML nebulizer solution TAKE 3 MLS BY NEBULIZATION THREE TIMES DAILY AS NEEDED FOR WHEEZING  ? albuterol (VENTOLIN HFA) 108 (90 Base) MCG/ACT inhaler Inhale 2 puffs into the lungs every 6 (six) hours as needed for wheezing or shortness of breath.  ? ALPRAZolam (XANAX) 0.5 MG tablet TAKE 1 TABLET(0.5 MG) BY MOUTH TWICE DAILY AS NEEDED FOR ANXIETY  ? aspirin 81 MG tablet Take 81 mg by mouth daily.  ? atorvastatin (LIPITOR) 10 MG tablet TAKE 1 TABLET(10 MG) BY MOUTH DAILY  ? blood glucose meter kit and supplies KIT Dispense based on patient and insurance preference. Use up to four times daily as directed. DX E11.8  ? Calcium Carb-Cholecalciferol (CALCIUM 500 + D3 PO) Take by mouth.  ? canagliflozin (INVOKANA) 300 MG TABS tablet Take 1 tablet (300 mg total) by mouth daily before breakfast.  ? furosemide (LASIX) 20 MG tablet Taking 3 times a week  ? ipratropium (ATROVENT) 0.06 % nasal spray USE 2 SPRAYS IN EACH NOSTRIL FOUR TIMES DAILY AS DIRECTED  ? ipratropium-albuterol (DUONEB) 0.5-2.5 (3) MG/3ML SOLN Take 3 mLs by nebulization every 6 (six) hours as needed.  ? isosorbide mononitrate (IMDUR) 60 MG 24 hr tablet Take 1 tablet (60 mg total) by mouth daily.  ? levothyroxine (SYNTHROID) 100 MCG tablet Take 1 tablet (100 mcg total)  by mouth daily.  ? meloxicam (MOBIC) 7.5 MG tablet Take by mouth.  ? Menthol, Topical Analgesic, (BIOFREEZE) 4 % GEL Apply topically.  ? pantoprazole (PROTONIX) 40 MG tablet Take 1 tablet (40 mg total) by mouth daily.  ? TRADJENTA 5 MG TABS tablet TAKE 1 TABLET(5 MG) BY MOUTH DAILY  ? vitamin B-12 (CYANOCOBALAMIN) 1000 MCG tablet Take 1,000 mcg by mouth daily.  ? budesonide-formoterol (SYMBICORT) 80-4.5 MCG/ACT inhaler Inhale 2 puffs into the lungs 2 (two) times daily.  ? promethazine-dextromethorphan (PROMETHAZINE-DM) 6.25-15 MG/5ML syrup Take 5 mLs by  mouth at bedtime as needed for cough. (Patient not taking: Reported on 01/27/2022)  ? [DISCONTINUED] ALPRAZolam (XANAX) 0.5 MG tablet TAKE 1 TABLET(0.5 MG) BY MOUTH TWICE DAILY AS NEEDED FOR ANXIETY  ? ?No facility-administered encounter medications on file as of 01/27/2022.  ? ? ?Allergies (verified) ?Clindamycin/lincomycin, Codeine, Cortisone, Dilaudid [hydromorphone hcl], Iodides, Reglan [metoclopramide], Tramadol, and Tape  ? ?History: ?Past Medical History:  ?Diagnosis Date  ? Bronchitis, asthmatic   ? Coronary artery disease   ? BMS to LAD 02/2008  ? Diabetes mellitus without complication ([place])   ? Hyperlipidemia   ? Hypertensive heart disease 04/16/2016  ? Hypothyroidism   ? MI, old   ? Morbid obesity ([place]) 04/16/2016  ? S/P coronary artery stent placement 04/16/2016  ? BMS to LAD 2009  ? Tobacco abuse 04/16/2016  ? ?Past Surgical History:  ?Procedure Laterality Date  ? ABDOMINAL HYSTERECTOMY    ? ANKLE SURGERY Right   ? CHOLECYSTECTOMY    ? CORONARY ANGIOPLASTY  02/2008  ? bare metal stent to LAD   ? CORONARY STENT PLACEMENT    ? KNEE ARTHROSCOPY    ? ROTATOR CUFF REPAIR Bilateral   ? THORACIC OUTLET SURGERY    ? WRIST SURGERY    ? ?Family History  ?Problem Relation Age of Onset  ? Cancer Mother   ? Diabetes Son   ? Hypertension Son   ? ?Social History  ? ?Socioeconomic History  ? Marital status: Married  ?  Spouse name: Not on file  ? Number of children: Not on file  ? Years of education: Not on file  ? Highest education level: Not on file  ?Occupational History  ? Occupation: Disabled   ?Tobacco Use  ? Smoking status: Light Smoker  ?  Packs/day: 0.50  ?  Years: 31.00  ?  Pack years: 15.50  ?  Types: Cigarettes  ? Smokeless tobacco: Never  ?Vaping Use  ? Vaping Use: Never used  ?Substance and Sexual Activity  ? Alcohol use: No  ? Drug use: No  ? Sexual activity: Yes  ?  Partners: Male  ?Other Topics Concern  ? Not on file  ?Social History Narrative  ? Originally from [place]    ? Still drives   ? ?Social  Determinants of Health  ? ?Financial Resource Strain: Low Risk   ? Difficulty of Paying Living Expenses: Not hard at all  ?Food Insecurity: No Food Insecurity  ? Worried About Charity fundraiser in the Last Year: Never true  ? Ran Out of Food in the Last Year: Never true  ?Transportation Needs: No Transportation Needs  ? Lack of Transportation (Medical): No  ? Lack of Transportation (Non-Medical): No  ?Physical Activity: Inactive  ? Days of Exercise per Week: 0 days  ? Minutes of Exercise per Session: 0 min  ?Stress: No Stress Concern Present  ? Feeling of Stress : Not at all  ?Social Connections: Moderately  Isolated  ? Frequency of Communication with Friends and Family: More than three times a week  ? Frequency of Social Gatherings with Friends and Family: Twice a week  ? Attends Religious Services: Never  ? Active Member of Clubs or Organizations: No  ? Attends Archivist Meetings: Never  ? Marital Status: Married  ? ? ?Tobacco Counseling ?Ready to quit: Not Answered ?Counseling given: Not Answered ? ? ?Clinical Intake: ? ?Pre-visit preparation completed: Yes ? ?Pain : No/denies pain ? ?  ? ?BMI - recorded: 47.39 ?Nutritional Status: BMI > 30  Obese ?Nutritional Risks: None ?Diabetes: Yes ?CBG done?: Yes (114 per pt) ?CBG resulted in Enter/ Edit results?: No ?Did pt. bring in CBG monitor from home?: No ? ?How often do you need to have someone help you when you read instructions, pamphlets, or other written materials from your doctor or pharmacy?: 1 - Never ? ?Diabetic?Nutrition Risk Assessment: ? ?Has the patient had any N/V/D within the last 2 months?  No  ?Does the patient have any non-healing wounds?  No  ?Has the patient had any unintentional weight loss or weight gain?  No  ? ?Diabetes: ? ?Is the patient diabetic?  Yes  ?If diabetic, was a CBG obtained today?  Yes  ?Did the patient bring in their glucometer from home?  No  ?How often do you monitor your CBG's? Daily.  ? ?Financial Strains and  Diabetes Management: ? ?Are you having any financial strains with the device, your supplies or your medication? No .  ?Does the patient want to be seen by Chronic Care Management for management of their diabetes?  N

## 2022-01-27 NOTE — Telephone Encounter (Signed)
I have a pt of your MRN:  [id number] who is taking d3 and wants to know if she still need s vit d as well . Pt is also requesting a refill of mobic 7.'5mg'$  [facility] please advise  ?

## 2022-01-29 ENCOUNTER — Other Ambulatory Visit: Payer: Self-pay | Admitting: Physician Assistant

## 2022-01-29 ENCOUNTER — Other Ambulatory Visit: Payer: Self-pay

## 2022-01-29 MED ORDER — MELOXICAM 7.5 MG PO TABS: 7.5000 mg | ORAL_TABLET | Freq: Every day | ORAL | 0 refills | Status: DC

## 2022-01-29 NOTE — Telephone Encounter (Signed)
Patient called again today re medications - patient out of medications as of today. Asking for a refill to be sent to [facility] on file.  ?

## 2022-01-29 NOTE — Telephone Encounter (Signed)
Sent to pharmacy 

## 2022-01-30 NOTE — Telephone Encounter (Signed)
Spoke to pt to give message below. She states that Ortho wants her to take Vit D3 with Calcium. I advised her to continue that regimen if requested by her Provider, and Alyssa can check levels at her next visit. She voiced understanding.  ?

## 2022-02-13 ENCOUNTER — Other Ambulatory Visit: Payer: Self-pay

## 2022-02-13 ENCOUNTER — Telehealth: Payer: Self-pay | Admitting: Physician Assistant

## 2022-02-13 MED ORDER — BUDESONIDE-FORMOTEROL FUMARATE 80-4.5 MCG/ACT IN AERO: 2.0000 | INHALATION_SPRAY | Freq: Two times a day (BID) | RESPIRATORY_TRACT | 0 refills | Status: DC

## 2022-02-13 NOTE — Telephone Encounter (Signed)
Rx sent to pharmacy   

## 2022-02-13 NOTE — Telephone Encounter (Signed)
..   Encourage patient to contact the pharmacy for refills or they can request refills through [facility]:  Please schedule appointment if longer than 1 year  NEXT APPOINTMENT DATE: 03/12/22   MEDICATION: Symbicort inhaler   Is the patient out of medication? Yes   PHARMACY:[facility] [place], [address]  Let patient know to contact pharmacy at the end of the day to make sure medication is ready.  Please notify patient to allow 48-72 hours to process

## 2022-02-18 ENCOUNTER — Telehealth: Payer: Self-pay

## 2022-02-18 ENCOUNTER — Other Ambulatory Visit: Payer: Self-pay

## 2022-02-18 MED ORDER — MELOXICAM 7.5 MG PO TABS: 7.5000 mg | ORAL_TABLET | Freq: Every day | ORAL | 0 refills | Status: DC

## 2022-02-18 MED ORDER — LINAGLIPTIN 5 MG PO TABS: ORAL_TABLET | ORAL | 1 refills | Status: DC

## 2022-02-18 NOTE — Telephone Encounter (Signed)
Rx sent to pharmacy requested.

## 2022-02-18 NOTE — Telephone Encounter (Signed)
..   Encourage patient to contact the pharmacy for refills or they can request refills through [place]:   12/31/2021  NEXT APPOINTMENT DATE: 03/12/22  MEDICATION: tradjenta and meloxicam  Is the patient out of medication?   PHARMACY:  [facility] on [address]  Let patient know to contact pharmacy at the end of the day to make sure medication is ready.  Please notify patient to allow 48-72 hours to process  CLINICAL FILLS OUT ALL BELOW:   LAST REFILL:  QTY:  REFILL DATE:    OTHER COMMENTS:    Okay for refill?  Please advise

## 2022-03-12 ENCOUNTER — Ambulatory Visit (INDEPENDENT_AMBULATORY_CARE_PROVIDER_SITE_OTHER): Payer: Medicare HMO | Admitting: Physician Assistant

## 2022-03-12 ENCOUNTER — Encounter: Payer: Self-pay | Admitting: Physician Assistant

## 2022-03-12 VITALS — BP 101/62 | HR 85 | Temp 97.6°F | Ht 65.0 in | Wt 282.6 lb

## 2022-03-12 DIAGNOSIS — E039 Hypothyroidism, unspecified: Secondary | ICD-10-CM | POA: Diagnosis not present

## 2022-03-12 DIAGNOSIS — M79675 Pain in left toe(s): Secondary | ICD-10-CM

## 2022-03-12 DIAGNOSIS — R232 Flushing: Secondary | ICD-10-CM

## 2022-03-12 DIAGNOSIS — R918 Other nonspecific abnormal finding of lung field: Secondary | ICD-10-CM

## 2022-03-12 DIAGNOSIS — F17210 Nicotine dependence, cigarettes, uncomplicated: Secondary | ICD-10-CM | POA: Diagnosis not present

## 2022-03-12 DIAGNOSIS — E1165 Type 2 diabetes mellitus with hyperglycemia: Secondary | ICD-10-CM | POA: Diagnosis not present

## 2022-03-12 DIAGNOSIS — F419 Anxiety disorder, unspecified: Secondary | ICD-10-CM

## 2022-03-12 NOTE — Progress Notes (Signed)
Subjective:    Patient ID: [name] [name], female    DOB: 10-20-1950, 71 y.o.   MRN: [id number]  Chief Complaint  Patient presents with   Follow-up    Pt coming in for 6 month f/u with fasting labs; pt was not awre to fast but can come back for labs; pt states things are ok, great toe on left foot is causing some pain and nail discoloration; discuss pneumonia vaccine;     HPI Patient with hx T2DM, morbid obesity, hypothyroidism, COPD, is in today for regular follow-up.  Taking usual medications. Does note that sleep patterns have been irregular. Usually takes Xanax 0.5 mg before bed, but still having some nights trouble falling asleep and staying asleep. Hot flashes starting to flare up again in the last 2 months. No uterus since age 24 due to severe menorrhagia. Still has R ovary, not sure why they took the left.   Stress at home. Husband has been diagnosed with dementia. Trying to get in the pool for exercise and stress reduction.   Having some pain with L great toe and toenail fungus. NKI. No open sores or wounds.   Past Medical History:  Diagnosis Date   Bronchitis, asthmatic    Coronary artery disease    BMS to LAD 02/2008   Diabetes mellitus without complication ([place])    Hyperlipidemia    Hypertensive heart disease 04/16/2016   Hypothyroidism    MI, old    Morbid obesity ([place]) 04/16/2016   S/P coronary artery stent placement 04/16/2016   BMS to LAD 2009   Tobacco abuse 04/16/2016    Past Surgical History:  Procedure Laterality Date   ABDOMINAL HYSTERECTOMY     35; very heavy bleeding   ANKLE SURGERY Right    CHOLECYSTECTOMY     CORONARY ANGIOPLASTY  02/2008   bare metal stent to LAD    CORONARY STENT PLACEMENT     KNEE ARTHROSCOPY     OOPHORECTOMY Left    age 24   ROTATOR CUFF REPAIR Bilateral    THORACIC OUTLET SURGERY     WRIST SURGERY      Family History  Problem Relation Age of Onset   Cancer Mother    Diabetes Son    Hypertension Son     Social History    Tobacco Use   Smoking status: Light Smoker    Packs/day: 0.50    Years: 31.00    Total pack years: 15.50    Types: Cigarettes   Smokeless tobacco: Never  Vaping Use   Vaping Use: Never used  Substance Use Topics   Alcohol use: No   Drug use: No     Allergies  Allergen Reactions   Clindamycin/Lincomycin Other (See Comments)    Head ache    Codeine Hives   Cortisone    Dilaudid [Hydromorphone Hcl] Nausea And Vomiting   Iodides    Reglan [Metoclopramide] Other (See Comments)    Other reaction(s): Dystonia Hallucinations/Violent   Tramadol    Tape Rash    Blisters     Review of Systems NEGATIVE UNLESS OTHERWISE INDICATED IN HPI      Objective:     BP 101/62 (BP Location: Left Arm)   Pulse 85   Temp 97.6 F (36.4 C) (Temporal)   Ht '5\' 5"'$  (1.651 m)   Wt 282 lb 9.6 oz (128.2 kg)   LMP  (LMP Unknown)   SpO2 95%   BMI 47.03 kg/m   Wt Readings from Last  3 Encounters:  03/12/22 282 lb 9.6 oz (128.2 kg)  12/31/21 284 lb 12.8 oz (129.2 kg)  12/12/21 283 lb 3.2 oz (128.5 kg)    BP Readings from Last 3 Encounters:  03/12/22 101/62  12/31/21 112/73  12/12/21 108/69     Physical Exam Vitals and nursing note reviewed.  Constitutional:      Appearance: Normal appearance. She is obese. She is not toxic-appearing.  HENT:     Head: Normocephalic and atraumatic.     Right Ear: External ear normal.     Left Ear: External ear normal.     Nose: Nose normal.     Mouth/Throat:     Mouth: Mucous membranes are moist.  Eyes:     Extraocular Movements: Extraocular movements intact.     Conjunctiva/sclera: Conjunctivae normal.     Pupils: Pupils are equal, round, and reactive to light.  Cardiovascular:     Rate and Rhythm: Normal rate and regular rhythm.     Pulses: Normal pulses.     Heart sounds: Normal heart sounds.  Pulmonary:     Effort: Pulmonary effort is normal.     Breath sounds: Normal breath sounds.  Musculoskeletal:     Cervical back: Normal range of  motion and neck supple.     Comments: L great toe some tenderness diffusely with palpation. N/V intact. No erythema.  Skin:    General: Skin is warm and dry.  Neurological:     Mental Status: She is alert and oriented to person, place, and time.  Psychiatric:        Mood and Affect: Mood normal.        Behavior: Behavior normal.        Assessment & Plan:   Problem List Items Addressed This Visit       Endocrine   Hypothyroidism (acquired), stable on Levothyroxine     Other   Nicotine dependence, cigarrettes, precontemplative   Relevant Orders   DG Chest 2 View   Morbid (severe) obesity due to excess calories (HCC)   Anxiety, uses prn Xanax   Other Visit Diagnoses     Type 2 diabetes mellitus with hyperglycemia, without long-term current use of insulin (HCC)    -  Primary   Relevant Orders   DG Chest 2 View   DG Toe Great Left   Pulmonary nodules       Relevant Orders   DG Chest 2 View   Great toe pain, left       Relevant Orders   DG Toe Great Left   Hot flashes       Relevant Orders   DG Chest 2 View       1. Type 2 diabetes mellitus with hyperglycemia, without long-term current use of insulin (HCC) Lab Results  Component Value Date   HGBA1C 7.4 (H) 12/31/2021   Cont to monitor glucose intermittently Cont to work on lifestyle Cont regular medication regimen- Invokana 300 mg, Tradjenta 5 mg qd  2. Anxiety, uses prn Xanax PDMP reviewed today, no red flags, filling appropriately.  Stable, not due for refills  3. Morbid (severe) obesity due to excess calories ([place]) Cont to work on lifestyle  4. Hypothyroidism (acquired), stable on Levothyroxine Stable on levothyroxine 100 mcg daily  5. Pulmonary nodules Reviewed patient's CT from 02/15/19, she is due for repeat imaging, discussed this with her again today  6. Cigarette nicotine dependence without complication Advised her to quit completely  7. Great toe pain, left Strongly  advised XRAY left great  toe; most likely OA, but she is diabetic - need to make sure no signs of osteomyelitis. Order placed today.   8. Hot flashes Concerning as this has been resolved for years and has returned recently. Plan for CXR at the very least. Needs to repeat labs at next visit. Pt to monitor symptoms and let me know if anything changes.    Return in about 3 months (around 06/12/2022) for recheck .  This note was prepared with assistance of Systems analyst. Occasional wrong-word or sound-a-like substitutions may have occurred due to the inherent limitations of voice recognition software.   Telitha Plath M Caran Storck, PA-C

## 2022-03-12 NOTE — Patient Instructions (Signed)
XRAYS [facility] today  Need CT chest to review pulmonary nodules again  Recheck fasting labs at next appointment

## 2022-03-27 ENCOUNTER — Other Ambulatory Visit: Payer: Self-pay | Admitting: Physician Assistant

## 2022-03-28 ENCOUNTER — Telehealth (HOSPITAL_BASED_OUTPATIENT_CLINIC_OR_DEPARTMENT_OTHER): Payer: Self-pay | Admitting: Cardiovascular Disease

## 2022-03-28 ENCOUNTER — Telehealth (HOSPITAL_BASED_OUTPATIENT_CLINIC_OR_DEPARTMENT_OTHER): Payer: Self-pay

## 2022-03-28 MED ORDER — ISOSORBIDE MONONITRATE ER 60 MG PO TB24: 60.0000 mg | ORAL_TABLET | Freq: Every day | ORAL | 1 refills | Status: DC

## 2022-03-28 MED ORDER — ATORVASTATIN CALCIUM 10 MG PO TABS: 10.0000 mg | ORAL_TABLET | Freq: Every day | ORAL | 1 refills | Status: DC

## 2022-03-28 NOTE — Telephone Encounter (Signed)
error 

## 2022-03-28 NOTE — Telephone Encounter (Signed)
*  STAT* If patient is at the pharmacy, call can be transferred to refill team.   1. Which medications need to be refilled? (please list name of each medication and dose if known)   isosorbide mononitrate (IMDUR) 60 MG 24 hr tablet - enough until Sunday  isosorbide mononitrate (IMDUR) 60 MG 24 hr tablet - enough for a week  2. Which pharmacy/location (including street and city if local pharmacy) is medication to be sent to? [facility] [place], [place]  3. Do they need a 30 day or 90 day supply?   90 day   Patient stated she is almost out of this medication.

## 2022-03-28 NOTE — Telephone Encounter (Signed)
Called pt to clarify since Isosorbide listed twice. Pt stated she needs refills on the Isosorbide and Atorvastatin. Informed her that I will send both of them in today for 90 day to [facility] [address]. Pt verbalized thanks!

## 2022-04-23 ENCOUNTER — Other Ambulatory Visit: Payer: Self-pay

## 2022-04-23 ENCOUNTER — Telehealth: Payer: Self-pay | Admitting: Physician Assistant

## 2022-04-23 MED ORDER — BUDESONIDE-FORMOTEROL FUMARATE 80-4.5 MCG/ACT IN AERO: 2.0000 | INHALATION_SPRAY | Freq: Two times a day (BID) | RESPIRATORY_TRACT | 0 refills | Status: DC

## 2022-04-23 MED ORDER — MELOXICAM 7.5 MG PO TABS: ORAL_TABLET | ORAL | 0 refills | Status: DC

## 2022-04-23 MED ORDER — PANTOPRAZOLE SODIUM 40 MG PO TBEC: 40.0000 mg | DELAYED_RELEASE_TABLET | Freq: Every day | ORAL | 1 refills | Status: DC

## 2022-04-23 NOTE — Telephone Encounter (Signed)
Rx sent to pharmacy   

## 2022-04-23 NOTE — Telephone Encounter (Signed)
Pt requests 2 prescriptions to be for 90 days.  If not possible, pt would like a call back about determination.    LAST APPOINTMENT DATE:   03/12/22 OV with PCP   NEXT APPOINTMENT DATE: 06/12/22 OV with PCP   MEDICATION: meloxicam (MOBIC) 7.5 MG tablet [[id number]]   pantoprazole (PROTONIX) 40 MG tablet [[id number]]    Is the patient out of medication?  No, will make it to Sunday (08/06)  PHARMACY: [facility] [place], [address]  [place], [place] [address]  Phone:  [phone]  Fax:  [phone]  DEA #:  [id number]

## 2022-04-23 NOTE — Telephone Encounter (Signed)
..   Encourage patient to contact the pharmacy for refills or they can request refills through [facility]:  03/12/22  NEXT APPOINTMENT DATE: 06/12/22  MEDICATION: budesonide-formoterol (SYMBICORT) 80-4.5 MCG/ACT inhaler   Is the patient out of medication? Yes  [facility] [place], [place]  [address] [place] [address]  Phone:  [phone]  Fax:  [phone]  DEA #:  [id number]  Let patient know to contact pharmacy at the end of the day to make sure medication is ready.  Please notify patient to allow 48-72 hours to process

## 2022-05-07 ENCOUNTER — Other Ambulatory Visit: Payer: Self-pay

## 2022-05-07 ENCOUNTER — Telehealth: Payer: Self-pay | Admitting: Physician Assistant

## 2022-05-07 DIAGNOSIS — J4541 Moderate persistent asthma with (acute) exacerbation: Secondary | ICD-10-CM

## 2022-05-07 MED ORDER — IPRATROPIUM BROMIDE 0.06 % NA SOLN: NASAL | 1 refills | Status: DC

## 2022-05-07 NOTE — Telephone Encounter (Signed)
Rx sent to pharmacy   

## 2022-05-07 NOTE — Telephone Encounter (Signed)
..   Encourage patient to contact the pharmacy for refills or they can request refills through [place]:  03/12/22  NEXT APPOINTMENT DATE: 06/12/22  MEDICATION: ipratropium (ATROVENT) 0.06 % nasal spray   Is the patient out of medication? Has a small amount left  PHARMACY: [facility] [place], [address]  [place], [place] [address]  Phone:  [phone]  Fax:  [phone]  DEA #:  [id number]  Patient would like to have this by 08/19 due to going out of town for funeral.

## 2022-05-19 ENCOUNTER — Other Ambulatory Visit: Payer: Self-pay

## 2022-05-19 ENCOUNTER — Telehealth: Payer: Self-pay | Admitting: Physician Assistant

## 2022-05-19 MED ORDER — BLOOD GLUCOSE MONITOR KIT
PACK | 0 refills | Status: AC
Start: 2022-05-19 — End: ?

## 2022-05-19 NOTE — Telephone Encounter (Signed)
Pt states she has lost her glucose meter and needs a new one ordered. She believes it was an [place].   PHARMACY: [id number] [place], [place] Phone:  [phone]  Fax:  [phone]

## 2022-05-19 NOTE — Telephone Encounter (Signed)
Rx sent to pharmacy   

## 2022-05-28 ENCOUNTER — Telehealth: Payer: Self-pay | Admitting: Physician Assistant

## 2022-05-28 NOTE — Telephone Encounter (Signed)
   LAST APPOINTMENT DATE:   03/12/22 OV with PCP   NEXT APPOINTMENT DATE: 06/12/22 OV   MEDICATION: meloxicam (MOBIC) 7.5 MG tablet [[id number]]    Is the patient out of medication?  Almost, "has one left"   PHARMACY: [facility], [address]  [place], [place] [address]  Phone:  [phone]  Fax:  [phone]  DEA #:  [id number]

## 2022-05-29 ENCOUNTER — Other Ambulatory Visit: Payer: Self-pay

## 2022-05-29 MED ORDER — MELOXICAM 7.5 MG PO TABS: ORAL_TABLET | ORAL | 0 refills | Status: DC

## 2022-05-29 NOTE — Telephone Encounter (Signed)
Rx sent to pharmacy   

## 2022-06-12 ENCOUNTER — Other Ambulatory Visit (INDEPENDENT_AMBULATORY_CARE_PROVIDER_SITE_OTHER): Payer: Medicare HMO

## 2022-06-12 ENCOUNTER — Ambulatory Visit (INDEPENDENT_AMBULATORY_CARE_PROVIDER_SITE_OTHER): Payer: Medicare HMO | Admitting: Physician Assistant

## 2022-06-12 ENCOUNTER — Encounter: Payer: Self-pay | Admitting: Physician Assistant

## 2022-06-12 VITALS — BP 108/66 | HR 75 | Temp 97.8°F | Ht 65.0 in | Wt 279.2 lb

## 2022-06-12 DIAGNOSIS — Z23 Encounter for immunization: Secondary | ICD-10-CM | POA: Diagnosis not present

## 2022-06-12 DIAGNOSIS — E538 Deficiency of other specified B group vitamins: Secondary | ICD-10-CM

## 2022-06-12 DIAGNOSIS — E1169 Type 2 diabetes mellitus with other specified complication: Secondary | ICD-10-CM | POA: Diagnosis not present

## 2022-06-12 DIAGNOSIS — E039 Hypothyroidism, unspecified: Secondary | ICD-10-CM | POA: Diagnosis not present

## 2022-06-12 DIAGNOSIS — N183 Chronic kidney disease, stage 3 unspecified: Secondary | ICD-10-CM | POA: Diagnosis not present

## 2022-06-12 DIAGNOSIS — E785 Hyperlipidemia, unspecified: Secondary | ICD-10-CM

## 2022-06-12 DIAGNOSIS — F17210 Nicotine dependence, cigarettes, uncomplicated: Secondary | ICD-10-CM

## 2022-06-12 DIAGNOSIS — Z1211 Encounter for screening for malignant neoplasm of colon: Secondary | ICD-10-CM | POA: Diagnosis not present

## 2022-06-12 DIAGNOSIS — E118 Type 2 diabetes mellitus with unspecified complications: Secondary | ICD-10-CM

## 2022-06-12 LAB — COMPREHENSIVE METABOLIC PANEL
ALT: 18 U/L (ref 0–35)
AST: 17 U/L (ref 0–37)
Albumin: 3.8 g/dL (ref 3.5–5.2)
Alkaline Phosphatase: 81 U/L (ref 39–117)
BUN: 23 mg/dL (ref 6–23)
CO2: 31 mEq/L (ref 19–32)
Calcium: 10.3 mg/dL (ref 8.4–10.5)
Chloride: 102 mEq/L (ref 96–112)
Creatinine, Ser: 1.46 mg/dL — ABNORMAL HIGH (ref 0.40–1.20)
GFR: 36.63 mL/min — ABNORMAL LOW (ref >60.00)
Glucose, Bld: 133 mg/dL — ABNORMAL HIGH (ref 70–99)
Potassium: 4.6 mEq/L (ref 3.5–5.1)
Sodium: 139 mEq/L (ref 135–145)
Total Bilirubin: 1.1 mg/dL (ref 0.2–1.2)
Total Protein: 7 g/dL (ref 6.0–8.3)

## 2022-06-12 LAB — CBC WITH DIFFERENTIAL/PLATELET
Basophils Absolute: 0 10*3/uL (ref 0.0–0.1)
Basophils Relative: 0.6 % (ref 0.0–3.0)
Eosinophils Absolute: 0.2 10*3/uL (ref 0.0–0.7)
Eosinophils Relative: 2.1 % (ref 0.0–5.0)
HCT: 51.5 % — ABNORMAL HIGH (ref 36.0–46.0)
Hemoglobin: 16.9 g/dL — ABNORMAL HIGH (ref 12.0–15.0)
Lymphocytes Relative: 24 % (ref 12.0–46.0)
Lymphs Abs: 1.8 10*3/uL (ref 0.7–4.0)
MCHC: 32.8 g/dL (ref 30.0–36.0)
MCV: 95 fl (ref 78.0–100.0)
Monocytes Absolute: 0.5 10*3/uL (ref 0.1–1.0)
Monocytes Relative: 6.5 % (ref 3.0–12.0)
Neutro Abs: 5 10*3/uL (ref 1.4–7.7)
Neutrophils Relative %: 66.8 % (ref 43.0–77.0)
Platelets: 149 10*3/uL — ABNORMAL LOW (ref 150.0–400.0)
RBC: 5.42 Mil/uL — ABNORMAL HIGH (ref 3.87–5.11)
RDW: 15.2 % (ref 11.5–15.5)
WBC: 7.4 10*3/uL (ref 4.0–10.5)

## 2022-06-12 LAB — LIPID PANEL
Cholesterol: 122 mg/dL (ref 0–200)
HDL: 41.3 mg/dL (ref >39.00)
LDL Cholesterol: 41 mg/dL (ref 0–99)
NonHDL: 80.2
Total CHOL/HDL Ratio: 3
Triglycerides: 194 mg/dL — ABNORMAL HIGH (ref 0.0–149.0)
VLDL: 38.8 mg/dL (ref 0.0–40.0)

## 2022-06-12 NOTE — Progress Notes (Signed)
Subjective:    Patient ID: [name] [name], female    DOB: 02/05/1951, 71 y.o.   MRN: [id number]  Chief Complaint  Patient presents with   Follow-up    Fasting labs and recheck. Pt wants flu shot    HPI Patient is in today for regular 27-monthfollow-up of chronic conditions, fasting labs, flu shot.  Patient states no significant medical changes since last visit.  Past Medical History:  Diagnosis Date   Bronchitis, asthmatic    Coronary artery disease    BMS to LAD 02/2008   Diabetes mellitus without complication ([place]    Hyperlipidemia    Hypertensive heart disease 04/16/2016   Hypothyroidism    MI, old    Morbid obesity ([place] 04/16/2016   S/P coronary artery stent placement 04/16/2016   BMS to LAD 2009   Tobacco abuse 04/16/2016    Past Surgical History:  Procedure Laterality Date   ABDOMINAL HYSTERECTOMY     35; very heavy bleeding   ANKLE SURGERY Right    CHOLECYSTECTOMY     CORONARY ANGIOPLASTY  02/2008   bare metal stent to LAD    CORONARY STENT PLACEMENT     KNEE ARTHROSCOPY     OOPHORECTOMY Left    age 71  ROTATOR CUFF REPAIR Bilateral    THORACIC OUTLET SURGERY     WRIST SURGERY      Family History  Problem Relation Age of Onset   Cancer Mother    Diabetes Son    Hypertension Son     Social History   Tobacco Use   Smoking status: Light Smoker    Packs/day: 0.50    Years: 31.00    Total pack years: 15.50    Types: Cigarettes   Smokeless tobacco: Never  Vaping Use   Vaping Use: Never used  Substance Use Topics   Alcohol use: No   Drug use: No     Allergies  Allergen Reactions   Clindamycin/Lincomycin Other (See Comments)    Head ache    Codeine Hives   Cortisone    Dilaudid [Hydromorphone Hcl] Nausea And Vomiting   Iodides    Reglan [Metoclopramide] Other (See Comments)    Other reaction(s): Dystonia Hallucinations/Violent   Tramadol    Tape Rash    Blisters     Review of Systems NEGATIVE UNLESS OTHERWISE INDICATED IN HPI       Objective:     BP 108/66 (BP Location: Left Arm, Patient Position: Sitting)   Pulse 75   Temp 97.8 F (36.6 C) (Temporal)   Ht '5\' 5"'$  (1.651 m)   Wt 279 lb 3.2 oz (126.6 kg)   LMP  (LMP Unknown)   SpO2 95%   BMI 46.46 kg/m   Wt Readings from Last 3 Encounters:  06/12/22 279 lb 3.2 oz (126.6 kg)  03/12/22 282 lb 9.6 oz (128.2 kg)  12/31/21 284 lb 12.8 oz (129.2 kg)    BP Readings from Last 3 Encounters:  06/12/22 108/66  03/12/22 101/62  12/31/21 112/73     Physical Exam Vitals and nursing note reviewed.  Constitutional:      Appearance: Normal appearance. She is obese. She is not toxic-appearing.  HENT:     Head: Normocephalic and atraumatic.     Right Ear: External ear normal.     Left Ear: External ear normal.     Nose: Nose normal.     Mouth/Throat:     Mouth: Mucous membranes are moist.  Eyes:  Extraocular Movements: Extraocular movements intact.     Conjunctiva/sclera: Conjunctivae normal.     Pupils: Pupils are equal, round, and reactive to light.  Cardiovascular:     Rate and Rhythm: Normal rate and regular rhythm.     Pulses: Normal pulses.     Heart sounds: Normal heart sounds.  Pulmonary:     Effort: Pulmonary effort is normal.     Breath sounds: Normal breath sounds.  Musculoskeletal:     Cervical back: Normal range of motion and neck supple.  Skin:    General: Skin is warm and dry.  Neurological:     Mental Status: She is alert and oriented to person, place, and time.  Psychiatric:        Mood and Affect: Mood normal.        Behavior: Behavior normal.        Assessment & Plan:  DM (diabetes mellitus) with complications ([place]), -     CBC with Differential/Platelet -     Comprehensive metabolic panel -     Lipid panel -     Vitamin B12 -     Hemoglobin A1c  Stage 3 chronic kidney disease, unspecified whether stage 3a or 3b CKD (HCC) -     Comprehensive metabolic panel  H88 deficiency -     Vitamin B12  Hyperlipidemia associated  with type 2 diabetes mellitus ([place]), on Lipitor -     Lipid panel  Cigarette nicotine dependence without complication  Morbid (severe) obesity due to excess calories (HCC) -     CBC with Differential/Platelet -     Comprehensive metabolic panel -     Lipid panel -     Vitamin B12 -     Hemoglobin A1c  Hypothyroidism (acquired), stable on Levothyroxine -     TSH  Screening for colon cancer -     Cologuard  Need for immunization against influenza -     Flu Vaccine QUAD High Dose([place])        Return in about 6 months (around 12/11/2022) for fasting labs, recheck .  This note was prepared with assistance of Systems analyst. Occasional wrong-word or sound-a-like substitutions may have occurred due to the inherent limitations of voice recognition software.   [name] Cockerham M Job [place], PA-C

## 2022-06-12 NOTE — Patient Instructions (Signed)
Good to see you! Labs today Flu shot today  Please complete Cologuard and mail back   Call if any concerns

## 2022-06-13 LAB — TSH: TSH: 2.64 u[IU]/mL (ref 0.35–5.50)

## 2022-06-13 LAB — VITAMIN B12: Vitamin B-12: >1500 pg/mL — ABNORMAL HIGH (ref 211–911)

## 2022-06-15 NOTE — Assessment & Plan Note (Signed)
Patient currently taking Synthroid 100 mcg, thyroid stable.

## 2022-06-15 NOTE — Assessment & Plan Note (Signed)
Patient continues on B12 1000 mcg oral supplement daily.

## 2022-06-15 NOTE — Assessment & Plan Note (Signed)
Patient normally uses her pool outside in the summertime to help with exercise.  Coming into the fall and winter months and needs to consider alternatives.  Limited by chronic joint pain.  Again encouraged healthy nutrition habits.

## 2022-06-15 NOTE — Assessment & Plan Note (Signed)
Patient smoking about 1/2 pack daily.  Not interested in quitting at this time.

## 2022-06-15 NOTE — Assessment & Plan Note (Signed)
Rechecking CMP today.

## 2022-06-15 NOTE — Assessment & Plan Note (Signed)
Continues on Lipitor 10 mg.  Rechecking lipid panel today.

## 2022-06-15 NOTE — Assessment & Plan Note (Addendum)
Updating fasting labs.  Patient is currently taking Tradjenta 5 mg, Invokana 300 mg.  She could not tolerate metformin nor the GLP-1 injectables.  She may need to consider insulin at some point.

## 2022-06-16 LAB — HEMOGLOBIN A1C: Hgb A1c MFr Bld: 7.3 % — ABNORMAL HIGH (ref 4.6–6.5)

## 2022-06-24 ENCOUNTER — Other Ambulatory Visit: Payer: Self-pay

## 2022-06-24 ENCOUNTER — Telehealth: Payer: Self-pay | Admitting: Physician Assistant

## 2022-06-24 MED ORDER — ALBUTEROL SULFATE 1.25 MG/3ML IN NEBU: INHALATION_SOLUTION | RESPIRATORY_TRACT | 2 refills | Status: DC

## 2022-06-24 NOTE — Telephone Encounter (Signed)
  LAST APPOINTMENT DATE:  Please schedule appointment if longer than 1 year  06/12/22  NEXT APPOINTMENT DATE: 12/11/22  MEDICATION:    albuterol (ACCUNEB) 1.25 MG/3ML nebulizer solution  Is the patient out of medication? No  PHARMACY:  [id number] [address], [place] AT [address] Phone: [phone]  Fax: [phone]     Let patient know to contact pharmacy at the end of the day to make sure medication is ready.  Please notify patient to allow 48-72 hours to process

## 2022-06-24 NOTE — Telephone Encounter (Signed)
Refill sent to pharmacy.   

## 2022-06-25 ENCOUNTER — Other Ambulatory Visit: Payer: Self-pay | Admitting: Physician Assistant

## 2022-06-25 NOTE — Telephone Encounter (Signed)
Last OV: 06/12/22  Next OV: 12/11/22  Filled previously by historical provider please advise if you would like to take over this Rx

## 2022-06-26 ENCOUNTER — Other Ambulatory Visit: Payer: Self-pay | Admitting: Physician Assistant

## 2022-06-26 ENCOUNTER — Telehealth: Payer: Self-pay | Admitting: Physician Assistant

## 2022-06-26 NOTE — Telephone Encounter (Signed)
  LAST APPOINTMENT DATE:  06/12/22 OV with PCP  NEXT APPOINTMENT DATE: 12/11/2022  MEDICATION: meloxicam (MOBIC) 7.5 MG tablet [[id number]]    Is the patient out of medication?  Not Yet, will run out 06/26/22   PHARMACY: [facility], [address], [place] [address] Phone: [phone]  Fax: [phone] DEA #: [id number]

## 2022-06-27 ENCOUNTER — Other Ambulatory Visit: Payer: Self-pay

## 2022-06-27 NOTE — Telephone Encounter (Signed)
Rx sent to pharmacy   

## 2022-07-04 ENCOUNTER — Other Ambulatory Visit: Payer: Self-pay | Admitting: Physician Assistant

## 2022-07-04 DIAGNOSIS — F419 Anxiety disorder, unspecified: Secondary | ICD-10-CM

## 2022-07-04 NOTE — Telephone Encounter (Signed)
  LAST APPOINTMENT DATE: 06/12/22  NEXT APPOINTMENT DATE: 12/11/22  MEDICATION: ALPRAZolam (XANAX) 0.5 MG tablet   Is the patient out of medication? Has enough until 10/16  PHARMACY: [facility], [address], [place] [address] Phone: [phone]  Fax: [phone]

## 2022-07-07 MED ORDER — ALPRAZOLAM 0.5 MG PO TABS: ORAL_TABLET | ORAL | 0 refills | Status: DC

## 2022-07-07 NOTE — Telephone Encounter (Signed)
Last OV: 06/12/22  Next OV: 12/11/22  Last filled: 10/24/21  Quantity: 60

## 2022-07-18 ENCOUNTER — Telehealth: Payer: Self-pay | Admitting: Physician Assistant

## 2022-07-18 DIAGNOSIS — R600 Localized edema: Secondary | ICD-10-CM

## 2022-07-18 MED ORDER — FUROSEMIDE 20 MG PO TABS: ORAL_TABLET | ORAL | 1 refills | Status: DC

## 2022-07-18 NOTE — Telephone Encounter (Signed)
Pt notified Rx for Lasix sent to the pharmacy.

## 2022-07-18 NOTE — Telephone Encounter (Signed)
.   Encourage patient to contact the pharmacy for refills or they can request refills through [place]:  11/2022  NEXT APPOINTMENT DATE: MEDICATION:   Is the patient out of medication? furosemide (LASIX) 20 MG tablet   PHARMACY:[facility] on file   Let patient know to contact pharmacy at the end of the day to make sure medication is ready.  Please notify patient to allow 48-72 hours to process   Yes

## 2022-07-21 ENCOUNTER — Other Ambulatory Visit: Payer: Self-pay | Admitting: Physician Assistant

## 2022-07-24 ENCOUNTER — Telehealth: Payer: Self-pay | Admitting: Physician Assistant

## 2022-07-24 NOTE — Telephone Encounter (Signed)
Pt asks: -Can these prescriptions be written for 90-days?    LAST APPOINTMENT DATE:   06/12/22 OV with PCP   NEXT APPOINTMENT DATE: 12/10/21 OV with PCP   MEDICATION: meloxicam (MOBIC) 7.5 MG tablet [[id number]]   levothyroxine (SYNTHROID) 100 MCG tablet [[id number]]    Is the patient out of medication?  Almost, has approx. 4 days left  PHARMACY: [facility] [place], [address] AT [address], [place] [address] Phone: [phone]  Fax: [phone] DEA #: [id number]

## 2022-07-25 ENCOUNTER — Other Ambulatory Visit: Payer: Self-pay

## 2022-07-25 MED ORDER — MELOXICAM 7.5 MG PO TABS: 7.5000 mg | ORAL_TABLET | Freq: Every day | ORAL | 1 refills | Status: DC

## 2022-07-25 MED ORDER — LEVOTHYROXINE SODIUM 100 MCG PO TABS: ORAL_TABLET | ORAL | 1 refills | Status: DC

## 2022-07-25 NOTE — Telephone Encounter (Signed)
Rx sent to pharmacy   

## 2022-07-30 DIAGNOSIS — Z1211 Encounter for screening for malignant neoplasm of colon: Secondary | ICD-10-CM | POA: Diagnosis not present

## 2022-08-08 LAB — COLOGUARD: COLOGUARD: NEGATIVE

## 2022-08-16 ENCOUNTER — Other Ambulatory Visit: Payer: Self-pay | Admitting: Physician Assistant

## 2022-08-18 ENCOUNTER — Telehealth: Payer: Self-pay | Admitting: Physician Assistant

## 2022-08-18 NOTE — Telephone Encounter (Signed)
Patient states: -She has been experiencing wheezing and chest congestion for a couple of days  - Described experiencing a shortness of breath   I offered patient to speak with our triage nurse but she declined. After informing Hasna, Clinical team lead, she informed me to have patient scheduled for tomorrow and stress to patient that if sx worsen to go to ED.   Due to full schedule for PCP/PCP office on 11/27 & 11/28 and patient declining to visit another [place] office, she has been scheduled with Jeanie Sewer on 11/29 @ 10am. Pt stated she will either call or go to ED if symptoms worsen.

## 2022-08-18 NOTE — Telephone Encounter (Signed)
Please see FYI on patient scheduled to see Hudnell on 08/20/22.

## 2022-08-19 NOTE — Telephone Encounter (Signed)
Noted and agreed, thank you. 

## 2022-08-20 ENCOUNTER — Ambulatory Visit: Payer: Medicare HMO | Admitting: Family

## 2022-08-20 NOTE — Telephone Encounter (Signed)
Patient states: -She is feeling too bad to come in for 11/29 OV w/ Colletta [place] Hudnell  - Sinuses have worsened but breathing is not affected  - Would like to come in tomorrow   Patient has been rescheduled for 11/30 @ 11:20am w/ Jeanie Sewer since PCP is full.

## 2022-08-21 ENCOUNTER — Ambulatory Visit: Payer: Medicare HMO | Admitting: Family

## 2022-08-21 ENCOUNTER — Ambulatory Visit (INDEPENDENT_AMBULATORY_CARE_PROVIDER_SITE_OTHER): Payer: Medicare HMO | Admitting: Family

## 2022-08-21 ENCOUNTER — Encounter: Payer: Self-pay | Admitting: Family

## 2022-08-21 VITALS — BP 138/78 | HR 76 | Temp 97.3°F | Ht 65.0 in | Wt 284.6 lb

## 2022-08-21 DIAGNOSIS — J441 Chronic obstructive pulmonary disease with (acute) exacerbation: Secondary | ICD-10-CM | POA: Diagnosis not present

## 2022-08-21 DIAGNOSIS — J45901 Unspecified asthma with (acute) exacerbation: Secondary | ICD-10-CM

## 2022-08-21 MED ORDER — PREDNISONE 20 MG PO TABS: ORAL_TABLET | ORAL | 0 refills | Status: DC

## 2022-08-21 MED ORDER — PROMETHAZINE-CODEINE 6.25-10 MG/5ML PO SYRP: 7.5000 mL | ORAL_SOLUTION | Freq: Four times a day (QID) | ORAL | 0 refills | Status: DC | PRN

## 2022-08-21 NOTE — Progress Notes (Signed)
 Patient ID: [name] [name], female    DOB: 11/24/1950, 71 y.o.   MRN: [id number]  Chief Complaint  Patient presents with   URI    Pt in office for wheezing and thinks she may have URI; clear mucus in cough; may have pulled a muscle from coughing so much. Had symptoms for over 2 weeks; not left home in over a month, so doesn't think it is Covid related; husband was buring a glade pine candle and triggered asthma been coughing since.    HPI:      Asthma exacerbation:  Pt in office for wheezing and thinks she may have URI; clear mucus in cough; may have pulled a muscle from coughing so much. Had symptoms for over 2 weeks; not left home in over a month, so doesn't think it is Covid related; husband was buring a glade pine candle and triggered asthma been coughing since. Using Symbicort and nebulizer as directed. Pt is still smoking.    Assessment & Plan:  1. Asthma exacerbation in COPD (HCC) lungs w/bronchial sounds, no wheezing audible today, sending low dose prednisone, advised on use & SE - pt will monitor blood sugar avoid simple carbs & sweets, sending cough syrup refill, but advised pt the focus is controlling her asthma sx w/inhalers & neb use, increasing water intake, pt does not drink daily.   - promethazine-codeine (PHENERGAN WITH CODEINE) 6.25-10 MG/5ML syrup; Take 7.5 mLs by mouth every 6 (six) hours as needed for cough.  Dispense: 120 mL; Refill: 0 - predniSONE (DELTASONE) 20 MG tablet; Take 2 pills in the morning with breakfast for 3 days, then 1 pill for 2 days  Dispense: 8 tablet; Refill: 0    Subjective:    Outpatient Medications Prior to Visit  Medication Sig Dispense Refill   Accu-Chek FastClix Lancets MISC USE TO TEST UP TO FOUR TIMES DAILY AS DIRECTED 306 each 2   ACCU-CHEK GUIDE test strip USE AS DIRECTED TO TEST BLOOD SUGAR DAILY 200 strip 3   albuterol (ACCUNEB) 1.25 MG/3ML nebulizer solution TAKE 3 MLS BY NEBULIZATION THREE TIMES DAILY AS NEEDED FOR WHEEZING 75 mL 2    albuterol (VENTOLIN HFA) 108 (90 Base) MCG/ACT inhaler Inhale 2 puffs into the lungs every 6 (six) hours as needed for wheezing or shortness of breath. 8 g 3   ALPRAZolam (XANAX) 0.5 MG tablet TAKE 1 TABLET(0.5 MG) BY MOUTH TWICE DAILY AS NEEDED FOR ANXIETY 60 tablet 0   aspirin 81 MG tablet Take 81 mg by mouth daily.     atorvastatin (LIPITOR) 10 MG tablet Take 1 tablet (10 mg total) by mouth daily. 90 tablet 1   blood glucose meter kit and supplies KIT Dispense based on patient and insurance preference. Use up to four times daily as directed. DX E11.8 1 each 0   Calcium Carb-Cholecalciferol (CALCIUM 500 + D3 PO) Take by mouth.     furosemide (LASIX) 20 MG tablet Taking 3 times a week 90 tablet 1   INVOKANA 300 MG TABS tablet TAKE 1 TABLET(300 MG) BY MOUTH DAILY BEFORE BREAKFAST 90 tablet 1   ipratropium (ATROVENT) 0.06 % nasal spray USE 2 SPRAYS IN EACH NOSTRIL FOUR TIMES DAILY AS DIRECTED 45 mL 1   ipratropium-albuterol (DUONEB) 0.5-2.5 (3) MG/3ML SOLN Take 3 mLs by nebulization every 6 (six) hours as needed. 360 mL 0   isosorbide mononitrate (IMDUR) 60 MG 24 hr tablet Take 1 tablet (60 mg total) by mouth daily. 90 tablet 1   levothyroxine (  SYNTHROID) 100 MCG tablet TAKE 1 TABLET(100 MCG) BY MOUTH DAILY 90 tablet 1   meloxicam (MOBIC) 7.5 MG tablet Take 1 tablet (7.5 mg total) by mouth daily. 90 tablet 1   Menthol, Topical Analgesic, (BIOFREEZE) 4 % GEL Apply topically.     pantoprazole (PROTONIX) 40 MG tablet Take 1 tablet (40 mg total) by mouth daily. 90 tablet 1   SYMBICORT 80-4.5 MCG/ACT inhaler INHALE 2 PUFFS INTO THE LUNGS TWICE DAILY 30.6 g 1   TRADJENTA 5 MG TABS tablet TAKE 1 TABLET(5 MG) BY MOUTH DAILY 90 tablet 1   vitamin B-12 (CYANOCOBALAMIN) 1000 MCG tablet Take 1,000 mcg by mouth daily.     No facility-administered medications prior to visit.   Past Medical History:  Diagnosis Date   Bronchitis, asthmatic    Coronary artery disease    BMS to LAD 02/2008   Diabetes mellitus  without complication (HCC)    Hyperlipidemia    Hypertensive heart disease 04/16/2016   Hypothyroidism    MI, old    Morbid obesity (HCC) 04/16/2016   S/P coronary artery stent placement 04/16/2016   BMS to LAD 2009   Tobacco abuse 04/16/2016   Past Surgical History:  Procedure Laterality Date   ABDOMINAL HYSTERECTOMY     35; very heavy bleeding   ANKLE SURGERY Right    CHOLECYSTECTOMY     CORONARY ANGIOPLASTY  02/2008   bare metal stent to LAD    CORONARY STENT PLACEMENT     KNEE ARTHROSCOPY     OOPHORECTOMY Left    age 35   ROTATOR CUFF REPAIR Bilateral    THORACIC OUTLET SURGERY     WRIST SURGERY     Allergies  Allergen Reactions   Clindamycin/Lincomycin Other (See Comments)    Head ache    Codeine Hives   Cortisone    Dilaudid [Hydromorphone Hcl] Nausea And Vomiting   Iodides    Reglan [Metoclopramide] Other (See Comments)    Other reaction(s): Dystonia Hallucinations/Violent   Tramadol    Tape Rash    Blisters       Objective:    Physical Exam Vitals and nursing note reviewed.  Constitutional:      Appearance: Normal appearance. She is morbidly obese.  Cardiovascular:     Rate and Rhythm: Normal rate and regular rhythm.  Pulmonary:     Effort: Pulmonary effort is normal.     Breath sounds: Normal breath sounds.     Comments: bronchial lung sounds in bases Musculoskeletal:        General: Normal range of motion.  Skin:    General: Skin is warm and dry.  Neurological:     Mental Status: She is alert.  Psychiatric:        Mood and Affect: Mood normal.        Behavior: Behavior normal.    BP 138/78 (BP Location: Left Arm)   Pulse 76   Temp (!) 97.3 F (36.3 C) (Temporal)   Ht 5' 5" (1.651 m)   Wt 284 lb 9.6 oz (129.1 kg)   LMP  (LMP Unknown)   SpO2 96%   BMI 47.36 kg/m  Wt Readings from Last 3 Encounters:  08/21/22 284 lb 9.6 oz (129.1 kg)  06/12/22 279 lb 3.2 oz (126.6 kg)  03/12/22 282 lb 9.6 oz (128.2 kg)       Hudnell, Stephanie,  NP  

## 2022-09-03 ENCOUNTER — Telehealth: Payer: Self-pay | Admitting: Physician Assistant

## 2022-09-03 NOTE — Telephone Encounter (Signed)
LAST APPOINTMENT DATE:  06/12/22 (w/PCP)  NEXT APPOINTMENT DATE: 12/11/22  MEDICATION: ALPRAZolam (XANAX) 0.5 MG tablet   Is the patient out of medication? Has a few pills left  PHARMACY: [facility] [place], [address], [place] [address] Phone: [phone]  Fax: [phone]

## 2022-09-04 NOTE — Telephone Encounter (Signed)
Last OV: 06/12/22  Next OV: 12/11/22  Last filled: 07/07/22  Quantity: 60 w/ 0 refills

## 2022-09-05 ENCOUNTER — Other Ambulatory Visit: Payer: Self-pay | Admitting: Physician Assistant

## 2022-09-05 DIAGNOSIS — F419 Anxiety disorder, unspecified: Secondary | ICD-10-CM

## 2022-09-05 MED ORDER — ALPRAZOLAM 0.5 MG PO TABS: ORAL_TABLET | ORAL | 2 refills | Status: DC

## 2022-09-10 ENCOUNTER — Other Ambulatory Visit: Payer: Self-pay | Admitting: Physician Assistant

## 2022-09-10 DIAGNOSIS — J4541 Moderate persistent asthma with (acute) exacerbation: Secondary | ICD-10-CM

## 2022-09-18 ENCOUNTER — Other Ambulatory Visit (HOSPITAL_BASED_OUTPATIENT_CLINIC_OR_DEPARTMENT_OTHER): Payer: Self-pay | Admitting: Cardiovascular Disease

## 2022-09-19 ENCOUNTER — Ambulatory Visit: Payer: Medicare HMO | Admitting: Physician Assistant

## 2022-10-21 ENCOUNTER — Other Ambulatory Visit: Payer: Self-pay | Admitting: Physician Assistant

## 2022-11-10 ENCOUNTER — Ambulatory Visit: Payer: Medicare HMO | Admitting: Physician Assistant

## 2022-12-03 ENCOUNTER — Other Ambulatory Visit: Payer: Self-pay

## 2022-12-03 ENCOUNTER — Telehealth: Payer: Self-pay | Admitting: Physician Assistant

## 2022-12-03 MED ORDER — ALBUTEROL SULFATE 1.25 MG/3ML IN NEBU: INHALATION_SOLUTION | RESPIRATORY_TRACT | 2 refills | Status: DC

## 2022-12-03 NOTE — Telephone Encounter (Signed)
RX Sent. 

## 2022-12-03 NOTE — Telephone Encounter (Signed)
  Encourage patient to contact the pharmacy for refills or they can request refills through [place]:  Please schedule appointment if longer than 1 year  NEXT APPOINTMENT DATE:  MEDICATION:  albuterol (VENTOLIN HFA) 108 (90 Base) MCG/ACT inhaler   Is the patient out of medication? Almost  PHARMACY: [facility] [place], [address]  [place], [place] [address]   Let patient know to contact pharmacy at the end of the day to make sure medication is ready.  Please notify patient to allow 48-72 hours to process

## 2022-12-11 ENCOUNTER — Ambulatory Visit: Payer: Medicare HMO | Admitting: Physician Assistant

## 2022-12-24 ENCOUNTER — Ambulatory Visit: Payer: Medicare HMO | Admitting: Physician Assistant

## 2022-12-25 ENCOUNTER — Other Ambulatory Visit: Payer: Self-pay | Admitting: Physician Assistant

## 2022-12-25 ENCOUNTER — Telehealth: Payer: Self-pay | Admitting: Physician Assistant

## 2022-12-25 NOTE — Telephone Encounter (Signed)
Please advise 

## 2022-12-25 NOTE — Telephone Encounter (Signed)
  Encourage patient to contact the pharmacy for refills or they can request refills through [place]:  Please schedule appointment if longer than 1 year  NEXT APPOINTMENT DATE:  MEDICATION:  INVOKANA 300 MG TABS tablet   Is the patient out of medication? YES  PHARMACY:  [id number] - [place], [place]    Let patient know to contact pharmacy at the end of the day to make sure medication is ready.  Please notify patient to allow 48-72 hours to process

## 2022-12-26 ENCOUNTER — Other Ambulatory Visit: Payer: Self-pay | Admitting: Physician Assistant

## 2022-12-26 MED ORDER — CANAGLIFLOZIN 300 MG PO TABS: ORAL_TABLET | ORAL | 1 refills | Status: DC

## 2022-12-31 ENCOUNTER — Other Ambulatory Visit: Payer: Self-pay

## 2022-12-31 ENCOUNTER — Telehealth: Payer: Self-pay | Admitting: Physician Assistant

## 2022-12-31 MED ORDER — BUDESONIDE-FORMOTEROL FUMARATE 80-4.5 MCG/ACT IN AERO: 2.0000 | INHALATION_SPRAY | Freq: Two times a day (BID) | RESPIRATORY_TRACT | 1 refills | Status: DC

## 2022-12-31 NOTE — Telephone Encounter (Signed)
Medication sent to the pharmacy.

## 2022-12-31 NOTE — Telephone Encounter (Signed)
Prescription Request  12/31/2022  LOV: 06/12/2022  What is the name of the medication or equipment? SYMBICORT 80-4.5 MCG/ACT inhaler   Have you contacted your pharmacy to request a refill? Yes   Which pharmacy would you like this sent to?  [facility] #[id number] [address], [place] - [phone] AT [facility] & [address] [place] [address] Phone: [phone] Fax: [phone]    Patient notified that their request is being sent to the clinical staff for review and that they should receive a response within 2 business days.   Please advise at Mobile [phone] (mobile)

## 2023-01-02 ENCOUNTER — Other Ambulatory Visit (INDEPENDENT_AMBULATORY_CARE_PROVIDER_SITE_OTHER): Payer: Medicare HMO

## 2023-01-02 ENCOUNTER — Other Ambulatory Visit: Payer: Self-pay

## 2023-01-02 ENCOUNTER — Emergency Department (HOSPITAL_BASED_OUTPATIENT_CLINIC_OR_DEPARTMENT_OTHER): Payer: Medicare HMO

## 2023-01-02 ENCOUNTER — Ambulatory Visit (INDEPENDENT_AMBULATORY_CARE_PROVIDER_SITE_OTHER)
Admission: RE | Admit: 2023-01-02 | Discharge: 2023-01-02 | Disposition: A | Discharge status: Home / Self Care | Payer: Medicare HMO | Source: Ambulatory Visit | Attending: Physician Assistant | Admitting: Physician Assistant

## 2023-01-02 ENCOUNTER — Emergency Department (HOSPITAL_BASED_OUTPATIENT_CLINIC_OR_DEPARTMENT_OTHER)
Admission: EM | Admit: 2023-01-02 | Discharge: 2023-01-02 | Disposition: A | Discharge status: Home / Self Care | Payer: Medicare HMO | Attending: Emergency Medicine | Admitting: Emergency Medicine

## 2023-01-02 ENCOUNTER — Emergency Department (HOSPITAL_BASED_OUTPATIENT_CLINIC_OR_DEPARTMENT_OTHER): Payer: Medicare HMO | Admitting: Radiology

## 2023-01-02 ENCOUNTER — Encounter: Payer: Self-pay | Admitting: Physician Assistant

## 2023-01-02 ENCOUNTER — Ambulatory Visit (INDEPENDENT_AMBULATORY_CARE_PROVIDER_SITE_OTHER): Payer: Medicare HMO | Admitting: Physician Assistant

## 2023-01-02 VITALS — BP 96/62 | HR 84 | Temp 97.5°F | Ht 65.0 in | Wt 292.6 lb

## 2023-01-02 DIAGNOSIS — I2510 Atherosclerotic heart disease of native coronary artery without angina pectoris: Secondary | ICD-10-CM | POA: Insufficient documentation

## 2023-01-02 DIAGNOSIS — Z79899 Other long term (current) drug therapy: Secondary | ICD-10-CM | POA: Diagnosis not present

## 2023-01-02 DIAGNOSIS — E039 Hypothyroidism, unspecified: Secondary | ICD-10-CM | POA: Insufficient documentation

## 2023-01-02 DIAGNOSIS — R0602 Shortness of breath: Secondary | ICD-10-CM | POA: Diagnosis not present

## 2023-01-02 DIAGNOSIS — E119 Type 2 diabetes mellitus without complications: Secondary | ICD-10-CM | POA: Diagnosis not present

## 2023-01-02 DIAGNOSIS — R0609 Other forms of dyspnea: Secondary | ICD-10-CM

## 2023-01-02 DIAGNOSIS — E1165 Type 2 diabetes mellitus with hyperglycemia: Secondary | ICD-10-CM | POA: Diagnosis not present

## 2023-01-02 DIAGNOSIS — R059 Cough, unspecified: Secondary | ICD-10-CM | POA: Diagnosis not present

## 2023-01-02 DIAGNOSIS — N183 Chronic kidney disease, stage 3 unspecified: Secondary | ICD-10-CM | POA: Insufficient documentation

## 2023-01-02 DIAGNOSIS — Z7982 Long term (current) use of aspirin: Secondary | ICD-10-CM | POA: Insufficient documentation

## 2023-01-02 DIAGNOSIS — J449 Chronic obstructive pulmonary disease, unspecified: Secondary | ICD-10-CM | POA: Diagnosis not present

## 2023-01-02 DIAGNOSIS — I129 Hypertensive chronic kidney disease with stage 1 through stage 4 chronic kidney disease, or unspecified chronic kidney disease: Secondary | ICD-10-CM | POA: Diagnosis not present

## 2023-01-02 LAB — TROPONIN I (HIGH SENSITIVITY)
High Sens Troponin I: 6 ng/L (ref 2–17)
Troponin I (High Sensitivity): 5 ng/L (ref <18)

## 2023-01-02 LAB — CBC WITH DIFFERENTIAL/PLATELET
Basophils Absolute: 0.1 10*3/uL (ref 0.0–0.1)
Basophils Relative: 0.7 % (ref 0.0–3.0)
Eosinophils Absolute: 0.2 10*3/uL (ref 0.0–0.7)
Eosinophils Relative: 2 % (ref 0.0–5.0)
HCT: 52.4 % — ABNORMAL HIGH (ref 36.0–46.0)
Hemoglobin: 17.4 g/dL — ABNORMAL HIGH (ref 12.0–15.0)
Lymphocytes Relative: 18.9 % (ref 12.0–46.0)
Lymphs Abs: 1.5 10*3/uL (ref 0.7–4.0)
MCHC: 33.3 g/dL (ref 30.0–36.0)
MCV: 94.7 fl (ref 78.0–100.0)
Monocytes Absolute: 0.6 10*3/uL (ref 0.1–1.0)
Monocytes Relative: 7.2 % (ref 3.0–12.0)
Neutro Abs: 5.5 10*3/uL (ref 1.4–7.7)
Neutrophils Relative %: 71.2 % (ref 43.0–77.0)
Platelets: 157 10*3/uL (ref 150.0–400.0)
RBC: 5.53 Mil/uL — ABNORMAL HIGH (ref 3.87–5.11)
RDW: 15.1 % (ref 11.5–15.5)
WBC: 7.8 10*3/uL (ref 4.0–10.5)

## 2023-01-02 LAB — COMPREHENSIVE METABOLIC PANEL
ALT: 23 U/L (ref 0–35)
AST: 18 U/L (ref 0–37)
Albumin: 4 g/dL (ref 3.5–5.2)
Alkaline Phosphatase: 85 U/L (ref 39–117)
BUN: 22 mg/dL (ref 6–23)
CO2: 31 mEq/L (ref 19–32)
Calcium: 10.2 mg/dL (ref 8.4–10.5)
Chloride: 101 mEq/L (ref 96–112)
Creatinine, Ser: 1.46 mg/dL — ABNORMAL HIGH (ref 0.40–1.20)
GFR: 36.49 mL/min — ABNORMAL LOW (ref >60.00)
Glucose, Bld: 186 mg/dL — ABNORMAL HIGH (ref 70–99)
Potassium: 4.7 mEq/L (ref 3.5–5.1)
Sodium: 140 mEq/L (ref 135–145)
Total Bilirubin: 0.9 mg/dL (ref 0.2–1.2)
Total Protein: 7.1 g/dL (ref 6.0–8.3)

## 2023-01-02 LAB — POCT GLYCOSYLATED HEMOGLOBIN (HGB A1C): Hemoglobin A1C: 7.3 % — AB (ref 4.0–5.6)

## 2023-01-02 LAB — D-DIMER, QUANTITATIVE: D-Dimer, Quant: 0.9 mcg/mL FEU — ABNORMAL HIGH (ref <0.50)

## 2023-01-02 LAB — MAGNESIUM: Magnesium: 1.7 mg/dL (ref 1.5–2.5)

## 2023-01-02 LAB — BRAIN NATRIURETIC PEPTIDE: Pro B Natriuretic peptide (BNP): 41 pg/mL (ref 0.0–100.0)

## 2023-01-02 MED ORDER — METHYLPREDNISOLONE SODIUM SUCC 40 MG IJ SOLR
40.0000 mg | Freq: Once | INTRAMUSCULAR | Status: AC
  Administered 2023-01-02: 40 mg via INTRAVENOUS
  Filled 2023-01-02: qty 1

## 2023-01-02 MED ORDER — IOHEXOL 350 MG/ML SOLN
100.0000 mL | Freq: Once | INTRAVENOUS | Status: AC | PRN
  Administered 2023-01-02: 75 mL via INTRAVENOUS

## 2023-01-02 MED ORDER — DIPHENHYDRAMINE HCL 25 MG PO CAPS
50.0000 mg | ORAL_CAPSULE | Freq: Once | ORAL | Status: AC
  Administered 2023-01-02: 50 mg via ORAL
  Filled 2023-01-02: qty 2

## 2023-01-02 MED ORDER — PREDNISONE 10 MG PO TABS: 10.0000 mg | ORAL_TABLET | Freq: Every day | ORAL | 0 refills | Status: AC

## 2023-01-02 MED ORDER — DIPHENHYDRAMINE HCL 50 MG/ML IJ SOLN: 50.0000 mg | Freq: Once | INTRAMUSCULAR | Status: AC

## 2023-01-02 MED ORDER — ACETAMINOPHEN 500 MG PO TABS
1000.0000 mg | ORAL_TABLET | Freq: Once | ORAL | Status: AC
  Administered 2023-01-02: 1000 mg via ORAL
  Filled 2023-01-02: qty 2

## 2023-01-02 MED ORDER — IOHEXOL 350 MG/ML SOLN: 100.0000 mL | Freq: Once | INTRAVENOUS | Status: DC | PRN

## 2023-01-02 NOTE — ED Provider Notes (Signed)
EMERGENCY DEPARTMENT AT [facility] Provider Note   CSN: [id number] Arrival date & time: 01/02/23  1511     History  Chief Complaint  Patient presents with   Shortness of Breath    [name] [name] is a 72 y.o. female with HTN, CAD with stents, COPD, hypothyroidism, OSA, polycythemia, hepatic steatosis, CKD stage III, HLD, gout, lower extremity edema, anxiety who presents with SOB.  Patient arrives to ED with c/o SOB. Was seen by [facility] today who sent her here for elevated D-dimer. Having DOE x 2 weeks. No SOB at rest, no orthopnea. No CP. Hx asthmatic bronchitis, smoker. Using rescue inhaler more than usual, at least 4 times per day, which does help but the DOE comes back. Not sure if it's pollen related or not. Hardly smoking anymore per patient. She is having a lot more phlegm, clear and thick. Some rattling sound in chest. No swelling in the legs. No recent sicknesses. Uses Symbicort daily as directed. No hx of clotting disorders, no hx of PE or DVT.   Labs done earlier today demonstrate CMP at baseline, proBNP 41, WBC 7.8, hemoglobin 17.4, D-dimer 0.9, hsTrop 6.    Shortness of Breath      Home Medications Prior to Admission medications   Medication Sig Start Date End Date Taking? Authorizing Provider  Accu-Chek FastClix Lancets MISC USE TO TEST UP TO FOUR TIMES DAILY AS DIRECTED 11/18/19   Orland Mustard, MD  ACCU-CHEK GUIDE test strip USE AS DIRECTED TO TEST BLOOD SUGAR DAILY 11/01/21   Allwardt, Alyssa M, PA-C  albuterol (ACCUNEB) 1.25 MG/3ML nebulizer solution TAKE 3 MLS BY NEBULIZATION THREE TIMES DAILY AS NEEDED FOR WHEEZING 12/03/22   Allwardt, Alyssa M, PA-C  albuterol (VENTOLIN HFA) 108 (90 Base) MCG/ACT inhaler Inhale 2 puffs into the lungs every 6 (six) hours as needed for wheezing or shortness of breath. 01/13/22   Allwardt, Crist Infante, PA-C  ALPRAZolam (XANAX) 0.5 MG tablet TAKE 1 TABLET(0.5 MG) BY MOUTH TWICE DAILY AS NEEDED FOR ANXIETY 09/05/22    Allwardt, Crist Infante, PA-C  aspirin 81 MG tablet Take 81 mg by mouth daily.    [provider]  atorvastatin (LIPITOR) 10 MG tablet TAKE 1 TABLET(10 MG) BY MOUTH DAILY 09/18/22   Chilton Si, MD  blood glucose meter kit and supplies KIT Dispense based on patient and insurance preference. Use up to four times daily as directed. DX E11.8 05/19/22   Allwardt, Crist Infante, PA-C  budesonide-formoterol (SYMBICORT) 80-4.5 MCG/ACT inhaler Inhale 2 puffs into the lungs 2 (two) times daily. 12/31/22   Allwardt, Crist Infante, PA-C  Calcium Carb-Cholecalciferol (CALCIUM 500 + D3 PO) Take by mouth.    [provider]  canagliflozin (INVOKANA) 300 MG TABS tablet TAKE 1 TABLET(300 MG) BY MOUTH DAILY BEFORE BREAKFAST 12/26/22   Allwardt, Alyssa M, PA-C  furosemide (LASIX) 20 MG tablet Taking 3 times a week Patient taking differently: Taking 1 times a week 07/18/22   Allwardt, Alyssa M, PA-C  ipratropium (ATROVENT) 0.06 % nasal spray USE 2 SPRAYS IN EACH NOSTRIL FOUR TIMES DAILY AS DIRECTED 09/11/22   Allwardt, Alyssa M, PA-C  ipratropium-albuterol (DUONEB) 0.5-2.5 (3) MG/3ML SOLN Take 3 mLs by nebulization every 6 (six) hours as needed. 07/20/19   Orland Mustard, MD  isosorbide mononitrate (IMDUR) 60 MG 24 hr tablet TAKE 1 TABLET(60 MG) BY MOUTH DAILY 09/18/22   Chilton Si, MD  levothyroxine (SYNTHROID) 100 MCG tablet TAKE 1 TABLET(100 MCG) BY MOUTH DAILY 07/25/22  Allwardt, Alyssa M, PA-C  meloxicam (MOBIC) 7.5 MG tablet Take 1 tablet (7.5 mg total) by mouth daily. 07/25/22   Allwardt, Crist Infante, PA-C  Menthol, Topical Analgesic, (BIOFREEZE) 4 % GEL Apply topically.    [provider]  pantoprazole (PROTONIX) 40 MG tablet TAKE 1 TABLET(40 MG) BY MOUTH DAILY 10/21/22   Allwardt, Alyssa M, PA-C  TRADJENTA 5 MG TABS tablet TAKE 1 TABLET(5 MG) BY MOUTH DAILY 08/18/22   Allwardt, Alyssa M, PA-C  vitamin B-12 (CYANOCOBALAMIN) 1000 MCG tablet Take 1,000 mcg by mouth daily.    [provider]      Allergies    Clindamycin/lincomycin, Codeine, Cortisone, Dilaudid [hydromorphone hcl], Iodides, Reglan [metoclopramide], Tramadol, and Tape    Review of Systems   Review of Systems  Respiratory:  Positive for shortness of breath.    Review of systems Negative for f/c.  A 10 point review of systems was performed and is negative unless otherwise reported in HPI.  Physical Exam Updated Vital Signs BP 123/66   Pulse 75   Temp 99 F (37.2 C) (Oral)   Resp 13   LMP  (LMP Unknown)   SpO2 95%  Physical Exam General: Chronically ill-appearing obese female, lying in bed.  HEENT: PERRLA, Sclera anicteric, MMM, trachea midline.  Cardiology: RRR, no murmurs/rubs/gallops. BL radial and DP pulses equal bilaterally.  Resp: Normal respiratory rate and effort. CTAB, no wheezes, rhonchi, crackles.  Abd: Soft, non-tender, non-distended. No rebound tenderness or guarding.  GU: Deferred. MSK: No peripheral edema or signs of trauma. Extremities without deformity or TTP. No cyanosis or clubbing. Skin: warm, dry. Neuro: A&Ox4, CNs II-XII grossly intact. MAEs. Sensation grossly intact.  Psych: Normal mood and affect.   ED Results / Procedures / Treatments   Labs (all labs ordered are listed, but only abnormal results are displayed) Labs Reviewed  TROPONIN I (HIGH SENSITIVITY)    EKG EKG Interpretation  Date/Time:  Friday January 02 2023 16:10:35 EDT Ventricular Rate:  81 PR Interval:  149 QRS Duration: 97 QT Interval:  386 QTC Calculation: 448 R Axis:   112 Text Interpretation: Sinus rhythm Right axis deviation Low voltage, precordial leads Confirmed by Vivi Barrack [phone]) on 01/02/2023 9:48:48 PM  Radiology DG Chest 2 View  Result Date: 01/02/2023 CLINICAL DATA:  Cough and shortness of breath for 2 weeks. EXAM: CHEST - 2 VIEW COMPARISON:  Chest radiograph 02/15/2019 FINDINGS: The cardiomediastinal silhouette is stable, with unchanged borderline cardiomegaly. There is no focal  consolidation or pulmonary edema. There is no pleural effusion or pneumothorax There is no acute osseous abnormality. Surgical clips are noted in the right neck. IMPRESSION: No radiographic evidence of acute cardiopulmonary process. Electronically Signed   By: Lesia Hausen M.D.   On: 01/02/2023 12:09    Procedures Procedures    Medications Ordered in ED Medications  methylPREDNISolone sodium succinate (SOLU-MEDROL) 40 mg/mL injection 40 mg (40 mg Intravenous Given 01/02/23 1653)  diphenhydrAMINE (BENADRYL) capsule 50 mg (50 mg Oral Given 01/02/23 1936)    Or  diphenhydrAMINE (BENADRYL) injection 50 mg ( Intravenous See Alternative 01/02/23 1936)  iohexol (OMNIPAQUE) 350 MG/ML injection 100 mL (75 mLs Intravenous Contrast Given 01/02/23 2054)  acetaminophen (TYLENOL) tablet 1,000 mg (1,000 mg Oral Given 01/02/23 2153)    ED Course/ Medical Decision Making/ A&P                          Medical Decision Making Amount and/or Complexity of Data  Reviewed Labs: ordered. Decision-making details documented in ED Course. Radiology: ordered. Decision-making details documented in ED Course.  Risk OTC drugs. Prescription drug management.    This patient presents to the ED for concern of DOE, this involves an extensive number of treatment options, and is a complaint that carries with it a high risk of complications and morbidity.  I considered the following differential and admission for this acute, potentially life threatening condition.   MDM:    DDX for dyspnea includes but is not limited to:  Greatest suspicion for asthma exacerbation given report of inhalers helping her. Also consider PE, given elevated D-dimer. It is <1 with no hemoptysis or signs/symptoms of DVT, could consider ruling out by years criteria, however patient was told she needed CT scan and it is not unreasonable to scan, so will get CT PE today. Had BNP earlier which was negative, also had negative trop but will draw the repeat  here today. Also had CXR that showed no PTX, pulm edema, PNA, or pleural effusion. Consider valvular heart disease though no murmurs on exam. Patient has IV contrast allergy and so will undergo emergency prep.  She will be treated with Solu-Medrol -which will also help treat a likely asthma exacerbation - and Benadryl.  Already had reassuring lab workup but will repeat a troponin in order to trend from her earlier value.   Clinical Course as of 01/09/23 1214  Fri Jan 02, 2023  2148 Troponin I (High Sensitivity): 5 Flat from earlier [HN]  2148 CT Angio Chest PE W and/or Wo Contrast 1. No evidence of pulmonary embolism. No acute abnormality in the chest. 2. Coronary artery calcification.   [HN]    Clinical Course User Index [HN] Loetta Rough, MD    Labs: I Ordered, and personally interpreted labs.  The pertinent results include:  those listed above  Imaging Studies ordered: I ordered imaging studies including CT PE I independently visualized and interpreted imaging. I agree with the radiologist interpretation  Additional history obtained from chart review, husband at bedside.    Cardiac Monitoring: The patient was maintained on a cardiac monitor.  I personally viewed and interpreted the cardiac monitored which showed an underlying rhythm of: NSR  Reevaluation: After the interventions noted above, I reevaluated the patient and found that they have :improved  Social Determinants of Health: Patient lives independently   Disposition: On reassessment, patient states that her symptoms have mildly improved while she has waited in the ED after her IV contrast steroid prep.  She has negative CT PE and flat troponin.  I offered patient DuoNebs but she states she can take those at home.  Patient will be discharged with prednisone burst for likely asthma exacerbation.  She states the 20 mg of prednisone burst she has received in the past has made her very agitated and irritable, she only  agrees to do prednisone burst if it is a lesser dose.  Will prescribe 10 mg p.o. prednisone x 5 days, instructed to schedule nebulizers q4-6h for the next 2 to 3 days, and have patient follow-up with her primary care physician. DC w/ discharge instructions/return precautions. All questions answered to patient's satisfaction.    Co morbidities that complicate the patient evaluation  Past Medical History:  Diagnosis Date   Bronchitis, asthmatic    Coronary artery disease    BMS to LAD 02/2008   Diabetes mellitus without complication    Hyperlipidemia    Hypertensive heart disease 04/16/2016   Hypothyroidism  MI, old    Morbid obesity 04/16/2016   S/P coronary artery stent placement 04/16/2016   BMS to LAD 2009   Tobacco abuse 04/16/2016     Medicines No orders of the defined types were placed in this encounter.   I have reviewed the patients home medicines and have made adjustments as needed  Problem List / ED Course: Problem List Items Addressed This Visit   None               This note was created using dictation software, which may contain spelling or grammatical errors.    Loetta Rough, MD 01/09/23 [phone]

## 2023-01-02 NOTE — Progress Notes (Unsigned)
Subjective:    Patient ID: [name] [name], female    DOB: 1951/05/06, 72 y.o.   MRN: [id number]  Chief Complaint  Patient presents with   Diabetes    Pt in the office for Diabetes management follow up and A1C check; pt c/o having issues breathing, states out of breath not knowing if related to allergies and pollen and weight gain; pt states not doing diabetic eye exam until taking off cataracts;     Diabetes   Patient is in today for T2DM management / recheck, concerns about weight gain. Here with husband today.   Patient's main concern today - SOB x 2 weeks. Hx asthmatic bronchitis, smoker. Using rescue inhaler more than usual, at least 4 times per day. Not sure if it's pollen related or not. Hardly smoking anymore per patient. She is having a lot more phlegm, clear and thick per pt. No CP or heaviness. Some rattling sound in chest. No swelling in the legs. No recent sicknesses. Worse with exertion.  Uses Symbicort daily as directed.   No hx of clotting disorders, no hx of PE or DVT.   Taking diabetes meds as directed. Potato chip weakness.   Past Medical History:  Diagnosis Date   Bronchitis, asthmatic    Coronary artery disease    BMS to LAD 02/2008   Diabetes mellitus without complication    Hyperlipidemia    Hypertensive heart disease 04/16/2016   Hypothyroidism    MI, old    Morbid obesity 04/16/2016   S/P coronary artery stent placement 04/16/2016   BMS to LAD 2009   Tobacco abuse 04/16/2016    Past Surgical History:  Procedure Laterality Date   ABDOMINAL HYSTERECTOMY     35; very heavy bleeding   ANKLE SURGERY Right    CHOLECYSTECTOMY     CORONARY ANGIOPLASTY  02/2008   bare metal stent to LAD    CORONARY STENT PLACEMENT     KNEE ARTHROSCOPY     OOPHORECTOMY Left    age 8   ROTATOR CUFF REPAIR Bilateral    THORACIC OUTLET SURGERY     WRIST SURGERY      Family History  Problem Relation Age of Onset   Cancer Mother    Diabetes Son    Hypertension Son      Social History   Tobacco Use   Smoking status: Light Smoker    Packs/day: 0.50    Years: 31.00    Additional pack years: 0.00    Total pack years: 15.50    Types: Cigarettes   Smokeless tobacco: Never  Vaping Use   Vaping Use: Never used  Substance Use Topics   Alcohol use: No   Drug use: No     Allergies  Allergen Reactions   Clindamycin/Lincomycin Other (See Comments)    Head ache    Codeine Hives   Cortisone    Dilaudid [Hydromorphone Hcl] Nausea And Vomiting   Iodides    Reglan [Metoclopramide] Other (See Comments)    Other reaction(s): Dystonia Hallucinations/Violent   Tramadol    Tape Rash    Blisters     Review of Systems NEGATIVE UNLESS OTHERWISE INDICATED IN HPI      Objective:     BP 96/62 (BP Location: Left Arm)   Pulse 84   Temp (!) 97.5 F (36.4 C) (Temporal)   Ht 5\' 5"  (1.651 m)   Wt 292 lb 9.6 oz (132.7 kg)   LMP  (LMP Unknown)   SpO2 96%  BMI 48.69 kg/m   Wt Readings from Last 3 Encounters:  01/02/23 292 lb 9.6 oz (132.7 kg)  08/21/22 284 lb 9.6 oz (129.1 kg)  06/12/22 279 lb 3.2 oz (126.6 kg)    BP Readings from Last 3 Encounters:  01/02/23 (!) 153/83  01/02/23 96/62  08/21/22 138/78     Physical Exam Vitals and nursing note reviewed.  Constitutional:      Appearance: Normal appearance. She is morbidly obese.  HENT:     Mouth/Throat:     Mouth: Mucous membranes are moist.  Eyes:     Extraocular Movements: Extraocular movements intact.     Conjunctiva/sclera: Conjunctivae normal.     Pupils: Pupils are equal, round, and reactive to light.  Cardiovascular:     Rate and Rhythm: Normal rate and regular rhythm.     Pulses: Normal pulses.     Heart sounds: No murmur heard. Pulmonary:     Breath sounds: Decreased breath sounds and wheezing (slight wheeze diffuse) present.  Musculoskeletal:     Right lower leg: No edema.     Left lower leg: No edema.  Skin:    General: Skin is warm.     Findings: No rash.   Neurological:     General: No focal deficit present.     Mental Status: She is alert and oriented to person, place, and time.  Psychiatric:        Mood and Affect: Mood normal.        Behavior: Behavior normal.        Assessment & Plan:  Shortness of breath -     EKG 12-Lead -     CBC with Differential/Platelet; Future -     Comprehensive metabolic panel; Future -     Magnesium; Future -     Troponin I (High Sensitivity); Future -     Brain natriuretic peptide; Future -     D-dimer, quantitative; Future -     DG Chest 2 View; Future  Type 2 diabetes mellitus with hyperglycemia, without long-term current use of insulin Assessment & Plan: Updating fasting labs.  Patient is currently taking Tradjenta 5 mg, Invokana 300 mg.  She could not tolerate metformin nor the GLP-1 injectables.  She may need to consider insulin at some point. A1c is stable today. Needs to work on diet still.  Requested referral to podiatry, sent this today.   Lab Results  Component Value Date   HGBA1C 7.3 (A) 01/02/2023     Orders: -     POCT glycosylated hemoglobin (Hb A1C) -     Ambulatory referral to Podiatry -     EKG 12-Lead  Morbid (severe) obesity due to excess calories Assessment & Plan: Patient normally uses her pool outside in the summertime to help with exercise, otherwise very minimal. Limited by chronic joint pain. Again encouraged healthy nutrition habits.     New SOB - likely COPD / asthma exacerbation; however, with being high-risk patient and new symptoms for her, will check STAT labs, CXR. EKG per my review in office today is not concerning for acute cardiac issues.  Pt agrees to ED if severely worse / change in symptoms or if any concerning lab results.     Return in about 3 months (around 04/03/2023) for recheck .     Ayauna Mcnay M Davyon Fisch, PA-C

## 2023-01-02 NOTE — ED Notes (Signed)
Patient transported to CT 

## 2023-01-02 NOTE — Patient Instructions (Signed)
[facility]  - BASEMENT LEVEL [address] [place], [place] [address] Tel: [phone] Hours: M-F 8:30 am - 5:00 pm Closed for lunch 12:30 pm - 1:00 pm  If acute / worsening symptoms, go straight to the ED!

## 2023-01-02 NOTE — Discharge Instructions (Addendum)
Thank you for coming to [facility] Emergency Department. You were seen for shortness of breath with exertion. We did an exam, labs, and imaging, and these showed likely an asthma exacerbation. Please use your nebulizers scheduled every 4-6 hours for a few days followed by as originally prescribed. Please take 10 mg prednisone for 5 days.   Please follow up with your primary care provider within 1 week.   Do not hesitate to return to the ED or call 911 if you experience: -Worsening symptoms -Shortness of breath -Chest pain -Lightheadedness, passing out -Fevers/chills -Anything else that concerns you

## 2023-01-02 NOTE — ED Triage Notes (Signed)
Pt arrives to ED with c/o SOB. Was seen by [facility] today who sent her here for elevated D-dimer.

## 2023-01-04 NOTE — Assessment & Plan Note (Signed)
Patient normally uses her pool outside in the summertime to help with exercise, otherwise very minimal. Limited by chronic joint pain. Again encouraged healthy nutrition habits.

## 2023-01-04 NOTE — Assessment & Plan Note (Signed)
Updating fasting labs.  Patient is currently taking Tradjenta 5 mg, Invokana 300 mg.  She could not tolerate metformin nor the GLP-1 injectables.  She may need to consider insulin at some point. A1c is stable today. Needs to work on diet still.  Requested referral to podiatry, sent this today.   Lab Results  Component Value Date   HGBA1C 7.3 (A) 01/02/2023

## 2023-01-05 ENCOUNTER — Telehealth: Payer: Self-pay | Admitting: Physician Assistant

## 2023-01-05 NOTE — Telephone Encounter (Signed)
Pt would like a call back concerting a medication that she is taking. Please advise.

## 2023-01-05 NOTE — Telephone Encounter (Signed)
Pt called in reference to being on 10 mg Prednisone and glucose readings being 300 this afternoon after taking her prescribed dose. Advised it will make sugar increase to please monitor while taking. Pt agrees she will drink more water as well. Has three days left on steroid. Pt states she is feeling better since office visit.

## 2023-01-06 NOTE — Telephone Encounter (Signed)
Noted and agreed, thank you. 

## 2023-01-07 ENCOUNTER — Other Ambulatory Visit: Payer: Self-pay | Admitting: Physician Assistant

## 2023-01-07 ENCOUNTER — Telehealth: Payer: Self-pay | Admitting: *Deleted

## 2023-01-07 NOTE — Telephone Encounter (Signed)
        Patient  visited [facility] on 4/112024  for treatment    Telephone encounter attempt :  1st  A HIPAA compliant voice message was left requesting a return call.  Instructed patient to call back at [phone]. Yehuda Mao Greenauer -[facility] [place], Population Health [phone] [address] [place] , [place] [address] Email : [email or web address]

## 2023-01-08 ENCOUNTER — Telehealth: Payer: Self-pay | Admitting: *Deleted

## 2023-01-08 NOTE — Telephone Encounter (Signed)
     Patient  visit on 01/02/2023  at [facility] ed  was for treatment   Have you been able to follow up with your primary care physician? Doing better not huffing and puffing will see Dr if needed , staying inside . Will see PCP if needed has transportation and medicine  The patient was able to obtain any needed medicine or equipment.  Are there diet recommendations that you are having difficulty following? Na   Patient expresses understanding of discharge instructions and education provided has no other needs at this time.  Yes  Alois Cliche -[facility] [place], Population Health [phone] [address] [place] , [place] [address] Email : [email or web address]

## 2023-01-14 ENCOUNTER — Ambulatory Visit: Payer: Medicare HMO | Admitting: Podiatry

## 2023-01-14 ENCOUNTER — Encounter: Payer: Self-pay | Admitting: Podiatry

## 2023-01-14 DIAGNOSIS — M79674 Pain in right toe(s): Secondary | ICD-10-CM | POA: Diagnosis not present

## 2023-01-14 DIAGNOSIS — B351 Tinea unguium: Secondary | ICD-10-CM

## 2023-01-14 DIAGNOSIS — M79675 Pain in left toe(s): Secondary | ICD-10-CM

## 2023-01-14 NOTE — Progress Notes (Signed)
  Subjective:  Patient ID: [name] [name], female    DOB: Jul 13, 1951,  MRN: [id number]  Chief Complaint  Patient presents with   Diabetes    Nail trim    72 y.o. female returns for the above complaint.  Patient presents with thickened elongated dystrophic mycotic toenails x 10 mild pain on palpation hurts with ambulation worse with pressure.  Objective:  There were no vitals filed for this visit. Podiatric Exam: Vascular: dorsalis pedis and posterior tibial pulses are palpable bilateral. Capillary return is immediate. Temperature gradient is WNL. Skin turgor WNL  Sensorium: Normal Semmes Weinstein monofilament test. Normal tactile sensation bilaterally. Nail Exam: Pt has thick disfigured discolored nails with subungual debris noted bilateral entire nail hallux through fifth toenails.  Pain on palpation to the nails. Ulcer Exam: There is no evidence of ulcer or pre-ulcerative changes or infection. Orthopedic Exam: Muscle tone and strength are WNL. No limitations in general ROM. No crepitus or effusions noted.  Skin: No Porokeratosis. No infection or ulcers    Assessment & Plan:   1. Pain due to onychomycosis of toenails of both feet     Patient was evaluated and treated and all questions answered.  Onychomycosis with pain  -Nails palliatively debrided as below. -Educated on self-care  Procedure: Nail Debridement Rationale: pain  Type of Debridement: manual, sharp debridement. Instrumentation: Nail nipper, rotary burr. Number of Nails: 10  Procedures and Treatment: Consent by patient was obtained for treatment procedures. The patient understood the discussion of treatment and procedures well. All questions were answered thoroughly reviewed. Debridement of mycotic and hypertrophic toenails, 1 through 5 bilateral and clearing of subungual debris. No ulceration, no infection noted.  Return Visit-Office Procedure: Patient instructed to return to the office for a follow up visit 3 months  for continued evaluation and treatment.  Nicholes Rough, DPM    Return in about 3 months (around 04/15/2023).

## 2023-01-16 ENCOUNTER — Other Ambulatory Visit: Payer: Self-pay

## 2023-01-16 ENCOUNTER — Telehealth: Payer: Self-pay | Admitting: Physician Assistant

## 2023-01-16 MED ORDER — MELOXICAM 7.5 MG PO TABS: 7.5000 mg | ORAL_TABLET | Freq: Every day | ORAL | 1 refills | Status: DC

## 2023-01-16 NOTE — Telephone Encounter (Signed)
Prescription Request  01/16/2023  LOV: 01/02/2023  What is the name of the medication or equipment? meloxicam (MOBIC) 7.5 MG tablet  (90 Day Supply)  Have you contacted your pharmacy to request a refill? Yes   Which pharmacy would you like this sent to?  [id number] [place], [place] - [phone] AT [facility] OF [facility] & [address] [place] [address] Phone: [phone] Fax: [phone]      Patient notified that their request is being sent to the clinical staff for review and that they should receive a response within 2 business days.   Please advise at [facility] [phone]

## 2023-01-16 NOTE — Telephone Encounter (Signed)
Rx sent to pharmacy for patient.

## 2023-01-19 ENCOUNTER — Other Ambulatory Visit: Payer: Self-pay | Admitting: Physician Assistant

## 2023-01-19 DIAGNOSIS — J4541 Moderate persistent asthma with (acute) exacerbation: Secondary | ICD-10-CM

## 2023-01-22 ENCOUNTER — Ambulatory Visit (HOSPITAL_BASED_OUTPATIENT_CLINIC_OR_DEPARTMENT_OTHER): Payer: Medicare HMO | Admitting: Cardiovascular Disease

## 2023-01-22 ENCOUNTER — Other Ambulatory Visit: Payer: Self-pay | Admitting: Physician Assistant

## 2023-01-27 ENCOUNTER — Telehealth: Payer: Self-pay | Admitting: Physician Assistant

## 2023-01-27 NOTE — Telephone Encounter (Signed)
FYI: This call has been transferred to Access Nurse. Once the result note has been entered staff can address the message at that time.  Patient called in with the following symptoms:  Red Word:elevated blood pressure   Please advise at [facility] [phone]  Message is routed to Provider Pool and [facility] Triage

## 2023-01-28 NOTE — Telephone Encounter (Signed)
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 She is having issues with her eyes and she is nauseous. Current blood sugar is 152 Translation No Nurse Assessment Nurse: Delford Field, RN, Ivin Booty Date/Time (Eastern Time): 01/27/2023 3:29:03 PM Confirm and document reason for call. If symptomatic, describe symptoms. ---Caller states that her blood sugar is 152. She is having blurred vision and she is nauseous. Does the patient have any new or worsening symptoms? ---Yes Will a triage be completed? ---Yes Related visit to physician within the last 2 weeks? ---Yes Does the PT have any chronic conditions? (i.e. diabetes, asthma, this includes High risk factors for pregnancy, etc.) ---Yes List chronic conditions. ---asthma, bronchitis, DM II Is this a behavioral health or substance abuse call? ---No Guidelines Guideline Title Affirmed Question Affirmed Notes Nurse Date/Time (Eastern Time) Vision Loss or Change [1] Blurred vision or visual changes AND [2] present now AND [3] sudden onset or new (e.g., minutes, hours, days) (Exception: Seeing floaters / black Delford Field, RN, Ivin Booty 01/27/2023 3:33:32 PM Guidelines Guideline Title Affirmed Question Affirmed Notes Nurse Date/Time (Eastern Time) specks OR previously diagnosed migraine headaches  with same symptoms.) Disp. Time Lamount Cohen Time) Disposition Final User 01/27/2023 3:35:16 PM Go to ED Now (or PCP triage) Yes Delford Field, RN, Ivin Booty Final Disposition 01/27/2023 3:35:16 PM Go to ED Now (or PCP triage) Yes Delford Field, RN, Madelynn Done Disagree/Comply Comply Caller Understands Yes PreDisposition Did not know what to do Care Advice Given Per Guideline GO TO ED NOW (OR PCP TRIAGE): * IF NO PCP (PRIMARY CARE PROVIDER) SECOND-LEVEL TRIAGE: You need to be seen within the next hour. Go to the ED/UCC at _____________ Hospital. Leave as soon as you can. CARE ADVICE given per Vision Loss or Change (Adult) guideline. Referrals GO TO FACILITY UNDECIDED

## 2023-01-28 NOTE — Telephone Encounter (Signed)
Please see triage notes for patient and advise

## 2023-01-28 NOTE — Telephone Encounter (Signed)
Noted and agreed, thank you. 

## 2023-02-03 ENCOUNTER — Ambulatory Visit (INDEPENDENT_AMBULATORY_CARE_PROVIDER_SITE_OTHER): Payer: Medicare HMO | Admitting: Physician Assistant

## 2023-02-03 ENCOUNTER — Encounter: Payer: Self-pay | Admitting: Physician Assistant

## 2023-02-03 VITALS — BP 122/66 | HR 83 | Ht 65.0 in | Wt 293.0 lb

## 2023-02-03 DIAGNOSIS — E1165 Type 2 diabetes mellitus with hyperglycemia: Secondary | ICD-10-CM | POA: Diagnosis not present

## 2023-02-03 DIAGNOSIS — Z7984 Long term (current) use of oral hypoglycemic drugs: Secondary | ICD-10-CM | POA: Diagnosis not present

## 2023-02-03 DIAGNOSIS — J449 Chronic obstructive pulmonary disease, unspecified: Secondary | ICD-10-CM

## 2023-02-03 DIAGNOSIS — H1011 Acute atopic conjunctivitis, right eye: Secondary | ICD-10-CM

## 2023-02-03 DIAGNOSIS — Z23 Encounter for immunization: Secondary | ICD-10-CM | POA: Diagnosis not present

## 2023-02-03 MED ORDER — OLOPATADINE HCL 0.2 % OP SOLN
1.0000 [drp] | Freq: Two times a day (BID) | OPHTHALMIC | 0 refills | Status: DC
Start: 2023-02-03 — End: 2024-05-18

## 2023-02-03 MED ORDER — IPRATROPIUM-ALBUTEROL 0.5-2.5 (3) MG/3ML IN SOLN
3.0000 mL | Freq: Four times a day (QID) | RESPIRATORY_TRACT | 2 refills | Status: DC | PRN
Start: 2023-02-03 — End: 2023-03-17

## 2023-02-03 MED ORDER — GLIPIZIDE ER 2.5 MG PO TB24
2.5000 mg | ORAL_TABLET | Freq: Every day | ORAL | 2 refills | Status: DC
Start: 2023-02-03 — End: 2023-02-20

## 2023-02-03 NOTE — Progress Notes (Unsigned)
Subjective:    Patient ID: [name] [name], female    DOB: July 23, 1951, 72 y.o.   MRN: [id number]  Chief Complaint  Patient presents with   Diabetes   Medical Management of Chronic Issues    Pt in the office for diabetes f/u; pt c/o still coughing and sugar running high; wants to discuss medication and recent increase in cost;     HPI Patient is in today for discussion about diabetes medications.  Here with her husband today.   Tradjenta cost was over $100 for 30 day supply recently; pt did pick this med up, but cannot afford this every month.   GLP1 injections made her horribly constipated. Cannot take Metformin because of renal function.   Glucose fasting this morning was 174  Past Medical History:  Diagnosis Date   Bronchitis, asthmatic    Coronary artery disease    BMS to LAD 02/2008   Diabetes mellitus without complication (HCC)    Hyperlipidemia    Hypertensive heart disease 04/16/2016   Hypothyroidism    MI, old    Morbid obesity (HCC) 04/16/2016   S/P coronary artery stent placement 04/16/2016   BMS to LAD 2009   Tobacco abuse 04/16/2016    Past Surgical History:  Procedure Laterality Date   ABDOMINAL HYSTERECTOMY     35; very heavy bleeding   ANKLE SURGERY Right    CHOLECYSTECTOMY     CORONARY ANGIOPLASTY  02/2008   bare metal stent to LAD    CORONARY STENT PLACEMENT     KNEE ARTHROSCOPY     OOPHORECTOMY Left    age 4   ROTATOR CUFF REPAIR Bilateral    THORACIC OUTLET SURGERY     WRIST SURGERY      Family History  Problem Relation Age of Onset   Cancer Mother    Diabetes Son    Hypertension Son     Social History   Tobacco Use   Smoking status: Light Smoker    Packs/day: 0.50    Years: 31.00    Additional pack years: 0.00    Total pack years: 15.50    Types: Cigarettes   Smokeless tobacco: Never  Vaping Use   Vaping Use: Never used  Substance Use Topics   Alcohol use: No   Drug use: No     Allergies  Allergen Reactions    Clindamycin/Lincomycin Other (See Comments)    Head ache    Codeine Hives   Cortisone    Dilaudid [Hydromorphone Hcl] Nausea And Vomiting   Iodides    Reglan [Metoclopramide] Other (See Comments)    Other reaction(s): Dystonia Hallucinations/Violent   Tramadol    Tape Rash    Blisters     Review of Systems NEGATIVE UNLESS OTHERWISE INDICATED IN HPI      Objective:     BP 122/66 (BP Location: Left Arm)   Pulse 83   Ht 5\' 5"  (1.651 m)   Wt 293 lb (132.9 kg)   LMP  (LMP Unknown)   SpO2 94%   BMI 48.76 kg/m   Wt Readings from Last 3 Encounters:  02/03/23 293 lb (132.9 kg)  01/02/23 292 lb 9.6 oz (132.7 kg)  08/21/22 284 lb 9.6 oz (129.1 kg)    BP Readings from Last 3 Encounters:  02/03/23 122/66  01/02/23 (!) 153/83  01/02/23 96/62     Physical Exam Vitals and nursing note reviewed.  Constitutional:      Appearance: Normal appearance. She is morbidly obese.  HENT:     Mouth/Throat:     Mouth: Mucous membranes are moist.  Eyes:     Extraocular Movements: Extraocular movements intact.     Pupils: Pupils are equal, round, and reactive to light.     Comments: Some irritation of R conjunctiva noted  Cardiovascular:     Rate and Rhythm: Normal rate and regular rhythm.     Pulses: Normal pulses.     Heart sounds: No murmur heard. Pulmonary:     Breath sounds: Wheezing (slight wheeze diffuse) present. No decreased breath sounds.  Musculoskeletal:     Right lower leg: No edema.     Left lower leg: No edema.  Skin:    General: Skin is warm.     Findings: No rash.  Neurological:     General: No focal deficit present.     Mental Status: She is alert and oriented to person, place, and time.  Psychiatric:        Mood and Affect: Mood normal.        Behavior: Behavior normal.        Assessment & Plan:  Type 2 diabetes mellitus with hyperglycemia, without long-term current use of insulin (HCC) Assessment & Plan: Patient is currently taking Tradjenta 5 mg,  Invokana 300 mg.  However, Tradjenta too expensive at this time. Plan to try Glipizide XL 2.5 mg daily in place of this. Cautioned hypoglycemia. She could not tolerate metformin nor the GLP-1 injectables.  She may need to consider insulin at some point. A1c stable. Needs to work on diet still.  Lab Results  Component Value Date   HGBA1C 7.3 (A) 01/02/2023   HGBA1C 7.3 (H) 06/12/2022   HGBA1C 7.4 (H) 12/31/2021     Orders: -     glipiZIDE ER; Take 1 tablet (2.5 mg total) by mouth daily with breakfast.  Dispense: 30 tablet; Refill: 2 -     AMB Referral to Chronic Care Management Services  COPD GOLD 0/ prob AB still smoking  -     AMB Referral to Chronic Care Management Services -     Ipratropium-Albuterol; Take 3 mLs by nebulization every 6 (six) hours as needed.  Dispense: 360 mL; Refill: 2  Allergic conjunctivitis of right eye -     Olopatadine HCl; Apply 1 drop to eye 2 (two) times daily.  Dispense: 2.5 mL; Refill: 0  Need for pneumococcal vaccine -     Pneumococcal conjugate vaccine 20-valent        Return in about 3 months (around 05/06/2023) for recheck/follow-up.    Eian Vandervelden M Jeshurun Oaxaca, PA-C

## 2023-02-03 NOTE — Patient Instructions (Addendum)
Good to see you today!   CCM services will be reaching out to you and hopefully able to assist with medication management / cost.  Use Duoneb treatments for your COPD flare up. Would like to avoid steroids to keep sugar down.  May try Glipizide and see if this is a better cost for you than Tradjenta; need to monitor / avoid hypoglycemia events  Keep me posted!

## 2023-02-04 NOTE — Assessment & Plan Note (Signed)
Patient is currently taking Tradjenta 5 mg, Invokana 300 mg.  However, Tradjenta too expensive at this time. Plan to try Glipizide XL 2.5 mg daily in place of this. Cautioned hypoglycemia. She could not tolerate metformin nor the GLP-1 injectables.  She may need to consider insulin at some point. A1c stable. Needs to work on diet still.  Lab Results  Component Value Date   HGBA1C 7.3 (A) 01/02/2023   HGBA1C 7.3 (H) 06/12/2022   HGBA1C 7.4 (H) 12/31/2021

## 2023-02-05 ENCOUNTER — Telehealth: Payer: Self-pay

## 2023-02-05 NOTE — Progress Notes (Signed)
  Chronic Care Management   Note  02/05/2023 Name: [name] [name] MRN: [id number] DOB: [birth year]  [name] [name] is a 72 y.o. year old female who is a primary care patient of Allwardt, Alyssa M, PA-C. I reached out to [facility] by phone today in response to a referral sent by [name] [name]'s PCP.  [name] [name] was given information about Chronic Care Management services today including:  CCM service includes personalized support from designated clinical staff supervised by the physician, including individualized plan of care and coordination with other care providers 24/7 contact phone numbers for assistance for urgent and routine care needs. Service will only be billed when office clinical staff spend 20 minutes or more in a month to coordinate care. Only one practitioner may furnish and bill the service in a calendar month. The patient may stop CCM services at amy time (effective at the end of the month) by phone call to the office staff. The patient will be responsible for cost sharing (co-pay) or up to 20% of the service fee (after annual deductible is met)  [name] [name]  agreedto scheduling an appointment with the CCM RN Case Manager   Follow up plan: Patient agreed to scheduled appointment with RN Case Manager on 02/11/2023 pharm d 02/17/2023(date/time).   Penne Lash, RMA Care Guide [facility]  [place], [place] [address] Direct Dial: [phone] [email or web address]

## 2023-02-09 ENCOUNTER — Telehealth: Payer: Self-pay | Admitting: Physician Assistant

## 2023-02-09 ENCOUNTER — Ambulatory Visit (INDEPENDENT_AMBULATORY_CARE_PROVIDER_SITE_OTHER): Payer: Medicare HMO

## 2023-02-09 VITALS — Wt 293.0 lb

## 2023-02-09 DIAGNOSIS — Z0000 Encounter for general adult medical examination without abnormal findings: Secondary | ICD-10-CM | POA: Diagnosis not present

## 2023-02-09 NOTE — Patient Instructions (Signed)
[name] [name] , Thank you for taking time to come for your Medicare Wellness Visit. I appreciate your ongoing commitment to your health goals. Please review the following plan we discussed and let me know if I can assist you in the future.   These are the goals we discussed:  Goals      Patient Stated     Lose weight      Patient Stated     Lose weight      Patient Stated     Lose weight         This is a list of the screening recommended for you and due dates:  Health Maintenance  Topic Date Due   Complete foot exam   11/08/2021   Yearly kidney health urinalysis for diabetes  05/20/2022   Eye exam for diabetics  11/21/2022   Zoster (Shingles) Vaccine (1 of 2) 04/03/2023*   Flu Shot  04/23/2023   Hemoglobin A1C  07/04/2023   Yearly kidney function blood test for diabetes  01/02/2024   Mammogram  01/02/2024   Medicare Annual Wellness Visit  02/09/2024   DTaP/Tdap/Td vaccine (2 - Td or Tdap) 06/22/2024   DEXA scan (bone density measurement)  01/19/2025   Colon Cancer Screening  11/07/2026   Pneumonia Vaccine  Completed   Hepatitis C Screening: USPSTF Recommendation to screen - Ages 6-79 yo.  Completed   HPV Vaccine  Aged Out   COVID-19 Vaccine  Discontinued  *Topic was postponed. The date shown is not the original due date.    Advanced directives: Advance directive discussed with you today. Even though you declined this today please call our office should you change your mind and we can give you the proper paperwork for you to fill out.  Conditions/risks identified: lose weight   Next appointment: Follow up in one year for your annual wellness visit    Preventive Care 65 Years and Older, Female Preventive care refers to lifestyle choices and visits with your health care provider that can promote health and wellness. What does preventive care include? A yearly physical exam. This is also called an annual well check. Dental exams once or twice a year. Routine eye exams.  Ask your health care provider how often you should have your eyes checked. Personal lifestyle choices, including: Daily care of your teeth and gums. Regular physical activity. Eating a healthy diet. Avoiding tobacco and drug use. Limiting alcohol use. Practicing safe sex. Taking low-dose aspirin every day. Taking vitamin and mineral supplements as recommended by your health care provider. What happens during an annual well check? The services and screenings done by your health care provider during your annual well check will depend on your age, overall health, lifestyle risk factors, and family history of disease. Counseling  Your health care provider may ask you questions about your: Alcohol use. Tobacco use. Drug use. Emotional well-being. Home and relationship well-being. Sexual activity. Eating habits. History of falls. Memory and ability to understand (cognition). Work and work Astronomer. Reproductive health. Screening  You may have the following tests or measurements: Height, weight, and BMI. Blood pressure. Lipid and cholesterol levels. These may be checked every 5 years, or more frequently if you are over 35 years old. Skin check. Lung cancer screening. You may have this screening every year starting at age 35 if you have a 30-pack-year history of smoking and currently smoke or have quit within the past 15 years. Fecal occult blood test (FOBT) of the stool. You may  have this test every year starting at age 40. Flexible sigmoidoscopy or colonoscopy. You may have a sigmoidoscopy every 5 years or a colonoscopy every 10 years starting at age 28. Hepatitis C blood test. Hepatitis B blood test. Sexually transmitted disease (STD) testing. Diabetes screening. This is done by checking your blood sugar (glucose) after you have not eaten for a while (fasting). You may have this done every 1-3 years. Bone density scan. This is done to screen for osteoporosis. You may have this  done starting at age 81. Mammogram. This may be done every 1-2 years. Talk to your health care provider about how often you should have regular mammograms. Talk with your health care provider about your test results, treatment options, and if necessary, the need for more tests. Vaccines  Your health care provider may recommend certain vaccines, such as: Influenza vaccine. This is recommended every year. Tetanus, diphtheria, and acellular pertussis (Tdap, Td) vaccine. You may need a Td booster every 10 years. Zoster vaccine. You may need this after age 50. Pneumococcal 13-valent conjugate (PCV13) vaccine. One dose is recommended after age 41. Pneumococcal polysaccharide (PPSV23) vaccine. One dose is recommended after age 21. Talk to your health care provider about which screenings and vaccines you need and how often you need them. This information is not intended to replace advice given to you by your health care provider. Make sure you discuss any questions you have with your health care provider. Document Released: 10/05/2015 Document Revised: 05/28/2016 Document Reviewed: 07/10/2015 Elsevier Interactive Patient Education  2017 [employer].  Fall Prevention in the Home Falls can cause injuries. They can happen to people of all ages. There are many things you can do to make your home safe and to help prevent falls. What can I do on the outside of my home? Regularly fix the edges of walkways and driveways and fix any cracks. Remove anything that might make you trip as you walk through a door, such as a raised step or threshold. Trim any bushes or trees on the path to your home. Use bright outdoor lighting. Clear any walking paths of anything that might make someone trip, such as rocks or tools. Regularly check to see if handrails are loose or broken. Make sure that both sides of any steps have handrails. Any raised decks and porches should have guardrails on the edges. Have any leaves,  snow, or ice cleared regularly. Use sand or salt on walking paths during winter. Clean up any spills in your garage right away. This includes oil or grease spills. What can I do in the bathroom? Use night lights. Install grab bars by the toilet and in the tub and shower. Do not use towel bars as grab bars. Use non-skid mats or decals in the tub or shower. If you need to sit down in the shower, use a plastic, non-slip stool. Keep the floor dry. Clean up any water that spills on the floor as soon as it happens. Remove soap buildup in the tub or shower regularly. Attach bath mats securely with double-sided non-slip rug tape. Do not have throw rugs and other things on the floor that can make you trip. What can I do in the bedroom? Use night lights. Make sure that you have a light by your bed that is easy to reach. Do not use any sheets or blankets that are too big for your bed. They should not hang down onto the floor. Have a firm chair that has side arms.  You can use this for support while you get dressed. Do not have throw rugs and other things on the floor that can make you trip. What can I do in the kitchen? Clean up any spills right away. Avoid walking on wet floors. Keep items that you use a lot in easy-to-reach places. If you need to reach something above you, use a strong step stool that has a grab bar. Keep electrical cords out of the way. Do not use floor polish or wax that makes floors slippery. If you must use wax, use non-skid floor wax. Do not have throw rugs and other things on the floor that can make you trip. What can I do with my stairs? Do not leave any items on the stairs. Make sure that there are handrails on both sides of the stairs and use them. Fix handrails that are broken or loose. Make sure that handrails are as long as the stairways. Check any carpeting to make sure that it is firmly attached to the stairs. Fix any carpet that is loose or worn. Avoid having throw  rugs at the top or bottom of the stairs. If you do have throw rugs, attach them to the floor with carpet tape. Make sure that you have a light switch at the top of the stairs and the bottom of the stairs. If you do not have them, ask someone to add them for you. What else can I do to help prevent falls? Wear shoes that: Do not have high heels. Have rubber bottoms. Are comfortable and fit you well. Are closed at the toe. Do not wear sandals. If you use a stepladder: Make sure that it is fully opened. Do not climb a closed stepladder. Make sure that both sides of the stepladder are locked into place. Ask someone to hold it for you, if possible. Clearly mark and make sure that you can see: Any grab bars or handrails. First and last steps. Where the edge of each step is. Use tools that help you move around (mobility aids) if they are needed. These include: Canes. Walkers. Scooters. Crutches. Turn on the lights when you go into a dark area. Replace any light bulbs as soon as they burn out. Set up your furniture so you have a clear path. Avoid moving your furniture around. If any of your floors are uneven, fix them. If there are any pets around you, be aware of where they are. Review your medicines with your doctor. Some medicines can make you feel dizzy. This can increase your chance of falling. Ask your doctor what other things that you can do to help prevent falls. This information is not intended to replace advice given to you by your health care provider. Make sure you discuss any questions you have with your health care provider. Document Released: 07/05/2009 Document Revised: 02/14/2016 Document Reviewed: 10/13/2014 Elsevier Interactive Patient Education  2017 [employer].

## 2023-02-09 NOTE — Telephone Encounter (Signed)
Returned pt call and advised per pharmacy they received prescription however this is available OTC. Pt states will check if available at [facility] and verbalized understanding.

## 2023-02-09 NOTE — Progress Notes (Signed)
I connected with  [name] [name] on 02/09/23 by a audio enabled telemedicine application and verified that I am speaking with the correct person using two identifiers.  Patient Location: Home  Provider Location: Office/Clinic  I discussed the limitations of evaluation and management by telemedicine. The patient expressed understanding and agreed to proceed.    Subjective:   [name] [name] is a 72 y.o. female who presents for Medicare Annual (Subsequent) preventive examination.  Review of Systems     Cardiac Risk Factors include: advanced age (>47men, >49 women);obesity (BMI >30kg/m2);diabetes mellitus;hypertension;dyslipidemia;smoking/ tobacco exposure     Objective:    Today's Vitals   02/09/23 1020  Weight: 293 lb (132.9 kg)   Body mass index is 48.76 kg/m.     02/09/2023   10:26 AM 01/02/2023    3:18 PM 01/27/2022   10:28 AM 01/21/2021   10:41 AM 11/21/2019    3:56 PM 02/15/2019    9:15 PM 02/15/2019    4:37 PM  Advanced Directives  Does Patient Have a Medical Advance Directive? No No No No No No No  Does patient want to make changes to medical advance directive?    No - Patient declined     Would patient like information on creating a medical advance directive? No - Patient declined Yes (ED - Information included in AVS) No - Patient declined  Yes (MAU/Ambulatory/Procedural Areas - Information given) No - Patient declined     Current Medications (verified) Outpatient Encounter Medications as of 02/09/2023  Medication Sig   Accu-Chek FastClix Lancets MISC USE TO TEST UP TO FOUR TIMES DAILY AS DIRECTED   ACCU-CHEK GUIDE test strip USE AS DIRECTED   albuterol (ACCUNEB) 1.25 MG/3ML nebulizer solution TAKE 3 MLS BY NEBULIZATION THREE TIMES DAILY AS NEEDED FOR WHEEZING   albuterol (VENTOLIN HFA) 108 (90 Base) MCG/ACT inhaler INHALE 2 PUFFS INTO THE LUNGS EVERY 6 HOURS AS NEEDED FOR WHEEZING OR SHORTNESS OF BREATH   ALPRAZolam (XANAX) 0.5 MG tablet TAKE 1 TABLET(0.5 MG) BY MOUTH TWICE  DAILY AS NEEDED FOR ANXIETY   aspirin 81 MG tablet Take 81 mg by mouth daily.   atorvastatin (LIPITOR) 10 MG tablet TAKE 1 TABLET(10 MG) BY MOUTH DAILY   blood glucose meter kit and supplies KIT Dispense based on patient and insurance preference. Use up to four times daily as directed. DX E11.8   budesonide-formoterol (SYMBICORT) 80-4.5 MCG/ACT inhaler Inhale 2 puffs into the lungs 2 (two) times daily.   Calcium Carb-Cholecalciferol (CALCIUM 500 + D3 PO) Take by mouth.   canagliflozin (INVOKANA) 300 MG TABS tablet TAKE 1 TABLET(300 MG) BY MOUTH DAILY BEFORE BREAKFAST   furosemide (LASIX) 20 MG tablet Taking 3 times a week (Patient taking differently: Taking 1 times a week)   glipiZIDE (GLUCOTROL XL) 2.5 MG 24 hr tablet Take 1 tablet (2.5 mg total) by mouth daily with breakfast.   ipratropium (ATROVENT) 0.06 % nasal spray USE 2 SPRAYS IN EACH NOSTRIL FOUR TIMES DAILY AS DIRECTED   ipratropium-albuterol (DUONEB) 0.5-2.5 (3) MG/3ML SOLN Take 3 mLs by nebulization every 6 (six) hours as needed.   isosorbide mononitrate (IMDUR) 60 MG 24 hr tablet TAKE 1 TABLET(60 MG) BY MOUTH DAILY   levothyroxine (SYNTHROID) 100 MCG tablet TAKE 1 TABLET(100 MCG) BY MOUTH DAILY   meloxicam (MOBIC) 7.5 MG tablet Take 1 tablet (7.5 mg total) by mouth daily.   Menthol, Topical Analgesic, (BIOFREEZE) 4 % GEL Apply topically.   pantoprazole (PROTONIX) 40 MG tablet TAKE 1 TABLET(40 MG)  BY MOUTH DAILY   TRADJENTA 5 MG TABS tablet TAKE 1 TABLET(5 MG) BY MOUTH DAILY   vitamin B-12 (CYANOCOBALAMIN) 1000 MCG tablet Take 1,000 mcg by mouth daily.   Olopatadine HCl 0.2 % SOLN Apply 1 drop to eye 2 (two) times daily. (Patient not taking: Reported on 02/09/2023)   No facility-administered encounter medications on file as of 02/09/2023.    Allergies (verified) Clindamycin/lincomycin, Codeine, Cortisone, Dilaudid [hydromorphone hcl], Iodides, Reglan [metoclopramide], Tramadol, and Tape   History: Past Medical History:  Diagnosis  Date   Bronchitis, asthmatic    Coronary artery disease    BMS to LAD 02/2008   Diabetes mellitus without complication (HCC)    Hyperlipidemia    Hypertensive heart disease 04/16/2016   Hypothyroidism    MI, old    Morbid obesity (HCC) 04/16/2016   S/P coronary artery stent placement 04/16/2016   BMS to LAD 2009   Tobacco abuse 04/16/2016   Past Surgical History:  Procedure Laterality Date   ABDOMINAL HYSTERECTOMY     35; very heavy bleeding   ANKLE SURGERY Right    CHOLECYSTECTOMY     CORONARY ANGIOPLASTY  02/2008   bare metal stent to LAD    CORONARY STENT PLACEMENT     KNEE ARTHROSCOPY     OOPHORECTOMY Left    age 77   ROTATOR CUFF REPAIR Bilateral    THORACIC OUTLET SURGERY     WRIST SURGERY     Family History  Problem Relation Age of Onset   Cancer Mother    Diabetes Son    Hypertension Son    Social History   Socioeconomic History   Marital status: Married    Spouse name: Not on file   Number of children: Not on file   Years of education: Not on file   Highest education level: Not on file  Occupational History   Occupation: Disabled   Tobacco Use   Smoking status: Light Smoker    Packs/day: 0.25    Years: 31.00    Additional pack years: 0.00    Total pack years: 7.75    Types: Cigarettes   Smokeless tobacco: Never  Vaping Use   Vaping Use: Never used  Substance and Sexual Activity   Alcohol use: No   Drug use: No   Sexual activity: Yes    Partners: Male  Other Topics Concern   Not on file  Social History Narrative   Originally from [place]     Still drives    Social Determinants of Health   Financial Resource Strain: Low Risk  (02/09/2023)   Overall Financial Resource Strain (CARDIA)    Difficulty of Paying Living Expenses: Not hard at all  Food Insecurity: No Food Insecurity (02/09/2023)   Hunger Vital Sign    Worried About Running Out of Food in the Last Year: Never true    Ran Out of Food in the Last Year: Never true  Transportation  Needs: No Transportation Needs (02/09/2023)   PRAPARE - Administrator, Civil Service (Medical): No    Lack of Transportation (Non-Medical): No  Physical Activity: Inactive (02/09/2023)   Exercise Vital Sign    Days of Exercise per Week: 0 days    Minutes of Exercise per Session: 0 min  Stress: No Stress Concern Present (02/09/2023)   [employer] of Occupational Health - Occupational Stress Questionnaire    Feeling of Stress : Not at all  Social Connections: Moderately Isolated (02/09/2023)   Social Connection and  Isolation Panel [NHANES]    Frequency of Communication with Friends and Family: More than three times a week    Frequency of Social Gatherings with Friends and Family: More than three times a week    Attends Religious Services: Never    Database administrator or Organizations: No    Attends Engineer, structural: Never    Marital Status: Married    Tobacco Counseling Ready to quit: Not Answered Counseling given: Not Answered   Clinical Intake:  Pre-visit preparation completed: Yes  Pain : No/denies pain     BMI - recorded: 48.76 Nutritional Status: BMI > 30  Obese Nutritional Risks: None Diabetes: Yes CBG done?: Yes (140 per pt) CBG resulted in Enter/ Edit results?: No Did pt. bring in CBG monitor from home?: No  How often do you need to have someone help you when you read instructions, pamphlets, or other written materials from your doctor or pharmacy?: 1 - Never  Diabetic?Nutrition Risk Assessment:  Has the patient had any N/V/D within the last 2 months?  No  Does the patient have any non-healing wounds?  No  Has the patient had any unintentional weight loss or weight gain?  No   Diabetes:  Is the patient diabetic?  Yes  If diabetic, was a CBG obtained today?  Yes  Did the patient bring in their glucometer from home?  No  How often do you monitor your CBG's? daily.   Financial Strains and Diabetes Management:  Are you having  any financial strains with the device, your supplies or your medication? No .  Does the patient want to be seen by Chronic Care Management for management of their diabetes?  No  Would the patient like to be referred to a Nutritionist or for Diabetic Management?  No   Diabetic Exams:  Diabetic Eye Exam: Overdue for diabetic eye exam. Pt has been advised about the importance in completing this exam. Patient advised to call and schedule an eye exam. Diabetic Foot Exam: Overdue, Pt has been advised about the importance in completing this exam. Pt is scheduled for diabetic foot exam on next appt .   Interpreter Needed?: No  Information entered by :: Lanier Ensign, LPN   Activities of Daily Living    02/09/2023   10:27 AM  In your present state of health, do you have any difficulty performing the following activities:  Hearing? 0  Vision? 0  Difficulty concentrating or making decisions? 0  Walking or climbing stairs? 1  Comment get out of breath uses a lift but will climb once a day  Dressing or bathing? 0  Doing errands, shopping? 0  Preparing Food and eating ? Y  Comment husband prepares  Using the Toilet? N  In the past six months, have you accidently leaked urine? Y  Comment at times  Do you have problems with loss of bowel control? N  Managing your Medications? N  Managing your Finances? N  Housekeeping or managing your Housekeeping? N    Patient Care Team: Allwardt, Crist Infante, PA-C as PCP - General (Physician Assistant) Chilton Si, MD as PCP - Cardiology (Cardiology) Daisy Blossom, MD (Inactive) as Consulting Physician (Hematology and Oncology) Lunette Stands, MD as Consulting Physician (Orthopedic Surgery) Melina Fiddler, MD as Consulting Physician (Sports Medicine)  Indicate any recent Medical Services you may have received from other than [facility] providers in the past year (date may be approximate).     Assessment:   This is  a routine wellness examination  for Ivelis.  Hearing/Vision screen Hearing Screening - Comments:: Pt denies any hearing issues  Vision Screening - Comments:: Pt follows up with [facility] for annual eye exams   Dietary issues and exercise activities discussed: Current Exercise Habits: The patient does not participate in regular exercise at present   Goals Addressed             This Visit's Progress    Patient Stated       Lose weight        Depression Screen    02/09/2023   10:24 AM 02/03/2023   11:12 AM 01/27/2022   10:26 AM 01/21/2021   10:33 AM 08/10/2020    1:42 PM 07/02/2020    2:25 PM 11/21/2019    3:58 PM  PHQ 2/9 Scores  PHQ - 2 Score 0 1 0 0 0 0 0    Fall Risk    02/09/2023   10:27 AM 02/03/2023   11:11 AM 06/12/2022   11:07 AM 01/27/2022   10:31 AM 12/31/2021   12:03 PM  Fall Risk   Falls in the past year? 0 0 0 0 0  Number falls in past yr: 0 0 0 0 0  Injury with Fall? 0 0 0 0 0  Risk for fall due to : Impaired vision No Fall Risks No Fall Risks Impaired vision;Impaired balance/gait;Impaired mobility No Fall Risks  Follow up Falls prevention discussed Falls evaluation completed Falls evaluation completed Falls prevention discussed Falls evaluation completed    FALL RISK PREVENTION PERTAINING TO THE HOME:  Any stairs in or around the home? Yes  If so, are there any without handrails? No  Home free of loose throw rugs in walkways, pet beds, electrical cords, etc? Yes  Adequate lighting in your home to reduce risk of falls? Yes   ASSISTIVE DEVICES UTILIZED TO PREVENT FALLS:  Life alert? No  Use of a cane, walker or w/c? Yes  Grab bars in the bathroom? No  Shower chair or bench in shower? No  Elevated toilet seat or a handicapped toilet? No   TIMED UP AND GO:  Was the test performed? No .   Cognitive Function:        02/09/2023   10:28 AM 01/27/2022   10:35 AM 01/21/2021   10:49 AM 11/21/2019    3:57 PM  6CIT Screen  What Year? 0 points 0 points 0 points 0 points  What month? 0  points 0 points 0 points 0 points  What time? 0 points 0 points  0 points  Count back from 20 0 points 0 points 0 points 0 points  Months in reverse 0 points 0 points 0 points 0 points  Repeat phrase 0 points 2 points 0 points 0 points  Total Score 0 points 2 points  0 points    Immunizations Immunization History  Administered Date(s) Administered   Fluad Quad(high Dose 65+) 06/01/2019, 07/30/2020, 07/16/2021, 06/12/2022   Influenza, High Dose Seasonal PF 07/19/2018   Influenza,inj,Quad PF,6+ Mos 07/24/2017   Moderna Sars-Covid-2 Vaccination 02/02/2020, 03/05/2020   PNEUMOCOCCAL CONJUGATE-20 02/03/2023   Pneumococcal Polysaccharide-23 12/19/2019   Tdap 06/22/2014    TDAP status: Up to date  Flu Vaccine status: Up to date  Pneumococcal vaccine status: Up to date  Covid-19 vaccine status: Completed vaccines  Qualifies for Shingles Vaccine? Yes   Zostavax completed No   Shingrix Completed?: No.    Education has been provided regarding the importance of this  vaccine. Patient has been advised to call insurance company to determine out of pocket expense if they have not yet received this vaccine. Advised may also receive vaccine at local pharmacy or Health Dept. Verbalized acceptance and understanding.  Screening Tests Health Maintenance  Topic Date Due   FOOT EXAM  11/08/2021   Diabetic kidney evaluation - Urine ACR  05/20/2022   OPHTHALMOLOGY EXAM  11/21/2022   Zoster Vaccines- Shingrix (1 of 2) 04/03/2023 (Originally 06/17/2003)   INFLUENZA VACCINE  04/23/2023   HEMOGLOBIN A1C  07/04/2023   Diabetic kidney evaluation - eGFR measurement  01/02/2024   MAMMOGRAM  01/02/2024   Medicare Annual Wellness (AWV)  02/09/2024   DTaP/Tdap/Td (2 - Td or Tdap) 06/22/2024   DEXA SCAN  01/19/2025   COLONOSCOPY (Pts 45-24yrs Insurance coverage will need to be confirmed)  11/07/2026   Pneumonia Vaccine 74+ Years old  Completed   Hepatitis C Screening  Completed   HPV VACCINES  Aged Out    COVID-19 Vaccine  Discontinued    Health Maintenance  Health Maintenance Due  Topic Date Due   FOOT EXAM  11/08/2021   Diabetic kidney evaluation - Urine ACR  05/20/2022   OPHTHALMOLOGY EXAM  11/21/2022    Colorectal cancer screening: Type of screening: Colonoscopy. Completed 11/07/16. Repeat every 10 years  Mammogram status: Completed 01/01/22. Repeat every year  Bone Density status: Completed 01/20/20. Results reflect: Bone density results: OSTEOPENIA. Repeat every 2 years.   Additional Screening:  Hepatitis C Screening:  Completed 01/28/17  Vision Screening: Recommended annual ophthalmology exams for early detection of glaucoma and other disorders of the eye. Is the patient up to date with their annual eye exam?  No  Who is the provider or what is the name of the office in which the patient attends annual eye exams? [facility]  If pt is not established with a provider, would they like to be referred to a provider to establish care? No .   Dental Screening: Recommended annual dental exams for proper oral hygiene  Community Resource Referral / Chronic Care Management: CRR required this visit?  No   CCM required this visit?  No      Plan:     I have personally reviewed and noted the following in the patient's chart:   Medical and social history Use of alcohol, tobacco or illicit drugs  Current medications and supplements including opioid prescriptions. Patient is not currently taking opioid prescriptions. Functional ability and status Nutritional status Physical activity Advanced directives List of other physicians Hospitalizations, surgeries, and ER visits in previous 12 months Vitals Screenings to include cognitive, depression, and falls Referrals and appointments  In addition, I have reviewed and discussed with patient certain preventive protocols, quality metrics, and best practice recommendations. A written personalized care plan for preventive services as well  as general preventive health recommendations were provided to patient.     Marzella Schlein, LPN   1/61/0960   Nurse Notes: none

## 2023-02-09 NOTE — Telephone Encounter (Signed)
Pt states the pharmacy stated they do not have the RX  Olopatadine HCl 0.2 % SOLN  Please resend

## 2023-02-11 ENCOUNTER — Ambulatory Visit (INDEPENDENT_AMBULATORY_CARE_PROVIDER_SITE_OTHER): Payer: Medicare HMO | Admitting: *Deleted

## 2023-02-11 DIAGNOSIS — J449 Chronic obstructive pulmonary disease, unspecified: Secondary | ICD-10-CM

## 2023-02-11 DIAGNOSIS — E1165 Type 2 diabetes mellitus with hyperglycemia: Secondary | ICD-10-CM

## 2023-02-11 NOTE — Chronic Care Management (AMB) (Signed)
Chronic Care Management   CCM RN Visit Note  02/11/2023 Name: [name] [name] MRN: [id number] DOB: [birth year]  Subjective: [name] [name] is a 72 y.o. year old female who is a primary care patient of Allwardt, Alyssa M, PA-C. The patient was referred to the Chronic Care Management team for assistance with care management needs subsequent to provider initiation of CCM services and plan of care.    Today's Visit:  Engaged with patient by telephone for initial visit.     SDOH Interventions Today    Flowsheet Row Most Recent Value  SDOH Interventions   Food Insecurity Interventions Intervention Not Indicated  Housing Interventions Intervention Not Indicated  Transportation Interventions Intervention Not Indicated  Financial Strain Interventions Intervention Not Indicated  Physical Activity Interventions Patient Declined, [facility]  [pt states that she will be swimming alot this summer]  Stress Interventions Intervention Not Indicated  Social Connections Interventions Patient Declined         Goals Addressed             This Visit's Progress    CCM (COPD) EXPECTED OUTCOME: MONITOR, SELF-MANAGE AND REDUCE SYMPTOMS OF COPD       Current Barriers:  Knowledge Deficits related to COPD management Care Coordination needs related to pharmacy needs/ medication management in a patient with COPD Chronic Disease Management support and education needs related to COPD No Advanced Directives in place- pt declines information Patient reports she lives with spouse, daughter and son in , is independent with all aspects of her care, reports she has all medications including inhalers and taking as presribed Patient reports she smokes approximately 1/2 ppd cigarettes, not interested in smoking cessation at present  Planned Interventions: Provided patient with basic written and verbal COPD education on self care/management/and exacerbation prevention Provided written and verbal  instructions on pursed lip breathing and utilized returned demonstration as teach back Provided instruction about proper use of medications used for management of COPD including inhalers Advised patient to self assesses COPD action plan zone and make appointment with provider if in the yellow zone for 48 hours without improvement Advised patient to engage in light exercise as tolerated 3-5 days a week to aid in the the management of COPD Provided education about and advised patient to utilize infection prevention strategies to reduce risk of respiratory infection Screening for signs and symptoms of depression related to chronic disease state  Assessed social determinant of health barriers  Symptom Management: Take medications as prescribed   Attend all scheduled provider appointments Call pharmacy for medication refills 3-7 days in advance of running out of medications Attend church or other social activities Perform all self care activities independently  Perform IADL's (shopping, preparing meals, housekeeping, managing finances) independently Call provider office for new concerns or questions  eliminate smoking in my home identify and remove indoor air pollutants listen for public air quality announcements every day do breathing exercises every day develop a rescue plan follow rescue plan if symptoms flare-up eat healthy/prescribed diet: heart healthy, carbohydrate modified get at least 7 to 8 hours of sleep at night do breathing exercises every day Look over education mailed- COPD action plan  Follow Up Plan: Telephone follow up appointment with care management team member scheduled for:  04/21/23 at 3 pm       CCM (DIABETES) EXPECTED OUTCOME: MONITOR, SELF-MANAGE AND REDUCE SYMPTOMS OF DIABETES       Current Barriers:  Knowledge Deficits related to Diabetes management Chronic Disease Management support  and education needs related to Diabetes, diet Patient reports she checks CBG  TID, fasting readings in low 100's with today's reading 118, random readings around 140 Patient reports she tries to follow a special diet but loves to eat potato chips  Planned Interventions: Provided education to patient about basic DM disease process; Reviewed medications with patient and discussed importance of medication adherence;        Reviewed prescribed diet with patient carbohydrate modified; Counseled on importance of regular laboratory monitoring as prescribed;        Discussed plans with patient for ongoing care management follow up and provided patient with direct contact information for care management team;      Provided patient with written educational materials related to hypo and hyperglycemia and importance of correct treatment;       Advised patient, providing education and rationale, to check cbg three times daily and record        call provider for findings outside established parameters;       Review of patient status, including review of consultants reports, relevant laboratory and other test results, and medications completed;       Screening for signs and symptoms of depression related to chronic disease state;        Assessed social determinant of health barriers;         Symptom Management: Take medications as prescribed   Attend all scheduled provider appointments Call pharmacy for medication refills 3-7 days in advance of running out of medications Attend church or other social activities Perform all self care activities independently  Perform IADL's (shopping, preparing meals, housekeeping, managing finances) independently Call provider office for new concerns or questions  check blood sugar at prescribed times: three times daily check feet daily for cuts, sores or redness enter blood sugar readings and medication or insulin into daily log take the blood sugar log to all doctor visits take the blood sugar meter to all doctor visits set goal weight trim  toenails straight across fill half of plate with vegetables limit fast food meals to no more than 1 per week manage portion size prepare main meal at home 3 to 5 days each week read food labels for fat, fiber, carbohydrates and portion size set a realistic goal keep feet up while sitting wash and dry feet carefully every day wear comfortable, cotton socks wear comfortable, well-fitting shoes Try to limit/ avoid concentrated sweets and processed foods  Look over education mailed- hypoglycemia  Follow Up Plan: Telephone follow up appointment with care management team member scheduled for:  04/21/23 at 3 pm          Plan:Telephone follow up appointment with care management team member scheduled for:  04/21/23 at 3 pm  Irving Shows [facility], BSN RN Case Physicist, medical Healthcare at [facility] [phone]

## 2023-02-11 NOTE — Patient Instructions (Signed)
Please call the care guide team at [phone] if you need to cancel or reschedule your appointment.   If you are experiencing a Mental Health or Behavioral Health Crisis or need someone to talk to, please call the Suicide and Crisis Lifeline: 988 call the [place] National Suicide Prevention Lifeline: 340-151-2440 or TTY: 989-082-2130 TTY 445-090-8107) to talk to a trained counselor call 1-800-273-TALK (toll free, 24 hour hotline) go to [facility] [address] [place] [phone]) call 911   Following is a copy of the CCM Program Consent:  CCM service includes personalized support from designated clinical staff supervised by the physician, including individualized plan of care and coordination with other care providers 24/7 contact phone numbers for assistance for urgent and routine care needs. Service will only be billed when office clinical staff spend 20 minutes or more in a month to coordinate care. Only one practitioner may furnish and bill the service in a calendar month. The patient may stop CCM services at amy time (effective at the end of the month) by phone call to the office staff. The patient will be responsible for cost sharing (co-pay) or up to 20% of the service fee (after annual deductible is met)  Following is a copy of your full provider care plan:   Goals Addressed             This Visit's Progress    CCM (COPD) EXPECTED OUTCOME: MONITOR, SELF-MANAGE AND REDUCE SYMPTOMS OF COPD       Current Barriers:  Knowledge Deficits related to COPD management Care Coordination needs related to pharmacy needs/ medication management in a patient with COPD Chronic Disease Management support and education needs related to COPD No Advanced Directives in place- pt declines information Patient reports she lives with spouse, daughter and son in , is independent with all aspects of her care, reports she has all medications including inhalers  and taking as presribed Patient reports she smokes approximately 1/2 ppd cigarettes, not interested in smoking cessation at present  Planned Interventions: Provided patient with basic written and verbal COPD education on self care/management/and exacerbation prevention Provided written and verbal instructions on pursed lip breathing and utilized returned demonstration as teach back Provided instruction about proper use of medications used for management of COPD including inhalers Advised patient to self assesses COPD action plan zone and make appointment with provider if in the yellow zone for 48 hours without improvement Advised patient to engage in light exercise as tolerated 3-5 days a week to aid in the the management of COPD Provided education about and advised patient to utilize infection prevention strategies to reduce risk of respiratory infection Screening for signs and symptoms of depression related to chronic disease state  Assessed social determinant of health barriers  Symptom Management: Take medications as prescribed   Attend all scheduled provider appointments Call pharmacy for medication refills 3-7 days in advance of running out of medications Attend church or other social activities Perform all self care activities independently  Perform IADL's (shopping, preparing meals, housekeeping, managing finances) independently Call provider office for new concerns or questions  eliminate smoking in my home identify and remove indoor air pollutants listen for public air quality announcements every day do breathing exercises every day develop a rescue plan follow rescue plan if symptoms flare-up eat healthy/prescribed diet: heart healthy, carbohydrate modified get at least 7 to 8 hours of sleep at night do breathing exercises every day Look over education mailed- COPD action plan  Follow Up Plan: Telephone follow up appointment with care management team member scheduled for:   04/21/23 at 3 pm       CCM (DIABETES) EXPECTED OUTCOME: MONITOR, SELF-MANAGE AND REDUCE SYMPTOMS OF DIABETES       Current Barriers:  Knowledge Deficits related to Diabetes management Chronic Disease Management support and education needs related to Diabetes, diet Patient reports she checks CBG TID, fasting readings in low 100's with today's reading 118, random readings around 140 Patient reports she tries to follow a special diet but loves to eat potato chips  Planned Interventions: Provided education to patient about basic DM disease process; Reviewed medications with patient and discussed importance of medication adherence;        Reviewed prescribed diet with patient carbohydrate modified; Counseled on importance of regular laboratory monitoring as prescribed;        Discussed plans with patient for ongoing care management follow up and provided patient with direct contact information for care management team;      Provided patient with written educational materials related to hypo and hyperglycemia and importance of correct treatment;       Advised patient, providing education and rationale, to check cbg three times daily and record        call provider for findings outside established parameters;       Review of patient status, including review of consultants reports, relevant laboratory and other test results, and medications completed;       Screening for signs and symptoms of depression related to chronic disease state;        Assessed social determinant of health barriers;         Symptom Management: Take medications as prescribed   Attend all scheduled provider appointments Call pharmacy for medication refills 3-7 days in advance of running out of medications Attend church or other social activities Perform all self care activities independently  Perform IADL's (shopping, preparing meals, housekeeping, managing finances) independently Call provider office for new concerns or  questions  check blood sugar at prescribed times: three times daily check feet daily for cuts, sores or redness enter blood sugar readings and medication or insulin into daily log take the blood sugar log to all doctor visits take the blood sugar meter to all doctor visits set goal weight trim toenails straight across fill half of plate with vegetables limit fast food meals to no more than 1 per week manage portion size prepare main meal at home 3 to 5 days each week read food labels for fat, fiber, carbohydrates and portion size set a realistic goal keep feet up while sitting wash and dry feet carefully every day wear comfortable, cotton socks wear comfortable, well-fitting shoes Try to limit/ avoid concentrated sweets and processed foods  Look over education mailed- hypoglycemia  Follow Up Plan: Telephone follow up appointment with care management team member scheduled for:  04/21/23 at 3 pm          The patient verbalized understanding of instructions, educational materials, and care plan provided today and agreed to receive a mailed copy of patient instructions, educational materials, and care plan.  Telephone follow up appointment with care management team member scheduled for:  04/21/23 at 3 pm  Hypoglycemia Hypoglycemia is when the sugar (glucose) level in your blood is too low. Low blood sugar can happen to people who have diabetes and people who do not have diabetes. Low blood sugar can happen quickly, and it can be an emergency. What are  the causes? This condition happens most often in people who have diabetes. It may be caused by: Diabetes medicine. Not eating enough, or not eating often enough. Doing more physical activity. Drinking alcohol on an empty stomach. If you do not have diabetes, this condition may be caused by: A tumor in the pancreas. Not eating enough, or not eating for long periods at a time (fasting). A very bad infection or illness. Problems after  having weight loss (bariatric) surgery. Kidney failure or liver failure. Certain medicines. What increases the risk? This condition is more likely to develop in people who: Have diabetes and take medicines to lower their blood sugar. Abuse alcohol. Have a very bad illness. What are the signs or symptoms? Mild Hunger. Sweating and feeling clammy. Feeling dizzy or light-headed. Being sleepy or having trouble sleeping. Feeling like you may vomit (nauseous). A fast heartbeat. A headache. Blurry vision. Mood changes, such as: Being grouchy. Feeling worried or nervous (anxious). Tingling or loss of feeling (numbness) around your mouth, lips, or tongue. Moderate Confusion and poor judgment. Behavior changes. Weakness. Uneven heartbeat. Trouble with moving (coordination). Very low Very low blood sugar (severe hypoglycemia) is a medical emergency. It can cause: Fainting. Seizures. Loss of consciousness (coma). Death. How is this treated? Treating low blood sugar Low blood sugar is often treated by eating or drinking something that has sugar in it right away. The food or drink should contain 15 grams of a fast-acting carb (carbohydrate). Options include: 4 oz (120 mL) of fruit juice. 4 oz (120 mL) of regular soda (not diet soda). A few pieces of hard candy. Check food labels to see how many pieces to eat for 15 grams. 1 Tbsp (15 mL) of sugar or honey. 4 glucose tablets. 1 tube of glucose gel. Treating low blood sugar if you have diabetes If you can think clearly and swallow safely, follow the 15:15 rule: Take 15 grams of a fast-acting carb. Talk with your doctor about how much you should take. Always keep a source of fast-acting carb with you, such as: Glucose tablets (take 4 tablets). A few pieces of hard candy. Check food labels to see how many pieces to eat for 15 grams. 4 oz (120 mL) of fruit juice. 4 oz (120 mL) of regular soda (not diet soda). 1 Tbsp (15 mL) of honey or  sugar. 1 tube of glucose gel. Check your blood sugar 15 minutes after you take the carb. If your blood sugar is still at or below 70 mg/dL (3.9 mmol/L), take 15 grams of a carb again. If your blood sugar does not go above 70 mg/dL (3.9 mmol/L) after 3 tries, get help right away. After your blood sugar goes back to normal, eat a meal or a snack within 1 hour.  Treating very low blood sugar If your blood sugar is below 54 mg/dL (3 mmol/L), you have very low blood sugar, or severe hypoglycemia. This is an emergency. Get medical help right away. If you have very low blood sugar and you cannot eat or drink, you will need to be given a hormone called glucagon. A family member or friend should learn how to check your blood sugar and how to give you glucagon. Ask your doctor if you need to have an emergency glucagon kit at home. Very low blood sugar may also need to be treated in a hospital. Follow these instructions at home: General instructions Take over-the-counter and prescription medicines only as told by your doctor. Stay aware of  your blood sugar as told by your doctor. If you drink alcohol: Limit how much you have to: 0-1 drink a day for women who are not pregnant. 0-2 drinks a day for men. Know how much alcohol is in your drink. In the [place] one drink equals one 12 oz bottle of beer (355 mL), one 5 oz glass of wine (148 mL), or one 1 oz glass of hard liquor (44 mL). Be sure to eat food when you drink alcohol. Know that your body absorbs alcohol quickly. This may lead to low blood sugar later. Be sure to keep checking your blood sugar. Keep all follow-up visits. If you have diabetes:  Always have a fast-acting carb (15 grams) with you to treat low blood sugar. Follow your diabetes care plan as told by your doctor. Make sure you: Know the symptoms of low blood sugar. Check your blood sugar as often as told. Always check it before and after exercise. Always check your blood sugar before  you drive. Take your medicines as told. Follow your meal plan. Eat on time. Do not skip meals. Share your diabetes care plan with: Your work or school. People you live with. Carry a card or wear jewelry that says you have diabetes. Where to find more information American Diabetes Association: www.diabetes.org Contact a doctor if: You have trouble keeping your blood sugar in your target range. You have low blood sugar often. Get help right away if: You still have symptoms after you eat or drink something that contains 15 grams of fast-acting carb, and you cannot get your blood sugar above 70 mg/dL by following the 16:10 rule. Your blood sugar is below 54 mg/dL (3 mmol/L). You have a seizure. You faint. These symptoms may be an emergency. Get help right away. Call your local emergency services (911 in the [place] Do not wait to see if the symptoms will go away. Do not drive yourself to the hospital. Summary Hypoglycemia happens when the level of sugar (glucose) in your blood is too low. Low blood sugar can happen to people who have diabetes and people who do not have diabetes. Low blood sugar can happen quickly, and it can be an emergency. Make sure you know the symptoms of low blood sugar and know how to treat it. Always keep a source of sugar (fast-acting carb) with you to treat low blood sugar. This information is not intended to replace advice given to you by your health care provider. Make sure you discuss any questions you have with your health care provider. Document Revised: 08/09/2020 Document Reviewed: 08/09/2020 Elsevier Patient Education  2023 [employer] COPD Action Plan A COPD action plan is a description of what to do when you have a flare (exacerbation) of chronic obstructive pulmonary disease (COPD). Your action plan is a color-coded plan that lists the symptoms that indicate whether your condition is under control and what actions to take. If you have symptoms in the  green zone, it means you are doing well that day. If you have symptoms in the yellow zone, it means you are having a bad day or an exacerbation. If you have symptoms in the red zone, you need urgent medical care. Follow the plan that you and your health care provider developed. Review your plan with your health care provider at each visit. Red zone Symptoms in this zone mean that you should get medical help right away. They include: Feeling very short of breath, even when you are resting. Not being  able to do any activities because of poor breathing. Not being able to sleep because of poor breathing. Fever or shaking chills. Feeling confused or very sleepy. Chest pain. Coughing up blood. If you have any of these symptoms, call emergency services (911 in the [place] or go to the nearest emergency room. Yellow zone Symptoms in this zone mean that your condition may be getting worse. They include: Feeling more short of breath than usual. Having less energy for daily activities than usual. Phlegm or mucus that is thicker than usual. Needing to use your rescue inhaler or nebulizer more often than usual. More ankle swelling than usual. Coughing more than usual. Feeling like you have a chest cold. Trouble sleeping due to COPD symptoms. Decreased appetite. COPD medicines not helping as much as usual. If you experience any "yellow" symptoms: Keep taking your daily medicines as directed. Use your quick-relief inhaler as told by your health care provider. If you were prescribed steroid medicine to take by mouth (oral medicine), start taking it as told by your health care provider. If you were prescribed an antibiotic medicine, start taking it as told by your health care provider. Do not stop taking the antibiotic even if you start to feel better. Use oxygen as told by your health care provider. Get more rest. Do your pursed-lip breathing exercises. Do not smoke. Avoid any irritants in the  air. If your signs and symptoms do not improve after taking these steps, call your health care provider right away. Green zone Symptoms in this zone mean that you are doing well. They include: Being able to do your usual activities and exercise. Having the usual amount of coughing, including the same amount of phlegm or mucus. Being able to sleep well. Having a good appetite. Where to find more information: You can find more information about COPD from: American Lung Association, My COPD Action Plan: www.lung.org COPD Foundation: www.copdfoundation.org National Heart, Lung, & Blood Institute: [employer] Follow these instructions at home: Continue taking your daily medicines as told by your health care provider. Make sure you receive all the immunizations that your health care provider recommends, especially the pneumococcal and influenza vaccines. Wash your hands often with soap and water. Have family members wash their hands too. Regular hand washing can help prevent infections. Follow your usual exercise and diet plan. Avoid irritants in the air, such as smoke. Do not use any products that contain nicotine or tobacco. These products include cigarettes, chewing tobacco, and vaping devices, such as e-cigarettes. If you need help quitting, ask your health care provider. Summary A COPD action plan tells you what to do when you have a flare (exacerbation) of chronic obstructive pulmonary disease (COPD). Follow each action plan for your symptoms. If you have any symptoms in the red zone, call emergency services (911 in the [place] or go to the nearest emergency room. This information is not intended to replace advice given to you by your health care provider. Make sure you discuss any questions you have with your health care provider. Document Revised: 07/17/2020 Document Reviewed: 07/17/2020 Elsevier Patient Education  2023 [employer].

## 2023-02-11 NOTE — Plan of Care (Signed)
Chronic Care Management Provider Comprehensive Care Plan    02/11/2023 Name: [name] [name] MRN: [id number] DOB: 09/14/1951  Referral to Chronic Care Management (CCM) services was placed by Provider:  Arlyss Repress Allwardt PA-C  on Date: 02/03/23.  Chronic Condition 1: COPD Provider Assessment and Plan COPD GOLD 0/ prob AB still smoking  -     AMB Referral to Chronic Care Management Services -     Ipratropium-Albuterol; Take 3 mLs by nebulization every 6 (six) hours as needed.  Dispense: 360 mL; Refill: 2   Expected Outcome/Goals Addressed This Visit (Provider CCM goals/Provider Assessment and plan  CCM (COPD) EXPECTED OUTCOME: MONITOR, SELF-MANAGE AND REDUCE SYMPTOMS OF COPD  Symptom Management Condition 1: Take medications as prescribed   Attend all scheduled provider appointments Call pharmacy for medication refills 3-7 days in advance of running out of medications Attend church or other social activities Perform all self care activities independently  Perform IADL's (shopping, preparing meals, housekeeping, managing finances) independently Call provider office for new concerns or questions  eliminate smoking in my home identify and remove indoor air pollutants listen for public air quality announcements every day do breathing exercises every day develop a rescue plan follow rescue plan if symptoms flare-up eat healthy/prescribed diet: heart healthy, carbohydrate modified get at least 7 to 8 hours of sleep at night do breathing exercises every day Look over education mailed- COPD action plan  Chronic Condition 2: DIABETES Provider Assessment and Plan Type 2 diabetes mellitus with hyperglycemia, without long-term current use of insulin (HCC) Assessment & Plan: Patient is currently taking Tradjenta 5 mg, Invokana 300 mg.  However, Tradjenta too expensive at this time. Plan to try Glipizide XL 2.5 mg daily in place of this. Cautioned hypoglycemia. She could not tolerate metformin nor the  GLP-1 injectables.  She may need to consider insulin at some point. A1c stable. Needs to work on diet still.   Expected Outcome/Goals Addressed This Visit (Provider CCM goals/Provider Assessment and plan  CCM (DIABETES) EXPECTED OUTCOME: MONITOR, SELF-MANAGE AND REDUCE SYMPTOMS OF DIABETES  Symptom Management Condition 2: Take medications as prescribed   Attend all scheduled provider appointments Call pharmacy for medication refills 3-7 days in advance of running out of medications Attend church or other social activities Perform all self care activities independently  Perform IADL's (shopping, preparing meals, housekeeping, managing finances) independently Call provider office for new concerns or questions  check blood sugar at prescribed times: three times daily check feet daily for cuts, sores or redness enter blood sugar readings and medication or insulin into daily log take the blood sugar log to all doctor visits take the blood sugar meter to all doctor visits set goal weight trim toenails straight across fill half of plate with vegetables limit fast food meals to no more than 1 per week manage portion size prepare main meal at home 3 to 5 days each week read food labels for fat, fiber, carbohydrates and portion size set a realistic goal keep feet up while sitting wash and dry feet carefully every day wear comfortable, cotton socks wear comfortable, well-fitting shoes Try to limit/ avoid concentrated sweets and processed foods  Look over education mailed- hypoglycemia  Problem List Patient Active Problem List   Diagnosis Date Noted   CKD (chronic kidney disease) stage 3, GFR 30-59 ml/min (HCC) 08/02/2020   Osteopenia 01/25/2020   B12 deficiency 10/22/2018   Rosacea 10/21/2018   Bilateral lower extremity edema 10/21/2018   Diabetic peripheral neuropathy associated with type 2  diabetes mellitus (HCC), on Gabapentin 04/18/2018   Gout, intermittent, prn colchicine  04/18/2018   Balance problem, uses cane 03/17/2018   Hepatic steatosis 03/14/2018   Polycythemia, secondary, followed by Heme, therapeutic phlebotomy 10/15/2017   Anxiety, uses prn Xanax 05/27/2017   Mild obstructive sleep apnea-hypopnea syndrome but with sig desats, not on CPAP 01/28/2017   Type 2 diabetes mellitus with hyperglycemia, without long-term current use of insulin (HCC)    CAD, BMS to LAD 02/2008    Hypothyroidism (acquired), stable on Levothyroxine    COPD GOLD 0/ prob AB still smoking  11/07/2016   S/P coronary artery stent placement 04/16/2016   Hypertensive heart disease 04/16/2016   Hyperlipidemia associated with type 2 diabetes mellitus (HCC), on Lipitor 04/16/2016   Nicotine dependence, cigarrettes, precontemplative 04/16/2016   Morbid (severe) obesity due to excess calories (HCC) 04/16/2016    Medication Management  Current Outpatient Medications:    Accu-Chek FastClix Lancets MISC, USE TO TEST UP TO FOUR TIMES DAILY AS DIRECTED, Disp: 306 each, Rfl: 2   ACCU-CHEK GUIDE test strip, USE AS DIRECTED, Disp: 200 strip, Rfl: 3   albuterol (ACCUNEB) 1.25 MG/3ML nebulizer solution, TAKE 3 MLS BY NEBULIZATION THREE TIMES DAILY AS NEEDED FOR WHEEZING, Disp: 75 mL, Rfl: 2   albuterol (VENTOLIN HFA) 108 (90 Base) MCG/ACT inhaler, INHALE 2 PUFFS INTO THE LUNGS EVERY 6 HOURS AS NEEDED FOR WHEEZING OR SHORTNESS OF BREATH, Disp: 6.7 g, Rfl: 1   ALPRAZolam (XANAX) 0.5 MG tablet, TAKE 1 TABLET(0.5 MG) BY MOUTH TWICE DAILY AS NEEDED FOR ANXIETY, Disp: 60 tablet, Rfl: 2   aspirin 81 MG tablet, Take 81 mg by mouth daily., Disp: , Rfl:    atorvastatin (LIPITOR) 10 MG tablet, TAKE 1 TABLET(10 MG) BY MOUTH DAILY, Disp: 90 tablet, Rfl: 3   blood glucose meter kit and supplies KIT, Dispense based on patient and insurance preference. Use up to four times daily as directed. DX E11.8, Disp: 1 each, Rfl: 0   budesonide-formoterol (SYMBICORT) 80-4.5 MCG/ACT inhaler, Inhale 2 puffs into the lungs 2  (two) times daily., Disp: 30.6 g, Rfl: 1   Calcium Carb-Cholecalciferol (CALCIUM 500 + D3 PO), Take by mouth., Disp: , Rfl:    canagliflozin (INVOKANA) 300 MG TABS tablet, TAKE 1 TABLET(300 MG) BY MOUTH DAILY BEFORE BREAKFAST, Disp: 90 tablet, Rfl: 1   furosemide (LASIX) 20 MG tablet, Taking 3 times a week (Patient taking differently: Taking 1 times a week), Disp: 90 tablet, Rfl: 1   glipiZIDE (GLUCOTROL XL) 2.5 MG 24 hr tablet, Take 1 tablet (2.5 mg total) by mouth daily with breakfast., Disp: 30 tablet, Rfl: 2   ipratropium (ATROVENT) 0.06 % nasal spray, USE 2 SPRAYS IN EACH NOSTRIL FOUR TIMES DAILY AS DIRECTED, Disp: 45 mL, Rfl: 1   ipratropium-albuterol (DUONEB) 0.5-2.5 (3) MG/3ML SOLN, Take 3 mLs by nebulization every 6 (six) hours as needed., Disp: 360 mL, Rfl: 2   isosorbide mononitrate (IMDUR) 60 MG 24 hr tablet, TAKE 1 TABLET(60 MG) BY MOUTH DAILY, Disp: 90 tablet, Rfl: 3   levothyroxine (SYNTHROID) 100 MCG tablet, TAKE 1 TABLET(100 MCG) BY MOUTH DAILY, Disp: 90 tablet, Rfl: 1   meloxicam (MOBIC) 7.5 MG tablet, Take 1 tablet (7.5 mg total) by mouth daily., Disp: 90 tablet, Rfl: 1   Menthol, Topical Analgesic, (BIOFREEZE) 4 % GEL, Apply topically., Disp: , Rfl:    pantoprazole (PROTONIX) 40 MG tablet, TAKE 1 TABLET(40 MG) BY MOUTH DAILY, Disp: 90 tablet, Rfl: 1   TRADJENTA 5 MG TABS  tablet, TAKE 1 TABLET(5 MG) BY MOUTH DAILY, Disp: 90 tablet, Rfl: 1   vitamin B-12 (CYANOCOBALAMIN) 1000 MCG tablet, Take 1,000 mcg by mouth daily., Disp: , Rfl:    Olopatadine HCl 0.2 % SOLN, Apply 1 drop to eye 2 (two) times daily. (Patient not taking: Reported on 02/09/2023), Disp: 2.5 mL, Rfl: 0  Cognitive Assessment Identity Confirmed: : Name; DOB Cognitive Status: Normal   Functional Assessment Hearing Difficulty or Deaf: no Wear Glasses or Blind: yes Vision Management: can see well w/ glasses Concentrating, Remembering or Making Decisions Difficulty (CP): no Difficulty Communicating: no Difficulty  Eating/Swallowing: no Walking or Climbing Stairs Difficulty: no Dressing/Bathing Difficulty: no Doing Errands Independently Difficulty (such as shopping) (CP): no   Caregiver Assessment  Primary Source of Support/Comfort: child(ren); spouse Name of Support/Comfort Primary Source: spouse,   daughter and son in law live with pt (including 2 teenage grandchildren) People in Home: grandchild(ren); child(ren), adult   Planned Interventions  Provided education to patient about basic DM disease process; Reviewed medications with patient and discussed importance of medication adherence;        Reviewed prescribed diet with patient carbohydrate modified; Counseled on importance of regular laboratory monitoring as prescribed;        Discussed plans with patient for ongoing care management follow up and provided patient with direct contact information for care management team;      Provided patient with written educational materials related to hypo and hyperglycemia and importance of correct treatment;       Advised patient, providing education and rationale, to check cbg three times daily and record        call provider for findings outside established parameters;       Review of patient status, including review of consultants reports, relevant laboratory and other test results, and medications completed;       Screening for signs and symptoms of depression related to chronic disease state;        Assessed social determinant of health barriers;        Provided patient with basic written and verbal COPD education on self care/management/and exacerbation prevention Provided written and verbal instructions on pursed lip breathing and utilized returned demonstration as teach back Provided instruction about proper use of medications used for management of COPD including inhalers Advised patient to self assesses COPD action plan zone and make appointment with provider if in the yellow zone for 48 hours  without improvement Advised patient to engage in light exercise as tolerated 3-5 days a week to aid in the the management of COPD Provided education about and advised patient to utilize infection prevention strategies to reduce risk of respiratory infection Screening for signs and symptoms of depression related to chronic disease state  Assessed social determinant of health barriers  Interaction and coordination with outside resources, practitioners, and providers See CCM Referral  Care Plan: Printed and mailed to patient

## 2023-02-17 ENCOUNTER — Other Ambulatory Visit: Payer: Medicare HMO | Admitting: Pharmacist

## 2023-02-17 NOTE — Progress Notes (Signed)
02/17/2023 Name: [name] [name] MRN: [id number] DOB: 11-Nov-1950  Chief Complaint  Patient presents with   Chronic Care Management    initial    [name] [name] is a 72 y.o. year old female who presented for a telephone visit.   They were referred to the pharmacist by their PCP for assistance in managing medication access, complex medication management, and Chronic Care Management  .   Subjective:  Care Team: Primary Care Provider: Allwardt, Crist Infante, PA-C ; Next Scheduled Visit: 05/06/2023  Podiatrist - next visit 04/20/2023 Cardiologist: [facility]; Next Scheduled Visit: 02/20/2023  Medication Access/Adherence  Current Pharmacy:  [id number] Ginette Otto, No Name - [address] AT [facility] [address] [place] [address] Phone: [phone] Fax: [phone]  [facility] - [place], [place] - [address] [place] [address] Phone: [phone] Fax: [phone]  [facility] - [place], [place] - [address] [place] [address] Phone: [phone] Fax: [phone]   Patient reports affordability concerns with their medications: Yes  - cost of Tradjenta was $125 / 30 days for May 2024 refill. In past was only $11.20. Symbicort and Invokana were filled 12/31/2022 for 90 day supply and was only $11. 20. Called patient's insurance. She has Medicare LIS / Extra Help thru 01/20/2023 which is why cost was higher in May 2024.  Per patient she received a letter from [employer] stating that she was no longer eligible for LIS after addition of her husband's VA benefits to monthly income.   Patient reports access/transportation concerns to their pharmacy: No   Patient reports adherence concerns with their medications:  Yes  due to cost   Diabetes:  Current medications: Invokana 300mg  daily; Tradjenta 5mg  daily, glipizide ER 2.5mg   daily   Medications tried in the past: metformin - stopped due to decreased in renal function; Ozempic - caused severe constipation  Current glucose readings: improving since she completed recent course of steroids.  Blood glucose this morning was 140 and 3 hours after breakfast was 175.   testing 1 to 2 times daily   Patient denies hypoglycemic s/sx including no dizziness, shakiness, sweating. Patient denies hyperglycemic symptoms including no polyuria, polydipsia, polyphagia, nocturia, neuropathy, blurred vision.  Current meal patterns:  - Breakfast: today was eggs, waffles with sugar free syrup and coffee - Lunch usually [place] sandwich on whole wheat break for just [place] rolled up - Supper: hamburger or other meat; no bun; corn, sweet potato or other vegetable / salad - Snacks: munchos (like a chip) or puffed corn - Drinks: water, unsweet tea, coffee with Splenda   Current physical activity: no much yet but plans to start walking in pool once water warms more   Hyperlipidemia/ASCVD Risk Reduction  Current lipid lowering medications: atorvastatin 10mg  daily   Antiplatelet regimen: aspirin 81mg  daily   ASCVD History: CAD  Risk Factors: Type 2 DM; age   COPD:  Current medications: Symbicort 80/4.5 2 puffs twice a day (received 90 ds 12/31/2022)  Reports 2 exacerbations in the past year (took round of prednisone 12/2022 and 07/2022)    Objective:  Lab Results  Component Value Date   HGBA1C 7.3 (A) 01/02/2023    Lab Results  Component Value Date   CREATININE 1.46 (H) 01/02/2023   BUN 22 01/02/2023   NA 140 01/02/2023   K 4.7 01/02/2023  CL 101 01/02/2023   CO2 31 01/02/2023    Lab Results  Component Value Date   CHOL 122 06/12/2022   HDL 41.30 06/12/2022   LDLCALC 41 06/12/2022   LDLDIRECT 60.0 05/16/2019   TRIG 194.0 (H) 06/12/2022   CHOLHDL 3 06/12/2022    Medications Reviewed Today     Reviewed by Henrene Pastor, RPH-CPP (Pharmacist) on  02/17/23 at 1331  Med List Status: <None>   Medication Order Taking? Sig Documenting Provider Last Dose Status Informant  Accu-Chek FastClix Lancets MISC [id number] Yes USE TO TEST UP TO FOUR TIMES DAILY AS DIRECTED Orland Mustard, MD Taking Active   ACCU-CHEK GUIDE test strip [id number] Yes USE AS DIRECTED Allwardt, Crist Infante, PA-C Taking Active   albuterol (ACCUNEB) 1.25 MG/3ML nebulizer solution [id number] Yes TAKE 3 MLS BY NEBULIZATION THREE TIMES DAILY AS NEEDED FOR WHEEZING Allwardt, Alyssa M, PA-C Taking Active   albuterol (VENTOLIN HFA) 108 (90 Base) MCG/ACT inhaler [id number] Yes INHALE 2 PUFFS INTO THE LUNGS EVERY 6 HOURS AS NEEDED FOR WHEEZING OR SHORTNESS OF BREATH Allwardt, Alyssa M, PA-C Taking Active   ALPRAZolam (XANAX) 0.5 MG tablet [id number] Yes TAKE 1 TABLET(0.5 MG) BY MOUTH TWICE DAILY AS NEEDED FOR ANXIETY Allwardt, Alyssa M, PA-C Taking Active   aspirin 81 MG tablet [id number] Yes Take 81 mg by mouth daily. [provider] Taking Active Self  atorvastatin (LIPITOR) 10 MG tablet [id number] Yes TAKE 1 TABLET(10 MG) BY MOUTH DAILY Chilton Si, MD Taking Active   blood glucose meter kit and supplies KIT [id number] Yes Dispense based on patient and insurance preference. Use up to four times daily as directed. DX E11.8 Allwardt, Crist Infante, PA-C Taking Active   budesonide-formoterol (SYMBICORT) 80-4.5 MCG/ACT inhaler [id number] Yes Inhale 2 puffs into the lungs 2 (two) times daily. Allwardt, Crist Infante, PA-C Taking Active   Calcium Carb-Cholecalciferol (CALCIUM 500 + D3 PO) [id number] Yes Take by mouth. [provider] Taking Active   canagliflozin (INVOKANA) 300 MG TABS tablet [id number] Yes TAKE 1 TABLET(300 MG) BY MOUTH DAILY BEFORE BREAKFAST Allwardt, Alyssa M, PA-C Taking Active   furosemide (LASIX) 20 MG tablet [id number] Yes Taking 3 times a week  Patient taking differently: Taking 1 times a week   Allwardt, Alyssa M, PA-C Taking Active   glipiZIDE (GLUCOTROL XL) 2.5  MG 24 hr tablet [id number] Yes Take 1 tablet (2.5 mg total) by mouth daily with breakfast. Allwardt, Crist Infante, PA-C Taking Active   ipratropium (ATROVENT) 0.06 % nasal spray [id number] Yes USE 2 SPRAYS IN EACH NOSTRIL FOUR TIMES DAILY AS DIRECTED Allwardt, Alyssa M, PA-C Taking Active   ipratropium-albuterol (DUONEB) 0.5-2.5 (3) MG/3ML SOLN [id number] Yes Take 3 mLs by nebulization every 6 (six) hours as needed. Allwardt, Crist Infante, PA-C Taking Active   isosorbide mononitrate (IMDUR) 60 MG 24 hr tablet [id number] Yes TAKE 1 TABLET(60 MG) BY MOUTH DAILY Chilton Si, MD Taking Active   levothyroxine (SYNTHROID) 100 MCG tablet [id number] Yes TAKE 1 TABLET(100 MCG) BY MOUTH DAILY Allwardt, Alyssa M, PA-C Taking Active   meloxicam (MOBIC) 7.5 MG tablet [id number] Yes Take 1 tablet (7.5 mg total) by mouth daily. Allwardt, Crist Infante, PA-C Taking Active   Menthol, Topical Analgesic, (BIOFREEZE) 4 % GEL [id number] Yes Apply topically. [provider] Taking Active   Olopatadine HCl 0.2 % SOLN [id number]  Apply 1 drop to eye 2 (two) times daily. Allwardt, Crist Infante, PA-C  Active   pantoprazole (PROTONIX) 40 MG tablet [id number] Yes TAKE 1 TABLET(40 MG) BY MOUTH  DAILY Allwardt, Crist Infante, PA-C Taking Active   TRADJENTA 5 MG TABS tablet [id number] Yes TAKE 1 TABLET(5 MG) BY MOUTH DAILY Allwardt, Alyssa M, PA-C Taking Active   vitamin B-12 (CYANOCOBALAMIN) 1000 MCG tablet [id number] Yes Take 1,000 mcg by mouth daily. [provider] Taking Active               Assessment/Plan:   Diabetes: blood glucose had increased due to prednisone in April but is improving. Last A1c was 7.3.  - Reviewed goal A1c, goal fasting, and goal 2 hour post prandial glucose - Reviewed dietary modifications including: limiting CHO amount to 45 grams or less for meals and 15 to 20 grams for snacks. Discussed food high in CHO and serving sizes.  - Reviewed lifestyle modifications including: exercise - encouraged to  continue with plan to start pool exercise.  - Due to losing LIS 01/20/2023 cost of medications has increased. Could change Tradjenta to Januvia and apply for medication assistance program for Januiva or could increase glipizide to 5mg  ER once daily.  - Will ask PCP about preference.  - Consider lowering dose of Invokana to 100mg  daily since last eGFR was between 30 and 60 mL/min. Could also apply for medication assistance program for Invokana (patient's income was over limit for medication assistance program for [place] or Farxiga). Will discuss with PCP. - Recommend to check glucose 1 to 2 times per day  Hyperlipidemia/ASCVD Risk Reduction: LDL at goal of < 55; Triglycerides slightly above goal of < 150. - Recommend to continue atorvastatin 10mg  daily.  COPD: 2 exacerbations in the last year.  - Screened for medication assistance program for either [employer] Virgel Bouquet / Trelegy) or Astra Zeneca Markus Daft) patient did not qualify.  June 1st there is suppose to be lower cost for all inhalers with max out of pocket of $35. Not sure is this will extend to Medicare patients or not.  Patient has enough Symbicort for 2 more months. Will check on cost of Symbicort in June 2024.     Follow Up Plan: 2 weeks   Henrene Pastor, PharmD Clinical Pharmacist [facility]

## 2023-02-17 NOTE — Patient Instructions (Signed)
  Increase non-starchy vegetables - carrots, green bean, squash, zucchini, tomatoes, onions, peppers, spinach and other green leafy vegetables, cabbage, lettuce, cucumbers, asparagus, okra (not fried), eggplant  Limit sugar and processed foods (cakes, cookies, ice cream, crackers and chips) Increase fresh fruit but limit serving sizes 1/2 cup or about the size of tennis or baseball  Limit red meat to no more than 1-2 times per week (serving size about the size of your palm)  Choose whole grains / lean proteins - whole wheat bread, quinoa, whole grain rice (1/2 cup), fish, chicken, [place]  Avoid sugar and calorie containing beverages - soda, sweet tea and juice.  Choose water or unsweetened tea instead.

## 2023-02-20 ENCOUNTER — Encounter: Payer: Self-pay | Admitting: Pharmacist

## 2023-02-20 ENCOUNTER — Encounter (HOSPITAL_BASED_OUTPATIENT_CLINIC_OR_DEPARTMENT_OTHER): Payer: Self-pay | Admitting: Family

## 2023-02-20 ENCOUNTER — Telehealth: Payer: Self-pay | Admitting: Pharmacist

## 2023-02-20 ENCOUNTER — Ambulatory Visit (HOSPITAL_BASED_OUTPATIENT_CLINIC_OR_DEPARTMENT_OTHER): Payer: Medicare HMO | Admitting: Family

## 2023-02-20 ENCOUNTER — Other Ambulatory Visit: Payer: Self-pay | Admitting: Physician Assistant

## 2023-02-20 VITALS — BP 116/64 | HR 87 | Ht 65.0 in | Wt 295.0 lb

## 2023-02-20 DIAGNOSIS — F17210 Nicotine dependence, cigarettes, uncomplicated: Secondary | ICD-10-CM

## 2023-02-20 DIAGNOSIS — I25118 Atherosclerotic heart disease of native coronary artery with other forms of angina pectoris: Secondary | ICD-10-CM | POA: Diagnosis not present

## 2023-02-20 DIAGNOSIS — F419 Anxiety disorder, unspecified: Secondary | ICD-10-CM

## 2023-02-20 DIAGNOSIS — E1159 Type 2 diabetes mellitus with other circulatory complications: Secondary | ICD-10-CM

## 2023-02-20 DIAGNOSIS — E785 Hyperlipidemia, unspecified: Secondary | ICD-10-CM | POA: Diagnosis not present

## 2023-02-20 DIAGNOSIS — J449 Chronic obstructive pulmonary disease, unspecified: Secondary | ICD-10-CM

## 2023-02-20 DIAGNOSIS — E1165 Type 2 diabetes mellitus with hyperglycemia: Secondary | ICD-10-CM

## 2023-02-20 MED ORDER — CANAGLIFLOZIN 100 MG PO TABS: 100.0000 mg | ORAL_TABLET | Freq: Every day | ORAL | 1 refills | Status: DC

## 2023-02-20 MED ORDER — GLIPIZIDE ER 5 MG PO TB24
5.0000 mg | ORAL_TABLET | Freq: Every day | ORAL | 0 refills | Status: AC
Start: 2023-02-20 — End: 2023-05-21

## 2023-02-20 MED ORDER — ALPRAZOLAM 0.5 MG PO TABS
ORAL_TABLET | ORAL | 2 refills | Status: DC
Start: 2023-02-20 — End: 2023-07-24

## 2023-02-20 NOTE — Telephone Encounter (Signed)
Tried to contact patient regarding medication changes - increase glipizide ER to 5mg  daily and decrease Invokana to 100mg  daily.   Also plan to discuss medication assistance program options. I thought she could apply for Invokana medication assistance program but per Med Assistance team, only able to apply if she has no insurance.

## 2023-02-20 NOTE — Telephone Encounter (Signed)
Patient called back. Informed about stopping Tradjenta, increasing glipizide ER to 5mg  daily and lower dose of Invokana to 100mg  (will half 300mg  to 150mg  until she completes current Rx due to cost of Invokana)   While on the phone patient requested refill for alprazolam.   Last refill was 12/17/2022 for #30 tablets.   Pharmacy: [facility] [address]

## 2023-02-20 NOTE — Patient Instructions (Signed)
Medication Instructions:  Continue your current medications.   *If you need a refill on your cardiac medications before your next appointment, please call your pharmacy*  Follow-Up: At [facility], you and your health needs are our priority.  As part of our continuing mission to provide you with exceptional heart care, we have created designated Provider Care Teams.  These Care Teams include your primary Cardiologist (physician) and Advanced Practice Providers (APPs -  Physician Assistants and Nurse Practitioners) who all work together to provide you with the care you need, when you need it.  Your next appointment:   1 year(s)  Provider:   Chilton Si, MD    Other Instructions  Heart Healthy Diet Recommendations: A low-salt diet is recommended. Meats should be grilled, baked, or boiled. Avoid fried foods. Focus on lean protein sources like fish or chicken with vegetables and fruits. The American Heart Association is a Chief Technology Officer!  American Heart Association Diet and Lifeystyle Recommendations   Exercise recommendations: The American Heart Association recommends 150 minutes of moderate intensity exercise weekly. Try 30 minutes of moderate intensity exercise 4-5 times per week. This could include walking, jogging, or swimming. If you want to participate in physical therapy in the pool at [facility] simply let us know and we will place a referral.

## 2023-02-20 NOTE — Telephone Encounter (Signed)
-----   Message from Bary Leriche, PA-C sent at 02/18/2023  1:34 PM EDT ----- Babette Relic,  Appreciate you working with patient! Thanks for the info.  Let's stop the Tradjenta at this time. Increase the glipizide ER to 5 mg daily. Decrease the Invokana to 100 mg daily and apply for medication assistance program.   Let me know next week about cost for Symbicort and if this applies to Medicare as well or not.  Thanks, Alyssa Allwardt, PA-C  ----- Message ----- From: Henrene Pastor, RPH-CPP Sent: 02/17/2023   3:45 PM EDT To: Crist Infante Allwardt, PA-C  Patient had LIS which is why brand name medications were $11 in April but LIS was stopped in 01/20/2023 - per patient she received letter that she no longer qualified from [employer].  I screened for medication assistance program for Tradjenta - patient did not qualify. She might qualify for Januvia medication assistance program - would need to adjust dose to 50mg  daily with eGFR 50 to 30 or 25mg  daily if eGFR < 30.  Could also continue glipizide ER and increase to 5mg  daily and not restart DPP4.  Patient is also in coverage gap. She has enough Symbicort for 2 more months. Cost is suppose to change to max out of pocket of $35 02/21/2023 but not sure criteria for this and if Medicare patients will be included.  We can apply for Invokana medication assistance program. Since her last eGFR was between 30 and 1mL/min - consider lowering dose to 100mg  daily

## 2023-02-20 NOTE — Progress Notes (Unsigned)
Office Visit    Patient Name: [name] [name] Date of Encounter: 02/20/2023  PCP:  Bethanie Dicker   [facility]  Cardiologist:  Chilton Si, MD  Advanced Practice Provider:  No care team member to display Electrophysiologist:  None      Chief Complaint    [name] [name] is a 72 y.o. female presents today for CADs follow up   Past Medical History    Past Medical History:  Diagnosis Date   Bronchitis, asthmatic    Coronary artery disease    BMS to LAD 02/2008   Diabetes mellitus without complication (HCC)    Hyperlipidemia    Hypertensive heart disease 04/16/2016   Hypothyroidism    MI, old    Morbid obesity (HCC) 04/16/2016   S/P coronary artery stent placement 04/16/2016   BMS to LAD 2009   Tobacco abuse 04/16/2016   Past Surgical History:  Procedure Laterality Date   ABDOMINAL HYSTERECTOMY     35; very heavy bleeding   ANKLE SURGERY Right    CHOLECYSTECTOMY     CORONARY ANGIOPLASTY  02/2008   bare metal stent to LAD    CORONARY STENT PLACEMENT     KNEE ARTHROSCOPY     OOPHORECTOMY Left    age 81   ROTATOR CUFF REPAIR Bilateral    THORACIC OUTLET SURGERY     WRIST SURGERY      Allergies  Allergies  Allergen Reactions   Clindamycin/Lincomycin Other (See Comments)    Head ache    Codeine Hives   Cortisone    Dilaudid [Hydromorphone Hcl] Nausea And Vomiting   Iodides    Reglan [Metoclopramide] Other (See Comments)    Other reaction(s): Dystonia Hallucinations/Violent   Tramadol    Tape Rash    Blisters     History of Present Illness    [name] [name] is a 72 y.o. female with a hx of *** last seen in 2022 by Dr. Duke Salvia.  Presents today for follow up with her husband. Lives with her husband and her daughter, daughter's boyfriend, his son, and her grandson all live with her. Pleasant lady who is originally from [place]. Notes exertional dyspnea with minimal activities such as showering. Exacerbated by sciatic nerve.  Endorses smoking and has cut way back.  Has had weight gain and is hopeful to start exercising in the pool at her home.   Follows with PCP regarding her asthma.  She uses her Symbicort regularly and also has has albuterol inhaler and nebulizer. Using rescue inhaler twice per day with improvement in breathing.   Reports rare pain in her right breast. Her prior anignal equivalent is left sided chest pain which has not recurred.   She takes her Lasix every other week. ***  EKGs/Labs/Other Studies Reviewed:   The following studies were reviewed today: Cardiac Studies & Procedures     STRESS TESTS  MYOCARDIAL PERFUSION IMAGING 03/02/2019  Narrative  Nuclear stress EF: 79%. The left ventricular ejection fraction is hyperdynamic (>65%). The apex was not visualized. Suggest echocardiogram for further evaluation  There was no ST segment deviation noted during stress.  Defect 1: There is a small defect of moderate severity present in the apical anterior, apical septal and apex location. This appears to be c/w a previous apical MI with perhaps a small amount of peri-infarct ischemi a  Findings consistent with prior apical myocardial infarction.  This is a low risk study.   ECHOCARDIOGRAM  ECHOCARDIOGRAM COMPLETE  03/11/2019  Narrative ECHOCARDIOGRAM REPORT    Patient Name:   [name] [name]   Date of Exam: 03/11/2019 Medical Rec #:  [id number]     Height:       65.0 in Accession #:    [id number]    Weight:       286.0 lb Date of Birth:  [birth year]     BSA:          2.30 m Patient Age:    67 years      BP:           136/86 mmHg Patient Gender: F             HR:           86 bpm. Exam Location:  [address]   Procedure: 2D Echo, Cardiac Doppler, Color Doppler and Intracardiac Opacification Agent  Indications:    R07.9 Chest pain I25.10 Coronary artery disease.  History:        Patient has no prior history of Echocardiogram examinations. Risk Factors: Morbid Obesity, Current Smoker,  Hypertension and Diabetes. Myocardial infarction. Chest pain. Anxiety. Coronary artery disease. Hypothyroidism.  Sonographer:    Daphine Deutscher RDCS Referring Phys: [id number] CALLIE E GOODRICH  IMPRESSIONS   1. The left ventricle has hyperdynamic systolic function, with an ejection fraction of >65%. The cavity size was normal. Left ventricular diastolic Doppler parameters are consistent with impaired relaxation. 2. The right ventricle has normal systolic function. The cavity was normal. 3. The mitral valve is grossly normal. There is moderate mitral annular calcification present. 4. The tricuspid valve is grossly normal. 5. The aortic valve was not well visualized. No stenosis of the aortic valve. 6. Technically difficult; definity used; vigorous LV systolic function; mild diastolic dysfunction.  FINDINGS Left Ventricle: The left ventricle has hyperdynamic systolic function, with an ejection fraction of >65%. The cavity size was normal. There is no increase in left ventricular wall thickness. Left ventricular diastolic Doppler parameters are consistent with impaired relaxation. Definity contrast agent was given IV to delineate the left ventricular endocardial borders.  Right Ventricle: The right ventricle has normal systolic function. The cavity was normal.  Left Atrium: Left atrial size was normal in size.  Right Atrium: Right atrial size was normal in size.  Interatrial Septum: No atrial level shunt detected by color flow Doppler.  Pericardium: There is no evidence of pericardial effusion.  Mitral Valve: The mitral valve is grossly normal. There is moderate mitral annular calcification present. Mitral valve regurgitation is not visualized by color flow Doppler.  Tricuspid Valve: The tricuspid valve is grossly normal. Tricuspid valve regurgitation was not visualized by color flow Doppler.  Aortic Valve: The aortic valve was not well visualized Aortic valve regurgitation was  not visualized by color flow Doppler. There is No stenosis of the aortic valve.  Pulmonic Valve: The pulmonic valve was not well visualized. Pulmonic valve regurgitation is not visualized by color flow Doppler.  Venous: The inferior vena cava was not well visualized.  Additional Comments: Technically difficult; definity used; vigorous LV systolic function; mild diastolic dysfunction.   +--------------+--------++ LEFT VENTRICLE         +----------------+---------++ +--------------+--------++ Diastology                PLAX 2D                +----------------+---------++ +--------------+--------++ LV e' lateral:  6.74 cm/s LVIDd:        3.90 cm  +----------------+---------++ +--------------+--------++ LV  E/e' lateral:11.4      LVIDs:        2.20 cm  +----------------+---------++ +--------------+--------++ LV e' medial:   6.42 cm/s LV PW:        0.70 cm  +----------------+---------++ +--------------+--------++ LV E/e' medial: 12.0      LV IVS:       1.00 cm  +----------------+---------++ +--------------+--------++ LVOT diam:    1.70 cm  +--------------+--------++ LV SV:        50 ml    +--------------+--------++ LV SV Index:  19.77    +--------------+--------++ LVOT Area:    2.27 cm +--------------+--------++                        +--------------+--------++  +---------------+----------++ RIGHT VENTRICLE           +---------------+----------++ RV Basal diam: 3.90 cm    +---------------+----------++ RV S prime:    14.10 cm/s +---------------+----------++ TAPSE (M-mode):1.8 cm     +---------------+----------++  +---------------+-------++-----------++ LEFT ATRIUM           Index       +---------------+-------++-----------++ LA diam:       4.30 cm1.87 cm/m  +---------------+-------++-----------++ LA Vol (A2C):  32.2 ml13.98 ml/m +---------------+-------++-----------++ LA Vol  (A4C):  36.4 ml15.81 ml/m +---------------+-------++-----------++ LA Biplane Vol:36.0 ml15.63 ml/m +---------------+-------++-----------++ +------------+--------++----------++ RIGHT ATRIUM        Index      +------------+--------++----------++ RA Area:    9.71 cm           +------------+--------++----------++ RA Volume:  19.80 ml8.60 ml/m +------------+--------++----------++ +------------+-----------++ AORTIC VALVE            +------------+-----------++ LVOT Vmax:  117.50 cm/s +------------+-----------++ LVOT Vmean: 91.150 cm/s +------------+-----------++ LVOT VTI:   0.262 m     +------------+-----------++  +-------------+-------++ AORTA                +-------------+-------++ Ao Root diam:2.90 cm +-------------+-------++  +--------------+----------++ MITRAL VALVE              +--------------+-------+ +--------------+----------++  SHUNTS                MV Area (PHT):2.80 cm    +--------------+-------+ +--------------+----------++  Systemic VTI: 0.26 m  MV PHT:       78.59 msec  +--------------+-------+ +--------------+----------++  Systemic Diam:1.70 cm MV Decel Time:271 msec    +--------------+-------+ +--------------+----------++ +--------------+-----------++ MV E velocity:77.10 cm/s  +--------------+-----------++ MV A velocity:118.00 cm/s +--------------+-----------++ MV E/A ratio: 0.65        +--------------+-----------++   Olga Millers MD Electronically signed by Olga Millers MD Signature Date/Time: 03/11/2019/12:53:54 PM    Final              EKG:  EKG is *** ordered today.  The ekg ordered today demonstrates ***  Recent Labs: 06/12/2022: TSH 2.64 01/02/2023: ALT 23; BUN 22; Creatinine, Ser 1.46; Hemoglobin 17.4; Magnesium 1.7; Platelets 157.0; Potassium 4.7; Pro B Natriuretic peptide (BNP) 41.0; Sodium 140  Recent Lipid Panel    Component Value Date/Time    CHOL 122 06/12/2022 1144   TRIG 194.0 (H) 06/12/2022 1144   HDL 41.30 06/12/2022 1144   CHOLHDL 3 06/12/2022 1144   VLDL 38.8 06/12/2022 1144   LDLCALC 41 06/12/2022 1144   LDLCALC 57 04/26/2020 1208   LDLDIRECT 60.0 05/16/2019 1055    Risk Assessment/Calculations:  {Does this patient have ATRIAL FIBRILLATION?:[phone]}  Home Medications   Current Meds  Medication Sig   Accu-Chek FastClix Lancets MISC USE TO TEST UP TO  FOUR TIMES DAILY AS DIRECTED   ACCU-CHEK GUIDE test strip USE AS DIRECTED   albuterol (ACCUNEB) 1.25 MG/3ML nebulizer solution TAKE 3 MLS BY NEBULIZATION THREE TIMES DAILY AS NEEDED FOR WHEEZING   albuterol (VENTOLIN HFA) 108 (90 Base) MCG/ACT inhaler INHALE 2 PUFFS INTO THE LUNGS EVERY 6 HOURS AS NEEDED FOR WHEEZING OR SHORTNESS OF BREATH   ALPRAZolam (XANAX) 0.5 MG tablet TAKE 1 TABLET(0.5 MG) BY MOUTH TWICE DAILY AS NEEDED FOR ANXIETY   aspirin 81 MG tablet Take 81 mg by mouth daily.   atorvastatin (LIPITOR) 10 MG tablet TAKE 1 TABLET(10 MG) BY MOUTH DAILY   blood glucose meter kit and supplies KIT Dispense based on patient and insurance preference. Use up to four times daily as directed. DX E11.8   budesonide-formoterol (SYMBICORT) 80-4.5 MCG/ACT inhaler Inhale 2 puffs into the lungs 2 (two) times daily.   Calcium Carb-Cholecalciferol (CALCIUM 500 + D3 PO) Take by mouth.   canagliflozin (INVOKANA) 100 MG TABS tablet Take 1 tablet (100 mg total) by mouth daily before breakfast.   furosemide (LASIX) 20 MG tablet Taking 3 times a week (Patient taking differently: Taking 1 times a week)   glipiZIDE (GLUCOTROL XL) 5 MG 24 hr tablet Take 1 tablet (5 mg total) by mouth daily with breakfast.   ipratropium (ATROVENT) 0.06 % nasal spray USE 2 SPRAYS IN EACH NOSTRIL FOUR TIMES DAILY AS DIRECTED   ipratropium-albuterol (DUONEB) 0.5-2.5 (3) MG/3ML SOLN Take 3 mLs by nebulization every 6 (six) hours as needed.   isosorbide mononitrate (IMDUR) 60 MG 24 hr tablet TAKE 1  TABLET(60 MG) BY MOUTH DAILY   levothyroxine (SYNTHROID) 100 MCG tablet TAKE 1 TABLET(100 MCG) BY MOUTH DAILY   meloxicam (MOBIC) 7.5 MG tablet Take 1 tablet (7.5 mg total) by mouth daily.   Menthol, Topical Analgesic, (BIOFREEZE) 4 % GEL Apply topically.   Olopatadine HCl 0.2 % SOLN Apply 1 drop to eye 2 (two) times daily.   pantoprazole (PROTONIX) 40 MG tablet TAKE 1 TABLET(40 MG) BY MOUTH DAILY   vitamin B-12 (CYANOCOBALAMIN) 1000 MCG tablet Take 1,000 mcg by mouth daily.     Review of Systems   ***   All other systems reviewed and are otherwise negative except as noted above.  Physical Exam    VS:  BP 116/64   Pulse 87   Ht 5\' 5"  (1.651 m)   Wt 295 lb (133.8 kg)   LMP  (LMP Unknown)   SpO2 94%   BMI 49.09 kg/m  , BMI Body mass index is 49.09 kg/m.  Wt Readings from Last 3 Encounters:  02/20/23 295 lb (133.8 kg)  02/09/23 293 lb (132.9 kg)  02/03/23 293 lb (132.9 kg)     GEN: Well nourished, well developed, in no acute distress. HEENT: normal. Neck: Supple, no JVD, carotid bruits, or masses. Cardiac: ***RRR, no murmurs, rubs, or gallops. No clubbing, cyanosis, edema.  ***Radials/PT 2+ and equal bilaterally.  Respiratory:  ***Respirations regular and unlabored, clear to auscultation bilaterally. GI: Soft, nontender, nondistended. MS: No deformity or atrophy. Skin: Warm and dry, no rash. Neuro:  Strength and sensation are intact. Psych: Normal affect.  Assessment & Plan    *** HLD, LDL goal <70 - 05/2022 LDL 41. Continue Atorvastatin. *** DM2 -          Disposition: Follow up in 1 year(s) with Chilton Si, MD or APP.  Signed, Alver Sorrow, NP 02/20/2023, 3:57 PM [facility]

## 2023-02-22 ENCOUNTER — Encounter (HOSPITAL_BASED_OUTPATIENT_CLINIC_OR_DEPARTMENT_OTHER): Payer: Self-pay | Admitting: Family

## 2023-03-13 ENCOUNTER — Other Ambulatory Visit: Payer: Medicare HMO | Admitting: Pharmacist

## 2023-03-13 NOTE — Progress Notes (Signed)
03/13/2023 Name: [name] [name] MRN: [id number] DOB: 11-Apr-1951  No chief complaint on file.   [name] [name] is a 72 y.o. year old female who presented for a telephone visit.   They were referred to the pharmacist by their PCP for assistance in managing medication access, complex medication management, and Chronic Care Management  .   Subjective:  Care Team: Primary Care Provider: Allwardt, Crist Infante, PA-C ; Next Scheduled Visit: 03/17/2023 for shoulder pain and 05/06/2023 Podiatrist - next visit 04/20/2023 Cardiologist: Dr Duke Salvia [facility]; Next Scheduled Visit: recall planned for 01/2024  Medication Access/Adherence  Current Pharmacy:  [id number] [place] Otto, [place] - [address] AT [facility] & [address] [place] [address] Phone: [phone] Fax: [phone]  [facility] - [place], [place] - [address] [place] [address] Phone: [phone] Fax: [phone]  [facility] - [place], [place] - [address] [place] [address] Phone: [phone] Fax: [phone]   Patient reports affordability concerns with their medications: Yes  - She had Medicare LIS / Extra Help thru 01/20/2023. Patientreceived a letter from [employer] stating that she was no longer eligible for LIS after addition of her husband's VA benefits to monthly income.   Patient reports access/transportation concerns to their pharmacy: No   Patient reports adherence concerns with their medications:  Yes  due to cost   Diabetes:  Current medications: Invokana 100mg  daily (hasn't started yet - she is taking 0.5 tablet of 300mg  to = 150mg  daily) ; glipizide ER 5mg  daily   Medications tried in the past: metformin - stopped due to decreased in renal function; Ozempic - caused severe constipation; Tradgenta 5mg  - stopped due to cost;   Current glucose  readings: improving since increase in glipizide to 5mg  Blood glucose at home: 97, 117, 118, 130 Denies blood glucose < 80 or > 200 over the last 2 weeks.    testing 1 to 2 times daily   Patient denies hypoglycemic s/sx including no dizziness, shakiness, sweating. Patient denies hyperglycemic symptoms including no polyuria, polydipsia, polyphagia, nocturia, neuropathy, blurred vision.  Current physical activity: started walking in pool    Hyperlipidemia/ASCVD Risk Reduction  Current lipid lowering medications: atorvastatin 10mg  daily   Antiplatelet regimen: aspirin 81mg  daily   ASCVD History: CAD  Risk Factors: Type 2 DM; age   COPD:  Current medications: Symbicort 80/4.5 2 puffs twice a day (received 90 ds 12/31/2022)  Reports 2 exacerbations in the past year (took round of prednisone 12/2022 and 07/2022)    Objective:  Lab Results  Component Value Date   HGBA1C 7.3 (A) 01/02/2023    Lab Results  Component Value Date   CREATININE 1.46 (H) 01/02/2023   BUN 22 01/02/2023   NA 140 01/02/2023   K 4.7 01/02/2023   CL 101 01/02/2023   CO2 31 01/02/2023    Lab Results  Component Value Date   CHOL 122 06/12/2022   HDL 41.30 06/12/2022   LDLCALC 41 06/12/2022   LDLDIRECT 60.0 05/16/2019   TRIG 194.0 (H) 06/12/2022   CHOLHDL 3 06/12/2022    Medications Reviewed Today     Reviewed by Henrene Pastor, RPH-CPP (Pharmacist) on 03/13/23 at 1411  Med List Status: <None>   Medication Order Taking? Sig Documenting Provider Last Dose Status Informant  Accu-Chek FastClix Lancets MISC [id number] Yes USE TO TEST  UP TO FOUR TIMES DAILY AS DIRECTED Orland Mustard, MD Taking Active   ACCU-CHEK GUIDE test strip [id number] Yes USE AS DIRECTED Allwardt, Crist Infante, PA-C Taking Active   albuterol (ACCUNEB) 1.25 MG/3ML nebulizer solution [id number] Yes TAKE 3 MLS BY NEBULIZATION THREE TIMES DAILY AS NEEDED FOR WHEEZING Allwardt, Alyssa M, PA-C Taking Active   albuterol (VENTOLIN HFA)  108 (90 Base) MCG/ACT inhaler [id number] Yes INHALE 2 PUFFS INTO THE LUNGS EVERY 6 HOURS AS NEEDED FOR WHEEZING OR SHORTNESS OF BREATH Allwardt, Alyssa M, PA-C Taking Active   ALPRAZolam (XANAX) 0.5 MG tablet [id number] Yes TAKE 1 TABLET(0.5 MG) BY MOUTH TWICE DAILY AS NEEDED FOR ANXIETY Allwardt, Alyssa M, PA-C Taking Active   aspirin 81 MG tablet [id number] Yes Take 81 mg by mouth daily. [provider] Taking Active Self  atorvastatin (LIPITOR) 10 MG tablet [id number] Yes TAKE 1 TABLET(10 MG) BY MOUTH DAILY Chilton Si, MD Taking Active   blood glucose meter kit and supplies KIT [id number] Yes Dispense based on patient and insurance preference. Use up to four times daily as directed. DX E11.8 Allwardt, Crist Infante, PA-C Taking Active   budesonide-formoterol (SYMBICORT) 80-4.5 MCG/ACT inhaler [id number] Yes Inhale 2 puffs into the lungs 2 (two) times daily. Allwardt, Crist Infante, PA-C Taking Active   Calcium Carb-Cholecalciferol (CALCIUM 500 + D3 PO) [id number] Yes Take by mouth. [provider] Taking Active   canagliflozin (INVOKANA) 100 MG TABS tablet [id number] No Take 1 tablet (100 mg total) by mouth daily before breakfast.  Patient not taking: Reported on 03/13/2023   Allwardt, Crist Infante, PA-C Not Taking Active            Med Note Clydie Braun, Milynn Quirion B   Fri Mar 13, 2023  2:11 PM) Taking 0.5 tab of 300mg  = 150mg  daily until finishes current prescripton  furosemide (LASIX) 20 MG tablet [id number]  Taking 3 times a week  Patient taking differently: Taking 1 times a week   Allwardt, Alyssa M, PA-C  Active   glipiZIDE (GLUCOTROL XL) 5 MG 24 hr tablet [id number] Yes Take 1 tablet (5 mg total) by mouth daily with breakfast. Allwardt, Crist Infante, PA-C Taking Active   ipratropium (ATROVENT) 0.06 % nasal spray [id number] Yes USE 2 SPRAYS IN EACH NOSTRIL FOUR TIMES DAILY AS DIRECTED Allwardt, Alyssa M, PA-C Taking Active   ipratropium-albuterol (DUONEB) 0.5-2.5 (3) MG/3ML SOLN [id number] Yes Take 3 mLs  by nebulization every 6 (six) hours as needed. Allwardt, Crist Infante, PA-C Taking Active   isosorbide mononitrate (IMDUR) 60 MG 24 hr tablet [id number] Yes TAKE 1 TABLET(60 MG) BY MOUTH DAILY Chilton Si, MD Taking Active   levothyroxine (SYNTHROID) 100 MCG tablet [id number] Yes TAKE 1 TABLET(100 MCG) BY MOUTH DAILY Allwardt, Alyssa M, PA-C Taking Active   meloxicam (MOBIC) 7.5 MG tablet [id number] Yes Take 1 tablet (7.5 mg total) by mouth daily. Allwardt, Crist Infante, PA-C Taking Active   Menthol, Topical Analgesic, (BIOFREEZE) 4 % GEL [id number] Yes Apply topically. [provider] Taking Active   Olopatadine HCl 0.2 % SOLN [id number] Yes Apply 1 drop to eye 2 (two) times daily. Allwardt, Crist Infante, PA-C Taking Active   pantoprazole (PROTONIX) 40 MG tablet [id number] Yes TAKE 1 TABLET(40 MG) BY MOUTH DAILY Allwardt, Alyssa M, PA-C Taking Active   vitamin B-12 (CYANOCOBALAMIN) 1000 MCG tablet [id number] Yes Take 1,000 mcg by mouth daily. [provider] Taking Active               Assessment/Plan:   Diabetes: blood  glucose improving. Last A1c was 7.3.  - Reviewed goal A1c, goal fasting, and goal 2 hour post prandial glucose - Reviewed lifestyle modifications including: exercise - encouraged to continue with plan to start pool exercise.  - Continue Invokana and glipizide - Recommend to check glucose 1 to 2 times per day - COordinating with Med Assist team to apply for medication assistance program for Invokana.   Hyperlipidemia/ASCVD Risk Reduction: LDL at goal of < 55; Triglycerides slightly above goal of < 150. - Recommend to continue atorvastatin 10mg  daily.  COPD: 2 exacerbations in the last year.  - Continue Symbicort (has 1 inhaler left) - Consider change to [facility] since she has had 2 exacerbations in the last year. She might qualify for medication assistance program for [employer]. Will consult with PCP  Medication Assistance:  Per patient there eare 6 people in her  household (patient, her husband, her daughter and her boyfriend and 2 grandchildren under age 70yo).   Follow Up Plan: 2 to 4 weeks.    Henrene Pastor, PharmD Clinical Pharmacist [facility]

## 2023-03-16 ENCOUNTER — Telehealth: Payer: Self-pay

## 2023-03-16 ENCOUNTER — Other Ambulatory Visit: Payer: Self-pay | Admitting: Physician Assistant

## 2023-03-16 ENCOUNTER — Telehealth: Payer: Self-pay | Admitting: Pharmacist

## 2023-03-16 DIAGNOSIS — J449 Chronic obstructive pulmonary disease, unspecified: Secondary | ICD-10-CM

## 2023-03-16 MED ORDER — BREZTRI AEROSPHERE 160-9-4.8 MCG/ACT IN AERO: 2.0000 | INHALATION_SPRAY | Freq: Two times a day (BID) | RESPIRATORY_TRACT | 3 refills | Status: DC

## 2023-03-16 NOTE — Addendum Note (Signed)
Addended by: Ila Mcgill on: 03/16/2023 03:25 PM   Modules accepted: Orders

## 2023-03-16 NOTE — Progress Notes (Signed)
Addendum:  Provider approved patient change from Symbicort to [facility]. She was also approved to receive Breztri thru [place] [employer] [place]. Patient will need Rx sent to [facility]. Forwarded request to PCP.

## 2023-03-16 NOTE — Telephone Encounter (Signed)
Patient was approved for medication assistance program for Breztri thru 09/22/2023. Requested Rx from PCP and Alyssa sent to [facility]. Notified patient that she had been approved for [facility] medication assistance program. She should received a delivery in the next 7 to 10 business days.

## 2023-03-16 NOTE — Telephone Encounter (Signed)
-----   Message from Henrene Pastor, RPH-CPP sent at 03/13/2023  2:41 PM EDT ----- Please start application for Inokana. You can also start [place]. I am waiting on PCP to approve change to [facility]. Patient's states there are 6 people in household.

## 2023-03-16 NOTE — Telephone Encounter (Signed)
Received notification from [place] regarding approval for BREZTRI Patient assistance APPROVED from 03/16/2023 to 09/22/2023.  Phone: [phone]  PLEASE BE ADVISED PROVIDER WILL HAVE TO SEND IN NEW SCRIPT THOUGH [facility] FOR [place]  Melanee Spry CPhT Rx [facility] [phone]

## 2023-03-16 NOTE — Addendum Note (Signed)
Addended by: Henrene Pastor B on: 03/16/2023 01:51 PM   Modules accepted: Orders

## 2023-03-16 NOTE — Telephone Encounter (Signed)
PAP application for [facility] FROM Eliberto Ivory has been mailed to pt home. I will fax PCP pages once I receive pt pages   PLEASE BE ADVISED  Melanee Spry CPhT Rx [facility] [phone]

## 2023-03-17 ENCOUNTER — Ambulatory Visit: Payer: Medicare HMO | Admitting: Physician Assistant

## 2023-03-25 ENCOUNTER — Encounter: Payer: Self-pay | Admitting: Physician Assistant

## 2023-03-25 ENCOUNTER — Ambulatory Visit (INDEPENDENT_AMBULATORY_CARE_PROVIDER_SITE_OTHER): Payer: Medicare HMO | Admitting: Physician Assistant

## 2023-03-25 VITALS — BP 110/62 | HR 84 | Temp 97.3°F | Ht 65.0 in | Wt 294.4 lb

## 2023-03-25 DIAGNOSIS — J4541 Moderate persistent asthma with (acute) exacerbation: Secondary | ICD-10-CM | POA: Diagnosis not present

## 2023-03-25 DIAGNOSIS — R0602 Shortness of breath: Secondary | ICD-10-CM

## 2023-03-25 DIAGNOSIS — J449 Chronic obstructive pulmonary disease, unspecified: Secondary | ICD-10-CM | POA: Diagnosis not present

## 2023-03-25 DIAGNOSIS — M25511 Pain in right shoulder: Secondary | ICD-10-CM | POA: Diagnosis not present

## 2023-03-25 MED ORDER — PREDNISONE 20 MG PO TABS
ORAL_TABLET | ORAL | 0 refills | Status: DC
Start: 2023-03-25 — End: 2023-04-10

## 2023-03-25 NOTE — Progress Notes (Signed)
Subjective:    Patient ID: [name] [name], female    DOB: 25-Oct-1950, 72 y.o.   MRN: [id number]  Chief Complaint  Patient presents with   Shoulder Pain    Pt in office for right shoulder pain from 3 weeks ago but pain is getting better. Pt thinks she pulled a muscle in her pool; pt also still c/o of wheezing and SOB still and not getting better;     HPI Patient is in today for right shoulder pain. Here with husband today. Pool about 3 weeks ago, twisted her body strangely, felt like she pulled muscles. Took some methocarbamol from her husband a few days and this did help. Couldn't sleep on it at first. Doing better now. Full ROM.   Main concern now is her breathing.  Can't walk from living to across the hall, she's wheezing and can't breathe. Trying to catch her breath, normal with COPD / asthma flares. Using rescue inhaler. Still using Symbicort, waiting on Breztri to come in and replace this medication. Thick clear mucous with her cough. No chest pain. No headaches or dizziness. Hasn't seen a pulmonologist in over two years.    Past Medical History:  Diagnosis Date   Bronchitis, asthmatic    Coronary artery disease    BMS to LAD 02/2008   Diabetes mellitus without complication (HCC)    Hyperlipidemia    Hypertensive heart disease 04/16/2016   Hypothyroidism    MI, old    Morbid obesity (HCC) 04/16/2016   S/P coronary artery stent placement 04/16/2016   BMS to LAD 2009   Tobacco abuse 04/16/2016    Past Surgical History:  Procedure Laterality Date   ABDOMINAL HYSTERECTOMY     35; very heavy bleeding   ANKLE SURGERY Right    CHOLECYSTECTOMY     CORONARY ANGIOPLASTY  02/2008   bare metal stent to LAD    CORONARY STENT PLACEMENT     KNEE ARTHROSCOPY     OOPHORECTOMY Left    age 31   ROTATOR CUFF REPAIR Bilateral    THORACIC OUTLET SURGERY     WRIST SURGERY      Family History  Problem Relation Age of Onset   Cancer Mother    Diabetes Son    Hypertension Son      Social History   Tobacco Use   Smoking status: Light Smoker    Packs/day: 0.25    Years: 31.00    Additional pack years: 0.00    Total pack years: 7.75    Types: Cigarettes   Smokeless tobacco: Never  Vaping Use   Vaping Use: Never used  Substance Use Topics   Alcohol use: No   Drug use: No     Allergies  Allergen Reactions   Clindamycin/Lincomycin Other (See Comments)    Head ache    Codeine Hives   Cortisone    Dilaudid [Hydromorphone Hcl] Nausea And Vomiting   Iodides    Reglan [Metoclopramide] Other (See Comments)    Other reaction(s): Dystonia Hallucinations/Violent   Tramadol    Trulicity [Dulaglutide] Other (See Comments)    Severe constipation   Tape Rash    Blisters     Review of Systems NEGATIVE UNLESS OTHERWISE INDICATED IN HPI      Objective:     BP 110/62 (BP Location: Left Arm)   Pulse 84   Temp (!) 97.3 F (36.3 C) (Temporal)   Ht 5\' 5"  (1.651 m)   Wt 294 lb 6.4 oz (133.5  kg)   LMP  (LMP Unknown)   SpO2 94%   BMI 48.99 kg/m   Wt Readings from Last 3 Encounters:  03/25/23 294 lb 6.4 oz (133.5 kg)  02/20/23 295 lb (133.8 kg)  02/09/23 293 lb (132.9 kg)    BP Readings from Last 3 Encounters:  03/25/23 110/62  02/20/23 116/64  02/03/23 122/66     Physical Exam Vitals and nursing note reviewed.  Constitutional:      Appearance: Normal appearance. She is obese.  Eyes:     Extraocular Movements: Extraocular movements intact.     Conjunctiva/sclera: Conjunctivae normal.     Pupils: Pupils are equal, round, and reactive to light.  Cardiovascular:     Rate and Rhythm: Normal rate and regular rhythm.     Pulses: Normal pulses.     Heart sounds: No murmur heard. Pulmonary:     Effort: Pulmonary effort is normal. No respiratory distress.     Breath sounds: Wheezing and rhonchi present.  Musculoskeletal:     Right shoulder: No tenderness, bony tenderness or crepitus. Normal range of motion. Normal strength.  Neurological:      General: No focal deficit present.     Mental Status: She is alert and oriented to person, place, and time.  Psychiatric:        Mood and Affect: Mood normal.        Assessment & Plan:  COPD GOLD 0/ prob AB still smoking  Assessment & Plan: Using her Symbicort, will switch to [place] once this is mailed to her. Keep using rescue inhaler four times daily. Add prednisone course - need to monitor glucose levels. Referral to pulmonology as symptoms are recurrent, more persistent lately   Orders: -     predniSONE; Take one tab once daily with breakfast x 5 days, then 1/2 tab once daily x 5 days  Dispense: 10 tablet; Refill: 0 -     Ambulatory referral to Pulmonology  Shortness of breath -     predniSONE; Take one tab once daily with breakfast x 5 days, then 1/2 tab once daily x 5 days  Dispense: 10 tablet; Refill: 0 -     Ambulatory referral to Pulmonology  Moderate persistent asthmatic bronchitis with acute exacerbation -     predniSONE; Take one tab once daily with breakfast x 5 days, then 1/2 tab once daily x 5 days  Dispense: 10 tablet; Refill: 0 -     Ambulatory referral to Pulmonology  Acute pain of right shoulder  --Shoulder pain is much better, near resolved. Monitor at this time, gentle stretches.     Return if symptoms worsen or fail to improve.    Shulamis Wenberg M Lael Pilch, PA-C

## 2023-03-25 NOTE — Assessment & Plan Note (Signed)
Using her Symbicort, will switch to [place] once this is mailed to her. Keep using rescue inhaler four times daily. Add prednisone course - need to monitor glucose levels. Referral to pulmonology as symptoms are recurrent, more persistent lately

## 2023-04-02 ENCOUNTER — Other Ambulatory Visit: Payer: Self-pay | Admitting: Physician Assistant

## 2023-04-02 ENCOUNTER — Telehealth: Payer: Self-pay | Admitting: Physician Assistant

## 2023-04-02 DIAGNOSIS — J4541 Moderate persistent asthma with (acute) exacerbation: Secondary | ICD-10-CM

## 2023-04-02 NOTE — Telephone Encounter (Signed)
Refill sent to pharmacy for patient.

## 2023-04-02 NOTE — Telephone Encounter (Signed)
Prescription Request  04/02/2023  LOV: 03/25/2023  What is the name of the medication or equipment? ipratropium (ATROVENT) 0.06 % nasal spray   Have you contacted your pharmacy to request a refill? Yes   Which pharmacy would you like this sent to?  [id number] [place], [place] - [phone] AT [facility] OF [facility] & [address] [place] [address] Phone: [phone] Fax: [phone]    Patient notified that their request is being sent to the clinical staff for review and that they should receive a response within 2 business days.   Please advise at Mobile [phone] (mobile)

## 2023-04-06 ENCOUNTER — Telehealth: Payer: Self-pay

## 2023-04-06 NOTE — Telephone Encounter (Signed)
Patient called in to say that she's stopping the mucinex because she's feeling 90% better.   Advised patient to drink more water as she says she's only drinking 1 bottle

## 2023-04-10 ENCOUNTER — Other Ambulatory Visit: Payer: Medicare HMO | Admitting: Pharmacist

## 2023-04-10 DIAGNOSIS — E119 Type 2 diabetes mellitus without complications: Secondary | ICD-10-CM

## 2023-04-10 DIAGNOSIS — E1165 Type 2 diabetes mellitus with hyperglycemia: Secondary | ICD-10-CM

## 2023-04-10 DIAGNOSIS — J449 Chronic obstructive pulmonary disease, unspecified: Secondary | ICD-10-CM

## 2023-04-10 NOTE — Progress Notes (Signed)
04/10/2023 Name: SHANDORA KOOGLER MRN: [id number] DOB: Jun 18, 1951  No chief complaint on file.   Arhianna CHLOE MIYOSHI is a 72 y.o. year old female who presented for a telephone visit.   They were referred to the pharmacist by their PCP for assistance in managing medication access, complex medication management, and Chronic Care Management  .   Subjective:  Care Team: Primary Care Provider: Allwardt, Crist Infante, PA-C ; Next Scheduled Visit: 05/06/2023 Podiatrist - next visit 04/20/2023 Cardiologist: Dr Duke Salvia [facility]; Next Scheduled Visit: recall planned for 01/2024  Medication Access/Adherence  Current Pharmacy:  [facility] #[id number] [place], [place] - [address] AT [facility] OF [facility] & [address] [place] [address] [place] [address] Phone: [phone] Fax: [phone]  [facility] - [place], [place] - [address] [place] [address] Phone: [phone] Fax: [phone]  [facility] - [place], [place] - [address] [place] [address] Phone: [phone] Fax: [phone]  [facility] - [place], [place] - [address] [place] [address] Phone: [phone] Fax: [phone]   Patient reports affordability concerns with their medications: Yes  - She had Medicare LIS / Extra Help thru 01/20/2023. Patient received a letter from [employer] stating that she was no longer eligible for LIS after addition of her husband's VA benefits to monthly income.   Applied for [place] to get Markus Daft - patient was approved thru 09/22/2023 and has received her first delivery.  In process of appying for Invokana medication assistance program - patient completed her portion of application and mailed back to Med Assist Team Linward Foster) this morning  Patient reports access/transportation concerns to their pharmacy: No   Patient reports adherence  concerns with their medications:  No     Diabetes:  Current medications: Invokana 100mg  daily (hasn't started yet - she is taking 0.5 tablet of 300mg  to = 150mg  daily) ; glipizide ER 5mg  daily   Medications tried in the past: metformin - stopped due to decreased in renal function; Ozempic - caused severe constipation; Tradgenta 5mg  - stopped due to cost;   Current glucose readings: 102 this morning; last wee her blood pressure was high > 300 once but she was taking prednisone for COPD exacerbation. Blood glucose has returned to normal - usually 100 to 170.  testing 1 to 2 times daily   Patient denies hypoglycemic s/sx including no dizziness, shakiness, sweating. Patient denies hyperglycemic symptoms including no polyuria, polydipsia, polyphagia, nocturia, neuropathy, blurred vision.  Current physical activity: walking in pool some but not much due to recent COPD exacerbation   Hyperlipidemia/ASCVD Risk Reduction  Current lipid lowering medications: atorvastatin 10mg  daily   Antiplatelet regimen: aspirin 81mg  daily   ASCVD History: CAD  Risk Factors: Type 2 DM; age   COPD:  Current medications: Breztri 2 puffs twice a day (started around 04/03/2023)  Reports 3 exacerbations in the past year (took round of prednisone 03/25/2023; 12/2022 and 07/2022)  Patient states breathing has improved since most recent exacerbation. Not sure if due to prednisone 20mg  burst or change from Symbicort to [place].  She also has been taking over-the-counter Mucinex daily and feels this has been beneficial for thinning mucus.     Objective:  Lab Results  Component Value Date   HGBA1C 7.3 (A) 01/02/2023    Lab Results  Component Value  Date   CREATININE 1.46 (H) 01/02/2023   BUN 22 01/02/2023   NA 140 01/02/2023   K 4.7 01/02/2023   CL 101 01/02/2023   CO2 31 01/02/2023    Lab Results  Component Value Date   CHOL 122 06/12/2022   HDL 41.30 06/12/2022   LDLCALC 41 06/12/2022    LDLDIRECT 60.0 05/16/2019   TRIG 194.0 (H) 06/12/2022   CHOLHDL 3 06/12/2022    Medications Reviewed Today     Reviewed by Henrene Pastor, RPH-CPP (Pharmacist) on 04/10/23 at 1147  Med List Status: <None>   Medication Order Taking? Sig Documenting Provider Last Dose Status Informant  Accu-Chek FastClix Lancets MISC [id number]  USE TO TEST UP TO FOUR TIMES DAILY AS DIRECTED Orland Mustard, MD  Active   ACCU-CHEK GUIDE test strip [id number]  USE AS DIRECTED Allwardt, Crist Infante, PA-C  Active   albuterol (ACCUNEB) 1.25 MG/3ML nebulizer solution [id number] Yes TAKE 3 MLS BY NEBULIZATION THREE TIMES DAILY AS NEEDED FOR WHEEZING Allwardt, Alyssa M, PA-C Taking Active   albuterol (VENTOLIN HFA) 108 (90 Base) MCG/ACT inhaler [id number] Yes INHALE 2 PUFFS INTO THE LUNGS EVERY 6 HOURS AS NEEDED FOR WHEEZING OR SHORTNESS OF BREATH Allwardt, Alyssa M, PA-C Taking Active   ALPRAZolam (XANAX) 0.5 MG tablet [id number] Yes TAKE 1 TABLET(0.5 MG) BY MOUTH TWICE DAILY AS NEEDED FOR ANXIETY Allwardt, Alyssa M, PA-C Taking Active   aspirin 81 MG tablet [id number] Yes Take 81 mg by mouth daily. [provider] Taking Active Self  atorvastatin (LIPITOR) 10 MG tablet [id number] Yes TAKE 1 TABLET(10 MG) BY MOUTH DAILY Chilton Si, MD Taking Active   blood glucose meter kit and supplies KIT [id number] Yes Dispense based on patient and insurance preference. Use up to four times daily as directed. DX E11.8 Allwardt, Crist Infante, PA-C Taking Active   Budeson-Glycopyrrol-Formoterol (BREZTRI AEROSPHERE) 160-9-4.8 MCG/ACT AERO [id number] Yes Inhale 2 puffs into the lungs 2 (two) times daily. Allwardt, Crist Infante, PA-C Taking Active            Med Note Haze Justin   Fri Apr 10, 2023 11:46 AM) [place] and [place] Program thru 09/22/2023  Calcium Carb-Cholecalciferol (CALCIUM 500 + D3 PO) [id number] Yes Take by mouth. [provider] Taking Active   canagliflozin (INVOKANA) 100 MG TABS tablet [id number] Yes Take 1 tablet (100 mg  total) by mouth daily before breakfast. Allwardt, Crist Infante, PA-C Taking Active            Med Note Clydie Braun, Adison Jerger B   Fri Mar 13, 2023  2:11 PM) Taking 0.5 tab of 300mg  = 150mg  daily until finishes current prescripton  furosemide (LASIX) 20 MG tablet [id number] Yes Taking 3 times a week  Patient taking differently: Taking 1 times a week   Allwardt, Alyssa M, PA-C Taking Active   glipiZIDE (GLUCOTROL XL) 5 MG 24 hr tablet [id number] Yes Take 1 tablet (5 mg total) by mouth daily with breakfast. Allwardt, Crist Infante, PA-C Taking Active   ipratropium (ATROVENT) 0.06 % nasal spray [id number] Yes USE 2 SPRAYS IN EACH NOSTRIL FOUR TIMES DAILY AS DIRECTED Allwardt, Alyssa M, PA-C Taking Active   ipratropium-albuterol (DUONEB) 0.5-2.5 (3) MG/3ML SOLN [id number]  USE 3 ML VIA NEBULIZER EVERY 6 HOURS AS NEEDED Allwardt, Alyssa M, PA-C  Active   isosorbide mononitrate (IMDUR) 60 MG 24 hr tablet [id number] Yes TAKE 1 TABLET(60 MG) BY MOUTH DAILY Chilton Si, MD Taking Active   levothyroxine (SYNTHROID) 100 MCG tablet [id number] Yes TAKE 1 TABLET(100  MCG) BY MOUTH DAILY Allwardt, Alyssa M, PA-C Taking Active   meloxicam (MOBIC) 7.5 MG tablet [id number]  Take 1 tablet (7.5 mg total) by mouth daily. Allwardt, Crist Infante, PA-C  Active   Menthol, Topical Analgesic, (BIOFREEZE) 4 % GEL [id number]  Apply topically. [provider]  Active   Olopatadine HCl 0.2 % SOLN [id number]  Apply 1 drop to eye 2 (two) times daily. Allwardt, Crist Infante, PA-C  Active   pantoprazole (PROTONIX) 40 MG tablet [id number] Yes TAKE 1 TABLET(40 MG) BY MOUTH DAILY Allwardt, Alyssa M, PA-C Taking Active   vitamin B-12 (CYANOCOBALAMIN) 1000 MCG tablet [id number] Yes Take 1,000 mcg by mouth daily. [provider] Taking Active               Assessment/Plan:   Diabetes: blood glucose improving. Last A1c was 7.3.  - Reviewed goal A1c, goal fasting, and goal 2 hour post prandial glucose - Reviewed lifestyle modifications  including: exercise - encouraged to continue pool exercise.  - Continue Invokana and glipizide - Recommend to check glucose 1 to 2 times per day - COordinating with Med Assist team to apply for medication assistance program for Invokana.  Patient has returned application in mail today.  Hyperlipidemia/ASCVD Risk Reduction: LDL at goal of < 55; Triglycerides slightly above goal of < 150. - Recommend to continue atorvastatin 10mg  daily.  COPD: 2 exacerbations in the last year.  - Continue Breztri - inhale 2 puffs into lungs twice a day - Continue Mucinex up to 1200mg  twice a day  Follow Up Plan: 6 to 8 weeks   Henrene Pastor, PharmD Clinical Pharmacist [facility]

## 2023-04-16 NOTE — Telephone Encounter (Signed)
RECEIVED PT PAGES OF Application for [facility] to [facility] AND FAXED PROVIDER PAGES TO PROVIDER OFFICE OF ALLWARDT, ALYSSA   PLEASE BE ADVISED

## 2023-04-20 ENCOUNTER — Other Ambulatory Visit: Payer: Self-pay | Admitting: Physician Assistant

## 2023-04-20 ENCOUNTER — Ambulatory Visit: Payer: Medicare HMO | Admitting: Podiatry

## 2023-04-20 ENCOUNTER — Encounter: Payer: Self-pay | Admitting: Podiatry

## 2023-04-20 VITALS — BP 152/86 | HR 90

## 2023-04-20 DIAGNOSIS — E1165 Type 2 diabetes mellitus with hyperglycemia: Secondary | ICD-10-CM | POA: Diagnosis not present

## 2023-04-20 DIAGNOSIS — B351 Tinea unguium: Secondary | ICD-10-CM | POA: Diagnosis not present

## 2023-04-20 DIAGNOSIS — M79675 Pain in left toe(s): Secondary | ICD-10-CM | POA: Diagnosis not present

## 2023-04-20 DIAGNOSIS — M79674 Pain in right toe(s): Secondary | ICD-10-CM | POA: Diagnosis not present

## 2023-04-20 NOTE — Progress Notes (Signed)
This patient returns to my office for at risk foot care.  This patient requires this care by a professional since this patient will be at risk due to having diabetes. This patient is unable to cut nails himself since the patient cannot reach his nails.These nails are painful walking and wearing shoes.  This patient presents for at risk foot care today.  General Appearance  Alert, conversant and in no acute stress.  Vascular  Dorsalis pedis and posterior tibial  pulses are palpable  bilaterally.  Capillary return is within normal limits  bilaterally. Temperature is within normal limits  bilaterally.  Neurologic  Senn-Weinstein monofilament wire test within normal limits  bilaterally. Muscle power within normal limits bilaterally.  Nails Thick disfigured discolored nails with subungual debris  from hallux to fifth toes bilaterally. No evidence of bacterial infection or drainage bilaterally. Painful pincer nail left hallux.  Orthopedic  No limitations of motion  feet .  No crepitus or effusions noted.  No bony pathology or digital deformities noted.  Skin  normotropic skin with no porokeratosis noted bilaterally.  No signs of infections or ulcers noted.     Onychomycosis  Pain in right toes  Pain in left toes  Consent was obtained for treatment procedures.   Mechanical debridement of nails 1-5  bilaterally performed with a nail nipper.  Filed with dremel without incident. Patient has pain out of proportion therefore I told her she could have surgery to reduce her toenail pain.   Return office visit   3 months                   Told patient to return for periodic foot care and evaluation due to potential at risk complications.   Helane Gunther DPM

## 2023-04-21 ENCOUNTER — Ambulatory Visit (INDEPENDENT_AMBULATORY_CARE_PROVIDER_SITE_OTHER): Payer: Medicare HMO | Admitting: *Deleted

## 2023-04-21 DIAGNOSIS — J449 Chronic obstructive pulmonary disease, unspecified: Secondary | ICD-10-CM

## 2023-04-21 DIAGNOSIS — E1165 Type 2 diabetes mellitus with hyperglycemia: Secondary | ICD-10-CM

## 2023-04-21 NOTE — Patient Instructions (Signed)
Please call the care guide team at [phone] if you need to cancel or reschedule your appointment.   If you are experiencing a Mental Health or Behavioral Health Crisis or need someone to talk to, please call the Suicide and Crisis Lifeline: 988 call the [place] National Suicide Prevention Lifeline: 615-634-6594 or TTY: (276)786-5163 TTY 276-022-1314) to talk to a trained counselor call 1-800-273-TALK (toll free, 24 hour hotline) go to [facility] [address] [place] [phone]) call 911   Following is a copy of the CCM Program Consent:  CCM service includes personalized support from designated clinical staff supervised by the physician, including individualized plan of care and coordination with other care providers 24/7 contact phone numbers for assistance for urgent and routine care needs. Service will only be billed when office clinical staff spend 20 minutes or more in a month to coordinate care. Only one practitioner may furnish and bill the service in a calendar month. The patient may stop CCM services at amy time (effective at the end of the month) by phone call to the office staff. The patient will be responsible for cost sharing (co-pay) or up to 20% of the service fee (after annual deductible is met)  Following is a copy of your full provider care plan:   Goals Addressed             This Visit's Progress    CCM (COPD) EXPECTED OUTCOME: MONITOR, SELF-MANAGE AND REDUCE SYMPTOMS OF COPD       Current Barriers:  Knowledge Deficits related to COPD management Care Coordination needs related to pharmacy needs/ medication management in a patient with COPD Chronic Disease Management support and education needs related to COPD No Advanced Directives in place- pt declines information Patient reports she lives with spouse, daughter and son in law, is independent with all aspects of her care, reports she has all medications including  inhalers and taking as presribed Patient reports she smokes approximately 1/2 ppd cigarettes, not interested in smoking cessation at present  Planned Interventions: Provided instruction about proper use of medications used for management of COPD including inhalers Advised patient to self assesses COPD action plan zone and make appointment with provider if in the yellow zone for 48 hours without improvement Advised patient to engage in light exercise as tolerated 3-5 days a week to aid in the the management of COPD Provided education about and advised patient to utilize infection prevention strategies to reduce risk of respiratory infection Reinforced importance of smoking cessation  Symptom Management: Take medications as prescribed   Attend all scheduled provider appointments Call pharmacy for medication refills 3-7 days in advance of running out of medications Attend church or other social activities Perform all self care activities independently  Perform IADL's (shopping, preparing meals, housekeeping, managing finances) independently Call provider office for new concerns or questions  eliminate smoking in my home identify and remove indoor air pollutants listen for public air quality announcements every day do breathing exercises every day develop a rescue plan follow rescue plan if symptoms flare-up eat healthy/prescribed diet: heart healthy, carbohydrate modified get at least 7 to 8 hours of sleep at night do breathing exercises every day Follow COPD action plan Please consider smoking cessation  Follow Up Plan: Telephone follow up appointment with care management team member scheduled for:   06/10/23 at 1045 am        CCM (DIABETES) EXPECTED OUTCOME: MONITOR, SELF-MANAGE AND REDUCE SYMPTOMS OF DIABETES  Current Barriers:  Knowledge Deficits related to Diabetes management Chronic Disease Management support and education needs related to Diabetes, diet Patient reports  she checks CBG TID, fasting readings have been elevated over the past week with fasting readings 187-197, random readings near 200, pt states she is off prednisone now and not sure why CBG has been slightly elevated, denies any infection, if continues to be elevated will contact primary care provider Patient reports she tries to follow a special diet but loves to eat potato chips  Planned Interventions: Reviewed medications with patient and discussed importance of medication adherence;        Counseled on importance of regular laboratory monitoring as prescribed;        Advised patient, providing education and rationale, to check cbg three times daily and record        call provider for findings outside established parameters;       Review of patient status, including review of consultants reports, relevant laboratory and other test results, and medications completed;       Reinforced carbohydrate modified diet Reviewed signs/ symptoms of infection  Symptom Management: Take medications as prescribed   Attend all scheduled provider appointments Call pharmacy for medication refills 3-7 days in advance of running out of medications Attend church or other social activities Perform all self care activities independently  Perform IADL's (shopping, preparing meals, housekeeping, managing finances) independently Call provider office for new concerns or questions  check blood sugar at prescribed times: three times daily check feet daily for cuts, sores or redness enter blood sugar readings and medication or insulin into daily log take the blood sugar log to all doctor visits take the blood sugar meter to all doctor visits set goal weight trim toenails straight across fill half of plate with vegetables limit fast food meals to no more than 1 per week manage portion size prepare main meal at home 3 to 5 days each week read food labels for fat, fiber, carbohydrates and portion size set a  realistic goal keep feet up while sitting wash and dry feet carefully every day wear comfortable, cotton socks wear comfortable, well-fitting shoes Try to limit/ avoid concentrated sweets and processed foods   Follow Up Plan: Telephone follow up appointment with care management team member scheduled for:  06/10/23 at 1045 am          The patient verbalized understanding of instructions, educational materials, and care plan provided today and DECLINED offer to receive copy of patient instructions, educational materials, and care plan.  Telephone follow up appointment with care management team member scheduled for:  06/10/23 at 1045 am

## 2023-04-21 NOTE — Chronic Care Management (AMB) (Signed)
Chronic Care Management   CCM RN Visit Note  04/21/2023 Name: [name] [name] MRN: [id number] DOB: Sep 23, 1950  Subjective: [name] [name] is a 72 y.o. year old female who is a primary care patient of Allwardt, Alyssa M, PA-C. The patient was referred to the Chronic Care Management team for assistance with care management needs subsequent to provider initiation of CCM services and plan of care.    Today's Visit:  Engaged with patient by telephone for follow up visit.        Goals Addressed             This Visit's Progress    CCM (COPD) EXPECTED OUTCOME: MONITOR, SELF-MANAGE AND REDUCE SYMPTOMS OF COPD       Current Barriers:  Knowledge Deficits related to COPD management Care Coordination needs related to pharmacy needs/ medication management in a patient with COPD Chronic Disease Management support and education needs related to COPD No Advanced Directives in place- pt declines information Patient reports she lives with spouse, daughter and son in law, is independent with all aspects of her care, reports she has all medications including inhalers and taking as presribed Patient reports she smokes approximately 1/2 ppd cigarettes, not interested in smoking cessation at present  Planned Interventions: Provided instruction about proper use of medications used for management of COPD including inhalers Advised patient to self assesses COPD action plan zone and make appointment with provider if in the yellow zone for 48 hours without improvement Advised patient to engage in light exercise as tolerated 3-5 days a week to aid in the the management of COPD Provided education about and advised patient to utilize infection prevention strategies to reduce risk of respiratory infection Reinforced importance of smoking cessation  Symptom Management: Take medications as prescribed   Attend all scheduled provider appointments Call pharmacy for medication refills 3-7 days in advance of running out of  medications Attend church or other social activities Perform all self care activities independently  Perform IADL's (shopping, preparing meals, housekeeping, managing finances) independently Call provider office for new concerns or questions  eliminate smoking in my home identify and remove indoor air pollutants listen for public air quality announcements every day do breathing exercises every day develop a rescue plan follow rescue plan if symptoms flare-up eat healthy/prescribed diet: heart healthy, carbohydrate modified get at least 7 to 8 hours of sleep at night do breathing exercises every day Follow COPD action plan Please consider smoking cessation  Follow Up Plan: Telephone follow up appointment with care management team member scheduled for:   06/10/23 at 1045 am        CCM (DIABETES) EXPECTED OUTCOME: MONITOR, SELF-MANAGE AND REDUCE SYMPTOMS OF DIABETES       Current Barriers:  Knowledge Deficits related to Diabetes management Chronic Disease Management support and education needs related to Diabetes, diet Patient reports she checks CBG TID, fasting readings have been elevated over the past week with fasting readings 187-197, random readings near 200, pt states she is off prednisone now and not sure why CBG has been slightly elevated, denies any infection, if continues to be elevated will contact primary care provider Patient reports she tries to follow a special diet but loves to eat potato chips  Planned Interventions: Reviewed medications with patient and discussed importance of medication adherence;        Counseled on importance of regular laboratory monitoring as prescribed;        Advised patient, providing education and rationale, to check cbg  three times daily and record        call provider for findings outside established parameters;       Review of patient status, including review of consultants reports, relevant laboratory and other test results, and medications  completed;       Reinforced carbohydrate modified diet Reviewed signs/ symptoms of infection  Symptom Management: Take medications as prescribed   Attend all scheduled provider appointments Call pharmacy for medication refills 3-7 days in advance of running out of medications Attend church or other social activities Perform all self care activities independently  Perform IADL's (shopping, preparing meals, housekeeping, managing finances) independently Call provider office for new concerns or questions  check blood sugar at prescribed times: three times daily check feet daily for cuts, sores or redness enter blood sugar readings and medication or insulin into daily log take the blood sugar log to all doctor visits take the blood sugar meter to all doctor visits set goal weight trim toenails straight across fill half of plate with vegetables limit fast food meals to no more than 1 per week manage portion size prepare main meal at home 3 to 5 days each week read food labels for fat, fiber, carbohydrates and portion size set a realistic goal keep feet up while sitting wash and dry feet carefully every day wear comfortable, cotton socks wear comfortable, well-fitting shoes Try to limit/ avoid concentrated sweets and processed foods   Follow Up Plan: Telephone follow up appointment with care management team member scheduled for:  06/10/23 at 1045 am          Plan:Telephone follow up appointment with care management team member scheduled for:  06/10/23 at 1045 am  Irving Shows [facility], BSN RN Case Manager [facility] [phone]

## 2023-04-22 DIAGNOSIS — J449 Chronic obstructive pulmonary disease, unspecified: Secondary | ICD-10-CM | POA: Diagnosis not present

## 2023-04-22 DIAGNOSIS — Z7984 Long term (current) use of oral hypoglycemic drugs: Secondary | ICD-10-CM

## 2023-04-22 DIAGNOSIS — F17210 Nicotine dependence, cigarettes, uncomplicated: Secondary | ICD-10-CM

## 2023-04-22 DIAGNOSIS — E1159 Type 2 diabetes mellitus with other circulatory complications: Secondary | ICD-10-CM

## 2023-04-22 NOTE — Telephone Encounter (Signed)
Medication: Pantoprazole (Protonix) 40 mg  Directions: Take 1 tablet by mouth daily  Last given:  10/21/22 Number refills: 1 Last o/v: 03/25/23 Follow up: 05/06/23

## 2023-04-23 ENCOUNTER — Other Ambulatory Visit: Payer: Self-pay | Admitting: Physician Assistant

## 2023-04-23 DIAGNOSIS — E1165 Type 2 diabetes mellitus with hyperglycemia: Secondary | ICD-10-CM

## 2023-04-23 DIAGNOSIS — J4541 Moderate persistent asthma with (acute) exacerbation: Secondary | ICD-10-CM

## 2023-04-23 NOTE — Telephone Encounter (Signed)
The original prescription was reordered on 02/20/2023 by Henrene Pastor, RPH-CPP. Renewing this prescription may not be appropriate. Please advise

## 2023-04-23 NOTE — Telephone Encounter (Signed)
RECEIVED BOTH PT AND PROVIDER PAGES. Submitted application for [facility] to [facility] for patient assistance PROCESSING.   Phone: [phone]  PLEASE BE ADVISED

## 2023-05-01 NOTE — Telephone Encounter (Signed)
PAP: Patient assistance application for [facility]  has been approved by PAP Companies: [employer] from 04/28/2023 to FOR UP TO 1 YEAR. Medication should be delivered to PAP Delivery: Provider's office For further shipping updates, please contact [employer] [phone] Pt ID is: NO ID   PLEASE BE ADVISED LETTER OF APPROVAL IS SCANNED IN MEDIA.   Georga Bora [facility] Patient Advocate [phone]

## 2023-05-06 ENCOUNTER — Ambulatory Visit: Payer: Medicare HMO | Admitting: Physician Assistant

## 2023-05-22 ENCOUNTER — Other Ambulatory Visit: Payer: Medicare HMO | Admitting: Pharmacist

## 2023-05-22 NOTE — Progress Notes (Signed)
05/22/2023 Name: [name] [name] MRN: [id number] DOB: [birth year]  Chief Complaint  Patient presents with   Medication Management   Diabetes    [name] [name] is a 72 y.o. year old female who presented for a telephone visit.   They were referred to the pharmacist by their PCP for assistance in managing medication access, complex medication management, and diabetes  Subjective:  Care Team: Primary Care Provider: Allwardt, Crist Infante, PA-C ; Next Scheduled Visit: 05/29/2023 Podiatrist - next visit 07/21/2023 Cardiologist: Dr Duke [facility]; Next Scheduled Visit: recall planned for 01/2024 Pulmonologist - Dr Wynona Neat; Next Scheduled Visit: 07/08/2023  Medication Access/Adherence  Current Pharmacy:  [id number] [place] Otto,  - [address] AT [facility] [address] [place] [address] Phone: [phone] Fax: [phone]  [facility] - [place], [place] - [address] [place] [address] Phone: [phone] Fax: [phone]  [facility] - [place], [place] - [address] [place] [address] Phone: [phone] Fax: [phone]  [facility] - [place], [place] - [address] [place] [address] [place] [address] Phone: [phone] Fax: [phone]   Patient reports affordability concerns with their medications: Yes  - She had Medicare LIS / Extra Help thru 01/20/2023. Patient received a letter from [employer] stating that she was no longer eligible for LIS after addition of her husband's VA benefits to monthly income.   Applied for [place] and [place] to get Markus Daft - patient was approved thru 09/22/2023 and has received her first delivery.  Applied for [employer] medication assistance program for Theodis Sato - was approved 04/28/2023 but patient states she has not received her first delivery.   Patient reports  access/transportation concerns to their pharmacy: No   Patient reports adherence concerns with their medications:  No     Diabetes:  Current medications: Invokana 100mg  daily (hasn't started yet - she is taking 0.5 tablet of 300mg  to = 150mg  daily) ; glipizide ER 5mg  daily   Medications tried in the past: metformin - stopped due to decreased in renal function; Ozempic - caused severe constipation; Tradgenta 5mg  - stopped due to cost;   Current glucose readings: 130's and 140's testing 1 to 2 times daily   Patient denies hypoglycemic s/sx including no dizziness, shakiness, sweating. Patient denies hyperglycemic symptoms including no polyuria, polydipsia, polyphagia, nocturia, neuropathy, blurred vision.  Current physical activity: walking in pool    Hyperlipidemia/ASCVD Risk Reduction  Current lipid lowering medications: atorvastatin 10mg  daily   Antiplatelet regimen: aspirin 81mg  daily   ASCVD History: CAD  Risk Factors: Type 2 DM; age   COPD:  Current medications: Breztri 2 puffs twice a day (started around 04/03/2023) Rescue inhaler use? Uses about 3 times per week. She expected she would have to use albuterol yesterday when she was at [facility] due to higher temperatures recently but she states she did not have to use albuterol at all.    She also has been taking over-the-counter Mucinex daily and feels this has been beneficial for thinning mucus.   Has had 3 exacerbations in the past year (took round of prednisone 03/25/2023; 12/2022 and 07/2022)    Objective:  Lab Results  Component Value Date   HGBA1C 7.3 (A) 01/02/2023    Lab Results  Component Value Date   CREATININE 1.46 (H) 01/02/2023   BUN 22  01/02/2023   NA 140 01/02/2023   K 4.7 01/02/2023   CL 101 01/02/2023   CO2 31 01/02/2023    Lab Results  Component Value Date   CHOL 122 06/12/2022   HDL 41.30 06/12/2022   LDLCALC 41 06/12/2022   LDLDIRECT 60.0 05/16/2019   TRIG 194.0 (H) 06/12/2022    CHOLHDL 3 06/12/2022    Medications Reviewed Today   Medications were not reviewed in this encounter       Assessment/Plan:   Diabetes: blood glucose improving. Last A1c was 7.3.  - Reviewed goal A1c, goal fasting, and goal 2 hour post prandial glucose - Reviewed lifestyle modifications including: exercise - encouraged to continue pool exercise.  - Continue Invokana and glipizide - Recommend to check glucose 1 to 2 times per day - Called [employer] medication assistance program to see if Theodis Sato has been shipped yet but was on hold for > 15 minutes and then call was dropped. Had [employer] call - she was able to speak to a representative who said they tried to contact patient for a "welcome call" before they shipped medication but was unable to reach her. Patient was provided phone number for Linwood Dibbles to call to confirm shipment.   Hyperlipidemia/ASCVD Risk Reduction: LDL at goal of < 55; Triglycerides slightly above goal of < 150. - Recommend to continue atorvastatin 10mg  daily.  COPD: 3 exacerbations in the last year but improved over the last 2 months - Continue Breztri - inhale 2 puffs into lungs twice a day - Continue Mucinex up to 1200mg  twice a day  Follow Up Plan: 6 to 8 weeks to start process for 2025 medication assistance program renewals.    Henrene Pastor, PharmD Clinical Pharmacist [facility]

## 2023-05-28 ENCOUNTER — Telehealth: Payer: Self-pay | Admitting: Physician Assistant

## 2023-05-28 NOTE — Telephone Encounter (Signed)
Patient requests to be called to be rescheduled

## 2023-05-29 ENCOUNTER — Ambulatory Visit (INDEPENDENT_AMBULATORY_CARE_PROVIDER_SITE_OTHER): Payer: Medicare HMO | Admitting: Physician Assistant

## 2023-05-29 VITALS — BP 130/70 | HR 72 | Temp 98.2°F | Ht 65.0 in | Wt 291.6 lb

## 2023-05-29 DIAGNOSIS — Z23 Encounter for immunization: Secondary | ICD-10-CM

## 2023-05-29 DIAGNOSIS — Z7985 Long-term (current) use of injectable non-insulin antidiabetic drugs: Secondary | ICD-10-CM | POA: Diagnosis not present

## 2023-05-29 DIAGNOSIS — J449 Chronic obstructive pulmonary disease, unspecified: Secondary | ICD-10-CM | POA: Diagnosis not present

## 2023-05-29 DIAGNOSIS — Z7984 Long term (current) use of oral hypoglycemic drugs: Secondary | ICD-10-CM | POA: Diagnosis not present

## 2023-05-29 DIAGNOSIS — D485 Neoplasm of uncertain behavior of skin: Secondary | ICD-10-CM

## 2023-05-29 DIAGNOSIS — E1165 Type 2 diabetes mellitus with hyperglycemia: Secondary | ICD-10-CM

## 2023-05-29 LAB — POCT GLYCOSYLATED HEMOGLOBIN (HGB A1C): Hemoglobin A1C: 7.9 % — AB (ref 4.0–5.6)

## 2023-05-29 MED ORDER — REPAGLINIDE 0.5 MG PO TABS
0.5000 mg | ORAL_TABLET | Freq: Three times a day (TID) | ORAL | 2 refills | Status: DC
Start: 2023-05-29 — End: 2023-07-08

## 2023-05-29 NOTE — Assessment & Plan Note (Addendum)
No longer on Tradjenta - too expensive. Invokana 100 mg daily due to renal function Taking Glipizide XL 5 mg  Add Prandin 0.5 mg up to three times daily 30 min prior to meals. CAUTIONED HYPOGLYCCEMIA She could not tolerate metformin nor the GLP-1 injectables.  She may need to consider insulin at some point. A1c stable. Needs to work on diet still.  Lab Results  Component Value Date   HGBA1C 7.9 (A) 05/29/2023   HGBA1C 7.3 (A) 01/02/2023   HGBA1C 7.3 (H) 06/12/2022

## 2023-05-29 NOTE — Progress Notes (Unsigned)
Subjective:    Patient ID: [name] [name], female    DOB: 11-Aug-1951, 72 y.o.   MRN: [id number]  Chief Complaint  Patient presents with   COPD    Pt states that mucinex has been helping a lot    Diabetes    Pt states that she recorded morning fasting sugar at 147 10am ,states that she believes it keeps going up and has been higher,     HPI Patient is in today for recheck on COPD and T2DM.   Past Medical History:  Diagnosis Date   Bronchitis, asthmatic    Coronary artery disease    BMS to LAD 02/2008   Diabetes mellitus without complication (HCC)    Hyperlipidemia    Hypertensive heart disease 04/16/2016   Hypothyroidism    MI, old    Morbid obesity (HCC) 04/16/2016   S/P coronary artery stent placement 04/16/2016   BMS to LAD 2009   Tobacco abuse 04/16/2016    Past Surgical History:  Procedure Laterality Date   ABDOMINAL HYSTERECTOMY     35; very heavy bleeding   ANKLE SURGERY Right    CHOLECYSTECTOMY     CORONARY ANGIOPLASTY  02/2008   bare metal stent to LAD    CORONARY STENT PLACEMENT     KNEE ARTHROSCOPY     OOPHORECTOMY Left    age 5   ROTATOR CUFF REPAIR Bilateral    THORACIC OUTLET SURGERY     WRIST SURGERY      Family History  Problem Relation Age of Onset   Cancer Mother    Diabetes Son    Hypertension Son     Social History   Tobacco Use   Smoking status: Light Smoker    Current packs/day: 0.25    Average packs/day: 0.3 packs/day for 31.0 years (7.8 ttl pk-yrs)    Types: Cigarettes   Smokeless tobacco: Never  Vaping Use   Vaping status: Never Used  Substance Use Topics   Alcohol use: No   Drug use: No     Allergies  Allergen Reactions   Clindamycin/Lincomycin Other (See Comments)    Head ache    Codeine Hives   Cortisone    Dilaudid [Hydromorphone Hcl] Nausea And Vomiting   Iodides    Reglan [Metoclopramide] Other (See Comments)    Other reaction(s): Dystonia Hallucinations/Violent   Tramadol    Trulicity [Dulaglutide] Other (See  Comments)    Severe constipation   Tape Rash    Blisters     Review of Systems NEGATIVE UNLESS OTHERWISE INDICATED IN HPI      Objective:     BP 130/70   Pulse 72   Temp 98.2 F (36.8 C) (Temporal)   Ht 5\' 5"  (1.651 m)   Wt 291 lb 9.6 oz (132.3 kg)   LMP  (LMP Unknown)   SpO2 98%   BMI 48.52 kg/m   Wt Readings from Last 3 Encounters:  05/29/23 291 lb 9.6 oz (132.3 kg)  03/25/23 294 lb 6.4 oz (133.5 kg)  02/20/23 295 lb (133.8 kg)    BP Readings from Last 3 Encounters:  05/29/23 130/70  04/20/23 (!) 152/86  03/25/23 110/62     Physical Exam     Assessment & Plan:  COPD GOLD 0/ prob AB still smoking  Assessment & Plan: Chronic, stable - IMPROVED recently  She has been using Breztri and Mucinex Keep using rescue inhaler up to four times daily. Still recommend pulmonology    Type 2 diabetes  mellitus with hyperglycemia, without long-term current use of insulin (HCC) Assessment & Plan: No longer on Tradjenta - too expensive. Invokana 100 mg daily due to renal function Taking Glipizide XL 5 mg  Add Prandin 0.5 mg up to three times daily 30 min prior to meals. CAUTIONED HYPOGLYCCEMIA She could not tolerate metformin nor the GLP-1 injectables.  She may need to consider insulin at some point. A1c stable. Needs to work on diet still.  Lab Results  Component Value Date   HGBA1C 7.9 (A) 05/29/2023   HGBA1C 7.3 (A) 01/02/2023   HGBA1C 7.3 (H) 06/12/2022     Orders: -     POCT glycosylated hemoglobin (Hb A1C) -     Repaglinide; Take 1 tablet (0.5 mg total) by mouth 3 (three) times daily before meals.  Dispense: 90 tablet; Refill: 2  Neoplasm of uncertain behavior of skin -     Ambulatory referral to Dermatology        Return in about 3 months (around 08/28/2023) for recheck/follow-up.  This note was prepared with assistance of Conservation officer, historic buildings. Occasional wrong-word or sound-a-like substitutions may have occurred due to the inherent  limitations of voice recognition software.  Time Spent: *** minutes of total time was spent on the date of the encounter performing the following actions: chart review prior to seeing the patient, obtaining history, performing a medically necessary exam, counseling on the treatment plan, placing orders, and documenting in our EHR.       Anis Cinelli M Teletha Petrea, PA-C

## 2023-05-29 NOTE — Assessment & Plan Note (Addendum)
Chronic, stable - IMPROVED recently  She has been using Breztri and Mucinex Keep using rescue inhaler up to four times daily. Still recommend pulmonology

## 2023-06-01 ENCOUNTER — Telehealth: Payer: Self-pay

## 2023-06-01 NOTE — Telephone Encounter (Signed)
Called pt to find out where she had a Diabetic Eye screening in the last year so we can request results. LVM with cb number to return call to advise

## 2023-06-01 NOTE — Telephone Encounter (Signed)
Patient called back stating she got diabetic eye screening at [facility] on [address] in March of 2023.

## 2023-06-02 ENCOUNTER — Encounter: Payer: Self-pay | Admitting: Physician Assistant

## 2023-06-02 NOTE — Telephone Encounter (Signed)
Noted; letter for Diabetic Eye Screening results sent

## 2023-06-10 ENCOUNTER — Other Ambulatory Visit: Payer: Medicare HMO | Admitting: *Deleted

## 2023-06-12 ENCOUNTER — Other Ambulatory Visit: Payer: Medicare HMO | Admitting: *Deleted

## 2023-06-12 ENCOUNTER — Encounter: Payer: Self-pay | Admitting: *Deleted

## 2023-06-12 NOTE — Patient Instructions (Signed)
Visit Information  Thank you for taking time to visit with me today. Please don't hesitate to contact me if I can be of assistance to you before our next scheduled telephone appointment.  Following are the goals we discussed today:   Goals Addressed             This Visit's Progress    CCM (COPD) EXPECTED OUTCOME: MONITOR, SELF-MANAGE AND REDUCE SYMPTOMS OF COPD       Current Barriers:  Knowledge Deficits related to COPD management Care Coordination needs related to pharmacy needs/ medication management in a patient with COPD Chronic Disease Management support and education needs related to COPD No Advanced Directives in place- pt declines information Patient reports she lives with spouse, daughter and son in law, is independent with all aspects of her care, reports she has all medications including inhalers and taking as presribed Patient reports she smokes approximately 1/2 ppd cigarettes, not interested in smoking cessation at present, no new concerns  Planned Interventions: Provided instruction about proper use of medications used for management of COPD including inhalers Advised patient to self assesses COPD action plan zone and make appointment with provider if in the yellow zone for 48 hours without improvement Advised patient to engage in light exercise as tolerated 3-5 days a week to aid in the the management of COPD Provided education about and advised patient to utilize infection prevention strategies to reduce risk of respiratory infection Reviewed importance of smoking cessation Reviewed all upcoming scheduled appointments  Symptom Management: Take medications as prescribed   Attend all scheduled provider appointments Call pharmacy for medication refills 3-7 days in advance of running out of medications Attend church or other social activities Perform all self care activities independently  Perform IADL's (shopping, preparing meals, housekeeping, managing finances)  independently Call provider office for new concerns or questions  eliminate smoking in my home identify and remove indoor air pollutants listen for public air quality announcements every day do breathing exercises every day develop a rescue plan follow rescue plan if symptoms flare-up eat healthy/prescribed diet: heart healthy, carbohydrate modified get at least 7 to 8 hours of sleep at night do breathing exercises every day Follow COPD action plan Please consider smoking cessation  Follow Up Plan: Telephone follow up appointment with care management team member scheduled for:  09/10/23 at 945 am        CCM (DIABETES) EXPECTED OUTCOME: MONITOR, SELF-MANAGE AND REDUCE SYMPTOMS OF DIABETES       Current Barriers:  Knowledge Deficits related to Diabetes management Chronic Disease Management support and education needs related to Diabetes, diet Patient reports she checks CBG TID, fasting readings have improved at 120's range, random readings below 200 with today's random reading 168 Patient reports she tries to follow a special diet but loves to eat potato chips Patient reports she was prescribed Prandin at last primary care provider visit but has not started taking yet due to a fear of hypoglycemia, states may reconsider  Planned Interventions: Reviewed medications with patient and discussed importance of medication adherence;        Counseled on importance of regular laboratory monitoring as prescribed;        Advised patient, providing education and rationale, to check cbg three times daily and record        call provider for findings outside established parameters;       Review of patient status, including review of consultants reports, relevant laboratory and other test results, and medications completed;  Reinforced carbohydrate modified diet Reviewed signs/ symptoms of infection In basket sent to primary care provider to inform pt is not take Prandin, copied  pharmacist  Symptom Management: Take medications as prescribed   Attend all scheduled provider appointments Call pharmacy for medication refills 3-7 days in advance of running out of medications Attend church or other social activities Perform all self care activities independently  Perform IADL's (shopping, preparing meals, housekeeping, managing finances) independently Call provider office for new concerns or questions  check blood sugar at prescribed times: three times daily check feet daily for cuts, sores or redness enter blood sugar readings and medication or insulin into daily log take the blood sugar log to all doctor visits take the blood sugar meter to all doctor visits set goal weight trim toenails straight across fill half of plate with vegetables limit fast food meals to no more than 1 per week manage portion size prepare main meal at home 3 to 5 days each week read food labels for fat, fiber, carbohydrates and portion size set a realistic goal keep feet up while sitting wash and dry feet carefully every day wear comfortable, cotton socks wear comfortable, well-fitting shoes Try to limit/ avoid concentrated sweets and processed foods   Follow Up Plan: Telephone follow up appointment with care management team member scheduled for:   09/10/23 at 945 am           Our next appointment is by telephone on 09/10/23 at 945 am  Please call the care guide team at [phone] if you need to cancel or reschedule your appointment.   If you are experiencing a Mental Health or Behavioral Health Crisis or need someone to talk to, please call the Suicide and Crisis Lifeline: 988 call the [place] National Suicide Prevention Lifeline: 8623717453 or TTY: 410-645-6566 TTY 828-226-6567) to talk to a trained counselor call 1-800-273-TALK (toll free, 24 hour hotline) go to [facility] [address] [place] [phone]) call the [facility] Line: [phone] call 911   The patient verbalized understanding of instructions, educational materials, and care plan provided today and DECLINED offer to receive copy of patient instructions, educational materials, and care plan.   Telephone follow up appointment with care management team member scheduled for:  09/10/23 at 945 am  Irving Shows [facility], BSN [place]/ Ambulatory Care Management [phone]

## 2023-06-12 NOTE — Patient Outreach (Signed)
Care Management   Visit Note  06/12/2023 Name: [name] [name] MRN: [id number] DOB: Feb 17, 1951  Subjective: [name] [name] is a 72 y.o. year old female who is a primary care patient of Allwardt, Alyssa M, PA-C. The Care Management team was consulted for assistance.      Engaged with patient spoke with patient by telephone for follow up   Goals Addressed             This Visit's Progress    CCM (COPD) EXPECTED OUTCOME: MONITOR, SELF-MANAGE AND REDUCE SYMPTOMS OF COPD       Current Barriers:  Knowledge Deficits related to COPD management Care Coordination needs related to pharmacy needs/ medication management in a patient with COPD Chronic Disease Management support and education needs related to COPD No Advanced Directives in place- pt declines information Patient reports she lives with spouse, daughter and son in law, is independent with all aspects of her care, reports she has all medications including inhalers and taking as presribed Patient reports she smokes approximately 1/2 ppd cigarettes, not interested in smoking cessation at present, no new concerns  Planned Interventions: Provided instruction about proper use of medications used for management of COPD including inhalers Advised patient to self assesses COPD action plan zone and make appointment with provider if in the yellow zone for 48 hours without improvement Advised patient to engage in light exercise as tolerated 3-5 days a week to aid in the the management of COPD Provided education about and advised patient to utilize infection prevention strategies to reduce risk of respiratory infection Reviewed importance of smoking cessation Reviewed all upcoming scheduled appointments  Symptom Management: Take medications as prescribed   Attend all scheduled provider appointments Call pharmacy for medication refills 3-7 days in advance of running out of medications Attend church or other social activities Perform all self care  activities independently  Perform IADL's (shopping, preparing meals, housekeeping, managing finances) independently Call provider office for new concerns or questions  eliminate smoking in my home identify and remove indoor air pollutants listen for public air quality announcements every day do breathing exercises every day develop a rescue plan follow rescue plan if symptoms flare-up eat healthy/prescribed diet: heart healthy, carbohydrate modified get at least 7 to 8 hours of sleep at night do breathing exercises every day Follow COPD action plan Please consider smoking cessation  Follow Up Plan: Telephone follow up appointment with care management team member scheduled for:  09/10/23 at 945 am        CCM (DIABETES) EXPECTED OUTCOME: MONITOR, SELF-MANAGE AND REDUCE SYMPTOMS OF DIABETES       Current Barriers:  Knowledge Deficits related to Diabetes management Chronic Disease Management support and education needs related to Diabetes, diet Patient reports she checks CBG TID, fasting readings have improved at 120's range, random readings below 200 with today's random reading 168 Patient reports she tries to follow a special diet but loves to eat potato chips Patient reports she was prescribed Prandin at last primary care provider visit but has not started taking yet due to a fear of hypoglycemia, states may reconsider  Planned Interventions: Reviewed medications with patient and discussed importance of medication adherence;        Counseled on importance of regular laboratory monitoring as prescribed;        Advised patient, providing education and rationale, to check cbg three times daily and record        call provider for findings outside established parameters;  Review of patient status, including review of consultants reports, relevant laboratory and other test results, and medications completed;       Reinforced carbohydrate modified diet Reviewed signs/ symptoms of  infection In basket sent to primary care provider to inform pt is not take Prandin, copied pharmacist  Symptom Management: Take medications as prescribed   Attend all scheduled provider appointments Call pharmacy for medication refills 3-7 days in advance of running out of medications Attend church or other social activities Perform all self care activities independently  Perform IADL's (shopping, preparing meals, housekeeping, managing finances) independently Call provider office for new concerns or questions  check blood sugar at prescribed times: three times daily check feet daily for cuts, sores or redness enter blood sugar readings and medication or insulin into daily log take the blood sugar log to all doctor visits take the blood sugar meter to all doctor visits set goal weight trim toenails straight across fill half of plate with vegetables limit fast food meals to no more than 1 per week manage portion size prepare main meal at home 3 to 5 days each week read food labels for fat, fiber, carbohydrates and portion size set a realistic goal keep feet up while sitting wash and dry feet carefully every day wear comfortable, cotton socks wear comfortable, well-fitting shoes Try to limit/ avoid concentrated sweets and processed foods   Follow Up Plan: Telephone follow up appointment with care management team member scheduled for:   09/10/23 at 945 am           Plan: Telephone follow up appointment with care management team member scheduled for: 09/10/23 at 945 am  Irving Shows [facility], BSN [facility]/ Ambulatory Care Management [phone]

## 2023-06-24 ENCOUNTER — Other Ambulatory Visit: Payer: Self-pay

## 2023-06-24 ENCOUNTER — Telehealth: Payer: Self-pay | Admitting: Physician Assistant

## 2023-06-24 DIAGNOSIS — E1165 Type 2 diabetes mellitus with hyperglycemia: Secondary | ICD-10-CM

## 2023-06-24 MED ORDER — GLIPIZIDE ER 5 MG PO TB24: 5.0000 mg | ORAL_TABLET | Freq: Every day | ORAL | 0 refills | Status: DC

## 2023-06-24 NOTE — Telephone Encounter (Signed)
Prescription Request  06/24/2023  LOV: 05/29/2023  What is the name of the medication or equipment? glipiZIDE (GLUCOTROL XL) 5 MG 24 hr tablet   STATES MEDICATION IS NEEDED ASAP  Have you contacted your pharmacy to request a refill? Yes   Which pharmacy would you like this sent to?  [id number] [place], [place] - [phone] AT [facility] OF [facility] & [address] [place] [address] Phone: [phone] Fax: [phone]   Patient notified that their request is being sent to the clinical staff for review and that they should receive a response within 2 business days.   Please advise at Mobile [phone] (mobile)

## 2023-06-24 NOTE — Telephone Encounter (Signed)
Rx sent to pharmacy   

## 2023-07-08 ENCOUNTER — Ambulatory Visit: Payer: Medicare HMO | Admitting: Pulmonary Disease

## 2023-07-08 ENCOUNTER — Encounter: Payer: Self-pay | Admitting: Pulmonary Disease

## 2023-07-08 VITALS — BP 112/66 | HR 82 | Ht 65.0 in | Wt 296.8 lb

## 2023-07-08 DIAGNOSIS — R0602 Shortness of breath: Secondary | ICD-10-CM | POA: Diagnosis not present

## 2023-07-08 DIAGNOSIS — J449 Chronic obstructive pulmonary disease, unspecified: Secondary | ICD-10-CM

## 2023-07-08 NOTE — Progress Notes (Unsigned)
[name] [name]    [id number]    02-25-1951  Primary Care Physician:Allwardt, Crist Infante, PA-C  Referring Physician: Allwardt, Crist Infante, PA-C [address] [place],  [place] [address]  Chief complaint:   Patient with obstructive lung disease In for evaluation  HPI:  Shortness of breath on exertion  Recently started on Rexulti which she has noticed some improvement with  Uses albuterol as needed usually when she first wakes up and then sometime during the day  Limited with activities of daily living  Does have some pain and discomfort with limits activity  Active smoker about a pack a day  Uses Mucinex for secretion clearance  No pertinent occupational history    Outpatient Encounter Medications as of 07/08/2023  Medication Sig   Accu-Chek FastClix Lancets MISC USE TO TEST UP TO FOUR TIMES DAILY AS DIRECTED   ACCU-CHEK GUIDE test strip USE AS DIRECTED   albuterol (ACCUNEB) 1.25 MG/3ML nebulizer solution TAKE 3 MLS BY NEBULIZATION THREE TIMES DAILY AS NEEDED FOR WHEEZING   albuterol (VENTOLIN HFA) 108 (90 Base) MCG/ACT inhaler INHALE 2 PUFFS INTO THE LUNGS EVERY 6 HOURS AS NEEDED FOR WHEEZING OR SHORTNESS OF BREATH   ALPRAZolam (XANAX) 0.5 MG tablet TAKE 1 TABLET(0.5 MG) BY MOUTH TWICE DAILY AS NEEDED FOR ANXIETY   aspirin 81 MG tablet Take 81 mg by mouth daily.   atorvastatin (LIPITOR) 10 MG tablet TAKE 1 TABLET(10 MG) BY MOUTH DAILY   blood glucose meter kit and supplies KIT Dispense based on patient and insurance preference. Use up to four times daily as directed. DX E11.8   Budeson-Glycopyrrol-Formoterol (BREZTRI AEROSPHERE) 160-9-4.8 MCG/ACT AERO Inhale 2 puffs into the lungs 2 (two) times daily.   Calcium Carb-Cholecalciferol (CALCIUM 500 + D3 PO) Take by mouth.   canagliflozin (INVOKANA) 100 MG TABS tablet Take 1 tablet (100 mg total) by mouth daily before breakfast.   furosemide (LASIX) 20 MG tablet Taking 3 times a week (Patient taking differently: Taking  1 times a week)   glipiZIDE (GLUCOTROL XL) 5 MG 24 hr tablet Take 1 tablet (5 mg total) by mouth daily with breakfast.   ipratropium (ATROVENT) 0.06 % nasal spray USE 2 SPRAYS IN EACH NOSTRIL FOUR TIMES DAILY AS DIRECTED   ipratropium-albuterol (DUONEB) 0.5-2.5 (3) MG/3ML SOLN USE 3 ML VIA NEBULIZER EVERY 6 HOURS AS NEEDED   isosorbide mononitrate (IMDUR) 60 MG 24 hr tablet TAKE 1 TABLET(60 MG) BY MOUTH DAILY   levothyroxine (SYNTHROID) 100 MCG tablet TAKE 1 TABLET(100 MCG) BY MOUTH DAILY   meloxicam (MOBIC) 7.5 MG tablet Take 1 tablet (7.5 mg total) by mouth daily.   Menthol, Topical Analgesic, (BIOFREEZE) 4 % GEL Apply topically.   Olopatadine HCl 0.2 % SOLN Apply 1 drop to eye 2 (two) times daily.   pantoprazole (PROTONIX) 40 MG tablet TAKE 1 TABLET(40 MG) BY MOUTH DAILY   vitamin B-12 (CYANOCOBALAMIN) 1000 MCG tablet Take 1,000 mcg by mouth daily.   [DISCONTINUED] repaglinide (PRANDIN) 0.5 MG tablet Take 1 tablet (0.5 mg total) by mouth 3 (three) times daily before meals. (Patient not taking: Reported on 06/12/2023)   No facility-administered encounter medications on file as of 07/08/2023.    Allergies as of 07/08/2023 - Review Complete 07/08/2023  Allergen Reaction Noted   Clindamycin/lincomycin Other (See Comments) 08/25/2017   Codeine Hives 03/31/2011   Cortisone  07/02/2020   Dilaudid [hydromorphone hcl] Nausea And Vomiting 03/31/2011   Iodides  04/15/2016   Reglan [metoclopramide]  Other (See Comments) 03/31/2011   Tramadol  08/03/2014   Trulicity [dulaglutide] Other (See Comments) 03/25/2023   Tape Rash 02/29/2016    Past Medical History:  Diagnosis Date   Bronchitis, asthmatic    Coronary artery disease    BMS to LAD 02/2008   Diabetes mellitus without complication (HCC)    Hyperlipidemia    Hypertensive heart disease 04/16/2016   Hypothyroidism    MI, old    Morbid obesity (HCC) 04/16/2016   S/P coronary artery stent placement 04/16/2016   BMS to LAD 2009   Tobacco  abuse 04/16/2016    Past Surgical History:  Procedure Laterality Date   ABDOMINAL HYSTERECTOMY     35; very heavy bleeding   ANKLE SURGERY Right    CHOLECYSTECTOMY     CORONARY ANGIOPLASTY  02/2008   bare metal stent to LAD    CORONARY STENT PLACEMENT     KNEE ARTHROSCOPY     OOPHORECTOMY Left    age 48   ROTATOR CUFF REPAIR Bilateral    THORACIC OUTLET SURGERY     WRIST SURGERY      Family History  Problem Relation Age of Onset   Cancer Mother    Diabetes Son    Hypertension Son     Social History   Socioeconomic History   Marital status: Married    Spouse name: Not on file   Number of children: Not on file   Years of education: Not on file   Highest education level: Not on file  Occupational History   Occupation: Disabled   Tobacco Use   Smoking status: Every Day    Current packs/day: 0.25    Average packs/day: 0.3 packs/day for 31.0 years (7.8 ttl pk-yrs)    Types: Cigarettes   Smokeless tobacco: Never   Tobacco comments:    1 PPD  Vaping Use   Vaping status: Never Used  Substance and Sexual Activity   Alcohol use: No   Drug use: No   Sexual activity: Yes    Partners: Male  Other Topics Concern   Not on file  Social History Narrative   Originally from [place]     Still drives    Social Determinants of Health   Financial Resource Strain: Low Risk  (02/11/2023)   Overall Financial Resource Strain (CARDIA)    Difficulty of Paying Living Expenses: Not hard at all  Food Insecurity: No Food Insecurity (02/11/2023)   Hunger Vital Sign    Worried About Running Out of Food in the Last Year: Never true    Ran Out of Food in the Last Year: Never true  Transportation Needs: No Transportation Needs (02/11/2023)   PRAPARE - Administrator, Civil Service (Medical): No    Lack of Transportation (Non-Medical): No  Physical Activity: Inactive (02/11/2023)   Exercise Vital Sign    Days of Exercise per Week: 0 days    Minutes of Exercise per Session: 0  min  Stress: No Stress Concern Present (02/11/2023)   [employer] of Occupational Health - Occupational Stress Questionnaire    Feeling of Stress : Not at all  Social Connections: Moderately Isolated (02/11/2023)   Social Connection and Isolation Panel [NHANES]    Frequency of Communication with Friends and Family: More than three times a week    Frequency of Social Gatherings with Friends and Family: Twice a week    Attends Religious Services: Never    Database administrator or Organizations: No  Attends Banker Meetings: Never    Marital Status: Married  Catering manager Violence: Not At Risk (02/11/2023)   Humiliation, Afraid, Rape, and Kick questionnaire    Fear of Current or Ex-Partner: No    Emotionally Abused: No    Physically Abused: No    Sexually Abused: No    Review of Systems  Constitutional:  Positive for fatigue.  Respiratory:  Positive for cough and shortness of breath.     Vitals:   07/08/23 1344  BP: 112/66  Pulse: 82  SpO2: 93%     Physical Exam Constitutional:      Appearance: She is obese.  HENT:     Head: Normocephalic.     Mouth/Throat:     Mouth: Mucous membranes are moist.  Eyes:     General: No scleral icterus. Cardiovascular:     Rate and Rhythm: Normal rate and regular rhythm.     Heart sounds: No murmur heard.    No friction rub.  Pulmonary:     Effort: No respiratory distress.     Breath sounds: No stridor. Rhonchi present. No wheezing.  Musculoskeletal:     Cervical back: No rigidity or tenderness.  Neurological:     Mental Status: She is alert.  Psychiatric:        Mood and Affect: Mood normal.      Data Reviewed: CT scan of the chest from April 2024 reviewed-no significant abnormality  Echocardiogram from 2020 shows mild diastolic dysfunction  Assessment:  Active smoker  Shortness of breath on exertion  Class III obesity    Plan/Recommendations: ***   Virl Diamond MD [facility] 07/08/2023, 2:17 PM  CC: Allwardt, Crist Infante, PA-C

## 2023-07-08 NOTE — Patient Instructions (Signed)
Continue breakthrough twice a day  Continue albuterol nebulizer can be used up to 4 times a day  Schedule for breathing study  Graded exercises as tolerated  Continue to work on quitting smoking  Make sure you are making an effort on a daily basis to try and quit smoking

## 2023-07-16 ENCOUNTER — Other Ambulatory Visit: Payer: Self-pay | Admitting: Physician Assistant

## 2023-07-17 ENCOUNTER — Other Ambulatory Visit: Payer: Medicare HMO | Admitting: Pharmacist

## 2023-07-17 NOTE — Progress Notes (Signed)
07/17/2023 Name: [name] [name] MRN: [id number] DOB: 09/27/1950  Chief Complaint  Patient presents with   Medication Management    [name] [name] is a 72 y.o. year old female who presented for a telephone visit.   They were referred to the pharmacist by their PCP for assistance in managing medication access, complex medication management, and diabetes  Subjective:  Care Team: Primary Care Provider: Allwardt, Crist Infante, PA-C ; Next Scheduled Visit: 09/01/2023 Podiatrist - next visit 07/21/2023 Cardiologist: Dr Duke [facility]; Next Scheduled Visit: recall planned for 01/2024 Pulmonologist - Dr Wynona Neat; Next Scheduled Visit: 10/07/2023  Medication Access/Adherence  Current Pharmacy:  [facility] #[id number] [place], [place] - [address] AT [facility] OF [facility] & [address] [place] [address] Phone: [phone] Fax: [phone]  [facility] - [place], [place] - [address] [place] [address] Phone: [phone] Fax: [phone]  [facility] - [place], [place] - [address] [place] [address] Phone: [phone] Fax: [phone]  [facility] - [place], [place] - [address] [place] [address] [place] [address] Phone: [phone] Fax: [phone]   Patient reports affordability concerns with their medications: Yes  - She had Medicare LIS / Extra Help thru 01/20/2023. Patient received a letter from [employer] stating that she was no longer eligible for LIS after addition of her husband's VA benefits to monthly income.   [place] get [name] [name] - patient was approved thru 09/22/2023 Applied for [place] medication assistance program for [name] [name] - was approved 04/28/2023. Patient reports she has 2 bottles left.  Will need refill before 09/22/2023  Patient reports access/transportation concerns to their pharmacy: No    Patient reports adherence concerns with their medications:  No     Diabetes:  Current medications: Invokana 100mg  daily; glipizide ER 5mg  daily   Medications tried in the past: metformin - stopped due to decreased in renal function; Ozempic - caused severe constipation; Tradgenta 5mg  - stopped due to cost; Invokana 300mg  - dose adjusted for renal function.   Current glucose readings: 128, 120 up to 140.  No blood glucose < 80 or > 200.  testing 1 to 2 times daily  Patient denies hypoglycemic s/sx including no dizziness, shakiness, sweating. Patient denies hyperglycemic symptoms including no polyuria, polydipsia, polyphagia, nocturia, neuropathy, blurred vision.  Current physical activity: walking in pool   Annual Eye Exam: not yet in 2024. She is saving to have cataract surgery (copay per eye is about $140). She doesn't want to have eye exam until after cataract surgery.    Hyperlipidemia/ASCVD Risk Reduction  Current lipid lowering medications: atorvastatin 10mg  daily   Antiplatelet regimen: aspirin 81mg  daily   ASCVD History: CAD  Risk Factors: Type 2 DM; age   COPD:  Current medications: Breztri 2 puffs twice a day (started around 04/03/2023) Rescue inhaler use? Uses about 3 times per week.   She also has been taking over-the-counter Mucinex daily and feels this has been beneficial for thinning mucus.   Has had 3 exacerbations in the past year (took round of prednisone 07/2022, 12/2022 and 03/25/2023)    Objective:  Lab Results  Component Value Date   HGBA1C 7.9 (A) 05/29/2023    Lab Results  Component Value Date   CREATININE 1.46 (H) 01/02/2023   BUN 22 01/02/2023   NA  140 01/02/2023   K 4.7 01/02/2023   CL 101 01/02/2023   CO2 31 01/02/2023    Lab Results  Component Value Date   CHOL 122 06/12/2022   HDL 41.30 06/12/2022   LDLCALC 41 06/12/2022   LDLDIRECT 60.0 05/16/2019   TRIG 194.0 (H) 06/12/2022   CHOLHDL 3 06/12/2022    Medications  Reviewed Today     Reviewed by Henrene Pastor, RPH-CPP (Pharmacist) on 07/17/23 at 1119  Med List Status: <None>   Medication Order Taking? Sig Documenting Provider Last Dose Status Informant  Accu-Chek FastClix Lancets MISC [id number] Yes USE TO TEST UP TO FOUR TIMES DAILY AS DIRECTED Orland Mustard, MD Taking Active   ACCU-CHEK GUIDE test strip [id number] Yes USE AS DIRECTED Allwardt, Crist Infante, PA-C Taking Active   albuterol (ACCUNEB) 1.25 MG/3ML nebulizer solution [id number] Yes TAKE 3 MLS BY NEBULIZATION THREE TIMES DAILY AS NEEDED FOR WHEEZING Allwardt, Alyssa M, PA-C Taking Active   albuterol (VENTOLIN HFA) 108 (90 Base) MCG/ACT inhaler [id number] Yes INHALE 2 PUFFS INTO THE LUNGS EVERY 6 HOURS AS NEEDED FOR WHEEZING OR SHORTNESS OF BREATH Ardith Dark, MD Taking Active   ALPRAZolam Prudy Feeler) 0.5 MG tablet [id number] Yes TAKE 1 TABLET(0.5 MG) BY MOUTH TWICE DAILY AS NEEDED FOR ANXIETY Allwardt, Alyssa M, PA-C Taking Active   aspirin 81 MG tablet [id number] Yes Take 81 mg by mouth daily. [provider] Taking Active Self  atorvastatin (LIPITOR) 10 MG tablet [id number] Yes TAKE 1 TABLET(10 MG) BY MOUTH DAILY Chilton Si, MD Taking Active   blood glucose meter kit and supplies KIT [id number]  Dispense based on patient and insurance preference. Use up to four times daily as directed. DX E11.8 Allwardt, Crist Infante, PA-C  Active   Budeson-Glycopyrrol-Formoterol (BREZTRI AEROSPHERE) 160-9-4.8 MCG/ACT AERO [id number] Yes Inhale 2 puffs into the lungs 2 (two) times daily. Allwardt, Crist Infante, PA-C Taking Active            Med Note Haze Justin   Fri Apr 10, 2023 11:46 AM) [place] and [place] Program thru 09/22/2023  Calcium Carb-Cholecalciferol (CALCIUM 500 + D3 PO) [id number]  Take by mouth. [provider]  Active   canagliflozin (INVOKANA) 100 MG TABS tablet [id number] Yes Take 1 tablet (100 mg total) by mouth daily before breakfast. Allwardt, Crist Infante, PA-C Taking Active            Med Note  Clydie Braun, Evvie Behrmann B   Fri Jul 17, 2023 11:13 AM) Getting thru [employer] med assistance thru 08/2023  furosemide (LASIX) 20 MG tablet [id number] Yes Taking 3 times a week  Patient taking differently: Taking 1 times a week   Allwardt, Alyssa M, PA-C Taking Active   glipiZIDE (GLUCOTROL XL) 5 MG 24 hr tablet [id number] Yes Take 1 tablet (5 mg total) by mouth daily with breakfast. Allwardt, Crist Infante, PA-C Taking Active   ipratropium (ATROVENT) 0.06 % nasal spray [id number] Yes USE 2 SPRAYS IN EACH NOSTRIL FOUR TIMES DAILY AS DIRECTED Allwardt, Alyssa M, PA-C Taking Active   ipratropium-albuterol (DUONEB) 0.5-2.5 (3) MG/3ML SOLN [id number] Yes USE 3 ML VIA NEBULIZER EVERY 6 HOURS AS NEEDED Allwardt, Alyssa M, PA-C Taking Active   isosorbide mononitrate (IMDUR) 60 MG 24 hr tablet [id number] Yes TAKE 1 TABLET(60 MG) BY MOUTH DAILY Chilton Si, MD Taking Active   levothyroxine (SYNTHROID) 100 MCG tablet [id number] Yes TAKE 1 TABLET(100 MCG) BY MOUTH DAILY Allwardt, Alyssa M, PA-C Taking Active   meloxicam (MOBIC) 7.5 MG tablet [id number] Yes  Take 1 tablet (7.5 mg total) by mouth daily. Allwardt, Crist Infante, PA-C Taking Active   Menthol, Topical Analgesic, (BIOFREEZE) 4 % GEL [id number]  Apply topically. [provider]  Active   Olopatadine HCl 0.2 % SOLN [id number]  Apply 1 drop to eye 2 (two) times daily. Allwardt, Crist Infante, PA-C  Active   pantoprazole (PROTONIX) 40 MG tablet [id number] Yes TAKE 1 TABLET(40 MG) BY MOUTH DAILY Allwardt, Alyssa M, PA-C Taking Active   vitamin B-12 (CYANOCOBALAMIN) 1000 MCG tablet [id number] Yes Take 1,000 mcg by mouth daily. [provider] Taking Active               Assessment/Plan:   Diabetes: blood glucose improving. Last A1c was a little higher than in past but this included time when we were getting medication assistance program for Invokana.  - Reviewed goal A1c, goal fasting, and goal 2 hour post prandial glucose - Continue Invokana and glipizide -  Recommend to check glucose 1 to 2 times per day - Tried to call [place] medication assistance program to verify that patient has refills for Invokana but I was not able to get anyone on there phone - "all pharmacy personel are busy, please try your call again" - Will ask Med Assist Team to mail patient 2025 applications for Invokana / Linwood Dibbles and [name] [name] / [place] and [place]  Hyperlipidemia/ASCVD Risk Reduction: LDL at goal of < 55; Triglycerides slightly above goal of < 150. - Recommend to continue atorvastatin 10mg  daily.  COPD: 3 exacerbations in the last year but improved over the last 3 months - Continue Breztri - inhale 2 puffs into lungs twice a day - Continue Mucinex up to 1200mg  twice a day   Reviewed possible 2025 medication costs if patient continues with current [facility] plan. She will have a $250 deductible at the start of 2025.  For the first month when she fills meds cost would be around $330, months 2 - 5 cost would be $120; then from around June thru December medication cost would be $0.   Follow Up Plan: 6 to 8 weeks to follow up on medication assistance program applications   Henrene Pastor, PharmD Clinical Pharmacist [facility]

## 2023-07-20 ENCOUNTER — Other Ambulatory Visit: Payer: Self-pay | Admitting: Physician Assistant

## 2023-07-21 ENCOUNTER — Ambulatory Visit: Payer: Medicare HMO | Admitting: Podiatry

## 2023-07-24 ENCOUNTER — Telehealth: Payer: Self-pay | Admitting: Physician Assistant

## 2023-07-24 ENCOUNTER — Other Ambulatory Visit: Payer: Self-pay

## 2023-07-24 ENCOUNTER — Other Ambulatory Visit: Payer: Self-pay | Admitting: Family Medicine

## 2023-07-24 DIAGNOSIS — F419 Anxiety disorder, unspecified: Secondary | ICD-10-CM

## 2023-07-24 DIAGNOSIS — E1165 Type 2 diabetes mellitus with hyperglycemia: Secondary | ICD-10-CM

## 2023-07-24 MED ORDER — ALPRAZOLAM 0.5 MG PO TABS
ORAL_TABLET | ORAL | 0 refills | Status: DC
Start: 2023-07-24 — End: 2023-08-17

## 2023-07-24 NOTE — Telephone Encounter (Signed)
Prescription Request  07/24/2023  LOV: 05/29/2023  What is the name of the medication or equipment?  ALPRAZolam (XANAX) 0.5 MG tablet   Have you contacted your pharmacy to request a refill? Yes   Which pharmacy would you like this sent to?  [facility][id number] Ginette Otto, [place] - [phone] [address] AT [facility] & [address] [place] [address] Phone: [phone] Fax: [phone]   Patient notified that their request is being sent to the clinical staff for review and that they should receive a response within 2 business days.   Please advise at Mobile [phone] (mobile)

## 2023-07-24 NOTE — Telephone Encounter (Signed)
Last OV: 05/29/23  Next OV: 09/01/2023  Last Filled: 02/20/23  Quantity: 60 w/ 2 refills  Patient brother passed away this week and requesting refill

## 2023-07-27 ENCOUNTER — Other Ambulatory Visit: Payer: Medicare HMO

## 2023-08-14 ENCOUNTER — Other Ambulatory Visit: Payer: Self-pay | Admitting: Physician Assistant

## 2023-08-14 DIAGNOSIS — F419 Anxiety disorder, unspecified: Secondary | ICD-10-CM

## 2023-08-14 NOTE — Telephone Encounter (Signed)
Prescription Request  08/14/2023  LOV: 05/29/2023  What is the name of the medication or equipment? ALPRAZolam (XANAX) 0.5 MG tablet PT WANTS TO MAKE SURE SHE GETS FULL 30 DAY SUPPLY  Have you contacted your pharmacy to request a refill? Yes   Which pharmacy would you like this sent to?  [facility] #[id number] [place], [place] - [phone] [address] AT [facility] OF [facility] & [address] [place] [address] [place] [address] Phone: [phone] Fax: [phone]      Patient notified that their request is being sent to the clinical staff for review and that they should receive a response within 2 business days.   Please advise at Mobile [phone] (mobile)

## 2023-08-17 MED ORDER — ALPRAZOLAM 0.5 MG PO TABS
ORAL_TABLET | ORAL | 2 refills | Status: DC
Start: 2023-08-17 — End: 2024-01-15

## 2023-08-17 NOTE — Telephone Encounter (Signed)
Last OV: 05/29/23  Next OV: None  Last Filled: 07/24/23  Quantity: 30 tablets  Last prescription pt was sent 30 but prescribed up to 2 daily

## 2023-08-18 ENCOUNTER — Other Ambulatory Visit: Payer: Self-pay | Admitting: Pharmacist

## 2023-08-18 NOTE — Progress Notes (Signed)
08/18/2023 Name: [name] [name] MRN: [id number] DOB: 1950-12-01  Chief Complaint  Patient presents with   Medication Management    [name] [name] is a 72 y.o. year old female who presented for a telephone visit.   They were referred to the pharmacist by their PCP for assistance in managing medication access  Subjective:  Care Team: Primary Care Provider: Allwardt, Crist Infante, PA-C ; Next Scheduled Visit: 09/01/2023 Podiatrist - next visit 07/21/2023 Cardiologist: Dr Duke Salvia [facility]; Next Scheduled Visit: recall planned for 01/2024 Pulmonologist - Dr Wynona Neat; Next Scheduled Visit: 10/07/2023  Medication Access/Adherence  Current Pharmacy:  [facility] #[id number] Ginette Otto, [place] - [address] AT [facility] OF [facility] & [address] [place] [address] Phone: [phone] Fax: [phone]  [facility] - [place], [place] - [address] [place] [address] Phone: [phone] Fax: [phone]  [facility] - [place], [place] - [address] [place] [address] Phone: [phone] Fax: [phone]  [facility] - [place], [place] - [address] [place] [address] [place] [address] Phone: [phone] Fax: [phone]   Patient reports affordability concerns with their medications: Yes  - She had Medicare LIS / Extra Help thru 01/20/2023. Patient received a letter from [employer] stating that she was no longer eligible for LIS after addition of her husband's VA benefits to monthly income.   Applied for [place] and [place] to get Markus Daft - patient was approved thru 09/22/2023. Patient reports she has 3 inhalers left.  Applied for [employer] medication assistance program for Theodis Sato - was approved 04/28/2023. Patient reports she has 3 bottles left.  W  Patient reports access/transportation concerns to their pharmacy: No   Patient reports adherence  concerns with their medications:  No     Diabetes:  Current medications: Invokana 100mg  daily; glipizide ER 5mg  daily   Medications tried in the past: metformin - stopped due to decreased in renal function; Ozempic - caused severe constipation; Tradgenta 5mg  - stopped due to cost; Invokana 300mg  - dose adjusted for renal function.   Annual Eye Exam: not yet in 2024. She is saving to have cataract surgery (copay per eye is about $140). She doesn't want to have eye exam until after cataract surgery.    Hyperlipidemia/ASCVD Risk Reduction  Current lipid lowering medications: atorvastatin 10mg  daily   Antiplatelet regimen: aspirin 81mg  daily   ASCVD History: CAD  Risk Factors: Type 2 DM; age   COPD:  Current medications: Breztri 2 puffs twice a day (started around 04/03/2023) Rescue inhaler use? Uses about 2 or 3 times per week.   Has had 3 exacerbations in the past year (took round of prednisone 07/2022, 12/2022 and 03/25/2023)    Objective:  Lab Results  Component Value Date   HGBA1C 7.9 (A) 05/29/2023    Lab Results  Component Value Date   CREATININE 1.46 (H) 01/02/2023   BUN 22 01/02/2023   NA 140 01/02/2023   K 4.7 01/02/2023   CL 101 01/02/2023   CO2 31 01/02/2023    Lab Results  Component Value Date   CHOL 122 06/12/2022   HDL 41.30 06/12/2022   LDLCALC 41 06/12/2022   LDLDIRECT 60.0 05/16/2019   TRIG 194.0 (H) 06/12/2022   CHOLHDL 3 06/12/2022    Medications Reviewed Today     Reviewed by Henrene Pastor, RPH-CPP (Pharmacist) on  08/18/23 at 1125  Med List Status: <None>   Medication Order Taking? Sig Documenting Provider Last Dose Status Informant  Accu-Chek FastClix Lancets MISC [id number]  USE TO TEST UP TO FOUR TIMES DAILY AS DIRECTED Orland Mustard, MD  Active   ACCU-CHEK GUIDE test strip [id number]  USE AS DIRECTED Allwardt, Alyssa M, PA-C  Active   albuterol (ACCUNEB) 1.25 MG/3ML nebulizer solution [id number]  TAKE 3 MLS BY NEBULIZATION THREE  TIMES DAILY AS NEEDED FOR WHEEZING Allwardt, Alyssa M, PA-C  Active   albuterol (VENTOLIN HFA) 108 (90 Base) MCG/ACT inhaler [id number]  INHALE 2 PUFFS INTO THE LUNGS EVERY 6 HOURS AS NEEDED FOR WHEEZING OR SHORTNESS OF BREATH Ardith Dark, MD  Active   ALPRAZolam Prudy Feeler) 0.5 MG tablet [id number]  TAKE 1 TABLET(0.5 MG) BY MOUTH TWICE DAILY AS NEEDED FOR ANXIETY Allwardt, Alyssa M, PA-C  Active   aspirin 81 MG tablet [id number]  Take 81 mg by mouth daily. [provider]  Active Self  atorvastatin (LIPITOR) 10 MG tablet [id number]  TAKE 1 TABLET(10 MG) BY MOUTH DAILY Chilton Si, MD  Active   blood glucose meter kit and supplies KIT [id number]  Dispense based on patient and insurance preference. Use up to four times daily as directed. DX E11.8 Allwardt, Crist Infante, PA-C  Active   Budeson-Glycopyrrol-Formoterol (BREZTRI AEROSPHERE) 160-9-4.8 MCG/ACT AERO [id number]  Inhale 2 puffs into the lungs 2 (two) times daily. Allwardt, Crist Infante, PA-C  Active            Med Note Haze Justin   Fri Apr 10, 2023 11:46 AM) [place] and [place] Program thru 09/22/2023  Calcium Carb-Cholecalciferol (CALCIUM 500 + D3 PO) [id number]  Take by mouth. [provider]  Active   furosemide (LASIX) 20 MG tablet [id number]  Taking 3 times a week  Patient taking differently: Taking 1 times a week   Allwardt, Alyssa M, PA-C  Active   glipiZIDE (GLUCOTROL XL) 5 MG 24 hr tablet [id number]  Take 1 tablet (5 mg total) by mouth daily with breakfast. Allwardt, Crist Infante, PA-C  Active   INVOKANA 100 MG TABS tablet [id number] Yes Take 100 mg by mouth every morning. [provider]  Active            Med Note Clydie Braun, Glenna Durand   Tue Aug 18, 2023 10:37 AM) Approved the [employer] assistance thru 09/22/2023  ipratropium (ATROVENT) 0.06 % nasal spray [id number]  USE 2 SPRAYS IN EACH NOSTRIL FOUR TIMES DAILY AS DIRECTED Allwardt, Alyssa M, PA-C  Active   ipratropium-albuterol (DUONEB) 0.5-2.5 (3) MG/3ML SOLN [id number]  USE 3 ML  VIA NEBULIZER EVERY 6 HOURS AS NEEDED Allwardt, Alyssa M, PA-C  Active   isosorbide mononitrate (IMDUR) 60 MG 24 hr tablet [id number]  TAKE 1 TABLET(60 MG) BY MOUTH DAILY Chilton Si, MD  Active   levothyroxine (SYNTHROID) 100 MCG tablet [id number]  TAKE 1 TABLET(100 MCG) BY MOUTH DAILY Allwardt, Alyssa M, PA-C  Active   meloxicam (MOBIC) 7.5 MG tablet [id number]  TAKE 1 TABLET(7.5 MG) BY MOUTH DAILY Allwardt, Alyssa M, PA-C  Active   Menthol, Topical Analgesic, (BIOFREEZE) 4 % GEL [id number]  Apply topically. [provider]  Active   Olopatadine HCl 0.2 % SOLN [id number]  Apply 1 drop to eye 2 (two) times daily. Allwardt, Crist Infante, PA-C  Active   pantoprazole (PROTONIX) 40 MG tablet [id number]  TAKE 1 TABLET(40 MG) BY MOUTH DAILY Allwardt, Alyssa M, PA-C  Active   vitamin B-12 (  CYANOCOBALAMIN) 1000 MCG tablet [id number]  Take 1,000 mcg by mouth daily. [provider]  Active               Assessment/Plan:   Diabetes: blood glucose improving. Last A1c was a little higher than in past but this included time when we were getting medication assistance program for Invokana and she had not been taking Invokana regularly due to cost. Adherence has improved since she was approved for medication assistance program.  - Reviewed goal A1c, goal fasting, and goal 2 hour post prandial glucose - Continue Invokana 100mg  daily (adjusted based on eGFR) and glipizide ER 5mg  daily - Patient to see PCP in December - if additional therapy needed could consider retrial of GLP1 type agent but cost in 2024 would be high since she is in coverage gap. 2025 cost would be less once she completes deductible phase.  - Recommend to check glucose 1 to 2 times per day - Reviewed Laural Benes and [place]ural Benes / Invokana medication assistance program for 2025 - patient is not able to apply for medication assistance program currently. She will have to spend 4% of income out of pocket on medications in 2025 to reapply.   - Reviewed her 47 [employer] benefits - pt states she will continue with same plan [phone]. Invokana should be $47 thought she might have a deductible to meet at the beginning of 2025.  - Called [place] and [place] medication assistance program. Patient has been "conditionally approved" for 2025 to receive Breztri form [place] and [place] but she needs to call them to complete re-enrollment. Provided pt with phone number to call [phone]  Hyperlipidemia/ASCVD Risk Reduction: LDL at goal of < 55; Triglycerides slightly above goal of < 150. - Recommend to continue atorvastatin 10mg  daily.  COPD: 3 exacerbations in the last year but improved over the last 3 months - Continue Breztri - inhale 2 puffs into lungs twice a day - Continue Mucinex up to 1200mg  twice a day .   Follow Up Plan: 1 to 2 months to check on 2025 med costs and medication assistance program    Henrene Pastor, PharmD Clinical Pharmacist [facility]

## 2023-09-01 ENCOUNTER — Ambulatory Visit: Payer: Medicare HMO | Admitting: Physician Assistant

## 2023-09-03 ENCOUNTER — Other Ambulatory Visit: Payer: Self-pay | Admitting: Family Medicine

## 2023-09-03 ENCOUNTER — Other Ambulatory Visit (HOSPITAL_BASED_OUTPATIENT_CLINIC_OR_DEPARTMENT_OTHER): Payer: Self-pay | Admitting: Cardiovascular Disease

## 2023-09-03 ENCOUNTER — Other Ambulatory Visit: Payer: Self-pay | Admitting: Physician Assistant

## 2023-09-03 DIAGNOSIS — J4541 Moderate persistent asthma with (acute) exacerbation: Secondary | ICD-10-CM

## 2023-09-08 ENCOUNTER — Telehealth: Payer: Self-pay | Admitting: Physician Assistant

## 2023-09-08 NOTE — Telephone Encounter (Signed)
Noted and agreed, thank you. 

## 2023-09-08 NOTE — Telephone Encounter (Signed)
FYI: This call has been transferred to triage nurse: the Triage Nurse. Once the result note has been entered staff can address the message at that time.  Patient called in with the following symptoms:  Red Word: Blood sugars are currently 415   Please advise at [facility] [phone]  Message is routed to Provider Pool.

## 2023-09-08 NOTE — Telephone Encounter (Signed)
Please see triage note; pt advised to go to ED

## 2023-09-08 NOTE — Telephone Encounter (Signed)
Patient Advised: Go to ED Now  Patient declined to go to ED  Patient is scheduled for appointment 09/09/23 with Dulce Sellar for possible sinus infection   Patient Name First: Anagha Last: Alig Gender: Female DOB: [birth year] Age: 72 Y 2 M 22 D Return Phone Number: [phone] (Primary) Address: City/ State/ Zip: [place]  [address] Client  [facility] at [facility] - Administrator, sports at [facility] Insurance claims handler, Media planner- PA Contact Type Call Who Is Calling Patient / Member / Family / Caregiver Call Type Triage / Clinical Caller Name Bonita Quin at [facility] Relationship To Patient Other Return Phone Number [phone] (Primary) Chief Complaint WHEEZING Reason for Call Symptomatic / Request for Health Information Initial Comment Caller states Linda at [facility] with a patient on the line with blood sugar currently 415. She just took an extra Glipizide for it. She is sick and don't feel good so she took a cough pearl and not sure if its from that but its never been like that before. Caller said she thinks she has unpper respiratory with sinuses and coughing. She is wheezing so she knows upper respiratory. GOTO Facility Not Listed Declined to be seen since no appts available at the office, feels like breathing is fine enough to wait. Translation No Nurse Assessment Nurse: Izora Ribas, RN, Melanie Date/Time (Eastern Time): 09/08/2023 2:10:22 PM Confirm and document reason for call. If symptomatic, describe symptoms. ---Caller states BG is 415. Took extra Glipizide 5mg daily about 15 minutes. Coughing for several days, took perles URI with sinus headache. Wheezing - inhalers/Nebs. Has an appt tomorrow at [facility] 1420? Does the patient have any new or worsening symptoms? ---Yes Will a triage be completed? ---Yes Related visit to physician within the last 2 weeks? ---N/A Does the PT have any chronic conditions? (i.e. diabetes,  asthma, this includes High risk factors for pregnancy, etc.) ---Yes Is this a behavioral health or substance abuse call? ---No Guidelines Guideline Title Affirmed Question Affirmed Notes Nurse Date/Time (Eastern Time) Diabetes - High Blood Sugar Blood glucose > 400 mg/dL (53.6 mmol/L) Izora Ribas, RN, Melanie 09/08/2023 2:15:15 PM Breathing Difficulty Wheezing can be heard across the room [place], RN, Melanie 09/08/2023 2:21:04 PM Disp. Time Lamount Cohen Time) Disposition Final User 09/08/2023 2:08:20 PM Send to Urgent Queue Linna Caprice 09/08/2023 2:20:32 PM Call PCP Now Izora Ribas, RN, Melanie 09/08/2023 2:22:34 PM Go to ED Now Yes Izora Ribas, RN, Melanie Final Disposition 09/08/2023 2:22:34 PM Go to ED Now Yes Izora Ribas, RN, Mittie Bodo Disagree/Comply Disagree Caller Understands Yes PreDisposition Call Doctor Care Advice Given Per Guideline CALL PCP NOW: * You need to discuss this with your doctor (or NP/PA). * I'll page the on-call provider now. If you haven't heard from the provider (or me) within 30 minutes, call again. ALTERNATE DISPOSITION - CALL YOUR DIABETES SPECIALIST NOW: CHECK YOUR BLOOD GLUCOSE AGAIN: * If you have not done so already, recheck your blood glucose to make certain that it really is high. CALL BACK IF: * Vomiting occurs * Rapid breathing occurs * You become worse CARE ADVICE given per Diabetes - High Blood Sugar (Adult) guideline. GO TO ED NOW: * You need to be seen in the Emergency Department. * Go to the ED at ___________ Hospital. * Leave now. Drive carefully. * Another adult should drive. CARE ADVICE given per Breathing Difficulty (Adult) guideline.  Referrals Warm transfer to backline GO TO FACILITY OTHER - SPECIFY

## 2023-09-08 NOTE — Telephone Encounter (Signed)
FYI pt transferred to triage for high blood glucose levels

## 2023-09-09 ENCOUNTER — Ambulatory Visit: Payer: Medicare HMO | Admitting: Family

## 2023-09-09 NOTE — Progress Notes (Deleted)
Patient ID: [name] [name], female    DOB: 01/25/1951, 72 y.o.   MRN: [id number]  No chief complaint on file.           Assessment & Plan:   Subjective:    Outpatient Medications Prior to Visit  Medication Sig Dispense Refill   Accu-Chek FastClix Lancets MISC USE TO TEST UP TO FOUR TIMES DAILY AS DIRECTED 306 each 2   ACCU-CHEK GUIDE test strip USE AS DIRECTED 200 strip 3   albuterol (ACCUNEB) 1.25 MG/3ML nebulizer solution TAKE 3 MLS BY NEBULIZATION THREE TIMES DAILY AS NEEDED FOR WHEEZING 75 mL 2   albuterol (VENTOLIN HFA) 108 (90 Base) MCG/ACT inhaler INHALE 2 PUFFS INTO THE LUNGS EVERY 6 HOURS AS NEEDED FOR WHEEZING OR SHORTNESS OF BREATH 6.7 g 1   ALPRAZolam (XANAX) 0.5 MG tablet TAKE 1 TABLET(0.5 MG) BY MOUTH TWICE DAILY AS NEEDED FOR ANXIETY 60 tablet 2   aspirin 81 MG tablet Take 81 mg by mouth daily.     atorvastatin (LIPITOR) 10 MG tablet TAKE 1 TABLET(10 MG) BY MOUTH DAILY 90 tablet 3   blood glucose meter kit and supplies KIT Dispense based on patient and insurance preference. Use up to four times daily as directed. DX E11.8 1 each 0   Budeson-Glycopyrrol-Formoterol (BREZTRI AEROSPHERE) 160-9-4.8 MCG/ACT AERO Inhale 2 puffs into the lungs 2 (two) times daily. 32.1 g 3   Calcium Carb-Cholecalciferol (CALCIUM 500 + D3 PO) Take by mouth.     furosemide (LASIX) 20 MG tablet Taking 3 times a week (Patient taking differently: Taking 1 times a week) 90 tablet 1   glipiZIDE (GLUCOTROL XL) 5 MG 24 hr tablet Take 1 tablet (5 mg total) by mouth daily with breakfast. 90 tablet 0   INVOKANA 100 MG TABS tablet Take 100 mg by mouth every morning.     ipratropium (ATROVENT) 0.06 % nasal spray USE 2 SPRAYS IN EACH NOSTRIL FOUR TIMES DAILY AS DIRECTED 45 mL 1   ipratropium-albuterol (DUONEB) 0.5-2.5 (3) MG/3ML SOLN USE 3 ML VIA NEBULIZER EVERY 6 HOURS AS NEEDED 360 mL 2   isosorbide mononitrate (IMDUR) 60 MG 24 hr tablet TAKE 1 TABLET(60 MG) BY MOUTH DAILY 90 tablet 1   levothyroxine (SYNTHROID)  100 MCG tablet TAKE 1 TABLET(100 MCG) BY MOUTH DAILY 90 tablet 1   meloxicam (MOBIC) 7.5 MG tablet TAKE 1 TABLET(7.5 MG) BY MOUTH DAILY 90 tablet 1   Menthol, Topical Analgesic, (BIOFREEZE) 4 % GEL Apply topically.     Olopatadine HCl 0.2 % SOLN Apply 1 drop to eye 2 (two) times daily. 2.5 mL 0   pantoprazole (PROTONIX) 40 MG tablet TAKE 1 TABLET(40 MG) BY MOUTH DAILY 90 tablet 1   vitamin B-12 (CYANOCOBALAMIN) 1000 MCG tablet Take 1,000 mcg by mouth daily.     No facility-administered medications prior to visit.   Past Medical History:  Diagnosis Date   Bronchitis, asthmatic    Coronary artery disease    BMS to LAD 02/2008   Diabetes mellitus without complication (HCC)    Hyperlipidemia    Hypertensive heart disease 04/16/2016   Hypothyroidism    MI, old    Morbid obesity (HCC) 04/16/2016   S/P coronary artery stent placement 04/16/2016   BMS to LAD 2009   Tobacco abuse 04/16/2016   Past Surgical History:  Procedure Laterality Date   ABDOMINAL HYSTERECTOMY     35; very heavy bleeding   ANKLE SURGERY Right    CHOLECYSTECTOMY     CORONARY  ANGIOPLASTY  02/2008   bare metal stent to LAD    CORONARY STENT PLACEMENT     KNEE ARTHROSCOPY     OOPHORECTOMY Left    age 38   ROTATOR CUFF REPAIR Bilateral    THORACIC OUTLET SURGERY     WRIST SURGERY     Allergies  Allergen Reactions   Clindamycin/Lincomycin Other (See Comments)    Head ache    Codeine Hives   Cortisone    Dilaudid [Hydromorphone Hcl] Nausea And Vomiting   Iodides    Reglan [Metoclopramide] Other (See Comments)    Other reaction(s): Dystonia Hallucinations/Violent   Tramadol    Trulicity [Dulaglutide] Other (See Comments)    Severe constipation   Tape Rash    Blisters       Objective:    Physical Exam Vitals and nursing note reviewed.  Constitutional:      Appearance: Normal appearance. She is ill-appearing.     Interventions: Face mask in place.  HENT:     Right Ear: Tympanic membrane and ear canal  normal.     Left Ear: Tympanic membrane and ear canal normal.     Nose:     Right Sinus: Frontal sinus tenderness present.     Left Sinus: Frontal sinus tenderness present.     Mouth/Throat:     Mouth: Mucous membranes are moist.     Pharynx: Posterior oropharyngeal erythema present. No pharyngeal swelling, oropharyngeal exudate or uvula swelling.     Tonsils: No tonsillar exudate or tonsillar abscesses.  Cardiovascular:     Rate and Rhythm: Normal rate and regular rhythm.  Pulmonary:     Effort: Pulmonary effort is normal.     Breath sounds: Normal breath sounds.  Musculoskeletal:        General: Normal range of motion.  Lymphadenopathy:     Head:     Right side of head: No preauricular or posterior auricular adenopathy.     Left side of head: No preauricular or posterior auricular adenopathy.     Cervical: No cervical adenopathy.  Skin:    General: Skin is warm and dry.  Neurological:     Mental Status: She is alert.  Psychiatric:        Mood and Affect: Mood normal.        Behavior: Behavior normal.    LMP  (LMP Unknown)  Wt Readings from Last 3 Encounters:  07/08/23 296 lb 12.8 oz (134.6 kg)  05/29/23 291 lb 9.6 oz (132.3 kg)  03/25/23 294 lb 6.4 oz (133.5 kg)       Dulce Sellar, NP

## 2023-09-10 ENCOUNTER — Other Ambulatory Visit: Payer: Medicare HMO | Admitting: *Deleted

## 2023-09-10 ENCOUNTER — Ambulatory Visit: Payer: Self-pay

## 2023-09-10 NOTE — Patient Outreach (Signed)
  Care Coordination   09/10/2023 Name: KRISNA DEGEUS MRN: [id number] DOB: 10/12/1950   Care Coordination Outreach Attempts:  An unsuccessful outreach was attempted for an appointment today.  Follow Up Plan:  Additional outreach attempts will be made to offer the patient complex care management information and services.   Encounter Outcome:  No Answer   Care Coordination Interventions:  No, not indicated    Lannette Avellino Idelle Jo, RN, MSN RN Care Manager [facility], Population [facility] Direct Dial: [phone]  Fax: [phone] Website: [email or web address]

## 2023-09-19 ENCOUNTER — Other Ambulatory Visit (HOSPITAL_BASED_OUTPATIENT_CLINIC_OR_DEPARTMENT_OTHER): Payer: Self-pay | Admitting: Cardiovascular Disease

## 2023-09-21 ENCOUNTER — Telehealth: Payer: Self-pay | Admitting: Physician Assistant

## 2023-09-21 ENCOUNTER — Telehealth: Payer: Self-pay

## 2023-09-21 ENCOUNTER — Ambulatory Visit (INDEPENDENT_AMBULATORY_CARE_PROVIDER_SITE_OTHER): Payer: Medicare HMO | Admitting: Physician Assistant

## 2023-09-21 ENCOUNTER — Encounter: Payer: Self-pay | Admitting: Physician Assistant

## 2023-09-21 ENCOUNTER — Other Ambulatory Visit: Payer: Self-pay

## 2023-09-21 VITALS — BP 114/60 | HR 89 | Temp 97.2°F | Ht 65.0 in | Wt 296.4 lb

## 2023-09-21 DIAGNOSIS — E039 Hypothyroidism, unspecified: Secondary | ICD-10-CM

## 2023-09-21 DIAGNOSIS — Z7985 Long-term (current) use of injectable non-insulin antidiabetic drugs: Secondary | ICD-10-CM

## 2023-09-21 DIAGNOSIS — E785 Hyperlipidemia, unspecified: Secondary | ICD-10-CM | POA: Diagnosis not present

## 2023-09-21 DIAGNOSIS — R0902 Hypoxemia: Secondary | ICD-10-CM

## 2023-09-21 DIAGNOSIS — E1169 Type 2 diabetes mellitus with other specified complication: Secondary | ICD-10-CM | POA: Diagnosis not present

## 2023-09-21 DIAGNOSIS — J441 Chronic obstructive pulmonary disease with (acute) exacerbation: Secondary | ICD-10-CM

## 2023-09-21 DIAGNOSIS — N183 Chronic kidney disease, stage 3 unspecified: Secondary | ICD-10-CM | POA: Diagnosis not present

## 2023-09-21 DIAGNOSIS — E1165 Type 2 diabetes mellitus with hyperglycemia: Secondary | ICD-10-CM | POA: Diagnosis not present

## 2023-09-21 LAB — POCT GLYCOSYLATED HEMOGLOBIN (HGB A1C): Hemoglobin A1C: 8.1 % — AB (ref 4.0–5.6)

## 2023-09-21 LAB — CBC WITH DIFFERENTIAL/PLATELET
Basophils Absolute: 0 10*3/uL (ref 0.0–0.1)
Basophils Relative: 0.8 % (ref 0.0–3.0)
Eosinophils Absolute: 0.1 10*3/uL (ref 0.0–0.7)
Eosinophils Relative: 2.2 % (ref 0.0–5.0)
HCT: 52 % — ABNORMAL HIGH (ref 36.0–46.0)
Hemoglobin: 16.8 g/dL — ABNORMAL HIGH (ref 12.0–15.0)
Lymphocytes Relative: 17.4 % (ref 12.0–46.0)
Lymphs Abs: 1.1 10*3/uL (ref 0.7–4.0)
MCHC: 32.4 g/dL (ref 30.0–36.0)
MCV: 97.5 fL (ref 78.0–100.0)
Monocytes Absolute: 0.6 10*3/uL (ref 0.1–1.0)
Monocytes Relative: 8.6 % (ref 3.0–12.0)
Neutro Abs: 4.6 10*3/uL (ref 1.4–7.7)
Neutrophils Relative %: 71 % (ref 43.0–77.0)
Platelets: 152 10*3/uL (ref 150.0–400.0)
RBC: 5.33 Mil/uL — ABNORMAL HIGH (ref 3.87–5.11)
RDW: 15.7 % — ABNORMAL HIGH (ref 11.5–15.5)
WBC: 6.4 10*3/uL (ref 4.0–10.5)

## 2023-09-21 LAB — COMPREHENSIVE METABOLIC PANEL
ALT: 17 U/L (ref 0–35)
AST: 18 U/L (ref 0–37)
Albumin: 4 g/dL (ref 3.5–5.2)
Alkaline Phosphatase: 99 U/L (ref 39–117)
BUN: 21 mg/dL (ref 6–23)
CO2: 28 meq/L (ref 19–32)
Calcium: 9.9 mg/dL (ref 8.4–10.5)
Chloride: 101 meq/L (ref 96–112)
Creatinine, Ser: 1.2 mg/dL (ref 0.40–1.20)
GFR: 45.94 mL/min — ABNORMAL LOW (ref >60.00)
Glucose, Bld: 139 mg/dL — ABNORMAL HIGH (ref 70–99)
Potassium: 4.4 meq/L (ref 3.5–5.1)
Sodium: 139 meq/L (ref 135–145)
Total Bilirubin: 0.9 mg/dL (ref 0.2–1.2)
Total Protein: 6.7 g/dL (ref 6.0–8.3)

## 2023-09-21 LAB — MICROALBUMIN / CREATININE URINE RATIO
Creatinine,U: 104 mg/dL
Microalb Creat Ratio: 7.8 mg/g (ref 0.0–30.0)
Microalb, Ur: 8.2 mg/dL — ABNORMAL HIGH (ref 0.0–1.9)

## 2023-09-21 LAB — LIPID PANEL
Cholesterol: 130 mg/dL (ref 0–200)
HDL: 44 mg/dL (ref >39.00)
LDL Cholesterol: 46 mg/dL (ref 0–99)
NonHDL: 85.81
Total CHOL/HDL Ratio: 3
Triglycerides: 201 mg/dL — ABNORMAL HIGH (ref 0.0–149.0)
VLDL: 40.2 mg/dL — ABNORMAL HIGH (ref 0.0–40.0)

## 2023-09-21 LAB — TSH: TSH: 12.88 u[IU]/mL — ABNORMAL HIGH (ref 0.35–5.50)

## 2023-09-21 MED ORDER — MELOXICAM 7.5 MG PO TABS: 7.5000 mg | ORAL_TABLET | Freq: Every day | ORAL | 1 refills | Status: DC

## 2023-09-21 MED ORDER — LEVOFLOXACIN 500 MG PO TABS: 500.0000 mg | ORAL_TABLET | Freq: Every day | ORAL | 0 refills | Status: AC

## 2023-09-21 MED ORDER — GLIPIZIDE ER 5 MG PO TB24: 5.0000 mg | ORAL_TABLET | Freq: Every day | ORAL | 3 refills | Status: DC

## 2023-09-21 NOTE — Patient Instructions (Signed)
You need to reconsider seeking emergency care due to your respiratory condition.   As you have refused ER treatment at this time, We will attempt the following: -Try to get Oxygen for at home -Levaquin 500 mg 1 full tab on first day, then 1/2 tab daily for the remaining 6 days -Breztri -Rescue albuterol nebs every 4-6 hours -Fluids -Stop smoking  We are going to take a look at your labs today as well.

## 2023-09-21 NOTE — Telephone Encounter (Signed)
New order placed with [facility] being OON; please advise if anything further needed.

## 2023-09-21 NOTE — Telephone Encounter (Signed)
Copied from CRM [id number]. Topic: General - Other >> Sep 21, 2023 11:05 AM Adele Barthel wrote: Reason for CRM: Pt has appt at 12:30 PM with Dr. Doloris Hall. She is currently ill and wanted to notify the doctor that she is still coming to her appt, and just wanted to give her doctor notice.   Patient already seen and examined during visit. Alyssa is aware of this note.

## 2023-09-21 NOTE — Telephone Encounter (Signed)
Returned pt call and lvm advising PCP recommendations with office cb number

## 2023-09-21 NOTE — Telephone Encounter (Signed)
Copied from CRM [id number]. Topic: Clinical - Medication Question >> Sep 21, 2023  1:45 PM Almira Coaster wrote: Reason for CRM: Patient was seen at the office today with Alyssa Allwardt and wanted to know if there was any way to send cough medicine as well as the antibiotics as her cough becoming really bad.  Please see pt call note and advise regarding cough medications

## 2023-09-21 NOTE — Progress Notes (Signed)
Patient ID: [name] [name], female    DOB: 10-Jul-1951, 72 y.o.   MRN: [id number]   Assessment & Plan:  Hypoxia -     For home use only DME oxygen -     CBC with Differential/Platelet -     Comprehensive metabolic panel  COPD with acute exacerbation (HCC) -     For home use only DME oxygen -     CBC with Differential/Platelet -     Comprehensive metabolic panel  Stage 3 chronic kidney disease, unspecified whether stage 3a or 3b CKD (HCC) -     CBC with Differential/Platelet -     Comprehensive metabolic panel  Hyperlipidemia associated with type 2 diabetes mellitus (HCC), on Lipitor -     Lipid panel  Hypothyroidism (acquired), stable on Levothyroxine -     TSH  Type 2 diabetes mellitus with hyperglycemia, without long-term current use of insulin (HCC) -     Microalbumin / creatinine urine ratio -     POCT glycosylated hemoglobin (Hb A1C) -     glipiZIDE ER; Take 1 tablet (5 mg total) by mouth daily with breakfast.  Dispense: 90 tablet; Refill: 3  Other orders -     levoFLOXacin; Take 1 tablet (500 mg total) by mouth daily for 7 days.  Dispense: 7 tablet; Refill: 0 -     Meloxicam; Take 1 tablet (7.5 mg total) by mouth daily.  Dispense: 90 tablet; Refill: 1    Assessment and Plan    Acute Respiratory Distress Likely pneumonia given recent exposure, cough, and hypoxemia. Patient is refusing to go to the ER due to caregiving responsibilities of her husband. -Start Levaquin 500mg  for the first day, then 250mg  for the next six days. -Order home oxygen and instruct patient on its use. -Check labs today to assess white count and kidney function. -Advise patient to seek immediate medical attention if condition worsens.  O2 Sats today: Room air sitting: 90% Room air walking: 86% 2L Oxygen sitting: 96% 2L Oxygen walking: 90%; after a bump to 3L, she had 96% walking with O2   Chronic Obstructive Pulmonary Disease (COPD) Patient reports decreased smoking and plans to use  nicotine patches. Currently using Breztri, albuterol, and a nebulizer. -Continue current COPD management. -Encourage continued smoking cessation and use of nicotine patches. -Follow with pulmonology - reviewed plan from 07/08/23 Dr. Wynona Neat  Type 2 Diabetes Mellitus Recent A1c was 8.1. Patient is on Glipizide and Invokana. -Hold Glipizide while on Levaquin due to risk of hypoglycemia. -Check labs today to monitor blood glucose levels. -She needs to consider Insulin & we had this talk again today   Lab Results  Component Value Date   HGBA1C 8.1 (A) 09/21/2023   HGBA1C 7.9 (A) 05/29/2023   HGBA1C 7.3 (A) 01/02/2023     Arthritis Patient reports needing a refill of Meloxicam. -Refill Meloxicam prescription.    Return in about 3 months (around 12/20/2023) for recheck/follow-up.    Subjective:    Chief Complaint  Patient presents with   Medical Management of Chronic Issues    Pt in office for 3 mon f/u; pt due for urine uACR at visit today; pt states she called before visit stating she has been coughing since Thursday, sinus issues; sneezing; clear mucus; and SOB but nothing notes on appt notes to indicate; pt husband had pneumonia 3 wks ago     HPI Discussed the use of AI scribe software for clinical note transcription with the patient, who  gave verbal consent to proceed.  History of Present Illness   [name], a patient with a history of COPD, presents with a week-long history of respiratory symptoms, including difficulty breathing and coughing. She reports that her symptoms have worsened over the past few days, with severe coughing causing rib pain. She has been self-medicating with DayQuil, which she reports has somewhat calmed her coughing. She also mentions experiencing difficulty breathing when walking short distances, such as from the waiting room to the examination room.  [name] also reports that her partner recently had double pneumonia, and she has been in close contact  with him during his illness. She has not been out of the house in over a week, and she denies having any exposure to other people who might be sick. Despite this, she acknowledges that her chances of contracting pneumonia are high given her close contact with her partner.  [name] has a history of smoking but reports that she has significantly reduced her intake over the past two days. She plans to get nicotine patches through her insurance to further aid in smoking cessation.  In addition to her respiratory symptoms, [name] also mentions that she has been feeling overwhelmed recently due to various personal issues, including a fine for her aggressive dog and the death of her brother. She believes that these stressors may have contributed to her current illness.       Past Medical History:  Diagnosis Date   Bronchitis, asthmatic    Coronary artery disease    BMS to LAD 02/2008   Diabetes mellitus without complication (HCC)    Hyperlipidemia    Hypertensive heart disease 04/16/2016   Hypothyroidism    MI, old    Morbid obesity (HCC) 04/16/2016   S/P coronary artery stent placement 04/16/2016   BMS to LAD 2009   Tobacco abuse 04/16/2016    Past Surgical History:  Procedure Laterality Date   ABDOMINAL HYSTERECTOMY     35; very heavy bleeding   ANKLE SURGERY Right    CHOLECYSTECTOMY     CORONARY ANGIOPLASTY  02/2008   bare metal stent to LAD    CORONARY STENT PLACEMENT     KNEE ARTHROSCOPY     OOPHORECTOMY Left    age 37   ROTATOR CUFF REPAIR Bilateral    THORACIC OUTLET SURGERY     WRIST SURGERY      Family History  Problem Relation Age of Onset   Cancer Mother    Diabetes Son    Hypertension Son     Social History   Tobacco Use   Smoking status: Every Day    Current packs/day: 0.25    Average packs/day: 0.3 packs/day for 31.0 years (7.8 ttl pk-yrs)    Types: Cigarettes   Smokeless tobacco: Never   Tobacco comments:    1 PPD  Vaping Use   Vaping status: Never Used   Substance Use Topics   Alcohol use: No   Drug use: No     Allergies  Allergen Reactions   Clindamycin/Lincomycin Other (See Comments)    Head ache    Codeine Hives   Cortisone    Dilaudid [Hydromorphone Hcl] Nausea And Vomiting   Iodides    Reglan [Metoclopramide] Other (See Comments)    Other reaction(s): Dystonia Hallucinations/Violent   Tramadol    Trulicity [Dulaglutide] Other (See Comments)    Severe constipation   Tape Rash    Blisters     Review of Systems NEGATIVE UNLESS OTHERWISE INDICATED IN HPI  Objective:     BP 114/60 (BP Location: Left Arm, Patient Position: Sitting, Cuff Size: Large)   Pulse 89   Temp (!) 97.2 F (36.2 C) (Temporal)   Ht 5\' 5"  (1.651 m)   Wt 296 lb 6.4 oz (134.4 kg)   LMP  (LMP Unknown)   SpO2 94%   BMI 49.32 kg/m   Wt Readings from Last 3 Encounters:  09/21/23 296 lb 6.4 oz (134.4 kg)  07/08/23 296 lb 12.8 oz (134.6 kg)  05/29/23 291 lb 9.6 oz (132.3 kg)    BP Readings from Last 3 Encounters:  09/21/23 114/60  07/08/23 112/66  05/29/23 130/70     Physical Exam Vitals and nursing note reviewed.  Constitutional:      Appearance: Normal appearance. She is obese.  Eyes:     Extraocular Movements: Extraocular movements intact.     Conjunctiva/sclera: Conjunctivae normal.     Pupils: Pupils are equal, round, and reactive to light.  Cardiovascular:     Rate and Rhythm: Normal rate and regular rhythm.     Pulses: Normal pulses.     Heart sounds: No murmur heard. Pulmonary:     Breath sounds: Decreased air movement present. Decreased breath sounds and wheezing present.     Comments: Pauses for breath while talking to me Musculoskeletal:     Right shoulder: No tenderness, bony tenderness or crepitus. Normal range of motion. Normal strength.  Skin:    Comments: Bluish-colored nose  Neurological:     General: No focal deficit present.     Mental Status: She is alert and oriented to person, place, and time.   Psychiatric:        Mood and Affect: Mood normal.           Time Spent: 47 minutes of total time was spent on the date of the encounter performing the following actions: chart review prior to seeing the patient, obtaining history, performing a medically necessary exam, counseling on the treatment plan, placing orders, and documenting in our EHR.       Faydra Korman M Saint Hank, PA-C

## 2023-09-21 NOTE — Telephone Encounter (Signed)
Copied from CRM [id number]. Topic: Clinical - Home Health Verbal Orders >> Sep 21, 2023  4:20 PM Joanette Gula wrote: Caller/Agency: Allyson Sabal Callback Number: [phone] opt# 1 Service Requested: states that the oxygen order is out of network with them

## 2023-09-24 ENCOUNTER — Telehealth: Payer: Self-pay | Admitting: Physician Assistant

## 2023-09-24 ENCOUNTER — Ambulatory Visit: Payer: Self-pay | Admitting: Physician Assistant

## 2023-09-24 ENCOUNTER — Other Ambulatory Visit: Payer: Self-pay | Admitting: Physician Assistant

## 2023-09-24 ENCOUNTER — Telehealth: Payer: Self-pay

## 2023-09-24 MED ORDER — LEVOTHYROXINE SODIUM 112 MCG PO TABS: 112.0000 ug | ORAL_TABLET | Freq: Every day | ORAL | 1 refills | Status: DC

## 2023-09-24 NOTE — Telephone Encounter (Signed)
 I could not add this message to the triage nurse note, Pt called back and states the order has to have the litters on it Please advise.

## 2023-09-24 NOTE — Telephone Encounter (Signed)
 Spoke with E2C2 nurse and patient and advised orders, OV notes and all info needed was sent to [facility] this morning via fax. Confirmed with Nat at [facility] pt insurance was accepted with them and also received confirmation fax was sent successfully. Advised patient phone number for [facility] to call and check the status of oxygen  order. Pt verbalized understanding

## 2023-09-24 NOTE — Telephone Encounter (Signed)
 Chief Complaint: follow up, hasn't received home oxygen  yet Symptoms: SOB, lightheaded when SOB, headaches Frequency: x 1 week, improving since Monday Pertinent Negatives: Patient denies fever, chest pain Disposition: [] ED /[] Urgent Care (no appt availability in office) / [] Appointment(In office/virtual)/ []  [facility]/ [] Home Care/ [] Refused Recommended Disposition /[] [facility]/ []  Follow-up with PCP Additional Notes: Patient seen in office 09/21/23 and she states she was supposed to received home oxygen . Patient requesting follow up as she has not received it. Patient states her SOB has improved but is still moderate. Called CAL line for office and transferred patient to [facility] regarding updated on home oxygen  status.   Copied from CRM [id number]. Topic: Clinical - Red Word Triage >> Sep 24, 2023  3:45 PM Isabell A wrote: [facility] that prompted transfer to Nurse Triage: Trouble breathing, while coughing or walking Reason for Disposition  [1] MODERATE difficulty breathing (e.g., speaks in phrases, SOB even at rest, pulse 100-120) AND [2] NEW-onset or WORSE than normal  Answer Assessment - Initial Assessment Questions 1. RESPIRATORY STATUS: Describe your breathing? (e.g., wheezing, shortness of breath, unable to speak, severe coughing)      Patient states she starts wheezing and getting SOB with walking around the house.  2. ONSET: When did this breathing problem begin?      Patient states she feels her SOB and difficulty breathing has gotten better since her office visit on 09/21/23 but states she is having a productive cough.  3. PATTERN Does the difficult breathing come and go, or has it been constant since it started?      Worse with coughing really hard.  4. SEVERITY: How bad is your breathing? (e.g., mild, moderate, severe)    - MILD: No SOB at rest, mild SOB with walking, speaks normally in sentences, can lie down, no retractions, pulse < 100.    -  MODERATE: SOB at rest, SOB with minimal exertion and prefers to sit, cannot lie down flat, speaks in phrases, mild retractions, audible wheezing, pulse 100-120.    - SEVERE: Very SOB at rest, speaks in single words, struggling to breathe, sitting hunched forward, retractions, pulse > 120      It's iffy. It's not really bad or really good either. Patient reports SOB while at rest talking.  5. RECURRENT SYMPTOM: Have you had difficulty breathing before? If Yes, ask: When was the last time? and What happened that time?      Patient states she has not had this severe of an episode of SOB.  6. CARDIAC HISTORY: Do you have any history of heart disease? (e.g., heart attack, angina, bypass surgery, angioplasty)      Cardiac stents, 2 heart attacks.  7. LUNG HISTORY: Do you have any history of lung disease?  (e.g., pulmonary embolus, asthma, emphysema)     Pt states she thinks has COPD.  8. CAUSE: What do you think is causing the breathing problem?      I guess I've got an upper respiratory infection  9. OTHER SYMPTOMS: Do you have any other symptoms? (e.g., dizziness, runny nose, cough, chest pain, fever)     Productive cough, wheezing, headaches.  10. O2 SATURATION MONITOR:  Do you use an oxygen  saturation monitor (pulse oximeter) at home? If Yes, ask: What is your reading (oxygen  level) today? What is your usual oxygen  saturation reading? (e.g., 95%)       Denies.  11. TRAVEL: Have you traveled out of the country in the last month? (  e.g., travel history, exposures)       Denies.  Protocols used: Breathing Difficulty-A-AH

## 2023-09-24 NOTE — Patient Instructions (Addendum)
 Visit Information  Thank you for taking time to visit with me today. Please don't hesitate to contact me if I can be of assistance to you.   Following are the goals we discussed today:   Goals Addressed             This Visit's Progress    Health Management- Diabetes and COPD       Care Coordination Interventions: Provided patient with basic written and verbal COPD education on self care/management/and exacerbation prevention Advised patient to track and manage COPD triggers Provided written and verbal instructions on pursed lip breathing and utilized returned demonstration as teach back Advised patient to self assesses COPD action plan zone and make appointment with provider if in the yellow zone for 48 hours without improvement Provided education to patient about basic DM disease process Reviewed medications with patient and discussed importance of medication adherence  Spoke with patient.  She reports she has been sick with a respiratory infection but is some better-on antibiotics.  She reports that she was supposed to get oxygen  but still does not have it and she gets winded.  Messaged to physician office concerning oxygen  orders. Discussed signs of worsening and COPD management.  She verbalized understanding.  Blood sugar management continues with last blood sugar 137.           Our next appointment is by telephone on 10/14/23 at 1000 am  Please call the care guide team at [phone] if you need to cancel or reschedule your appointment.   If you are experiencing a Mental Health or Behavioral Health Crisis or need someone to talk to, please call the Suicide and Crisis Lifeline: 988   The patient verbalized understanding of instructions, educational materials, and care plan provided today and DECLINED offer to receive copy of patient instructions, educational materials, and care plan.   The patient has been provided with contact information for the care management team and has  been advised to call with any health related questions or concerns.   Trevious Rampey J Viktoriya Glaspy, RN, MSN RN Care Manager [facility], Population Health Direct Dial: [phone]  Fax: [phone] Website: [email or web address]

## 2023-09-25 ENCOUNTER — Ambulatory Visit: Payer: Self-pay | Admitting: Physician Assistant

## 2023-09-25 NOTE — Telephone Encounter (Signed)
 Called [employer]. They provided the following DME that are in network:  [facility] [phone] (10 miles away) [facility] [phone] (0 miles away) [facility] [phone] (70 miles away)  [facility] [phone] (90 miles away)

## 2023-09-25 NOTE — Telephone Encounter (Signed)
 Patient called in stating she spoke with the Oxygen  Company this morning and they are unable to deliver her the oxygen  due to the physician's office not specifying how many liter's of oxygen  she is on with the prescription that was sent over. Patient is stating the representative advised the PCP to fax over a prescription stating the number of liters of oxygen  the patient requires so that the oxygen  can be delivered asap. This RN asked if patient was given any further information on where to fax information to and she stated she does not have any further information. Advised patient I will route this to provider high priority.  Copied from CRM [id number]. Topic: Clinical - Prescription Issue >> Sep 25, 2023 11:08 AM Cynthia K wrote: Reason for CRM: [facility] needs the liter of the air flow for the oxygen  that was order. She needs this really fast because she has breathing problems.

## 2023-09-25 NOTE — Telephone Encounter (Signed)
 Please see message and and correct order or advise liters of oxygen for patient

## 2023-09-25 NOTE — Telephone Encounter (Signed)
 [facility] called and stated they need provider's signature and date beside liter flow that was added on yesterday to order. Any time changes are made, signature is required. Please refax when done. Thanks

## 2023-09-25 NOTE — Telephone Encounter (Signed)
 Noted. This is being taken care of.

## 2023-09-25 NOTE — Telephone Encounter (Signed)
 Order faxed with ALL specifics on patient oxygen needed. Confirmation received [facility]

## 2023-09-25 NOTE — Telephone Encounter (Signed)
 Spoke with [employer] at [facility]  ([facility]) was advised since new order sent with written O2 instructions provider will need to sign beside and date for today and refax. Completed and faxed again. Received confirmation fax was successful. [place] advised they would get patient taken care of today.

## 2023-09-28 ENCOUNTER — Other Ambulatory Visit: Payer: Self-pay

## 2023-09-28 DIAGNOSIS — E039 Hypothyroidism, unspecified: Secondary | ICD-10-CM

## 2023-09-28 NOTE — Telephone Encounter (Signed)
 Orders were previously faxed to [facility] for patient and no further action needed at this time

## 2023-09-29 ENCOUNTER — Telehealth: Payer: Self-pay

## 2023-09-29 NOTE — Telephone Encounter (Signed)
 PAP: Patient assistance application for INVOKANA  has been approved by PAP Companies: [employer] from 09/23/2023 to 09/21/2024. Medication should be delivered to PAP Delivery: Home For further shipping updates, please contact [employer]) at [phone] Pt ID is: NO ID  PLEASE BE ADVISED LETTER OF APPROVAL HAS BEEN SCANNED INTO MEDIA OF CHART

## 2023-10-01 ENCOUNTER — Ambulatory Visit: Payer: Self-pay | Admitting: Physician Assistant

## 2023-10-01 ENCOUNTER — Other Ambulatory Visit: Payer: Self-pay

## 2023-10-01 ENCOUNTER — Other Ambulatory Visit: Payer: Self-pay | Admitting: Physician Assistant

## 2023-10-01 ENCOUNTER — Telehealth: Payer: Self-pay

## 2023-10-01 MED ORDER — FLUCONAZOLE 150 MG PO TABS: 150.0000 mg | ORAL_TABLET | Freq: Every day | ORAL | 0 refills | Status: AC

## 2023-10-01 NOTE — Telephone Encounter (Signed)
 Pt made aware of Alyssa's recommendations and expressed clear understanding.

## 2023-10-01 NOTE — Telephone Encounter (Signed)
 Pt states that she was prescribed Levofloxacin  by Mardy, pt took her last dose on Tuesday 09/29/23. Pt now states that today she is having severe vaginal itching, no other sxs and is requesting a new antibiotic Diflucan , one pill that she can take now and another pill a couple days later. Pt would like a c/b as soon as possible, states that she is very raw due to all the itching. Levofloxacin  helped with some sxs of the yeast infection, not the itching. I informed pt Alyssa is seeing pts this morning and that we would get back with her as soon as we could.

## 2023-10-01 NOTE — Telephone Encounter (Signed)
 Copied from CRM [id number]. Topic: Clinical - Medical Advice >> Oct 01, 2023 10:07 AM [name] [name] wrote: Reason for CRM: Patient called to advise that she needs more of an antibiotic. She did not remember the name of it. Didn't see any antibiotics listed amongst her meds. Patient advised that she has active symptoms, including severe itching that is disruptive to her daily life.   Please assist by EOB if possible. Provided turnaround time. >> Oct 01, 2023 10:19 AM Nurse Franklin B wrote: Pt needs to be triaged.          Reason for Disposition . Caller has already spoken with the PCP and has no further questions.  Protocols used: No Contact or Duplicate Contact Call-A-AH

## 2023-10-01 NOTE — Telephone Encounter (Signed)
 Please see message.

## 2023-10-05 ENCOUNTER — Other Ambulatory Visit: Payer: Self-pay | Admitting: Physician Assistant

## 2023-10-05 DIAGNOSIS — R600 Localized edema: Secondary | ICD-10-CM

## 2023-10-07 ENCOUNTER — Ambulatory Visit: Payer: Medicare HMO | Admitting: Pulmonary Disease

## 2023-10-14 ENCOUNTER — Telehealth: Payer: Self-pay

## 2023-10-14 ENCOUNTER — Ambulatory Visit: Payer: Self-pay

## 2023-10-14 NOTE — Patient Outreach (Signed)
  Care Coordination   10/14/2023 Name: [name] [name] MRN: [id number] DOB: 18-Dec-1950   Care Coordination Outreach Attempts:  An unsuccessful outreach was attempted for an appointment today.  Follow Up Plan:  Additional outreach attempts will be made to offer the patient complex care management information and services.   Encounter Outcome:  No Answer   Care Coordination Interventions:  No, not indicated    Bary Leriche RN, MSN [facility]  [facility], [facility] RN Care Manager Direct Dial: [phone]  Fax: [phone] Website: [email or web address]

## 2023-10-14 NOTE — Patient Outreach (Signed)
  Care Coordination   Follow Up Visit Note   10/14/2023 Name: [name] [name] MRN: [id number] DOB: 29-Jul-1951  [name] [name] is a 73 y.o. year old female who sees Allwardt, Crist Infante, PA-C for primary care. I spoke with  [name] [name] by phone today.  What matters to the patients health and wellness today?  Maintain health    Goals Addressed             This Visit's Progress    Health Management- Diabetes and COPD       Care Coordination Interventions: Provided patient with basic written and verbal COPD education on self care/management/and exacerbation prevention Advised patient to track and manage COPD triggers Provided written and verbal instructions on pursed lip breathing and utilized returned demonstration as teach back Advised patient to self assesses COPD action plan zone and make appointment with provider if in the yellow zone for 48 hours without improvement Provided education to patient about basic DM disease process Reviewed medications with patient and discussed importance of medication adherence  Spoke with patient.  She is upset as she had to surrender her dog.  She is using her oxygen at times when she gets winded. Otherwise her breathing is okay.  Reviewed COPD management.  She verbalized understanding.  Blood sugar  at 140.  Encouraged diabetes management.  No concerns.            SDOH assessments and interventions completed:  Yes  SDOH Interventions Today    Flowsheet Row Most Recent Value  SDOH Interventions   Food Insecurity Interventions Intervention Not Indicated  Housing Interventions Intervention Not Indicated  Transportation Interventions Intervention Not Indicated  Utilities Interventions Intervention Not Indicated  Social Connections Interventions Intervention Not Indicated  Health Literacy Interventions Intervention Not Indicated        Care Coordination Interventions:  Yes, provided   Follow up plan: Follow up call scheduled for February     Encounter Outcome:  Patient Visit Completed   Bary Leriche RN, MSN [facility], [facility] RN Care Manager Direct Dial: [phone]  Fax: [phone] Website: [email or web address]

## 2023-10-14 NOTE — Patient Instructions (Signed)
Visit Information  Thank you for taking time to visit with me today. Please don't hesitate to contact me if I can be of assistance to you.   Following are the goals we discussed today:   Goals Addressed             This Visit's Progress    Health Management- Diabetes and COPD       Care Coordination Interventions: Provided patient with basic written and verbal COPD education on self care/management/and exacerbation prevention Advised patient to track and manage COPD triggers Provided written and verbal instructions on pursed lip breathing and utilized returned demonstration as teach back Advised patient to self assesses COPD action plan zone and make appointment with provider if in the yellow zone for 48 hours without improvement Provided education to patient about basic DM disease process Reviewed medications with patient and discussed importance of medication adherence  Spoke with patient.  She is upset as she had to surrender her dog.  She is using her oxygen at times when she gets winded. Otherwise her breathing is okay.  Reviewed COPD management.  She verbalized understanding.  Blood sugar  at 140.  Encouraged diabetes management.  No concerns.            Our next appointment is by telephone on 11/11/23 at 100 pm  Please call the care guide team at [phone] if you need to cancel or reschedule your appointment.   If you are experiencing a Mental Health or Behavioral Health Crisis or need someone to talk to, please call the Suicide and Crisis Lifeline: 988   The patient verbalized understanding of instructions, educational materials, and care plan provided today and DECLINED offer to receive copy of patient instructions, educational materials, and care plan.   The patient has been provided with contact information for the care management team and has been advised to call with any health related questions or concerns.   Bary Leriche RN, MSN [facility], [facility] RN Care Manager Direct Dial: [phone]  Fax: [phone] Website: [email or web address]

## 2023-10-15 ENCOUNTER — Other Ambulatory Visit: Payer: Self-pay

## 2023-10-15 ENCOUNTER — Other Ambulatory Visit: Payer: Self-pay | Admitting: Pharmacist

## 2023-10-15 DIAGNOSIS — E1169 Type 2 diabetes mellitus with other specified complication: Secondary | ICD-10-CM

## 2023-10-15 DIAGNOSIS — J449 Chronic obstructive pulmonary disease, unspecified: Secondary | ICD-10-CM

## 2023-10-15 DIAGNOSIS — E1165 Type 2 diabetes mellitus with hyperglycemia: Secondary | ICD-10-CM

## 2023-10-15 DIAGNOSIS — E119 Type 2 diabetes mellitus without complications: Secondary | ICD-10-CM

## 2023-10-15 MED ORDER — BREZTRI AEROSPHERE 160-9-4.8 MCG/ACT IN AERO: 2.0000 | INHALATION_SPRAY | Freq: Two times a day (BID) | RESPIRATORY_TRACT | 3 refills | Status: DC

## 2023-10-15 NOTE — Progress Notes (Signed)
10/15/2023 Name: [name] [name] MRN: [id number] DOB: Jan 21, 1951  Chief Complaint  Patient presents with   Medication Management   Diabetes    [name] [name] is a 73 y.o. year old female who presented for a telephone visit.   They were referred to the pharmacist by their PCP for assistance in managing medication access  Subjective:  Care Team: Primary Care Provider: Allwardt, Crist Infante, PA-C ; Next Scheduled Visit: 11/16/2023 Cardiologist: Dr [facility]; Next Scheduled Visit: recall planned for 01/2024 Pulmonologist - Dr Wynona Neat; Next Scheduled Visit: 11/26/2023  Medication Access/Adherence  Patient reports affordability concerns with their medications: Yes  - She had Medicare LIS / Extra Help thru 01/20/2023. Patient received a letter from [employer] stating that she was no longer eligible for LIS after addition of her husband's VA benefits to monthly income.   Applied for [place] and [place] to get Markus Daft - patient was approved thru 09/22/2023. Patient reports she has 3 inhalers left.  Applied for [employer] medication assistance program for Theodis Sato - was approved 04/28/2023. Patient reports she has 3 bottles left.  W  Patient reports access/transportation concerns to their pharmacy: No   Patient reports adherence concerns with their medications:  No     Diabetes:  Current medications: Invokana 100mg  daily; glipizide ER 5mg  daily   Medications tried in the past: metformin - stopped due to decreased in renal function; Trulicity Ozempic - caused severe constipation and stomach pain; Tradgenta 5mg  - stopped due to cost; Invokana 300mg  - dose adjusted for renal function.   This morning blood glucose was 140; highest 271 - not sure what caused this - had caramel rice cake;  Lowest - 120  Trying to cut back on food.  Exercise - none currently due to concerns with being around people - flu and RSV risk; In summer exercises in the pool  Annual Eye Exam: not yet in 2024. She  is saving to have cataract surgery (copay per eye is about $140). She doesn't want to have eye exam until after cataract surgery.    Hyperlipidemia/ASCVD Risk Reduction  Current lipid lowering medications: atorvastatin 10mg  daily   Antiplatelet regimen: aspirin 81mg  daily   ASCVD History: CAD  Risk Factors: Type 2 DM; age   COPD:  Current medications: Breztri 2 puffs twice a day (started around 04/03/2023) Rescue inhaler use? Uses about 2 times a day about 2 or 3 days per week. Usually when she is wheezing. Started supplemental oxygen therapy - 2L (3L was too much)   Feels that breathing has improved since she started oxygen.    Has had 2 exacerbations in the past year (took round of prednisone 12/2022 and 03/25/2023)  Smoking Cessation:  - usually about 6 cigarettes per day.  - Contemplating quitting. She is planning to try patches she has been successful in past with patches.  - Tried Chantix in past - cause her to be "crazy"   Objective:  Lab Results  Component Value Date   HGBA1C 8.1 (A) 09/21/2023    Lab Results  Component Value Date   CREATININE 1.20 09/21/2023   BUN 21 09/21/2023   NA 139 09/21/2023   K 4.4 09/21/2023   CL 101 09/21/2023   CO2 28 09/21/2023    Lab Results  Component Value Date   CHOL 130 09/21/2023   HDL 44.00 09/21/2023   LDLCALC 46 09/21/2023   LDLDIRECT 60.0 05/16/2019   TRIG 201.0 (H) 09/21/2023   CHOLHDL 3 09/21/2023  Medications Reviewed Today     Reviewed by Henrene Pastor, RPH-CPP (Pharmacist) on 10/15/23 at 1111  Med List Status: <None>   Medication Order Taking? Sig Documenting Provider Last Dose Status Informant  Accu-Chek FastClix Lancets MISC [id number]  USE TO TEST UP TO FOUR TIMES DAILY AS DIRECTED Orland Mustard, MD  Active   ACCU-CHEK GUIDE test strip [id number]  USE AS DIRECTED Allwardt, Crist Infante, PA-C  Active   albuterol (ACCUNEB) 1.25 MG/3ML nebulizer solution [id number] Yes TAKE 3 MLS BY NEBULIZATION THREE  TIMES DAILY AS NEEDED FOR WHEEZING Allwardt, Alyssa M, PA-C Taking Active   albuterol (VENTOLIN HFA) 108 (90 Base) MCG/ACT inhaler [id number] Yes INHALE 2 PUFFS INTO THE LUNGS EVERY 6 HOURS AS NEEDED FOR WHEEZING OR SHORTNESS OF BREATH Allwardt, Alyssa M, PA-C Taking Active   ALPRAZolam (XANAX) 0.5 MG tablet [id number] Yes TAKE 1 TABLET(0.5 MG) BY MOUTH TWICE DAILY AS NEEDED FOR ANXIETY Allwardt, Alyssa M, PA-C Taking Active   aspirin 81 MG tablet [id number]  Take 81 mg by mouth daily. [provider]  Active Self  atorvastatin (LIPITOR) 10 MG tablet [id number]  TAKE 1 TABLET(10 MG) BY MOUTH DAILY Chilton Si, MD  Active   blood glucose meter kit and supplies KIT [id number]  Dispense based on patient and insurance preference. Use up to four times daily as directed. DX E11.8 Allwardt, Crist Infante, PA-C  Active   Budeson-Glycopyrrol-Formoterol (BREZTRI AEROSPHERE) 160-9-4.8 MCG/ACT AERO [id number] Yes Inhale 2 puffs into the lungs 2 (two) times daily. Allwardt, Crist Infante, PA-C Taking Active            Med Note Haze Justin   Fri Apr 10, 2023 11:46 AM) [place] and [place] Program thru 09/22/2023  Calcium Carb-Cholecalciferol (CALCIUM 500 + D3 PO) [id number]  Take by mouth. [provider]  Active   furosemide (LASIX) 20 MG tablet [id number]  TAKE AS DIRECTED THREE TIMES WEEK Allwardt, Alyssa M, PA-C  Active   glipiZIDE (GLUCOTROL XL) 5 MG 24 hr tablet [id number] Yes Take 1 tablet (5 mg total) by mouth daily with breakfast. Allwardt, Crist Infante, PA-C Taking Active   INVOKANA 100 MG TABS tablet [id number] Yes Take 100 mg by mouth every morning. [provider] Taking Active            Med Note Clydie Braun, Tradarius Reinwald B   Thu Oct 15, 2023  7:18 AM) Approved for [employer] medication assistance program thru 09/21/2024  ipratropium (ATROVENT) 0.06 % nasal spray [id number] Yes USE 2 SPRAYS IN EACH NOSTRIL FOUR TIMES DAILY AS DIRECTED Allwardt, Alyssa M, PA-C Taking Active   ipratropium-albuterol (DUONEB)  0.5-2.5 (3) MG/3ML SOLN [id number]  USE 3 ML VIA NEBULIZER EVERY 6 HOURS AS NEEDED Allwardt, Alyssa M, PA-C  Active   isosorbide mononitrate (IMDUR) 60 MG 24 hr tablet [id number]  TAKE 1 TABLET(60 MG) BY MOUTH DAILY Chilton Si, MD  Active   levothyroxine (SYNTHROID) 112 MCG tablet [id number] Yes Take 1 tablet (112 mcg total) by mouth daily. Allwardt, Crist Infante, PA-C Taking Active   meloxicam (MOBIC) 7.5 MG tablet [id number]  Take 1 tablet (7.5 mg total) by mouth daily. Allwardt, Crist Infante, PA-C  Active   Menthol, Topical Analgesic, (BIOFREEZE) 4 % GEL [id number]  Apply topically. [provider]  Active   Olopatadine HCl 0.2 % SOLN [id number]  Apply 1 drop to eye 2 (two) times daily. Allwardt, Crist Infante, PA-C  Active   pantoprazole (PROTONIX) 40 MG tablet [id number]  TAKE 1 TABLET(40 MG) BY MOUTH  DAILY Allwardt, Alyssa M, PA-C  Active   vitamin B-12 (CYANOCOBALAMIN) 1000 MCG tablet [id number]  Take 1,000 mcg by mouth daily. [provider]  Active             SDOH Interventions Today    Flowsheet Row Most Recent Value  SDOH Interventions   Physical Activity Interventions Patient Declined  [discussed Silver Sneakers and other forms of exercise]       Assessment/Plan:   Diabetes: Last A1c has increased.  - Reviewed goal A1c, goal fasting, and goal 2 hour post prandial glucose - Continue Invokana 100mg  daily (adjusted based on eGFR) and glipizide ER 5mg  daily.  - Patient to see PCP in February - options for better blood glucose control - Mounjaro - patient is hesitant because she had constipation / abdominal pain with Ozempic and Trulicity but I think it could be a good option for her. Cost might be an issues because the first fill would be $297 due to her deductible but then rest of year would be $47 - increased glipizide ER to 10mg  or 5mg  twice a day - Could try increasing Invokana since her renal function is improved but may not get much more blood glucose lowering with  eGFR close to 45.  - Recommend to check glucose 1 to 2 times per day  - Reviewed her 2025 [employer] benefits - patient has a deductible to meet for 2025  -[employer] ([id number]) $ 250 deductible  - Called [place] and [place] medication assistance program. Patient has been "conditionally approved" for 2025 to receive Breztri form [place] and [place] but she still needs to call them to complete re-enrollment. Again provided pt with phone number to call [phone]. They also report that they will need an updated prescription for Mrs. Fogelman. Will request from PCP.   Hyperlipidemia/ASCVD Risk Reduction: LDL at goal of < 55; Triglycerides slightly above goal of < 150. - Recommend to continue atorvastatin 10mg  daily.  COPD: 3 exacerbations in the last year but improved over the last 3 months - Continue Breztri - inhale 2 puffs into lungs twice a day - Continue Mucinex up to 1200mg  twice a day - Discussed smoking cessation - patient is considering retrial of nicotine patches but is not ready to quit yet.   Follow 2 months   Henrene Pastor, PharmD Clinical Pharmacist [facility]

## 2023-10-20 ENCOUNTER — Other Ambulatory Visit: Payer: Self-pay | Admitting: Physician Assistant

## 2023-10-31 ENCOUNTER — Other Ambulatory Visit: Payer: Self-pay | Admitting: Physician Assistant

## 2023-10-31 DIAGNOSIS — J4541 Moderate persistent asthma with (acute) exacerbation: Secondary | ICD-10-CM

## 2023-11-02 ENCOUNTER — Telehealth: Payer: Self-pay

## 2023-11-02 NOTE — Telephone Encounter (Signed)
 PAP: Patient assistance application for Breztri through [employer] (AZ&Me) has been mailed to pt's home address on file. Provider portion of application will be faxed to provider's office.

## 2023-11-10 ENCOUNTER — Encounter: Payer: Self-pay | Admitting: Physician Assistant

## 2023-11-10 ENCOUNTER — Ambulatory Visit (INDEPENDENT_AMBULATORY_CARE_PROVIDER_SITE_OTHER): Payer: Medicare HMO | Admitting: Physician Assistant

## 2023-11-10 VITALS — BP 115/78 | HR 81 | Temp 98.3°F | Ht 65.0 in | Wt 287.0 lb

## 2023-11-10 DIAGNOSIS — R051 Acute cough: Secondary | ICD-10-CM | POA: Diagnosis not present

## 2023-11-10 DIAGNOSIS — J449 Chronic obstructive pulmonary disease, unspecified: Secondary | ICD-10-CM | POA: Diagnosis not present

## 2023-11-10 MED ORDER — PREDNISONE 20 MG PO TABS: 20.0000 mg | ORAL_TABLET | Freq: Two times a day (BID) | ORAL | 0 refills | Status: DC

## 2023-11-10 MED ORDER — IPRATROPIUM-ALBUTEROL 0.5-2.5 (3) MG/3ML IN SOLN: 3.0000 mL | Freq: Four times a day (QID) | RESPIRATORY_TRACT | 2 refills | Status: DC | PRN

## 2023-11-10 MED ORDER — LEVOFLOXACIN 500 MG PO TABS: 500.0000 mg | ORAL_TABLET | Freq: Every day | ORAL | 0 refills | Status: AC

## 2023-11-10 NOTE — Telephone Encounter (Signed)
Received Provider port. Of App. Waiting on Patient to return app.

## 2023-11-10 NOTE — Progress Notes (Signed)
[name] [name] is a 73 y.o. female here for a new problem.  History of Present Illness:   Chief Complaint  Patient presents with   Sinus Problem    Pt c/o nasal congestion, coughing and expectorating yellow/green sputum. Started on Friday. Denies fever or chills.     Upper Respirator Infection   Patient complains of a nasal congestion accompanied by a cough and yellow/green sputum that has persisted since this past Thursday.  She's typically on oxygen as needed for her COPD. Denies checking her oxygen levels at home.  States that she's often susceptible to upper respiratory infections. Also report compliance and good tolerance of daily [employer]. Albuterol and nebulizer as needed. She is requesting Duoneb refill today.   Denies any chills or fevers.  Patient states her husband has been recently diagnosed with pneumonia. Covid and flu tests were negative as well.   Past Medical History:  Diagnosis Date   Bronchitis, asthmatic    Coronary artery disease    BMS to LAD 02/2008   Diabetes mellitus without complication (HCC)    Hyperlipidemia    Hypertensive heart disease 04/16/2016   Hypothyroidism    MI, old    Morbid obesity (HCC) 04/16/2016   S/P coronary artery stent placement 04/16/2016   BMS to LAD 2009   Tobacco abuse 04/16/2016     Social History   Tobacco Use   Smoking status: Every Day    Current packs/day: 0.25    Average packs/day: 0.3 packs/day for 31.0 years (7.8 ttl pk-yrs)    Types: Cigarettes   Smokeless tobacco: Never   Tobacco comments:    1 PPD  Vaping Use   Vaping status: Never Used  Substance Use Topics   Alcohol use: No   Drug use: No    Past Surgical History:  Procedure Laterality Date   ABDOMINAL HYSTERECTOMY     35; very heavy bleeding   ANKLE SURGERY Right    CHOLECYSTECTOMY     CORONARY ANGIOPLASTY  02/2008   bare metal stent to LAD    CORONARY STENT PLACEMENT     KNEE ARTHROSCOPY     OOPHORECTOMY Left    age 60   ROTATOR CUFF  REPAIR Bilateral    THORACIC OUTLET SURGERY     WRIST SURGERY      Family History  Problem Relation Age of Onset   Cancer Mother    Diabetes Son    Hypertension Son     Allergies  Allergen Reactions   Clindamycin/Lincomycin Other (See Comments)    Head ache    Codeine Hives   Cortisone    Dilaudid [Hydromorphone Hcl] Nausea And Vomiting   Iodides    Reglan [Metoclopramide] Other (See Comments)    Other reaction(s): Dystonia Hallucinations/Violent   Tramadol    Trulicity [Dulaglutide] Other (See Comments)    Severe constipation   Tape Rash    Blisters     Current Medications:   Current Outpatient Medications:    Accu-Chek FastClix Lancets MISC, USE TO TEST UP TO FOUR TIMES DAILY AS DIRECTED, Disp: 306 each, Rfl: 2   ACCU-CHEK GUIDE test strip, USE AS DIRECTED, Disp: 200 strip, Rfl: 3   albuterol (ACCUNEB) 1.25 MG/3ML nebulizer solution, TAKE 3 MLS BY NEBULIZATION THREE TIMES DAILY AS NEEDED FOR WHEEZING, Disp: 75 mL, Rfl: 2   albuterol (VENTOLIN HFA) 108 (90 Base) MCG/ACT inhaler, INHALE 2 PUFFS INTO THE LUNGS EVERY 6 HOURS AS NEEDED FOR WHEEZING OR SHORTNESS OF BREATH, Disp: 6.7  g, Rfl: 1   ALPRAZolam (XANAX) 0.5 MG tablet, TAKE 1 TABLET(0.5 MG) BY MOUTH TWICE DAILY AS NEEDED FOR ANXIETY, Disp: 60 tablet, Rfl: 2   aspirin 81 MG tablet, Take 81 mg by mouth daily., Disp: , Rfl:    atorvastatin (LIPITOR) 10 MG tablet, TAKE 1 TABLET(10 MG) BY MOUTH DAILY, Disp: 90 tablet, Rfl: 3   blood glucose meter kit and supplies KIT, Dispense based on patient and insurance preference. Use up to four times daily as directed. DX E11.8, Disp: 1 each, Rfl: 0   Budeson-Glycopyrrol-Formoterol (BREZTRI AEROSPHERE) 160-9-4.8 MCG/ACT AERO, Inhale 2 puffs into the lungs 2 (two) times daily., Disp: 32.1 g, Rfl: 3   Calcium Carb-Cholecalciferol (CALCIUM 500 + D3 PO), Take by mouth., Disp: , Rfl:    furosemide (LASIX) 20 MG tablet, TAKE AS DIRECTED THREE TIMES WEEK, Disp: 90 tablet, Rfl: 0   glipiZIDE  (GLUCOTROL XL) 5 MG 24 hr tablet, Take 1 tablet (5 mg total) by mouth daily with breakfast., Disp: 90 tablet, Rfl: 3   INVOKANA 100 MG TABS tablet, Take 100 mg by mouth every morning., Disp: , Rfl:    ipratropium (ATROVENT) 0.06 % nasal spray, USE 2 SPRAYS IN EACH NOSTRIL FOUR TIMES DAILY AS DIRECTED, Disp: 45 mL, Rfl: 1   isosorbide mononitrate (IMDUR) 60 MG 24 hr tablet, TAKE 1 TABLET(60 MG) BY MOUTH DAILY, Disp: 90 tablet, Rfl: 1   levofloxacin (LEVAQUIN) 500 MG tablet, Take 1 tablet (500 mg total) by mouth daily for 7 days., Disp: 7 tablet, Rfl: 0   levothyroxine (SYNTHROID) 112 MCG tablet, Take 1 tablet (112 mcg total) by mouth daily., Disp: 30 tablet, Rfl: 1   meloxicam (MOBIC) 7.5 MG tablet, Take 1 tablet (7.5 mg total) by mouth daily., Disp: 90 tablet, Rfl: 1   Menthol, Topical Analgesic, (BIOFREEZE) 4 % GEL, Apply topically., Disp: , Rfl:    Olopatadine HCl 0.2 % SOLN, Apply 1 drop to eye 2 (two) times daily., Disp: 2.5 mL, Rfl: 0   pantoprazole (PROTONIX) 40 MG tablet, TAKE 1 TABLET(40 MG) BY MOUTH DAILY, Disp: 90 tablet, Rfl: 1   predniSONE (DELTASONE) 20 MG tablet, Take 1 tablet (20 mg total) by mouth 2 (two) times daily with a meal., Disp: 5 tablet, Rfl: 0   vitamin B-12 (CYANOCOBALAMIN) 1000 MCG tablet, Take 1,000 mcg by mouth daily., Disp: , Rfl:    ipratropium-albuterol (DUONEB) 0.5-2.5 (3) MG/3ML SOLN, Take 3 mLs by nebulization every 6 (six) hours as needed., Disp: 360 mL, Rfl: 2   Review of Systems:   Review of Systems  Constitutional:  Negative for chills and fever.  HENT:  Positive for congestion and sinus pain.   Respiratory:  Positive for cough and sputum production.   Negative unless otherwise specified per HPI.  Vitals:   Vitals:   11/10/23 1139  BP: 115/78  Pulse: 81  Temp: 98.3 F (36.8 C)  TempSrc: Temporal  SpO2: 97%  Weight: 287 lb (130.2 kg)  Height: 5\' 5"  (1.651 m)     Body mass index is 47.76 kg/m.  Physical Exam:   Physical Exam Vitals and  nursing note reviewed.  Constitutional:      General: She is not in acute distress.    Appearance: She is well-developed. She is not ill-appearing or toxic-appearing.  HENT:     Head: Normocephalic and atraumatic.     Right Ear: Tympanic membrane, ear canal and external ear normal. Tympanic membrane is not erythematous, retracted or bulging.  Left Ear: Tympanic membrane, ear canal and external ear normal. Tympanic membrane is not erythematous, retracted or bulging.     Nose: Nose normal.     Right Sinus: No maxillary sinus tenderness or frontal sinus tenderness.     Left Sinus: No maxillary sinus tenderness or frontal sinus tenderness.     Mouth/Throat:     Pharynx: Uvula midline. No posterior oropharyngeal erythema.  Eyes:     General: Lids are normal.     Conjunctiva/sclera: Conjunctivae normal.  Neck:     Trachea: Trachea normal.  Cardiovascular:     Rate and Rhythm: Normal rate and regular rhythm.     Heart sounds: Normal heart sounds, S1 normal and S2 normal.  Pulmonary:     Effort: Pulmonary effort is normal.     Breath sounds: Wheezing and rales present. No decreased breath sounds or rhonchi.  Lymphadenopathy:     Cervical: No cervical adenopathy.  Skin:    General: Skin is warm and dry.  Neurological:     Mental Status: She is alert.  Psychiatric:        Speech: Speech normal.        Behavior: Behavior normal. Behavior is cooperative.     Assessment and Plan:   COPD GOLD 0/ prob AB still smoking; Acute cough No red flags on exam.   Suspect poss copd exac Start levaquin and oral prednisone 20 mg daily x 5 days (conservative regimen due to DM)  Discussed taking medications as prescribed.  Reviewed return precautions including new or worsening fever, SOB, new or worsening cough or other concerns.  Push fluids and rest.  I recommend that patient follow-up if symptoms worsen or persist despite treatment x 7-10 days, sooner if needed.   Jarold Motto,  PA-C  I,Safa M Kadhim,acting as a scribe for Jarold Motto, PA.,have documented all relevant documentation on the behalf of Jarold Motto, PA,as directed by  Jarold Motto, PA while in the presence of Jarold Motto, [place].   I, Jarold Motto, [place], have reviewed all documentation for this visit. The documentation on 11/10/23 for the exam, diagnosis, procedures, and orders are all accurate and complete.

## 2023-11-10 NOTE — Patient Instructions (Signed)
It was great to see you!  Start levaquin and low dose of prednisone  If any worsening, please go to the ER!   Let's follow-up with Alyssa on Monday to recheck lungs, as you are already scheduled to do, sooner if you have concerns.  Take care,  Jarold Motto PA-C

## 2023-11-11 ENCOUNTER — Ambulatory Visit: Payer: Self-pay

## 2023-11-11 NOTE — Patient Outreach (Signed)
  Care Coordination   Follow Up Visit Note   11/11/2023 Name: [name] [name] MRN: [id number] DOB: [birth year]  [name] [name] is a 73 y.o. year old female who sees Allwardt, Crist Infante, PA-C for primary care. I spoke with  [name] [name] by phone today.  What matters to the patients health and wellness today?  Getting over pneumonia    Goals Addressed             This Visit's Progress    Health Management- Diabetes and COPD       Care Coordination Interventions: Provided patient with basic written and verbal COPD education on self care/management/and exacerbation prevention Advised patient to track and manage COPD triggers Provided written and verbal instructions on pursed lip breathing and utilized returned demonstration as teach back Advised patient to self assesses COPD action plan zone and make appointment with provider if in the yellow zone for 48 hours without improvement Provided education to patient about basic DM disease process Reviewed medications with patient and discussed importance of medication adherence  Spoke with patient.  She and her husband have pneumonia.  She saw PCP yesterday.  On antibiotics and steroids.  She states she feels much better.  Discussed signs of worsening pneumonia and when to notify physician.  She has follow up on 2-24 with PCP.  She states the prednisone has increased her blood sugar but she is managing with her medication. Most recent sugar 120.  Encouraged continued diabetes management despite prednisone on board.  She verbalized understanding.  No concerns.            SDOH assessments and interventions completed:  Yes     Care Coordination Interventions:  Yes, provided   Follow up plan: Follow up call scheduled for March 3rd    Encounter Outcome:  Patient Visit Completed   Bary Leriche RN, MSN [facility]  [facility], [facility] RN Care Manager Direct Dial: [phone]  Fax: [phone] Website: [email or web address]

## 2023-11-11 NOTE — Patient Instructions (Signed)
Visit Information  Thank you for taking time to visit with me today. Please don't hesitate to contact me if I can be of assistance to you.   Following are the goals we discussed today:   Goals Addressed             This Visit's Progress    Health Management- Diabetes and COPD       Care Coordination Interventions: Provided patient with basic written and verbal COPD education on self care/management/and exacerbation prevention Advised patient to track and manage COPD triggers Provided written and verbal instructions on pursed lip breathing and utilized returned demonstration as teach back Advised patient to self assesses COPD action plan zone and make appointment with provider if in the yellow zone for 48 hours without improvement Provided education to patient about basic DM disease process Reviewed medications with patient and discussed importance of medication adherence  Spoke with patient.  She and her husband have pneumonia.  She saw PCP yesterday.  On antibiotics and steroids.  She states she feels much better.  Discussed signs of worsening pneumonia and when to notify physician.  She has follow up on 2-24 with PCP.  She states the prednisone has increased her blood sugar but she is managing with her medication. Most recent sugar 120.  Encouraged continued diabetes management despite prednisone on board.  She verbalized understanding.  No concerns.            Our next appointment is by telephone on 11/23/23 at 100 pm  Please call the care guide team at [phone] if you need to cancel or reschedule your appointment.   If you are experiencing a Mental Health or Behavioral Health Crisis or need someone to talk to, please call the Suicide and Crisis Lifeline: 988   The patient verbalized understanding of instructions, educational materials, and care plan provided today and DECLINED offer to receive copy of patient instructions, educational materials, and care plan.   The patient  has been provided with contact information for the care management team and has been advised to call with any health related questions or concerns.   Bary Leriche RN, MSN [facility] RN Care Manager Direct Dial: [phone]  Fax: [phone] Website: [email or web address]

## 2023-11-16 ENCOUNTER — Ambulatory Visit (INDEPENDENT_AMBULATORY_CARE_PROVIDER_SITE_OTHER): Payer: Medicare HMO | Admitting: Physician Assistant

## 2023-11-16 ENCOUNTER — Encounter: Payer: Self-pay | Admitting: Physician Assistant

## 2023-11-16 VITALS — BP 111/73 | HR 89 | Temp 97.8°F | Ht 65.0 in | Wt 293.8 lb

## 2023-11-16 DIAGNOSIS — J449 Chronic obstructive pulmonary disease, unspecified: Secondary | ICD-10-CM | POA: Diagnosis not present

## 2023-11-16 DIAGNOSIS — E039 Hypothyroidism, unspecified: Secondary | ICD-10-CM | POA: Diagnosis not present

## 2023-11-16 DIAGNOSIS — R0902 Hypoxemia: Secondary | ICD-10-CM | POA: Diagnosis not present

## 2023-11-16 LAB — TSH: TSH: 7.54 u[IU]/mL — ABNORMAL HIGH (ref 0.35–5.50)

## 2023-11-16 MED ORDER — IPRATROPIUM-ALBUTEROL 0.5-2.5 (3) MG/3ML IN SOLN: 3.0000 mL | Freq: Four times a day (QID) | RESPIRATORY_TRACT | 2 refills | Status: AC | PRN

## 2023-11-16 NOTE — Progress Notes (Signed)
 Patient ID: [name] [name], female    DOB: 1950-11-14, 73 y.o.   MRN: [id number]   Assessment & Plan:  Hypothyroidism (acquired) -     TSH  COPD GOLD 0/ prob AB still smoking  -     Ipratropium-Albuterol; Take 3 mLs by nebulization every 6 (six) hours as needed.  Dispense: 360 mL; Refill: 2  Hypoxia   Assessment and Plan    Respiratory Infection Recent pneumonia with ongoing productive cough and wheezing. Completed course of Levaquin and Prednisone with some improvement. Noted dry mouth possibly secondary to Levaquin. -Continue Breztri and Albuterol inhalers as needed. -Continue to monitor symptoms and seek medical attention if worsening. -Follow with pulmonology next month -Continue O2 use at home   Thyroid Dysfunction Elevated TSH in December, possibly contributing to insomnia and hair loss. -Check TSH today to assess current thyroid function.    Return in about 3 months (around 02/13/2024) for recheck/follow-up, fasting labs .    Subjective:    Chief Complaint  Patient presents with   Follow-up    Pt here for 6-8 week f/u w/o any concerns,     Diabetes   Hyperlipidemia   Asthma    HPI Discussed the use of AI scribe software for clinical note transcription with the patient, who gave verbal consent to proceed.  History of Present Illness   [name] [name] is a 73 year old female who presents with persistent cough and dry mouth. Her husband, who has also been experiencing respiratory issues, was diagnosed with pneumonia and is using her oxygen supply.  She has a persistent productive cough, bringing up sputum she describes as 'garbage.' The cough can lead to shortness of breath and choking. She uses oxygen intermittently, particularly when coughing exacerbates her breathing difficulties. She also uses Breztri and an albuterol inhaler, as well as albuterol solution in a nebulizer, which she uses twice daily. There is an issue with insurance regarding the refill of her  DuoNav treatment.  She experiences severe dry mouth, which she attributes to her current medication regimen, specifically Levaquin. She notes that her mouth has been 'severely dry,' which she suspects is due to Levaquin, as she has not experienced this side effect in the past. She is on her last day of Levaquin.  She has a history of elevated blood sugar levels, which she attributes to prednisone use. Her blood sugar spiked to 340 mg/dL after taking prednisone, and she managed it with glipizide. She adjusted her prednisone dosage to avoid further spikes.  She has a history of thyroid issues, with a high level noted in December, which she suspects may have contributed to her insomnia. She experiences hair loss and is scheduled for a thyroid check today.       Past Medical History:  Diagnosis Date   Bronchitis, asthmatic    Coronary artery disease    BMS to LAD 02/2008   Diabetes mellitus without complication (HCC)    Hyperlipidemia    Hypertensive heart disease 04/16/2016   Hypothyroidism    MI, old    Morbid obesity (HCC) 04/16/2016   S/P coronary artery stent placement 04/16/2016   BMS to LAD 2009   Tobacco abuse 04/16/2016    Past Surgical History:  Procedure Laterality Date   ABDOMINAL HYSTERECTOMY     35; very heavy bleeding   ANKLE SURGERY Right    CHOLECYSTECTOMY     CORONARY ANGIOPLASTY  02/2008   bare metal stent to LAD  CORONARY STENT PLACEMENT     KNEE ARTHROSCOPY     OOPHORECTOMY Left    age 57   ROTATOR CUFF REPAIR Bilateral    THORACIC OUTLET SURGERY     WRIST SURGERY      Family History  Problem Relation Age of Onset   Cancer Mother    Diabetes Son    Hypertension Son     Social History   Tobacco Use   Smoking status: Every Day    Current packs/day: 0.25    Average packs/day: 0.3 packs/day for 31.0 years (7.8 ttl pk-yrs)    Types: Cigarettes   Smokeless tobacco: Never   Tobacco comments:    1 PPD  Vaping Use   Vaping status: Never Used   Substance Use Topics   Alcohol use: No   Drug use: No     Allergies  Allergen Reactions   Clindamycin/Lincomycin Other (See Comments)    Head ache    Codeine Hives   Cortisone    Dilaudid [Hydromorphone Hcl] Nausea And Vomiting   Iodides    Reglan [Metoclopramide] Other (See Comments)    Other reaction(s): Dystonia Hallucinations/Violent   Tramadol    Trulicity [Dulaglutide] Other (See Comments)    Severe constipation   Tape Rash    Blisters     Review of Systems NEGATIVE UNLESS OTHERWISE INDICATED IN HPI      Objective:     BP 111/73   Pulse 89   Temp 97.8 F (36.6 C)   Ht 5\' 5"  (1.651 m)   Wt 293 lb 12.8 oz (133.3 kg)   LMP  (LMP Unknown)   SpO2 95%   BMI 48.89 kg/m   Wt Readings from Last 3 Encounters:  11/16/23 293 lb 12.8 oz (133.3 kg)  11/10/23 287 lb (130.2 kg)  09/21/23 296 lb 6.4 oz (134.4 kg)    BP Readings from Last 3 Encounters:  11/16/23 111/73  11/10/23 115/78  09/21/23 114/60     Physical Exam Vitals and nursing note reviewed.  Constitutional:      Appearance: Normal appearance. She is obese.  Eyes:     Extraocular Movements: Extraocular movements intact.     Conjunctiva/sclera: Conjunctivae normal.     Pupils: Pupils are equal, round, and reactive to light.  Cardiovascular:     Rate and Rhythm: Normal rate and regular rhythm.     Pulses: Normal pulses.     Heart sounds: No murmur heard. Pulmonary:     Breath sounds: No decreased air movement. Wheezing (throughout) present. No decreased breath sounds.  Musculoskeletal:     Right shoulder: No tenderness, bony tenderness or crepitus. Normal range of motion. Normal strength.  Skin:    Comments: Bluish-colored nose  Neurological:     General: No focal deficit present.     Mental Status: She is alert and oriented to person, place, and time.  Psychiatric:        Mood and Affect: Mood normal.        Kajuan Guyton M Nikea Settle, PA-C

## 2023-11-17 NOTE — Telephone Encounter (Signed)
 Reached out to Patient to ensure she received application and check on return status- left HIPAA compliant V/M

## 2023-11-18 ENCOUNTER — Other Ambulatory Visit: Payer: Self-pay | Admitting: Physician Assistant

## 2023-11-18 MED ORDER — LEVOTHYROXINE SODIUM 125 MCG PO TABS: 125.0000 ug | ORAL_TABLET | Freq: Every day | ORAL | 1 refills | Status: DC

## 2023-11-19 ENCOUNTER — Ambulatory Visit: Payer: Self-pay | Admitting: Physician Assistant

## 2023-11-19 ENCOUNTER — Other Ambulatory Visit: Payer: Self-pay

## 2023-11-19 DIAGNOSIS — E039 Hypothyroidism, unspecified: Secondary | ICD-10-CM

## 2023-11-19 NOTE — Telephone Encounter (Signed)
 Chief Complaint: high blood sugar Symptoms: dry mouth Frequency: Ongoing since being on prednisone, last dose last Wednesday Pertinent Negatives: Patient denies other symptoms Disposition: [] ED /[] Urgent Care (no appt availability in office) / [] Appointment(In office/virtual)/ []  [place] [facility]/ [] Home Care/ [] Refused Recommended Disposition /[] [place] [facility]/ [x]  Follow-up with PCP Additional Notes: Patient called and she says she forgot to mention to Alyssa about her blood sugars staying in the 200's. She says today her mouth was dry and she checked her blood sugar around 1400 and it was 241. She checked it while on the phone with NT and it was 239 around 1650. She says when she was taking a higher dose of invocana, her blood sugars were staying in the 100's. She wants to know if she can take 150 mg instead of the 100 mg prescribed. She also says she's taken an extra glipizide when her blood sugar is up and asked if it's ok to do that. Advised to take medications as prescribed daily in the morning. Advised I will send this to Alyssa and someone will call back tomorrow with her recommendation. Patient verbalized understanding.    Copied From CRM [id number]. Reason for Triage: Patient states she forgot to tell Alyssa at her appointment on Monday that her sugar is not staying down, as of right now it is 241 - wants to confirm the increase of dosage for her medication. Patient can't get her sugar under 240.    Reason for Disposition  [1] Caller has NON-URGENT medicine question about med that PCP prescribed AND [2] triager unable to answer question  Answer Assessment - Initial Assessment Questions 1. NAME of MEDICINE: "What medicine(s) are you calling about?"     Invocana 2. QUESTION: "What is your question?" (e.g., double dose of medicine, side effect)     I would like to know if it can be increased to 150 mg where she was before and doing better 3. PRESCRIBER: "Who prescribed the  medicine?" Reason: if prescribed by specialist, call should be referred to that group.     PCP 4. SYMPTOMS: "Do you have any symptoms?" If Yes, ask: "What symptoms are you having?"  "How bad are the symptoms (e.g., mild, moderate, severe)     Higher blood sugars, dry mouth  Protocols used: Medication Question Call-A-AH

## 2023-11-20 NOTE — Telephone Encounter (Signed)
 Please see Nurse note regarding patient and advise on medication dosage.

## 2023-11-20 NOTE — Telephone Encounter (Signed)
 Patient also asking if she can take 150 mg of her Invocana, States when taking that dose her glucose levels were better but since lower dose they have increased. Please advise

## 2023-11-20 NOTE — Telephone Encounter (Signed)
 Called pt and advised 10 mg Glipizide in mornings was ok per PCP; not to make any other changes with meds and dose until we discuss via telephone next Friday. Pt verbalized understanding

## 2023-11-23 ENCOUNTER — Ambulatory Visit: Payer: Self-pay

## 2023-11-23 NOTE — Patient Instructions (Signed)
 Visit Information  Thank you for taking time to visit with me today. Please don't hesitate to contact me if I can be of assistance to you.   Following are the goals we discussed today:   Goals Addressed             This Visit's Progress    Health Management- Diabetes and COPD       Care Coordination Interventions: Provided patient with basic written and verbal COPD education on self care/management/and exacerbation prevention Advised patient to track and manage COPD triggers Provided written and verbal instructions on pursed lip breathing and utilized returned demonstration as teach back Advised patient to self assesses COPD action plan zone and make appointment with provider if in the yellow zone for 48 hours without improvement Provided education to patient about basic DM disease process Reviewed medications with patient and discussed importance of medication adherence  Spoke with patient.  She reports she is getting over pneumonia but still has a cough but is better.  Discussed pneumonia recovery and cough.  She states her sugars are still up though.  She states that her physician increased her glipizide to 10 mg per day.  She is checking her sugars and ranging in the upper 100's and lower 200's.  She is supposed to check sugars regularly and report to physician office on Friday.  Encouraged patient to check sugars as advised and continue to limit sweets and carbohydrates. She verbalized understanding.  No concerns.            Our next appointment is by telephone on 12/09/23 at 100 pm  Please call the care guide team at [phone] if you need to cancel or reschedule your appointment.   If you are experiencing a Mental Health or Behavioral Health Crisis or need someone to talk to, please call the Suicide and Crisis Lifeline: 988   The patient verbalized understanding of instructions, educational materials, and care plan provided today and DECLINED offer to receive copy of patient  instructions, educational materials, and care plan.   The patient has been provided with contact information for the care management team and has been advised to call with any health related questions or concerns.   Bary Leriche RN, MSN [facility], [facility] Health RN Care Manager Direct Dial: [phone]  Fax: [phone] Website: [email or web address]

## 2023-11-23 NOTE — Patient Outreach (Signed)
 Care Coordination   Follow Up Visit Note   11/23/2023 Name: [name] [name] MRN: [id number] DOB: 11/12/1950  [name] [name] is a 73 y.o. year old female who sees Allwardt, Crist Infante, PA-C for primary care. I spoke with  [name] [name] by phone today.  What matters to the patients health and wellness today?  Getting her sugars down.     Goals Addressed             This Visit's Progress    Health Management- Diabetes and COPD       Care Coordination Interventions: Provided patient with basic written and verbal COPD education on self care/management/and exacerbation prevention Advised patient to track and manage COPD triggers Provided written and verbal instructions on pursed lip breathing and utilized returned demonstration as teach back Advised patient to self assesses COPD action plan zone and make appointment with provider if in the yellow zone for 48 hours without improvement Provided education to patient about basic DM disease process Reviewed medications with patient and discussed importance of medication adherence  Spoke with patient.  She reports she is getting over pneumonia but still has a cough but is better.  Discussed pneumonia recovery and cough.  She states her sugars are still up though.  She states that her physician increased her glipizide to 10 mg per day.  She is checking her sugars and ranging in the upper 100's and lower 200's.  She is supposed to check sugars regularly and report to physician office on Friday.  Encouraged patient to check sugars as advised and continue to limit sweets and carbohydrates. She verbalized understanding.  No concerns.            SDOH assessments and interventions completed:  Yes     Care Coordination Interventions:  Yes, provided   Follow up plan: Follow up call scheduled for 12/09/23    Encounter Outcome:  Patient Visit Completed   Bary Leriche RN, MSN   [facility], [facility] Health RN Care  Manager Direct Dial: [phone]  Fax: [phone] Website: [email or web address]

## 2023-11-25 NOTE — Telephone Encounter (Signed)
 PAP: Application for Markus Daft has been submitted to [employer] ([employer]), via fax

## 2023-11-26 ENCOUNTER — Ambulatory Visit: Payer: Medicare HMO | Admitting: Pulmonary Disease

## 2023-11-27 ENCOUNTER — Telehealth: Payer: Self-pay

## 2023-11-27 NOTE — Telephone Encounter (Signed)
 Spoke with patient to get an update on this week and glucose readings. Pt states been on medication regimen per PCP recommendations this week. Checking glucose readings am/pm with am readings ranging at 150 and pm readings ranging around 215. Pt feels that she done better with Invocana at 150mg  dose. Pt gets medication for free through program, please advise patient recommendations.

## 2023-11-30 ENCOUNTER — Other Ambulatory Visit: Payer: Self-pay

## 2023-11-30 ENCOUNTER — Ambulatory Visit: Payer: Self-pay | Admitting: Physician Assistant

## 2023-11-30 DIAGNOSIS — E1165 Type 2 diabetes mellitus with hyperglycemia: Secondary | ICD-10-CM

## 2023-11-30 NOTE — Telephone Encounter (Signed)
 PAP: Patient assistance application for [name] [name] has been approved by PAP Companies: [employer] from 11/25/2023 to 09/21/2024. Medication should be delivered to PAP Delivery: Home. For further shipping updates, please contact [employer] ([employer]) at [phone]. Patient ID is: PEP_ID# [id number]

## 2023-11-30 NOTE — Telephone Encounter (Signed)
See new phone note

## 2023-11-30 NOTE — Telephone Encounter (Signed)
 Spoke with patient; referral placed with patient permission and PCP suggestions for Endocrinology. Pt also advised to go to ED if not able to get glucose under control. Pt verbalized understanding.

## 2023-11-30 NOTE — Telephone Encounter (Signed)
 Called pt and advised PCP recommendations and pt agreeable for referral to Endo, and verbalized understanding for ED treatment if she can't get glucose under control.

## 2023-11-30 NOTE — Telephone Encounter (Signed)
 Please see triage note for patient; spoke with patient Friday to get report on recent glucose levels with 10mg  Glipizide in the am but now patient has had sudden spike in glucose. Please advise

## 2023-11-30 NOTE — Telephone Encounter (Signed)
 Chief Complaint: Increased blood sugar Symptoms: Dry mouth, frequent urination  Frequency: Constant  Pertinent Negatives: Patient denies chest pain, vomiting, rapid breathing   Disposition: [] ED /[] Urgent Care (no appt availability in office) / [] Appointment(In office/virtual)/ []  [facility] [facility]/ [] Home Care/ [] Refused Recommended Disposition /[] [place] [facility]/ [x]  Follow-up with PCP Additional Notes: Patient states her PCP change her diabetes medication and every since than she has had increased blood sugars. Patient states her usual range is 110-115 but it was 250 this morning at 0930. Patient states she has not missed any doses of her oral diabetes medication. Patient also called on Friday reporting evaluated blood sugars. Patient is requesting recommendations from PCP because she does not want to go back on insulin. Care advice was given and advised this message would be forwarded to PCP for additional recommendations. Patient expecting a callback.    Copied From CRM [id number]. Reason for Triage: Patients current blood sugar is 250. Requesting a call back from a nurse.    Reason for Disposition  [1] Blood glucose > 240 mg/dL (09.8 mmol/L) AND [1] pregnant  Answer Assessment - Initial Assessment Questions 1. BLOOD GLUCOSE: "What is your blood glucose level?"      250  2. ONSET: "When did you check the blood glucose?"     0930 3. USUAL RANGE: "What is your glucose level usually?" (e.g., usual fasting morning value, usual evening value)     100-115 4. KETONES: "Do you check for ketones (urine or blood test strips)?" If Yes, ask: "What does the test show now?"      N/A 5. TYPE 1 or 2:  "Do you know what type of diabetes you have?"  (e.g., Type 1, Type 2, Gestational; doesn't know)      Type 2  6. INSULIN: "Do you take insulin?" "What type of insulin(s) do you use? What is the mode of delivery? (syringe, pen; injection or pump)?"      No  7. DIABETES PILLS: "Do you take any  pills for your diabetes?" If Yes, ask: "Have you missed taking any pills recently?"     Yes, have not missed any medication 8. OTHER SYMPTOMS: "Do you have any symptoms?" (e.g., fever, frequent urination, difficulty breathing, dizziness, weakness, vomiting)     Dry mouth, frequent urination  Protocols used: Diabetes - High Blood Sugar-A-AH

## 2023-12-09 ENCOUNTER — Ambulatory Visit: Payer: Self-pay

## 2023-12-09 NOTE — Patient Instructions (Signed)
 Visit Information  Thank you for taking time to visit with me today. Please don't hesitate to contact me if I can be of assistance to you.   Following are the goals we discussed today:   Goals Addressed             This Visit's Progress    Health Management- Diabetes and COPD       Care Coordination Interventions: Provided patient with basic written and verbal COPD education on self care/management/and exacerbation prevention Advised patient to track and manage COPD triggers Provided written and verbal instructions on pursed lip breathing and utilized returned demonstration as teach back Advised patient to self assesses COPD action plan zone and make appointment with provider if in the yellow zone for 48 hours without improvement Provided education to patient about basic DM disease process Reviewed medications with patient and discussed importance of medication adherence  Spoke with patient.  She reports she is doing okay.  She reports being short winded some this morning when she came down stairs/ She used her oxygen briefly and was fine. She reports that her sugars are still up some.  Her PCP has referred to endocrinology but she has not heard anything.  CM sent message to PCP office.  Blood sugar presently is 255 and she has not eaten since this morning.  Advised to eat regularly and limit carbohydrates.  She verbalized understanding.            Our next appointment is by telephone on 12/24/23 at 100 pm  Please call the care guide team at [phone] if you need to cancel or reschedule your appointment.   If you are experiencing a Mental Health or Behavioral Health Crisis or need someone to talk to, please call the Suicide and Crisis Lifeline: 988   The patient verbalized understanding of instructions, educational materials, and care plan provided today and DECLINED offer to receive copy of patient instructions, educational materials, and care plan.   The patient has been  provided with contact information for the care management team and has been advised to call with any health related questions or concerns.   Bary Leriche RN, MSN [facility], [facility] Health RN Care Manager Direct Dial: [phone]  Fax: [phone] Website: [email or web address]

## 2023-12-09 NOTE — Patient Outreach (Signed)
 Care Coordination   Follow Up Visit Note   12/09/2023 Name: IMAAN PADGETT MRN: [id number] DOB: [birth year]  [name] is a 73 y.o. year old female who sees Allwardt, Crist Infante, PA-C for primary care. I spoke with  [name] by phone today.  What matters to the patients health and wellness today?  Blood sugar control    Goals Addressed             This Visit's Progress    Health Management- Diabetes and COPD       Care Coordination Interventions: Provided patient with basic written and verbal COPD education on self care/management/and exacerbation prevention Advised patient to track and manage COPD triggers Provided written and verbal instructions on pursed lip breathing and utilized returned demonstration as teach back Advised patient to self assesses COPD action plan zone and make appointment with provider if in the yellow zone for 48 hours without improvement Provided education to patient about basic DM disease process Reviewed medications with patient and discussed importance of medication adherence  Spoke with patient.  She reports she is doing okay.  She reports being short winded some this morning when she came down stairs/ She used her oxygen briefly and was fine. She reports that her sugars are still up some.  Her PCP has referred to endocrinology but she has not heard anything.  CM sent message to PCP office.  Blood sugar presently is 255 and she has not eaten since this morning.  Advised to eat regularly and limit carbohydrates.  She verbalized understanding.            SDOH assessments and interventions completed:  Yes     Care Coordination Interventions:  Yes, provided   Follow up plan: Follow up call scheduled for 4/3    Encounter Outcome:  Patient Visit Completed   Bary Leriche RN, MSN [facility]  [facility], [facility] Health RN Care Manager Direct Dial: [phone]  Fax: [phone] Website: [email or web address]

## 2023-12-10 ENCOUNTER — Telehealth: Payer: Self-pay

## 2023-12-10 NOTE — Patient Instructions (Signed)
 Visit Information  Thank you for taking time to visit with me today. Please don't hesitate to contact me if I can be of assistance to you.   Our next appointment is by telephone on 12/24/23 at 100 pm  Please call the care guide team at [phone] if you need to cancel or reschedule your appointment.   If you are experiencing a Mental Health or Behavioral Health Crisis or need someone to talk to, please call the Suicide and Crisis Lifeline: 988   The patient verbalized understanding of instructions, educational materials, and care plan provided today and DECLINED offer to receive copy of patient instructions, educational materials, and care plan.   The patient has been provided with contact information for the care management team and has been advised to call with any health related questions or concerns.   Bary Leriche RN, MSN [facility], [facility] Health RN Care Manager Direct Dial: [phone]  Fax: [phone] Website: [email or web address]

## 2023-12-10 NOTE — Patient Outreach (Signed)
 Care Coordination   Follow Up Visit Note   12/10/2023 Name: [name] [name] MRN: [id number] DOB: 05-15-1951  [name] [name] is a 73 y.o. year old female who sees Allwardt, Crist Infante, PA-C for primary care. I spoke with  [name] [name] by phone today. Spoke with patient about endocrinology referral.  Advised patient that the office will call to schedule. She verbalized understanding.  Office contact information given.     SDOH assessments and interventions completed:  Yes     Care Coordination Interventions:  Yes, provided   Follow up plan: Follow up call scheduled for 12/24/23    Encounter Outcome:  Patient Visit Completed   Bary Leriche RN, MSN [place]  [facility], [facility] Health RN Care Manager Direct Dial: [phone]  Fax: [phone] Website: [email or web address]

## 2023-12-24 ENCOUNTER — Ambulatory Visit: Payer: Self-pay

## 2023-12-24 NOTE — Patient Instructions (Signed)
 Visit Information  Thank you for taking time to visit with me today. Please don't hesitate to contact me if I can be of assistance to you.   Following are the goals we discussed today:   Goals Addressed             This Visit's Progress    Health Management- Diabetes and COPD       Care Coordination Interventions: Provided patient with basic written and verbal COPD education on self care/management/and exacerbation prevention Advised patient to track and manage COPD triggers Provided written and verbal instructions on pursed lip breathing and utilized returned demonstration as teach back Advised patient to self assesses COPD action plan zone and make appointment with provider if in the yellow zone for 48 hours without improvement Provided education to patient about basic DM disease process Reviewed medications with patient and discussed importance of medication adherence  Spoke with patient.  She reports she is doing better this week. She reports the end of last week she had some problems breathing where she needed to wear her oxygen more.  However, this week she reports she is doing much better. Reviewed COPD and how the pollen can cause breathing issues and advised to monitor pollen levels.  She reports her sugars are better around 118-120 in the AM.  She reports her MD office called to give her the contact information for the endocrinologist.  She states she will call.  Reviewed  and encouraged continued diabetes management.  She verbalized understanding.            Your next appointment is by telephone on 01/21/24 at 100 pm  Please call the care guide team at [phone] if you need to cancel or reschedule your appointment.   If you are experiencing a Mental Health or Behavioral Health Crisis or need someone to talk to, please call the Suicide and Crisis Lifeline: 988   The patient verbalized understanding of instructions, educational materials, and care plan provided today and  DECLINED offer to receive copy of patient instructions, educational materials, and care plan.   The patient has been provided with contact information for the care management team and has been advised to call with any health related questions or concerns.   Bary Leriche RN, MSN [facility], [facility] Health RN Care Manager Direct Dial: [phone]  Fax: [phone] Website: [email or web address]

## 2023-12-24 NOTE — Patient Outreach (Signed)
 Care Coordination   Follow Up Visit Note   12/24/2023 Name: [name] [name] MRN: [id number] DOB: 04-18-1951  [name] [name] is a 73 y.o. year old female who sees Allwardt, Crist Infante, PA-C for primary care. I spoke with  [name] [name] by phone today.  What matters to the patients health and wellness today?  Maintaining health    Goals Addressed             This Visit's Progress    Health Management- Diabetes and COPD       Care Coordination Interventions: Provided patient with basic written and verbal COPD education on self care/management/and exacerbation prevention Advised patient to track and manage COPD triggers Provided written and verbal instructions on pursed lip breathing and utilized returned demonstration as teach back Advised patient to self assesses COPD action plan zone and make appointment with provider if in the yellow zone for 48 hours without improvement Provided education to patient about basic DM disease process Reviewed medications with patient and discussed importance of medication adherence  Spoke with patient.  She reports she is doing better this week. She reports the end of last week she had some problems breathing where she needed to wear her oxygen more.  However, this week she reports she is doing much better. Reviewed COPD and how the pollen can cause breathing issues and advised to monitor pollen levels.  She reports her sugars are better around 118-120 in the AM.  She reports her MD office called to give her the contact information for the endocrinologist.  She states she will call.  Reviewed  and encouraged continued diabetes management.  She verbalized understanding.            SDOH assessments and interventions completed:  Yes  SDOH Interventions Today    Flowsheet Row Most Recent Value  SDOH Interventions   Food Insecurity Interventions Intervention Not Indicated  Housing Interventions Intervention Not Indicated  Transportation Interventions Intervention  Not Indicated  Utilities Interventions Intervention Not Indicated        Care Coordination Interventions:  Yes, provided   Follow up plan: Follow up call scheduled for May    Encounter Outcome:  Patient Visit Completed   Bary Leriche RN, MSN [facility], [facility] Health RN Care Manager Direct Dial: [phone]  Fax: [phone] Website: [email or web address]

## 2023-12-28 ENCOUNTER — Telehealth: Payer: Self-pay

## 2023-12-28 ENCOUNTER — Other Ambulatory Visit: Payer: Self-pay

## 2023-12-28 MED ORDER — FLUCONAZOLE 150 MG PO TABS: ORAL_TABLET | ORAL | 0 refills | Status: DC

## 2023-12-28 NOTE — Telephone Encounter (Signed)
 Copied from CRM [id number]. Topic: Clinical - Medication Question >> Dec 28, 2023 11:58 AM Arley Phenix D wrote: Reason for CRM: Patient is requesting medication for a yeast infection. Patient stated that she has an itchy feeling and needs something as soon as possible.  Please see pt call msg/request and advise on Rx for Diflucan

## 2023-12-28 NOTE — Telephone Encounter (Signed)
 Rx sent to pharmacy and called pt to advise instructions on taking. Pt advised having issues breathing with pollen and allergies. Having to stay on oxygen more than being off of it. Pt advised if not better after taking both doses to call and we will schedule a visit. Pt not scheduled for Endo visit as of yet and will be calling to get scheduled.

## 2024-01-15 ENCOUNTER — Other Ambulatory Visit: Payer: Self-pay | Admitting: Physician Assistant

## 2024-01-15 DIAGNOSIS — F419 Anxiety disorder, unspecified: Secondary | ICD-10-CM

## 2024-01-15 NOTE — Telephone Encounter (Unsigned)
 Copied from CRM [id number]. Topic: Clinical - Medication Refill >> Jan 15, 2024 12:27 PM Alyse July wrote: Most Recent Primary Care Visit:  Provider: Alda Amas  Department: [facility]  Visit Type: OFFICE VISIT  Date: 11/16/2023  Medication: ALPRAZolam  (XANAX ) 0.5 MG tablet  Has the patient contacted their pharmacy? Yes (Agent: If no, request that the patient contact the pharmacy for the refill. If patient does not wish to contact the pharmacy document the reason why and proceed with request.) (Agent: If yes, when and what did the pharmacy advise?)  Is this the correct pharmacy for this prescription? Yes If no, delete pharmacy and type the correct one.  This is the patient's preferred pharmacy:  [facility] #[id number] [place], [place] - [phone] AT [facility] OF [facility] & [address] [place] [address] Phone: [phone] Fax: [phone]   Has the prescription been filled recently? No  Is the patient out of the medication? No  Has the patient been seen for an appointment in the last year OR does the patient have an upcoming appointment? Yes   Can we respond through MyChart? No  Agent: Please be advised that Rx refills may take up to 3 business days. We ask that you follow-up with your pharmacy.

## 2024-01-15 NOTE — Telephone Encounter (Signed)
 Last OV: 11/16/23  Next OV: 02/12/24  Last Filled: 08/17/23  Quantity: 60 w/ 2 refills

## 2024-01-17 MED ORDER — ALPRAZOLAM 0.5 MG PO TABS: ORAL_TABLET | ORAL | 2 refills | Status: DC

## 2024-01-20 ENCOUNTER — Other Ambulatory Visit: Payer: Self-pay | Admitting: Physician Assistant

## 2024-01-21 ENCOUNTER — Other Ambulatory Visit: Payer: Self-pay

## 2024-01-21 NOTE — Patient Outreach (Signed)
 Complex Care Management   Visit Note  01/21/2024  Name:  [name] [name] MRN: [id number] DOB: 08-Jul-1951  Situation: Referral received for Complex Care Management related to COPD and Diabetes with Complications I obtained verbal consent from Patient.  Visit completed with Patient & RNCM  on the phone  Background:   Past Medical History:  Diagnosis Date   Bronchitis, asthmatic    Coronary artery disease    BMS to LAD 02/2008   Diabetes mellitus without complication (HCC)    Hyperlipidemia    Hypertensive heart disease 04/16/2016   Hypothyroidism    MI, old    Morbid obesity (HCC) 04/16/2016   S/P coronary artery stent placement 04/16/2016   BMS to LAD 2009   Tobacco abuse 04/16/2016    Assessment: Patient Reported Symptoms:  Cognitive Cognitive Status: Alert and oriented to person, place, and time, Insightful and able to interpret abstract concepts      Neurological Neurological Review of Symptoms: No symptoms reported    HEENT HEENT Symptoms Reported: No symptoms reported      Cardiovascular Cardiovascular Symptoms Reported: No symptoms reported Does patient have uncontrolled Hypertension?: No (controlled by meds, BPs run 110s-120s/70s) Cardiovascular Conditions: Hypertension, Coronary artery disease Cardiovascular Management Strategies: Medication therapy, Coping strategies, Diet modification, Adequate rest, Activity Cardiovascular Self-Management Outcome: 4 (good)  Respiratory Respiratory Symptoms Reported: No symptoms reported Additional Respiratory Details: Uses )2 at 2L as needed during exacerbations Respiratory Conditions: COPD, Asthma, Sleep disordered breathing Respiratory Self-Management Outcome: 4 (good) Respiratory Comment: Does not utilize CPAP, does sleep with 2 pillows routinely  Endocrine Patient reports the following symptoms related to hypoglycemia or hyperglycemia : No symptoms reported Is patient diabetic?: Yes Is patient checking blood sugars at home?:  Yes Endocrine Conditions: Diabetes, Vitamin D deficiency, Other Other Endocrine Conditions: osteopenia Endocrine Management Strategies: Adequate rest, Coping strategies, Medication therapy, Diet modification, Activity Endocrine Self-Management Outcome: 4 (good)  Gastrointestinal Gastrointestinal Symptoms Reported: No symptoms reported Additional Gastrointestinal Details: Hx of fatty liver (Hepatic steatosis)   Nutrition Risk Screen (CP): No indicators present  Genitourinary Genitourinary Symptoms Reported: No symptoms reported Genitourinary Conditions: Chronic kidney disease  Integumentary Integumentary Symptoms Reported: No symptoms reported    Musculoskeletal Musculoskelatal Symptoms Reviewed: Difficulty walking, Unsteady gait, Other Other Musculoskeletal Symptoms: utilizes a cane mainly for balance issues Additional Musculoskeletal Details: osteopenia - takes a calcium /D3 supplement daily Musculoskeletal Conditions: Back pain Falls in the past year?: No Number of falls in past year: 1 or less Was there an injury with Fall?: No Fall Risk Category Calculator: 0 Patient Fall Risk Level: Low Fall Risk    Psychosocial       Do you feel physically threatened by others?: No      12/24/2023    1:06 PM  Depression screen PHQ 2/9  Decreased Interest 0  Down, Depressed, Hopeless 1  PHQ - 2 Score 1    There were no vitals filed for this visit.  Medications Reviewed Today     Reviewed by Randye Buttner, RN (Registered Nurse) on 01/21/24 at 1427  Med List Status: <None>   Medication Order Taking? Sig Documenting Provider Last Dose Status Informant  Accu-Chek FastClix Lancets MISC [id number] Yes USE TO TEST UP TO FOUR TIMES DAILY AS DIRECTED Raymona Caldwell, MD Taking Active   ACCU-CHEK GUIDE test strip [id number] Yes USE AS DIRECTED Allwardt, Deleta Felix, PA-C Taking Active   albuterol  (ACCUNEB ) 1.25 MG/3ML nebulizer solution [id number] Yes TAKE 3 MLS BY NEBULIZATION THREE TIMES DAILY  AS NEEDED  FOR WHEEZING Allwardt, Alyssa M, PA-C Taking Active   albuterol  (VENTOLIN  HFA) 108 (90 Base) MCG/ACT inhaler [id number] Yes INHALE 2 PUFFS INTO THE LUNGS EVERY 6 HOURS AS NEEDED FOR WHEEZING OR SHORTNESS OF BREATH Allwardt, Alyssa M, PA-C Taking Active   ALPRAZolam  (XANAX ) 0.5 MG tablet [id number] Yes TAKE 1 TABLET(0.5 MG) BY MOUTH TWICE DAILY AS NEEDED FOR ANXIETY Allwardt, Alyssa M, PA-C Taking Active   aspirin  81 MG tablet [id number] Yes Take 81 mg by mouth daily. [provider] Taking Active Self  atorvastatin  (LIPITOR) 10 MG tablet [id number] Yes TAKE 1 TABLET(10 MG) BY MOUTH DAILY Maudine Sos, MD Taking Active   blood glucose meter kit and supplies KIT [id number]  Dispense based on patient and insurance preference. Use up to four times daily as directed. DX E11.8 Allwardt, Deleta Felix, PA-C  Active   Budeson-Glycopyrrol-Formoterol  (BREZTRI  AEROSPHERE) 160-9-4.8 MCG/ACT AERO [id number] Yes Inhale 2 puffs into the lungs 2 (two) times daily. Allwardt, Deleta Felix, PA-C Taking Active   Calcium  Carb-Cholecalciferol (CALCIUM  500 + D3 PO) [id number] Yes Take by mouth. [provider] Taking Active   fluconazole  (DIFLUCAN ) 150 MG tablet [id number] Yes Take 1 tab 72 hours after first if still symptomatic Allwardt, Alyssa M, PA-C Taking Active   furosemide  (LASIX ) 20 MG tablet [id number] Yes TAKE AS DIRECTED THREE TIMES WEEK Allwardt, Alyssa M, PA-C Taking Active   glipiZIDE  (GLUCOTROL  XL) 5 MG 24 hr tablet [id number]  Take 1 tablet (5 mg total) by mouth daily with breakfast. Allwardt, Deleta Felix, PA-C  Expired 12/20/23 2359            Med Note Jayne Mews, DIONNE J   Mon Nov 23, 2023  1:43 PM) Taking 2 tablets daily  INVOKANA  100 MG TABS tablet [id number] Yes Take 100 mg by mouth every morning. [provider] Taking Active            Med Note Alida Ion, TAMMY B   Thu Oct 15, 2023  7:18 AM) Approved for [employer] medication assistance program thru 09/21/2024  ipratropium (ATROVENT ) 0.06 %  nasal spray [id number] Yes USE 2 SPRAYS IN EACH NOSTRIL FOUR TIMES DAILY AS DIRECTED Allwardt, Alyssa M, PA-C Taking Active   ipratropium-albuterol  (DUONEB) 0.5-2.5 (3) MG/3ML SOLN [id number] Yes Take 3 mLs by nebulization every 6 (six) hours as needed. Allwardt, Deleta Felix, PA-C Taking Active   isosorbide  mononitrate (IMDUR ) 60 MG 24 hr tablet [id number] Yes TAKE 1 TABLET(60 MG) BY MOUTH DAILY Maudine Sos, MD Taking Active   levothyroxine  (SYNTHROID ) 125 MCG tablet [id number] Yes TAKE 1 TABLET(125 MCG) BY MOUTH DAILY Allwardt, Alyssa M, PA-C Taking Active   meloxicam  (MOBIC ) 7.5 MG tablet [id number] Yes Take 1 tablet (7.5 mg total) by mouth daily. Allwardt, Deleta Felix, PA-C Taking Active   Menthol, Topical Analgesic, (BIOFREEZE) 4 % GEL [id number] Yes Apply topically. [provider] Taking Active   Olopatadine  HCl 0.2 % SOLN [id number] Yes Apply 1 drop to eye 2 (two) times daily. Allwardt, Deleta Felix, PA-C Taking Active   pantoprazole  (PROTONIX ) 40 MG tablet [id number]  TAKE 1 TABLET(40 MG) BY MOUTH DAILY Allwardt, Alyssa M, PA-C  Active   predniSONE  (DELTASONE ) 20 MG tablet [id number] No Take 1 tablet (20 mg total) by mouth 2 (two) times daily with a meal.  Patient not taking: Reported on 01/21/2024   Alexander Iba, PA Not Taking Active   vitamin B-12 (CYANOCOBALAMIN ) 1000 MCG tablet [id number] Yes Take 1,000 mcg by mouth daily. [provider] Taking Active  Recommendation:   PCP Follow-up on 02/17/24 with Alyssa Allwardt,PA-C  Follow Up Plan:   Telephone follow up appointment date/time:  02/18/2024 @ 1pm  Bartholomew Light A. Saverio Curling RN, BA, [facility], CRRN [place]  [facility] Population Health RN Care Manager Direct Dial: [phone]  Fax: [phone]

## 2024-01-21 NOTE — Patient Instructions (Signed)
 Visit Information  Thank you for taking time to visit with me today. Please don't hesitate to contact me if I can be of assistance to you before our next scheduled telephone appointment.  Our next appointment is by telephone on Feb 18, 2024 at 1pm  Following is a copy of your care plan:   Goals Addressed             This Visit's Progress    VBCI RN Care Plan       Problems:  Chronic Disease Management support and education needs related to COPD and DMII  Goal: Over the next 3 months the Patient will continue to work with RN Care Manager and/or Social Worker to address care management and care coordination needs related to COPD and DMII as evidenced by adherence to care management team scheduled appointments      Interventions:   COPD Interventions: Advised patient to track and manage COPD triggers Provided instruction about proper use of medications used for management of COPD including inhalers Advised patient to self assesses COPD action plan zone and make appointment with provider if in the yellow zone for 48 hours without improvement Advised patient to engage in light exercise as tolerated 3-5 days a week to aid in the the management of COPD Provided education about and advised patient to utilize infection prevention strategies to reduce risk of respiratory infection Discussed the importance of adequate rest and management of fatigue with COPD Screening for signs and symptoms of depression related to chronic disease state  Assessed social determinant of health barriers   Diabetes Interventions: Assessed patient's understanding of A1c goal: <7% Provided education to patient about basic DM disease process Reviewed medications with patient and discussed importance of medication adherence Counseled on importance of regular laboratory monitoring as prescribed Discussed plans with patient for ongoing care management follow up and provided patient with direct contact information  for care management team Advised patient, providing education and rationale, to check cbg at least twice daily and record, calling PCP for findings outside established parameters Review of patient status, including review of consultants reports, relevant laboratory and other test results, and medications completed Lab Results  Component Value Date   HGBA1C 8.1 (A) 09/21/2023    Patient Self-Care Activities:  Attend all scheduled provider appointments Attend church or other social activities Call pharmacy for medication refills 3-7 days in advance of running out of medications Call provider office for new concerns or questions  Perform all self care activities independently  Take medications as prescribed   Work with the pharmacist to address medication management needs and will continue to work with the clinical team to address health care and disease management related needs schedule appointment with eye doctor check feet daily for cuts, sores or redness take the blood sugar log to all doctor visits drink 6 to 8 glasses of water each day eat fish at least once per week fill half of plate with vegetables identify and avoid work-related triggers identify and remove indoor air pollutants limit outdoor activity during cold weather listen for public air quality announcements every day begin a symptom diary develop a rescue plan eliminate symptom triggers at home follow rescue plan if symptoms flare-up use an extra pillow to sleep use devices that will help like a cane, sock-puller or reacher  Plan:  The patient has been provided with contact information for the care management team and has been advised to call with any health related questions or concerns.  Patient verbalizes understanding of instructions and care plan provided today and agrees to view in MyChart. Active MyChart status and patient understanding of how to access instructions and care plan via  MyChart confirmed with patient.     The patient has been provided with contact information for the care management team and has been advised to call with any health related questions or concerns.   Please call the care guide team at [phone] if you need to cancel or reschedule your appointment.   Please call 1-800-273-TALK (toll free, 24 hour hotline) if you are experiencing a Mental Health or Behavioral Health Crisis or need someone to talk to.  Adiba Fargnoli A. Saverio Curling RN, BA, [facility], CRRN [place]  [facility] Population Health RN Care Manager Direct Dial: [phone]  Fax: [phone]

## 2024-01-22 ENCOUNTER — Telehealth: Payer: Self-pay | Admitting: *Deleted

## 2024-01-22 MED ORDER — GLIPIZIDE ER 10 MG PO TB24: 10.0000 mg | ORAL_TABLET | Freq: Every day | ORAL | 1 refills | Status: DC

## 2024-01-22 NOTE — Telephone Encounter (Signed)
 Spoke to pt told her will send new Rx for Glipizide  XL 10 mg to the pharmacy. Pt verbalized understanding. Rx sent.

## 2024-01-22 NOTE — Addendum Note (Signed)
 Addended by: Winona Haw on: 01/22/2024 11:03 AM   Modules accepted: Orders

## 2024-01-22 NOTE — Telephone Encounter (Signed)
 Alyssa, please clarify Rx Glipizide  dose and how often?

## 2024-01-22 NOTE — Telephone Encounter (Signed)
 Copied from CRM [id number]. Topic: Clinical - Prescription Issue >> Jan 22, 2024  9:34 AM [name] [name] wrote: Reason for CRM: Pt states that she take 2 per day and needs a new prescription stating that she is taking 2 5MG  per day or needs a new RX with 10MG  once per day.   This is for glipiZIDE  (GLUCOTROL  XL) 5 MG 24 hr tablet. And the pharmacy states they will not refill until June 17th.   [facility] #[id number] [place], [place] - [phone] AT [facility] OF [facility] & [address] [place] [address] Phone: [phone] Fax: [phone] Hours: Not open 24 hours

## 2024-02-11 ENCOUNTER — Other Ambulatory Visit (INDEPENDENT_AMBULATORY_CARE_PROVIDER_SITE_OTHER)

## 2024-02-11 DIAGNOSIS — E039 Hypothyroidism, unspecified: Secondary | ICD-10-CM | POA: Diagnosis not present

## 2024-02-11 LAB — TSH: TSH: 2.03 u[IU]/mL (ref 0.35–5.50)

## 2024-02-12 ENCOUNTER — Ambulatory Visit: Payer: Self-pay | Admitting: Physician Assistant

## 2024-02-12 ENCOUNTER — Other Ambulatory Visit: Payer: Medicare HMO

## 2024-02-12 ENCOUNTER — Other Ambulatory Visit: Payer: Self-pay

## 2024-02-12 MED ORDER — LEVOTHYROXINE SODIUM 125 MCG PO TABS: 125.0000 ug | ORAL_TABLET | Freq: Every day | ORAL | 0 refills | Status: DC

## 2024-02-15 ENCOUNTER — Ambulatory Visit: Payer: Medicare HMO

## 2024-02-17 ENCOUNTER — Ambulatory Visit: Payer: Medicare HMO | Admitting: Physician Assistant

## 2024-02-17 VITALS — BP 110/70 | HR 76 | Temp 97.9°F | Ht 65.0 in | Wt 296.6 lb

## 2024-02-17 DIAGNOSIS — E1165 Type 2 diabetes mellitus with hyperglycemia: Secondary | ICD-10-CM

## 2024-02-17 DIAGNOSIS — R0902 Hypoxemia: Secondary | ICD-10-CM

## 2024-02-17 DIAGNOSIS — J449 Chronic obstructive pulmonary disease, unspecified: Secondary | ICD-10-CM

## 2024-02-17 DIAGNOSIS — J069 Acute upper respiratory infection, unspecified: Secondary | ICD-10-CM | POA: Diagnosis not present

## 2024-02-17 DIAGNOSIS — Z7984 Long term (current) use of oral hypoglycemic drugs: Secondary | ICD-10-CM | POA: Diagnosis not present

## 2024-02-17 LAB — POCT GLYCOSYLATED HEMOGLOBIN (HGB A1C): Hemoglobin A1C: 8.1 % — AB (ref 4.0–5.6)

## 2024-02-17 MED ORDER — AZITHROMYCIN 250 MG PO TABS: ORAL_TABLET | ORAL | 0 refills | Status: DC

## 2024-02-17 MED ORDER — PULSE OXIMETER FOR FINGER MISC
0 refills | Status: AC
Start: 2024-02-17 — End: ?

## 2024-02-17 NOTE — Progress Notes (Signed)
 Patient ID: [name] [name], female    DOB: July 13, 1951, 73 y.o.   MRN: [id number]   Assessment & Plan:  Type 2 diabetes mellitus with hyperglycemia, without long-term current use of insulin  (HCC) -     POCT glycosylated hemoglobin (Hb A1C)  COPD GOLD 0/ prob AB still smoking  -     Pulse Oximeter For Finger; Use as needed to check pulse oximetry.  Dispense: 1 each; Refill: 0  Hypoxia -     Pulse Oximeter For Finger; Use as needed to check pulse oximetry.  Dispense: 1 each; Refill: 0  Acute URI  Other orders -     Azithromycin ; Take two tablets on day one, followed by one tablet daily for the next four days.  Dispense: 6 tablet; Refill: 0     Assessment & Plan Acute Upper Respiratory Infection Symptoms include wheezing, coughing, sneezing, headache, and ear pain since Thursday. Her COPD may be exacerbated by the infection. She used her nebulizer at 4 AM due to symptoms. - Prescribe Z-Pak (azithromycin ) for infection. - Continue use of Breztri  inhaler and nebulizer.  Chronic Obstructive Pulmonary Disease (COPD) COPD with chronic hypoxia. Currently using Breztri  inhaler and nebulizer at home. Oxygen  saturation is 93% on room air. Experiencing an acute upper respiratory infection which could exacerbate COPD symptoms. She has access to home oxygen  and uses it as needed. - Continue Breztri  inhaler and nebulizer as needed. - Ensure availability of home oxygen . - Order pulse oximeter for home use.  Chronic Hypoxia Chronic hypoxia secondary to COPD. Oxygen  saturation is 93% on room air currently. She does not have a home pulse oximeter, which is necessary for monitoring her condition. - Order pulse oximeter for home use.  Type 2 Diabetes Mellitus with Hyperglycemia A1c remains elevated at 8.1. Stress and dietary habits, including increased snacking, are contributing factors. Current medications include Invokana  100 mg and glipizide  10 mg. Previous trial of Trulicity  resulted in severe  constipation. Awaiting endocrinology consultation for further management. She acknowledges increased stress due to personal circumstances, impacting diabetes control. - Encourage reduction in snacking and increase in physical activity. - Continue Invokana  100 mg and glipizide  10 mg. - Follow up with endocrinology for further management.  Lab Results  Component Value Date   HGBA1C 8.1 (A) 02/17/2024   HGBA1C 8.1 (A) 09/21/2023   HGBA1C 7.9 (A) 05/29/2023         Return in about 3 months (around 05/19/2024) for recheck/follow-up.    Subjective:    Chief Complaint  Patient presents with   Diabetes    Pt in today for diabetes follow up; states she thinks she has and URI; wheezing; used oxygen  at home yesterday 2.5L;     Diabetes   Discussed the use of AI scribe software for clinical note transcription with the patient, who gave verbal consent to proceed.  History of Present Illness [name] [name] is a 73 year old female with COPD who presents with an upper respiratory infection.  She has been experiencing symptoms of an upper respiratory infection, including wheezing, coughing, sneezing, and a headache. She has been using her nebulizer since 4 AM due to coughing and wheezing. She is currently using Breztri  as her inhaler and has access to oxygen  at home, which she used a couple of times yesterday.  She has a history of type 2 diabetes with an A1c of 8.1, which has been stable over the past four months but increased from 7.9 previously. She notes increased  snacking, particularly on chips, due to stress from her husband's health issues. Her current diabetes medications include Invokana  100 mg and glipizide  10 mg. She also takes calcium  and vitamin D3 supplements.  Her husband has dementia, which has been exacerbated by stress and illness. He had a significant seizure three years ago, which affected his memory. She reports that his condition worsens when he is sick but improves when he  recovers.  She mentions a stressful family situation involving her daughter's boyfriend's son, who had a recent incident involving intoxication and vandalism. This has added to her stress levels, impacting her diabetes management.     Past Medical History:  Diagnosis Date   Bronchitis, asthmatic    Coronary artery disease    BMS to LAD 02/2008   Diabetes mellitus without complication (HCC)    Hyperlipidemia    Hypertensive heart disease 04/16/2016   Hypothyroidism    MI, old    Morbid obesity (HCC) 04/16/2016   S/P coronary artery stent placement 04/16/2016   BMS to LAD 2009   Tobacco abuse 04/16/2016    Past Surgical History:  Procedure Laterality Date   ABDOMINAL HYSTERECTOMY     35; very heavy bleeding   ANKLE SURGERY Right    CHOLECYSTECTOMY     CORONARY ANGIOPLASTY  02/2008   bare metal stent to LAD    CORONARY STENT PLACEMENT     KNEE ARTHROSCOPY     OOPHORECTOMY Left    age 21   ROTATOR CUFF REPAIR Bilateral    THORACIC OUTLET SURGERY     WRIST SURGERY      Family History  Problem Relation Age of Onset   Cancer Mother    Diabetes Son    Hypertension Son     Social History   Tobacco Use   Smoking status: Every Day    Current packs/day: 0.25    Average packs/day: 0.3 packs/day for 31.0 years (7.8 ttl pk-yrs)    Types: Cigarettes   Smokeless tobacco: Never   Tobacco comments:    1 PPD  Vaping Use   Vaping status: Never Used  Substance Use Topics   Alcohol use: No   Drug use: No     Allergies  Allergen Reactions   Clindamycin/Lincomycin Other (See Comments)    Head ache    Codeine  Hives   Cortisone    Dilaudid [Hydromorphone Hcl] Nausea And Vomiting   Iodides    Reglan [Metoclopramide] Other (See Comments)    Other reaction(s): Dystonia Hallucinations/Violent   Tramadol    Trulicity  [Dulaglutide ] Other (See Comments)    Severe constipation   Tape Rash    Blisters     Review of Systems NEGATIVE UNLESS OTHERWISE INDICATED IN HPI       Objective:     BP 110/70 (BP Location: Right Arm, Patient Position: Sitting, Cuff Size: Normal)   Pulse 76   Temp 97.9 F (36.6 C) (Temporal)   Ht 5\' 5"  (1.651 m)   Wt 296 lb 9.6 oz (134.5 kg)   LMP  (LMP Unknown)   SpO2 93%   BMI 49.36 kg/m   Wt Readings from Last 3 Encounters:  02/17/24 296 lb 9.6 oz (134.5 kg)  11/16/23 293 lb 12.8 oz (133.3 kg)  11/10/23 287 lb (130.2 kg)    BP Readings from Last 3 Encounters:  02/17/24 110/70  11/16/23 111/73  11/10/23 115/78     Physical Exam Vitals and nursing note reviewed.  Constitutional:      Appearance: Normal  appearance. She is obese.  Eyes:     Extraocular Movements: Extraocular movements intact.     Conjunctiva/sclera: Conjunctivae normal.     Pupils: Pupils are equal, round, and reactive to light.  Cardiovascular:     Rate and Rhythm: Normal rate and regular rhythm.     Pulses: Normal pulses.     Heart sounds: No murmur heard. Pulmonary:     Breath sounds: No decreased air movement. Wheezing (throughout) present. No decreased breath sounds.  Musculoskeletal:     Right shoulder: No tenderness, bony tenderness or crepitus. Normal range of motion. Normal strength.  Skin:    Comments: Bluish-colored nose  Neurological:     General: No focal deficit present.     Mental Status: She is alert and oriented to person, place, and time.  Psychiatric:        Mood and Affect: Mood normal.             Brolin Dambrosia M Janell Keeling, PA-C

## 2024-02-18 ENCOUNTER — Other Ambulatory Visit: Payer: Self-pay

## 2024-02-18 NOTE — Patient Instructions (Signed)
 Visit Information  Thank you for taking time to visit with me today. Please don't hesitate to contact me if I can be of assistance to you before our next scheduled telephone appointment.  Our next appointment is by telephone on 6/26 at 1:45pm  Following is a copy of your care plan:   Goals Addressed             This Visit's Progress    VBCI RN Care Plan       Problems:  Chronic Disease Management support and education needs related to COPD and DMII  Goal: Over the next 3 months the Patient will continue to work with RN Care Manager and/or Social Worker to address care management and care coordination needs related to COPD and DMII as evidenced by adherence to care management team scheduled appointments      Interventions:   COPD Interventions: Advised patient to track and manage COPD triggers Provided instruction about proper use of medications used for management of COPD including inhalers Advised patient to self assesses COPD action plan zone and make appointment with provider if in the yellow zone for 48 hours without improvement Advised patient to engage in [name] exercise as tolerated 3-5 days a week to aid in the the management of COPD Provided education about and advised patient to utilize infection prevention strategies to reduce risk of respiratory infection Discussed the importance of adequate rest and management of fatigue with COPD Screening for signs and symptoms of depression related to chronic disease state  Assessed social determinant of health barriers   Diabetes Interventions: Assessed patient's understanding of A1c goal: <7% Provided education to patient about basic DM disease process Reviewed medications with patient and discussed importance of medication adherence Counseled on importance of regular laboratory monitoring as prescribed Discussed plans with patient for ongoing care management follow up and provided patient with direct contact information for  care management team Advised patient, providing education and rationale, to check cbg at least twice daily and record, calling PCP for findings outside established parameters Review of patient status, including review of consultants reports, relevant laboratory and other test results, and medications completed Lab Results  Component Value Date   HGBA1C 8.1 (A) 09/21/2023  02/17/2024 A1C = 8.1 (no change from 09/21/23.) PCP referred patient to Endocrinologist, Dr. Jorge Newcomer for specialist consultation re patient's DM2 management. Pt has appt for /02/2024 @ 1pm.  Patient Self-Care Activities:  Attend all scheduled provider appointments Attend church or other social activities Call pharmacy for medication refills 3-7 days in advance of running out of medications Call provider office for new concerns or questions  Perform all self care activities independently  Take medications as prescribed   Work with the pharmacist to address medication management needs and will continue to work with the clinical team to address health care and disease management related needs schedule appointment with eye doctor check feet daily for cuts, sores or redness take the blood sugar log to all doctor visits drink 6 to 8 glasses of water each day eat fish at least once per week fill half of plate with vegetables identify and avoid work-related triggers identify and remove indoor air pollutants limit outdoor activity during cold weather listen for public air quality announcements every day begin a symptom diary develop a rescue plan eliminate symptom triggers at home follow rescue plan if symptoms flare-up use an extra pillow to sleep use devices that will help like a cane, sock-puller or reacher  Plan:  The patient  has been provided with contact information for the care management team and has been advised to call with any health related questions or concerns. Next appointment with RNCM [name] [name] is 6/26 @  145pm             Patient verbalizes understanding of instructions and care plan provided today and agrees to view in MyChart. Active MyChart status and patient understanding of how to access instructions and care plan via MyChart confirmed with patient.     The patient has been provided with contact information for the care management team and has been advised to call with any health related questions or concerns.   Please call the care guide team at [phone] if you need to cancel or reschedule your appointment.   Please call 1-800-273-TALK (toll free, 24 hour hotline) if you are experiencing a Mental Health or Behavioral Health Crisis or need someone to talk to.  Maite Burlison A. Saverio Curling RN, BA, [facility], CRRN [place]  [facility] Population Health RN Care Manager Direct Dial: [phone]  Fax: [phone]

## 2024-02-18 NOTE — Patient Outreach (Signed)
 Complex Care Management   Visit Note  02/18/2024  Name:  [name] [name] MRN: [id number] DOB: [birth year]  Situation: Referral received for Complex Care Management related to COPD and Diabetes with Complications I obtained verbal consent from Patient.  Visit completed with Patient & RNCM  on the phone  Background:   Past Medical History:  Diagnosis Date   Bronchitis, asthmatic    Coronary artery disease    BMS to LAD 02/2008   Diabetes mellitus without complication (HCC)    Hyperlipidemia    Hypertensive heart disease 04/16/2016   Hypothyroidism    MI, old    Morbid obesity (HCC) 04/16/2016   S/P coronary artery stent placement 04/16/2016   BMS to LAD 2009   Tobacco abuse 04/16/2016    Assessment: Patient Reported Symptoms:  Cognitive Cognitive Status: Alert and oriented to person, place, and time, Insightful and able to interpret abstract concepts   Health Maintenance Behaviors: Annual physical exam Health Facilitated by: Healthy diet  Neurological Neurological Review of Symptoms: No symptoms reported    HEENT HEENT Symptoms Reported: No symptoms reported      Cardiovascular Cardiovascular Symptoms Reported: No symptoms reported Does patient have uncontrolled Hypertension?: No Cardiovascular Conditions: Hypertension, Coronary artery disease Cardiovascular Management Strategies: Medication therapy, Coping strategies, Diet modification, Adequate rest, Activity Cardiovascular Self-Management Outcome: 4 (good)  Respiratory Respiratory Symptoms Reported: No symptoms reported Additional Respiratory Details: utilizes O2 at 2L as needed during exacerbations Respiratory Conditions: COPD, Asthma, Sleep disordered breathing Respiratory Self-Management Outcome: 4 (good) Respiratory Comment: refuses to use CPAP, prefers to sleep with 2 pillows instead  Endocrine Patient reports the following symptoms related to hypoglycemia or hyperglycemia : No symptoms reported Is patient diabetic?:  Yes Is patient checking blood sugars at home?: Yes Endocrine Conditions: Diabetes, Vitamin D deficiency Other Endocrine Conditions: osteopenia Endocrine Management Strategies: Adequate rest, Coping strategies, Medication therapy, Diet modification, Activity, Weight management Endocrine Self-Management Outcome: 3 (uncertain) Endocrine Comment: 02/17/24 A1c = 8.1, goal is <7, patient's A1c unchaged from 6 months prior  Gastrointestinal Gastrointestinal Symptoms Reported: No symptoms reported Additional Gastrointestinal Details: suffers from Hepatic steatosis (fatty liver)   Nutrition Risk Screen (CP): No indicators present  Genitourinary Genitourinary Symptoms Reported: No symptoms reported Genitourinary Conditions: Chronic kidney disease  Integumentary Integumentary Symptoms Reported: No symptoms reported    Musculoskeletal Musculoskelatal Symptoms Reviewed: Difficulty walking, Weakness, Unsteady gait Other Musculoskeletal Symptoms: uses a cane for balance issues Additional Musculoskeletal Details: osteopenia - takes calcium /D3 supplement daily Musculoskeletal Conditions: Back pain, Other Other Musculoskeletal Conditions: osteopenia Musculoskeletal Management Strategies: Coping strategies, Medication therapy, Routine screening, Diet modification, Adequate rest      Psychosocial Psychosocial Symptoms Reported: No symptoms reported     Quality of Family Relationships: supportive, involved, helpful Do you feel physically threatened by others?: No      02/17/2024   12:14 PM  Depression screen PHQ 2/9  Decreased Interest 0  Down, Depressed, Hopeless 2  PHQ - 2 Score 2  Altered sleeping 0  Tired, decreased energy 1  Change in appetite 1  Feeling bad or failure about yourself  1  Trouble concentrating 0  Moving slowly or fidgety/restless 0  Suicidal thoughts 0  PHQ-9 Score 5  Difficult doing work/chores Somewhat difficult    There were no vitals filed for this  visit.  Medications Reviewed Today     Reviewed by Randye Buttner, RN (Registered Nurse) on 02/18/24 at 1323  Med List Status: <None>   Medication Order Taking? Sig Documenting Provider Last  Dose Status Informant  Accu-Chek FastClix Lancets MISC [id number] Yes USE TO TEST UP TO FOUR TIMES DAILY AS DIRECTED Raymona Caldwell, MD Taking Active   ACCU-CHEK GUIDE test strip [id number] Yes USE AS DIRECTED Allwardt, Deleta Felix, PA-C Taking Active   albuterol  (ACCUNEB ) 1.25 MG/3ML nebulizer solution [id number] Yes TAKE 3 MLS BY NEBULIZATION THREE TIMES DAILY AS NEEDED FOR WHEEZING Allwardt, Alyssa M, PA-C Taking Active   albuterol  (VENTOLIN  HFA) 108 (90 Base) MCG/ACT inhaler [id number] Yes INHALE 2 PUFFS INTO THE LUNGS EVERY 6 HOURS AS NEEDED FOR WHEEZING OR SHORTNESS OF BREATH Allwardt, Alyssa M, PA-C Taking Active   ALPRAZolam  (XANAX ) 0.5 MG tablet [id number] Yes TAKE 1 TABLET(0.5 MG) BY MOUTH TWICE DAILY AS NEEDED FOR ANXIETY Allwardt, Alyssa M, PA-C Taking Active   aspirin  81 MG tablet [id number] Yes Take 81 mg by mouth daily. [provider] Taking Active Self  atorvastatin  (LIPITOR) 10 MG tablet [id number] Yes TAKE 1 TABLET(10 MG) BY MOUTH DAILY Maudine Sos, MD Taking Active   azithromycin  (ZITHROMAX  Z-PAK) 250 MG tablet [id number] Yes Take two tablets on day one, followed by one tablet daily for the next four days. Allwardt, Deleta Felix, PA-C Taking Active   blood glucose meter kit and supplies KIT [id number] Yes Dispense based on patient and insurance preference. Use up to four times daily as directed. DX E11.8 Allwardt, Deleta Felix, PA-C Taking Active   Budeson-Glycopyrrol-Formoterol  (BREZTRI  AEROSPHERE) 160-9-4.8 MCG/ACT AERO [id number] Yes Inhale 2 puffs into the lungs 2 (two) times daily. Allwardt, Deleta Felix, PA-C Taking Active   Calcium  Carb-Cholecalciferol (CALCIUM  500 + D3 PO) [id number] Yes Take by mouth. [provider] Taking Active   fluconazole  (DIFLUCAN ) 150 MG tablet [id number]  Yes Take 1 tab 72 hours after first if still symptomatic Allwardt, Alyssa M, PA-C Taking Active   furosemide  (LASIX ) 20 MG tablet [id number] Yes TAKE AS DIRECTED THREE TIMES WEEK Allwardt, Alyssa M, PA-C Taking Active   glipiZIDE  (GLUCOTROL  XL) 10 MG 24 hr tablet [id number] Yes Take 1 tablet (10 mg total) by mouth daily with breakfast. Allwardt, Deleta Felix, PA-C Taking Active   INVOKANA  100 MG TABS tablet [id number] Yes Take 100 mg by mouth every morning. [provider] Taking Active            Med Note Alida Ion, TAMMY B   Thu Oct 15, 2023  7:18 AM) Approved for [employer] medication assistance program thru 09/21/2024  ipratropium (ATROVENT ) 0.06 % nasal spray [id number] Yes USE 2 SPRAYS IN EACH NOSTRIL FOUR TIMES DAILY AS DIRECTED Allwardt, Alyssa M, PA-C Taking Active   ipratropium-albuterol  (DUONEB) 0.5-2.5 (3) MG/3ML SOLN [id number] Yes Take 3 mLs by nebulization every 6 (six) hours as needed. Allwardt, Deleta Felix, PA-C Taking Active   isosorbide  mononitrate (IMDUR ) 60 MG 24 hr tablet [id number] Yes TAKE 1 TABLET(60 MG) BY MOUTH DAILY Maudine Sos, MD Taking Active   levothyroxine  (SYNTHROID ) 125 MCG tablet [id number] Yes Take 1 tablet (125 mcg total) by mouth daily before breakfast. Allwardt, Deleta Felix, PA-C Taking Active   meloxicam  (MOBIC ) 7.5 MG tablet [id number] Yes Take 1 tablet (7.5 mg total) by mouth daily. Allwardt, Deleta Felix, PA-C Taking Active   Menthol, Topical Analgesic, (BIOFREEZE) 4 % GEL [id number] Yes Apply topically. [provider] Taking Active   Misc. Devices (PULSE OXIMETER FOR FINGER) MISC [id number] Yes Use as needed to check pulse oximetry. Allwardt, Deleta Felix, PA-C Taking Active   Olopatadine  HCl 0.2 % SOLN [id number] Yes Apply 1 drop to eye 2 (two)  times daily. Allwardt, Deleta Felix, PA-C Taking Active   pantoprazole  (PROTONIX ) 40 MG tablet [id number] Yes TAKE 1 TABLET(40 MG) BY MOUTH DAILY Allwardt, Alyssa M, PA-C Taking Active   vitamin B-12 (CYANOCOBALAMIN ) 1000 MCG  tablet [id number] Yes Take 1,000 mcg by mouth daily. [provider] Taking Active             Recommendation:   PCP Follow-up  Follow Up Plan:   Telephone follow up appointment date/time:  June 26 @ 1:45pm   Toini Failla A. Saverio Curling RN, BA, [facility], CRRN [place]  [facility] [place] Population Health RN Care Manager Direct Dial: [phone]  Fax: [phone]

## 2024-02-22 ENCOUNTER — Ambulatory Visit (INDEPENDENT_AMBULATORY_CARE_PROVIDER_SITE_OTHER)

## 2024-02-22 VITALS — BP 110/70 | Ht 65.0 in | Wt 297.0 lb

## 2024-02-22 DIAGNOSIS — Z0000 Encounter for general adult medical examination without abnormal findings: Secondary | ICD-10-CM

## 2024-02-22 NOTE — Patient Instructions (Signed)
 [name] [name] , Thank you for taking time out of your busy schedule to complete your Annual Wellness Visit with me. I enjoyed our conversation and look forward to speaking with you again next year. I, as well as your care team,  appreciate your ongoing commitment to your health goals. Please review the following plan we discussed and let me know if I can assist you in the future. Your Game plan/ To Do List    Referrals:None  Follow up Visits: Next Medicare AWV with our clinical staff: 02/22/2025   Have you seen your provider in the last 6 months (3 months if uncontrolled diabetes)? No Next Office Visit with your provider: 05/24/2024  Clinician Recommendations:  Aim for 30 minutes of exercise or brisk walking, 6-8 glasses of water, and 5 servings of fruits and vegetables each day.       This is a list of the screening recommended for you and due dates:  Health Maintenance  Topic Date Due   Mammogram  04/18/2024*   Zoster (Shingles) Vaccine (1 of 2) 05/19/2024*   Eye exam for diabetics  06/08/2024*   Flu Shot  04/22/2024   Hemoglobin A1C  05/19/2024   DTaP/Tdap/Td vaccine (2 - Td or Tdap) 06/22/2024   Yearly kidney function blood test for diabetes  09/20/2024   Yearly kidney health urinalysis for diabetes  09/20/2024   DEXA scan (bone density measurement)  01/19/2025   Complete foot exam   02/16/2025   Medicare Annual Wellness Visit  02/21/2025   Colon Cancer Screening  11/07/2026   Pneumonia Vaccine  Completed   Hepatitis C Screening  Completed   HPV Vaccine  Aged Out   Meningitis B Vaccine  Aged Out   COVID-19 Vaccine  Discontinued  *Topic was postponed. The date shown is not the original due date.    Advanced directives: (Declined) Advance directive discussed with you today. Even though you declined this today, please call our office should you change your mind, and we can give you the proper paperwork for you to fill out. Advance Care Planning is important because it:  [x]  Makes  sure you receive the medical care that is consistent with your values, goals, and preferences  [x]  It provides guidance to your family and loved ones and reduces their decisional burden about whether or not they are making the right decisions based on your wishes.  Follow the link provided in your after visit summary or read over the paperwork we have mailed to you to help you started getting your Advance Directives in place. If you need assistance in completing these, please reach out to us  so that we can help you!  See attachments for Preventive Care and Fall Prevention Tips.

## 2024-02-22 NOTE — Progress Notes (Signed)
 Because this visit was a virtual/telehealth visit,  certain criteria was not obtained, such a blood pressure, CBG if applicable, and timed get up and go. Any medications not marked as "taking" were not mentioned during the medication reconciliation part of the visit. Any vitals not documented were not able to be obtained due to this being a telehealth visit or patient was unable to self-report a recent blood pressure reading due to a lack of equipment at home via telehealth. Vitals that have been documented are verbally provided by the patient.  This visit was performed by a medical professional under my direct supervision. I was immediately available for consultation/collaboration. I have reviewed and agree with the Annual Wellness Visit documentation.  Subjective:   [name] [name] is a 73 y.o. who presents for a Medicare Wellness preventive visit.  As a reminder, Annual Wellness Visits don't include a physical exam, and some assessments may be limited, especially if this visit is performed virtually. We may recommend an in-person follow-up visit with your provider if needed.  Visit Complete: Virtual I connected with  [name] [name] on 02/22/24 by a audio enabled telemedicine application and verified that I am speaking with the correct person using two identifiers.  Patient Location: Home  Provider Location: Home Office  I discussed the limitations of evaluation and management by telemedicine. The patient expressed understanding and agreed to proceed.  Vital Signs: Because this visit was a virtual/telehealth visit, some criteria may be missing or patient reported. Any vitals not documented were not able to be obtained and vitals that have been documented are patient reported.  VideoDeclined- This patient declined Librarian, academic. Therefore the visit was completed with audio only.  Persons Participating in Visit: Patient.  AWV Questionnaire: No: Patient Medicare AWV  questionnaire was not completed prior to this visit.  Cardiac Risk Factors include: advanced age (>10men, >21 women);obesity (BMI >30kg/m2);diabetes mellitus;dyslipidemia;hypertension     Objective:     Today's Vitals   02/22/24 1435 02/22/24 1437  BP: 110/70   Weight: 297 lb (134.7 kg)   Height: 5\' 5"  (1.651 m)   PainSc:  0-No pain   Body mass index is 49.42 kg/m.     02/22/2024    2:35 PM 02/11/2023    1:01 PM 02/09/2023   10:26 AM 01/02/2023    3:18 PM 01/27/2022   10:28 AM 01/21/2021   10:41 AM 11/21/2019    3:56 PM  Advanced Directives  Does Patient Have a Medical Advance Directive? No No No No No No No  Does patient want to make changes to medical advance directive?      No - Patient declined   Would patient like information on creating a medical advance directive? No - Patient declined No - Patient declined No - Patient declined Yes (ED - Information included in AVS) No - Patient declined  Yes (MAU/Ambulatory/Procedural Areas - Information given)    Current Medications (verified) Outpatient Encounter Medications as of 02/22/2024  Medication Sig   Accu-Chek FastClix Lancets MISC USE TO TEST UP TO FOUR TIMES DAILY AS DIRECTED   ACCU-CHEK GUIDE test strip USE AS DIRECTED   albuterol  (ACCUNEB ) 1.25 MG/3ML nebulizer solution TAKE 3 MLS BY NEBULIZATION THREE TIMES DAILY AS NEEDED FOR WHEEZING   albuterol  (VENTOLIN  HFA) 108 (90 Base) MCG/ACT inhaler INHALE 2 PUFFS INTO THE LUNGS EVERY 6 HOURS AS NEEDED FOR WHEEZING OR SHORTNESS OF BREATH   ALPRAZolam  (XANAX ) 0.5 MG tablet TAKE 1 TABLET(0.5 MG) BY MOUTH TWICE  DAILY AS NEEDED FOR ANXIETY   aspirin  81 MG tablet Take 81 mg by mouth daily.   atorvastatin  (LIPITOR) 10 MG tablet TAKE 1 TABLET(10 MG) BY MOUTH DAILY   azithromycin  (ZITHROMAX  Z-PAK) 250 MG tablet Take two tablets on day one, followed by one tablet daily for the next four days.   blood glucose meter kit and supplies KIT Dispense based on patient and insurance preference. Use up  to four times daily as directed. DX E11.8   Budeson-Glycopyrrol-Formoterol  (BREZTRI  AEROSPHERE) 160-9-4.8 MCG/ACT AERO Inhale 2 puffs into the lungs 2 (two) times daily.   Calcium  Carb-Cholecalciferol (CALCIUM  500 + D3 PO) Take by mouth.   fluconazole  (DIFLUCAN ) 150 MG tablet Take 1 tab 72 hours after first if still symptomatic   furosemide  (LASIX ) 20 MG tablet TAKE AS DIRECTED THREE TIMES WEEK   glipiZIDE  (GLUCOTROL  XL) 10 MG 24 hr tablet Take 1 tablet (10 mg total) by mouth daily with breakfast.   INVOKANA  100 MG TABS tablet Take 100 mg by mouth every morning.   ipratropium (ATROVENT ) 0.06 % nasal spray USE 2 SPRAYS IN EACH NOSTRIL FOUR TIMES DAILY AS DIRECTED   ipratropium-albuterol  (DUONEB) 0.5-2.5 (3) MG/3ML SOLN Take 3 mLs by nebulization every 6 (six) hours as needed.   isosorbide  mononitrate (IMDUR ) 60 MG 24 hr tablet TAKE 1 TABLET(60 MG) BY MOUTH DAILY   levothyroxine  (SYNTHROID ) 125 MCG tablet Take 1 tablet (125 mcg total) by mouth daily before breakfast.   meloxicam  (MOBIC ) 7.5 MG tablet Take 1 tablet (7.5 mg total) by mouth daily.   Menthol, Topical Analgesic, (BIOFREEZE) 4 % GEL Apply topically.   Misc. Devices (PULSE OXIMETER FOR FINGER) MISC Use as needed to check pulse oximetry.   Olopatadine  HCl 0.2 % SOLN Apply 1 drop to eye 2 (two) times daily.   pantoprazole  (PROTONIX ) 40 MG tablet TAKE 1 TABLET(40 MG) BY MOUTH DAILY   vitamin B-12 (CYANOCOBALAMIN ) 1000 MCG tablet Take 1,000 mcg by mouth daily.   No facility-administered encounter medications on file as of 02/22/2024.    Allergies (verified) Clindamycin/lincomycin, Codeine , Cortisone, Dilaudid [hydromorphone hcl], Iodides, Reglan [metoclopramide], Tramadol, Trulicity  [dulaglutide ], and Tape   History: Past Medical History:  Diagnosis Date   Bronchitis, asthmatic    Coronary artery disease    BMS to LAD 02/2008   Diabetes mellitus without complication (HCC)    Hyperlipidemia    Hypertensive heart disease 04/16/2016    Hypothyroidism    MI, old    Morbid obesity (HCC) 04/16/2016   S/P coronary artery stent placement 04/16/2016   BMS to LAD 2009   Tobacco abuse 04/16/2016   Past Surgical History:  Procedure Laterality Date   ABDOMINAL HYSTERECTOMY     35; very heavy bleeding   ANKLE SURGERY Right    CHOLECYSTECTOMY     CORONARY ANGIOPLASTY  02/2008   bare metal stent to LAD    CORONARY STENT PLACEMENT     KNEE ARTHROSCOPY     OOPHORECTOMY Left    age 77   ROTATOR CUFF REPAIR Bilateral    THORACIC OUTLET SURGERY     WRIST SURGERY     Family History  Problem Relation Age of Onset   Cancer Mother    Diabetes Son    Hypertension Son    Social History   Socioeconomic History   Marital status: Married    Spouse name: Not on file   Number of children: Not on file   Years of education: Not on file   Highest education  level: Not on file  Occupational History   Occupation: Disabled   Tobacco Use   Smoking status: Every Day    Current packs/day: 0.25    Average packs/day: 0.3 packs/day for 31.0 years (7.8 ttl pk-yrs)    Types: Cigarettes   Smokeless tobacco: Never   Tobacco comments:    1 PPD  Vaping Use   Vaping status: Never Used  Substance and Sexual Activity   Alcohol use: No   Drug use: No   Sexual activity: Yes    Partners: Male  Other Topics Concern   Not on file  Social History Narrative   Originally from [place]      Still drives    Social Drivers of Health   Financial Resource Strain: Low Risk  (02/22/2024)   Overall Financial Resource Strain (CARDIA)    Difficulty of Paying Living Expenses: Not hard at all  Food Insecurity: No Food Insecurity (02/22/2024)   Hunger Vital Sign    Worried About Running Out of Food in the Last Year: Never true    Ran Out of Food in the Last Year: Never true  Transportation Needs: No Transportation Needs (02/22/2024)   PRAPARE - Administrator, Civil Service (Medical): No    Lack of Transportation (Non-Medical): No  Physical  Activity: Insufficiently Active (02/22/2024)   Exercise Vital Sign    Days of Exercise per Week: 4 days    Minutes of Exercise per Session: 30 min  Stress: No Stress Concern Present (02/22/2024)   [employer] of Occupational Health - Occupational Stress Questionnaire    Feeling of Stress : Not at all  Social Connections: Moderately Isolated (02/22/2024)   Social Connection and Isolation Panel [NHANES]    Frequency of Communication with Friends and Family: More than three times a week    Frequency of Social Gatherings with Friends and Family: Twice a week    Attends Religious Services: Never    Database administrator or Organizations: No    Attends Engineer, structural: Never    Marital Status: Married    Tobacco Counseling Ready to quit: Not Answered Counseling given: Not Answered Tobacco comments: 1 PPD    Clinical Intake:  Pre-visit preparation completed: Yes  Pain : No/denies pain Pain Score: 0-No pain     BMI - recorded: 49.42 Nutritional Status: BMI > 30  Obese Nutritional Risks: None Diabetes: Yes CBG done?: No Did pt. bring in CBG monitor from home?: No  Lab Results  Component Value Date   HGBA1C 8.1 (A) 02/17/2024   HGBA1C 8.1 (A) 09/21/2023   HGBA1C 7.9 (A) 05/29/2023     How often do you need to have someone help you when you read instructions, pamphlets, or other written materials from your doctor or pharmacy?: 1 - Never  Interpreter Needed?: No  Information entered by :: Juliann Ochoa   Activities of Daily Living   Patient Care Team: Allwardt, Deleta Felix, PA-C as PCP - General (Physician Assistant) Maudine Sos, MD as PCP - Cardiology (Cardiology) Barbara Levins, MD (Inactive) as Consulting Physician (Hematology and Oncology) Alix Aquas, MD as Consulting Physician (Orthopedic Surgery) Shermon Divine, MD as Consulting Physician (Sports Medicine) Wyonia Hefty, OD (Optometry) Randye Buttner, RN as VBCI Care  Management  I have updated your Care Teams any recent Medical Services you may have received from other providers in the past year.     Assessment:    This is a routine wellness examination for  Malory.  Hearing/Vision screen Hearing Screening - Comments:: Patient has no hearing difficulties Vision Screening - Comments:: Patient wears glasses    Goals Addressed             This Visit's Progress    Patient Stated   On track    Lose weight        Depression Screen     02/22/2024    2:46 PM 02/17/2024   12:14 PM 12/24/2023    1:06 PM 10/14/2023    3:06 PM 05/29/2023    1:06 PM 02/11/2023   12:54 PM 02/09/2023   10:24 AM  PHQ 2/9 Scores  PHQ - 2 Score 0 2 1 1 3  0 0  PHQ- 9 Score 0 5   5      Fall Risk     02/22/2024    2:44 PM 02/17/2024   12:14 PM 01/21/2024    9:14 PM 05/29/2023    1:06 PM 02/11/2023    1:01 PM  Fall Risk   Falls in the past year? 0 0 0 1 0  Number falls in past yr: 0 0 0 0   Injury with Fall? 0 0 0 0   Risk for fall due to : No Fall Risks No Fall Risks  No Fall Risks   Follow up Falls evaluation completed Falls evaluation completed       MEDICARE RISK AT HOME:  Medicare Risk at Home Any stairs in or around the home?: Yes If so, are there any without handrails?: No Home free of loose throw rugs in walkways, pet beds, electrical cords, etc?: Yes Adequate lighting in your home to reduce risk of falls?: Yes Life alert?: No Use of a cane, walker or w/c?: No Grab bars in the bathroom?: Yes Shower chair or bench in shower?: Yes Elevated toilet seat or a handicapped toilet?: Yes  TIMED UP AND GO:  Was the test performed?  No  Cognitive Function: 6CIT completed        02/22/2024    2:42 PM 02/09/2023   10:28 AM 01/27/2022   10:35 AM 01/21/2021   10:49 AM 11/21/2019    3:57 PM  6CIT Screen  What Year? 0 points 0 points 0 points 0 points 0 points  What month? 0 points 0 points 0 points 0 points 0 points  What time? 0 points 0 points 0 points  0 points   Count back from 20 0 points 0 points 0 points 0 points 0 points  Months in reverse 0 points 0 points 0 points 0 points 0 points  Repeat phrase 0 points 0 points 2 points 0 points 0 points  Total Score 0 points 0 points 2 points  0 points    Immunizations Immunization History  Administered Date(s) Administered   Fluad Quad(high Dose 65+) 06/01/2019, 07/30/2020, 07/16/2021, 06/12/2022   Fluad Trivalent(High Dose 65+) 05/29/2023   Influenza, High Dose Seasonal PF 07/19/2018   Influenza,inj,Quad PF,6+ Mos 07/24/2017   Moderna Sars-Covid-2 Vaccination 02/02/2020, 03/05/2020   PNEUMOCOCCAL CONJUGATE-20 02/03/2023   Pneumococcal Polysaccharide-23 12/19/2019   Tdap 06/22/2014    Screening Tests Health Maintenance  Topic Date Due   MAMMOGRAM  04/18/2024 (Originally 01/02/2024)   Zoster Vaccines- Shingrix (1 of 2) 05/19/2024 (Originally 06/16/1970)   OPHTHALMOLOGY EXAM  06/08/2024 (Originally 10/29/2022)   INFLUENZA VACCINE  04/22/2024   HEMOGLOBIN A1C  05/19/2024   DTaP/Tdap/Td (2 - Td or Tdap) 06/22/2024   Diabetic kidney evaluation - eGFR measurement  09/20/2024  Diabetic kidney evaluation - Urine ACR  09/20/2024   DEXA SCAN  01/19/2025   FOOT EXAM  02/16/2025   Medicare Annual Wellness (AWV)  02/21/2025   Colonoscopy  11/07/2026   Pneumonia Vaccine 99+ Years old  Completed   Hepatitis C Screening  Completed   HPV VACCINES  Aged Out   Meningococcal B Vaccine  Aged Out   COVID-19 Vaccine  Discontinued    Health Maintenance  There are no preventive care reminders to display for this patient. Health Maintenance Items Addressed:  Additional Screening:  Vision Screening: Recommended annual ophthalmology exams for early detection of glaucoma and other disorders of the eye. Would you like a referral to an eye doctor? No    Dental Screening: Recommended annual dental exams for proper oral hygiene  Community Resource Referral / Chronic Care Management: CRR required this visit?   No   CCM required this visit?  No   Plan:    I have personally reviewed and noted the following in the patient's chart:   Medical and social history Use of alcohol, tobacco or illicit drugs  Current medications and supplements including opioid prescriptions. Patient is not currently taking opioid prescriptions. Functional ability and status Nutritional status Physical activity Advanced directives List of other physicians Hospitalizations, surgeries, and ER visits in previous 12 months Vitals Screenings to include cognitive, depression, and falls Referrals and appointments  In addition, I have reviewed and discussed with patient certain preventive protocols, quality metrics, and best practice recommendations. A written personalized care plan for preventive services as well as general preventive health recommendations were provided to patient.   Freeda Jerry, [place]   02/22/2024   After Visit Summary: (MyChart) Due to this being a telephonic visit, the after visit summary with patients personalized plan was offered to patient via MyChart   Notes: Nothing significant to report at this time.

## 2024-02-29 ENCOUNTER — Telehealth: Payer: Self-pay | Admitting: Cardiovascular Disease

## 2024-02-29 MED ORDER — ISOSORBIDE MONONITRATE ER 60 MG PO TB24: 60.0000 mg | ORAL_TABLET | Freq: Every day | ORAL | 0 refills | Status: DC

## 2024-02-29 NOTE — Telephone Encounter (Signed)
 RX sent to requested Pharmacy

## 2024-02-29 NOTE — Telephone Encounter (Signed)
*  STAT* If patient is at the pharmacy, call can be transferred to refill team.   1. Which medications need to be refilled? (please list name of each medication and dose if known)  isosorbide  mononitrate (IMDUR ) 60 MG 24 hr tablet    2. Would you like to learn more about the convenience, safety, & potential cost savings by using the [facility]?    3. Are you open to using the [facility] (Type [facility].  ).   4. Which pharmacy/location (including street and city if local pharmacy) is medication to be sent to? [id number] - [place], [place] - [address] AT [facility] [address]    5. Do they need a 30 day or 90 day supply? 90 day

## 2024-03-01 ENCOUNTER — Other Ambulatory Visit (HOSPITAL_BASED_OUTPATIENT_CLINIC_OR_DEPARTMENT_OTHER): Payer: Self-pay | Admitting: Cardiovascular Disease

## 2024-03-05 ENCOUNTER — Other Ambulatory Visit: Payer: Self-pay | Admitting: Physician Assistant

## 2024-03-07 ENCOUNTER — Ambulatory Visit: Admitting: Podiatry

## 2024-03-07 NOTE — Telephone Encounter (Signed)
 Called Pharmacy because a 90 day supply was sent in on 03/02/24. The Pharmacy received it but accidentally processed incorrect RX. They are processing correct now. Pt contacted and verbalized understanding.

## 2024-03-07 NOTE — Telephone Encounter (Signed)
  Pt said, pharmacy got 30 days, she said she wont have enough until 04/18/24 appt. She requesting to get 90 days supply. She said, she haven't pick up the prescription and if someone can call her once the refill has been resent

## 2024-03-17 ENCOUNTER — Other Ambulatory Visit: Payer: Self-pay

## 2024-03-17 NOTE — Patient Outreach (Signed)
 Complex Care Management   Visit Note  03/17/2024  Name:  [name] [name] MRN: [id number] DOB: [birth year]  Situation: Referral received for Complex Care Management related to COPD and Diabetes with Complications I obtained verbal consent from Patient.  Visit completed with patient  on the phone  Background:   Past Medical History:  Diagnosis Date   Bronchitis, asthmatic    Coronary artery disease    BMS to LAD 02/2008   Diabetes mellitus without complication (HCC)    Hyperlipidemia    Hypertensive heart disease 04/16/2016   Hypothyroidism    MI, old    Morbid obesity (HCC) 04/16/2016   S/P coronary artery stent placement 04/16/2016   BMS to LAD 2009   Tobacco abuse 04/16/2016    Assessment: Patient Reported Symptoms:  Cognitive Cognitive Status: Alert and oriented to person, place, and time, Insightful and able to interpret abstract concepts, Normal speech and language skills Cognitive/Intellectual Conditions Management [RPT]: None reported or documented in medical history or problem list   Health Maintenance Behaviors: Annual physical exam Healing Pattern: Unsure Health Facilitated by: Healthy diet  Neurological Neurological Review of Symptoms: No symptoms reported    HEENT HEENT Symptoms Reported: No symptoms reported      Cardiovascular Cardiovascular Symptoms Reported: No symptoms reported Does patient have uncontrolled Hypertension?: No Cardiovascular Conditions: Hypertension, Coronary artery disease Cardiovascular Management Strategies: Medication therapy, Coping strategies, Diet modification, Adequate rest, Activity Cardiovascular Self-Management Outcome: 4 (good)  Respiratory Respiratory Symptoms Reported: No symptoms reported Respiratory Conditions: COPD, Asthma, Sleep disordered breathing Respiratory Self-Management Outcome: 4 (good)  Endocrine Patient reports the following symptoms related to hypoglycemia or hyperglycemia : No symptoms reported Is patient diabetic?:  Yes Is patient checking blood sugars at home?: Yes Endocrine Conditions: Diabetes, Vitamin D deficiency Endocrine Management Strategies: Adequate rest, Coping strategies, Medication therapy, Diet modification, Activity, Weight management Endocrine Self-Management Outcome: 3 (uncertain) Endocrine Comment: Has an appt to meet with an Endocrinologist on 04/27/24 - hopes to get her Diabetes under better control with A1c <7  Gastrointestinal Gastrointestinal Symptoms Reported: No symptoms reported Additional Gastrointestinal Details: suffers from Hepatic Steatosis Gastrointestinal Self-Management Outcome: 4 (good) Nutrition Risk Screen (CP): No indicators present  Genitourinary Genitourinary Symptoms Reported: No symptoms reported Genitourinary Conditions: Chronic kidney disease  Integumentary Integumentary Symptoms Reported: No symptoms reported    Musculoskeletal Musculoskelatal Symptoms Reviewed: Difficulty walking, Weakness, Unsteady gait Other Musculoskeletal Symptoms: uses a cane for balance issues Additional Musculoskeletal Details: diagnosed w/ Osteopenia - takes Calcium /D3 supplement daily Musculoskeletal Conditions: Back pain Other Musculoskeletal Conditions: Osteopenia Musculoskeletal Management Strategies: Coping strategies, Medication therapy, Routine screening, Diet modification, Adequate rest Musculoskeletal Self-Management Outcome: 4 (good) Falls in the past year?: No Number of falls in past year: 1 or less Was there an injury with Fall?: No Fall Risk Category Calculator: 0 Patient Fall Risk Level: Low Fall Risk    Psychosocial Psychosocial Symptoms Reported: No symptoms reported     Quality of Family Relationships: supportive, involved, helpful Do you feel physically threatened by others?: No      02/22/2024    2:46 PM  Depression screen PHQ 2/9  Decreased Interest 0  Down, Depressed, Hopeless 0  PHQ - 2 Score 0  Altered sleeping 0  Tired, decreased energy 0  Change  in appetite 0  Feeling bad or failure about yourself  0  Trouble concentrating 0  Moving slowly or fidgety/restless 0  Suicidal thoughts 0  PHQ-9 Score 0  Difficult doing work/chores Not difficult at all  There were no vitals filed for this visit.  Medications Reviewed Today     Reviewed by Gordy Channing LABOR, RN (Registered Nurse) on 03/17/24 at 1406  Med List Status: <None>   Medication Order Taking? Sig Documenting Provider Last Dose Status Informant  Accu-Chek FastClix Lancets MISC [id number] Yes USE TO TEST UP TO FOUR TIMES DAILY AS DIRECTED Waddell Rake, MD  Active   ACCU-CHEK GUIDE test strip [id number] Yes USE AS DIRECTED Allwardt, Alyssa M, PA-C  Active   albuterol  (ACCUNEB ) 1.25 MG/3ML nebulizer solution [id number] Yes TAKE 3 MLS BY NEBULIZATION THREE TIMES DAILY AS NEEDED FOR WHEEZING Allwardt, Alyssa M, PA-C  Active   albuterol  (VENTOLIN  HFA) 108 (90 Base) MCG/ACT inhaler [id number] Yes INHALE 2 PUFFS INTO THE LUNGS EVERY 6 HOURS AS NEEDED FOR WHEEZING OR SHORTNESS OF BREATH Allwardt, Alyssa M, PA-C  Active   ALPRAZolam  (XANAX ) 0.5 MG tablet [id number] Yes TAKE 1 TABLET(0.5 MG) BY MOUTH TWICE DAILY AS NEEDED FOR ANXIETY Allwardt, Alyssa M, PA-C  Active   aspirin  81 MG tablet [id number] Yes Take 81 mg by mouth daily. [provider]  Active Self  atorvastatin  (LIPITOR) 10 MG tablet [id number] Yes TAKE 1 TABLET(10 MG) BY MOUTH DAILY Raford Riggs, MD  Active   azithromycin  (ZITHROMAX  Z-PAK) 250 MG tablet [id number] Yes Take two tablets on day one, followed by one tablet daily for the next four days. Allwardt, Mardy HERO, PA-C  Active   blood glucose meter kit and supplies KIT [id number] Yes Dispense based on patient and insurance preference. Use up to four times daily as directed. DX E11.8 Allwardt, Mardy HERO, PA-C  Active   Budeson-Glycopyrrol-Formoterol  (BREZTRI  AEROSPHERE) 160-9-4.8 MCG/ACT AERO [id number] Yes Inhale 2 puffs into the lungs 2 (two) times daily. Allwardt,  Mardy HERO, PA-C  Active   Calcium  Carb-Cholecalciferol (CALCIUM  500 + D3 PO) [id number] Yes Take by mouth. [provider]  Active   fluconazole  (DIFLUCAN ) 150 MG tablet [id number] Yes Take 1 tab 72 hours after first if still symptomatic Allwardt, Alyssa M, PA-C  Active   furosemide  (LASIX ) 20 MG tablet [id number] Yes TAKE AS DIRECTED THREE TIMES WEEK Allwardt, Alyssa M, PA-C  Active   glipiZIDE  (GLUCOTROL  XL) 10 MG 24 hr tablet [id number] Yes Take 1 tablet (10 mg total) by mouth daily with breakfast. Allwardt, Mardy HERO, PA-C  Active   INVOKANA  100 MG TABS tablet [id number] Yes Take 100 mg by mouth every morning. [provider]  Active            Med Note JUSTINO, TAMMY B   Thu Oct 15, 2023  7:18 AM) Approved for [employer] medication assistance program thru 09/21/2024  ipratropium (ATROVENT ) 0.06 % nasal spray [id number] Yes USE 2 SPRAYS IN EACH NOSTRIL FOUR TIMES DAILY AS DIRECTED Allwardt, Alyssa M, PA-C  Active   ipratropium-albuterol  (DUONEB) 0.5-2.5 (3) MG/3ML SOLN [id number] Yes Take 3 mLs by nebulization every 6 (six) hours as needed. Allwardt, Alyssa M, PA-C  Active   isosorbide  mononitrate (IMDUR ) 60 MG 24 hr tablet [id number] Yes TAKE 1 TABLET(60 MG) BY MOUTH DAILY Raford Riggs, MD  Active   levothyroxine  (SYNTHROID ) 125 MCG tablet [id number] Yes Take 1 tablet (125 mcg total) by mouth daily before breakfast. Allwardt, Mardy HERO, PA-C  Active   meloxicam  (MOBIC ) 7.5 MG tablet [id number] Yes Take 1 tablet (7.5 mg total) by mouth daily. Allwardt, Mardy HERO, PA-C  Active   Menthol, Topical Analgesic, (BIOFREEZE) 4 % GEL [id number] Yes Apply topically. [provider]  Active   Misc. Devices (PULSE OXIMETER FOR FINGER) MISC [id number] Yes Use as needed to check pulse oximetry. Allwardt, Alyssa M, PA-C  Active   Olopatadine  HCl 0.2 % SOLN [id number] Yes Apply 1 drop to eye 2 (two) times daily. Allwardt, Alyssa M, PA-C  Active   pantoprazole  (PROTONIX ) 40 MG tablet [id number] Yes  TAKE 1 TABLET(40 MG) BY MOUTH DAILY Allwardt, Alyssa M, PA-C  Active   vitamin B-12 (CYANOCOBALAMIN ) 1000 MCG tablet [id number] Yes Take 1,000 mcg by mouth daily. [provider]  Active             Recommendation:   Specialty provider follow-up 04/27/24 with Endocrinologist Dr. Obadiah Birmingham (new patient)  Follow Up Plan:   Telephone follow up appointment date/time:  04/20/24 @ 2pm  Channing A. Gordy RN, BA, [facility], CRRN [place]  [facility] Population Health RN Care Manager Direct Dial: [phone]  Fax: [phone]

## 2024-03-17 NOTE — Patient Instructions (Signed)
 Visit Information  Thank you for taking time to visit with me today. Please don't hesitate to contact me if I can be of assistance to you before our next scheduled telephone appointment.  Our next appointment is by telephone on 7/30 at 2pm  Following is a copy of your care plan:   Goals Addressed             This Visit's Progress    VBCI RN Care Plan       Problems:  Chronic Disease Management support and education needs related to COPD and DMII  Goal: Over the next 3 months the Patient will continue to work with RN Care Manager and/or Social Worker to address care management and care coordination needs related to COPD and DMII as evidenced by adherence to care management team scheduled appointments      Interventions:   COPD Interventions: Advised patient to track and manage COPD triggers Provided instruction about proper use of medications used for management of COPD including inhalers Advised patient to self assesses COPD action plan zone and make appointment with provider if in the yellow zone for 48 hours without improvement Advised patient to engage in light exercise as tolerated 3-5 days a week to aid in the the management of COPD Provided education about and advised patient to utilize infection prevention strategies to reduce risk of respiratory infection Discussed the importance of adequate rest and management of fatigue with COPD Screening for signs and symptoms of depression related to chronic disease state  Assessed social determinant of health barriers   Diabetes Interventions: Assessed patient's understanding of A1c goal: <7% Provided education to patient about basic DM disease process Reviewed medications with patient and discussed importance of medication adherence Counseled on importance of regular laboratory monitoring as prescribed Discussed plans with patient for ongoing care management follow up and provided patient with direct contact information for care  management team Advised patient, providing education and rationale, to check cbg at least twice daily and record, calling PCP for findings outside established parameters Review of patient status, including review of consultants reports, relevant laboratory and other test results, and medications completed Lab Results  Component Value Date   HGBA1C 8.1 (A) 09/21/2023  02/17/2024 A1C = 8.1 (no change from 09/21/23.) PCP referred patient to Endocrinologist, Dr. Obadiah Birmingham for specialist consultation re patient's DM2 management. Pt has appt for 04/27/2024 @ 1pm.  Patient Self-Care Activities:  Attend all scheduled provider appointments Attend church or other social activities Call pharmacy for medication refills 3-7 days in advance of running out of medications Call provider office for new concerns or questions  Perform all self care activities independently  Take medications as prescribed   Work with the pharmacist to address medication management needs and will continue to work with the clinical team to address health care and disease management related needs schedule appointment with eye doctor check feet daily for cuts, sores or redness take the blood sugar log to all doctor visits drink 6 to 8 glasses of water each day eat fish at least once per week fill half of plate with vegetables identify and avoid work-related triggers identify and remove indoor air pollutants limit outdoor activity during cold weather listen for public air quality announcements every day begin a symptom diary develop a rescue plan eliminate symptom triggers at home follow rescue plan if symptoms flare-up use an extra pillow to sleep use devices that will help like a cane, sock-puller or reacher  Plan:  The patient  has been provided with contact information for the care management team and has been advised to call with any health related questions or concerns. Next appointment with RNCM Channing is 7/30 @  2pm             Patient verbalizes understanding of instructions and care plan provided today and agrees to view in MyChart. Active MyChart status and patient understanding of how to access instructions and care plan via MyChart confirmed with patient.     The patient has been provided with contact information for the care management team and has been advised to call with any health related questions or concerns.   Please call the care guide team at [phone] if you need to cancel or reschedule your appointment.   Please call 1-800-273-TALK (toll free, 24 hour hotline) if you are experiencing a Mental Health or Behavioral Health Crisis or need someone to talk to.  Jemima Petko A. Gordy RN, BA, [facility], CRRN [place]  [facility] Population Health RN Care Manager Direct Dial: [phone]  Fax: [phone]

## 2024-04-14 ENCOUNTER — Other Ambulatory Visit: Payer: Self-pay | Admitting: Physician Assistant

## 2024-04-14 DIAGNOSIS — J4541 Moderate persistent asthma with (acute) exacerbation: Secondary | ICD-10-CM

## 2024-04-14 NOTE — Progress Notes (Deleted)
 Cardiology Office Note   Date:  04/14/2024  ID:  Demiya, Magno 05-29-1951, MRN [id number] PCP: Allwardt, Mardy CHRISTELLA, PA-C  [facility] Providers Cardiologist:  Annabella Scarce, MD {    History of Present Illness [name] [name] is a 73 y.o. female with a past medical history of CAD status post BMS to LAD in 2009 and diabetes mellitus with alcohol patient, hypertension, hyperlipidemia, hypothyroidism, and tobacco abuse here for follow-up appointment.  Prior Cardiolite/18/14 negative for ischemia, LVEF 79%.  Last seen 02/20/2023.  She noted exertional dyspnea with minimal activity such as showering.  Exacerbated by sciatic nerve.  She endorsed smoking but cut way back.  Has had weight gain and is hopeful to start exercising in her pool at home.  Follows with primary care regarding her asthma.  Uses Symbicort  regularly and also has albuterol  inhaler and nebulizer.  Using rescue inhaler twice per week with improvement in breathing.  Reports rare pain in her right breast with prior anginal equivalent on the left side which has not reoccurred.  Takes Lasix  every other week for lower extremity edema.  No orthopnea or PND.  Today, she ***  ROS: ***  Studies Reviewed      STRESS TESTS   MYOCARDIAL PERFUSION IMAGING 03/02/2019   Narrative  Nuclear stress EF: 79%. The left ventricular ejection fraction is hyperdynamic (>65%). The apex was not visualized. Suggest echocardiogram for further evaluation  There was no ST segment deviation noted during stress.  Defect 1: There is a small defect of moderate severity present in the apical anterior, apical septal and apex location. This appears to be c/w a previous apical MI with perhaps a small amount of peri-infarct ischemi a  Findings consistent with prior apical myocardial infarction.  This is a low risk study.   ECHOCARDIOGRAM   ECHOCARDIOGRAM COMPLETE 03/11/2019   Narrative ECHOCARDIOGRAM REPORT       Patient Name:   [name] [name]    Date of Exam: 03/11/2019 Medical Rec #:  [id number]     Height:       65.0 in Accession #:    [id number]    Weight:       286.0 lb Date of Birth:  Sep 16, 1951     BSA:          2.30 m Patient Age:    67 years      BP:           136/86 mmHg Patient Gender: F             HR:           86 bpm. Exam Location:  [address]     Procedure: 2D Echo, Cardiac Doppler, Color Doppler and Intracardiac Opacification Agent   Indications:    R07.9 Chest pain I25.10 Coronary artery disease.   History:        Patient has no prior history of Echocardiogram examinations. Risk Factors: Morbid Obesity, Current Smoker, Hypertension and Diabetes. Myocardial infarction. Chest pain. Anxiety. Coronary artery disease. Hypothyroidism.   Sonographer:    Carl Coma RDCS Referring Phys: [id number] CALLIE E GOODRICH   IMPRESSIONS     1. The left ventricle has hyperdynamic systolic function, with an ejection fraction of >65%. The cavity size was normal. Left ventricular diastolic Doppler parameters are consistent with impaired relaxation. 2. The right ventricle has normal systolic function. The cavity was normal. 3. The mitral valve is grossly normal. There is moderate mitral annular calcification present. 4. The  tricuspid valve is grossly normal. 5. The aortic valve was not well visualized. No stenosis of the aortic valve. 6. Technically difficult; definity  used; vigorous LV systolic function; mild diastolic dysfunction.   FINDINGS Left Ventricle: The left ventricle has hyperdynamic systolic function, with an ejection fraction of >65%. The cavity size was normal. There is no increase in left ventricular wall thickness. Left ventricular diastolic Doppler parameters are consistent with impaired relaxation. Definity  contrast agent was given IV to delineate the left ventricular endocardial borders.   Right Ventricle: The right ventricle has normal systolic function. The cavity was normal.   Left Atrium:  Left atrial size was normal in size.   Right Atrium: Right atrial size was normal in size.   Interatrial Septum: No atrial level shunt detected by color flow Doppler.   Pericardium: There is no evidence of pericardial effusion.   Mitral Valve: The mitral valve is grossly normal. There is moderate mitral annular calcification present. Mitral valve regurgitation is not visualized by color flow Doppler.   Tricuspid Valve: The tricuspid valve is grossly normal. Tricuspid valve regurgitation was not visualized by color flow Doppler.   Aortic Valve: The aortic valve was not well visualized Aortic valve regurgitation was not visualized by color flow Doppler. There is No stenosis of the aortic valve.   Pulmonic Valve: The pulmonic valve was not well visualized. Pulmonic valve regurgitation is not visualized by color flow Doppler.   Venous: The inferior vena cava was not well visualized.   Additional Comments: Technically difficult; definity  used; vigorous LV systolic function; mild diastolic dysfunction.     +--------------+--------++ LEFT VENTRICLE         +----------------+---------++ +--------------+--------++ Diastology                PLAX 2D                +----------------+---------++ +--------------+--------++ LV e' lateral:  6.74 cm/s LVIDd:        3.90 cm  +----------------+---------++ +--------------+--------++ LV E/e' lateral:11.4      LVIDs:        2.20 cm  +----------------+---------++ +--------------+--------++ LV e' medial:   6.42 cm/s LV PW:        0.70 cm  +----------------+---------++ +--------------+--------++ LV E/e' medial: 12.0      LV IVS:       1.00 cm  +----------------+---------++ +--------------+--------++ LVOT diam:    1.70 cm  +--------------+--------++ LV SV:        50 ml    +--------------+--------++ LV SV Index:  19.77    +--------------+--------++ LVOT Area:    2.27  cm +--------------+--------++                        +--------------+--------++   +---------------+----------++ RIGHT VENTRICLE           +---------------+----------++ RV Basal diam: 3.90 cm    +---------------+----------++ RV S prime:    14.10 cm/s +---------------+----------++ TAPSE (M-mode):1.8 cm     +---------------+----------++   +---------------+-------++-----------++ LEFT ATRIUM           Index       +---------------+-------++-----------++ LA diam:       4.30 cm1.87 cm/m  +---------------+-------++-----------++ LA Vol (A2C):  32.2 ml13.98 ml/m +---------------+-------++-----------++ LA Vol (A4C):  36.4 ml15.81 ml/m +---------------+-------++-----------++ LA Biplane Vol:36.0 ml15.63 ml/m +---------------+-------++-----------++ +------------+--------++----------++ RIGHT ATRIUM        Index      +------------+--------++----------++ RA Area:    9.71 cm           +------------+--------++----------++  RA Volume:  19.80 ml8.60 ml/m +------------+--------++----------++ +------------+-----------++ AORTIC VALVE            +------------+-----------++ LVOT Vmax:  117.50 cm/s +------------+-----------++ LVOT Vmean: 91.150 cm/s +------------+-----------++ LVOT VTI:   0.262 m     +------------+-----------++   +-------------+-------++ AORTA                +-------------+-------++ Ao Root diam:2.90 cm +-------------+-------++   +--------------+----------++ MITRAL VALVE              +--------------+-------+ +--------------+----------++  SHUNTS                MV Area (PHT):2.80 cm    +--------------+-------+ +--------------+----------++  Systemic VTI: 0.26 m  MV PHT:       78.59 msec  +--------------+-------+ +--------------+----------++  Systemic Diam:1.70 cm MV Decel Time:271 msec     +--------------+-------+ +--------------+----------++ +--------------+-----------++ MV E velocity:77.10 cm/s  +--------------+-----------++ MV A velocity:118.00 cm/s +--------------+-----------++ MV E/A ratio: 0.65        +--------------+-----------++     Redell Shallow MD Electronically signed by Redell Shallow MD Signature Date/Time: 03/11/2019/12:53:54 PM       Final       Risk Assessment/Calculations {Does this patient have ATRIAL FIBRILLATION?:[id number]} No BP recorded.  {Refresh Note OR Click here to enter BP  :1}***       Physical Exam VS:  LMP  (LMP Unknown)        Wt Readings from Last 3 Encounters:  02/22/24 297 lb (134.7 kg)  02/17/24 296 lb 9.6 oz (134.5 kg)  11/16/23 293 lb 12.8 oz (133.3 kg)    GEN: Well nourished, well developed in no acute distress NECK: No JVD; No carotid bruits CARDIAC: ***RRR, no murmurs, rubs, gallops RESPIRATORY:  Clear to auscultation without rales, wheezing or rhonchi  ABDOMEN: Soft, non-tender, non-distended EXTREMITIES:  No edema; No deformity   ASSESSMENT AND PLAN CAD with BMS to LAD 02/2008 HLD DM2 Morbid obesity Tobacco use    {Are you ordering a CV Procedure (e.g. stress test, cath, DCCV, TEE, etc)?   Press F2        :[id number]}  Dispo: ***  Signed, Orren LOISE Fabry, PA-C

## 2024-04-18 ENCOUNTER — Ambulatory Visit: Admitting: Physician Assistant

## 2024-04-18 DIAGNOSIS — Z955 Presence of coronary angioplasty implant and graft: Secondary | ICD-10-CM

## 2024-04-18 DIAGNOSIS — I25118 Atherosclerotic heart disease of native coronary artery with other forms of angina pectoris: Secondary | ICD-10-CM

## 2024-04-18 DIAGNOSIS — E785 Hyperlipidemia, unspecified: Secondary | ICD-10-CM

## 2024-04-18 DIAGNOSIS — F17210 Nicotine dependence, cigarettes, uncomplicated: Secondary | ICD-10-CM

## 2024-04-20 ENCOUNTER — Other Ambulatory Visit: Payer: Self-pay

## 2024-04-21 NOTE — Patient Outreach (Signed)
 Complex Care Management   Visit Note  04/20/2024  Name:  [name] [name] MRN: [id number] DOB: 1951/02/20  Situation: Referral received for Complex Care Management related to COPD and Diabetes with Complications I obtained verbal consent from Patient.  Visit completed with patient  on the phone  Background:   Past Medical History:  Diagnosis Date   Bronchitis, asthmatic    Coronary artery disease    BMS to LAD 02/2008   Diabetes mellitus without complication (HCC)    Hyperlipidemia    Hypertensive heart disease 04/16/2016   Hypothyroidism    MI, old    Morbid obesity (HCC) 04/16/2016   S/P coronary artery stent placement 04/16/2016   BMS to LAD 2009   Tobacco abuse 04/16/2016    Assessment: Patient Reported Symptoms:  Cognitive Cognitive Status: Alert and oriented to person, place, and time, Insightful and able to interpret abstract concepts, Normal speech and language skills Cognitive/Intellectual Conditions Management [RPT]: None reported or documented in medical history or problem list      Neurological Neurological Review of Symptoms: No symptoms reported    HEENT HEENT Symptoms Reported: No symptoms reported      Cardiovascular Cardiovascular Symptoms Reported: No symptoms reported    Respiratory Respiratory Symptoms Reported: Shortness of breath Other Respiratory Symptoms: Has )2 tank for prn use both upstairs and downstairs of her home, also utilizes nebs when dyspneic    Endocrine Endocrine Symptoms Reported: No symptoms reported Is patient diabetic?: Yes Is patient checking blood sugars at home?: Yes Endocrine Self-Management Outcome: 3 (uncertain) Endocrine Comment: Sees Endocrinologist Dr. Obadiah Birmingham as a new patient on 04/27/24 regarding improving her managemnt of diabetes, most recent A1c = 8, hopes to get A1c <7  Gastrointestinal Gastrointestinal Symptoms Reported: No symptoms reported      Genitourinary Genitourinary Symptoms Reported: No symptoms reported     Integumentary Integumentary Symptoms Reported: No symptoms reported    Musculoskeletal Musculoskelatal Symptoms Reviewed: Difficulty walking, Unsteady gait, Weakness Other Musculoskeletal Symptoms: uses a cane for better balance Musculoskeletal Management Strategies: Coping strategies, Medication therapy, Routine screening, Diet modification, Adequate rest Musculoskeletal Self-Management Outcome: 4 (good) Falls in the past year?: No    Psychosocial Psychosocial Symptoms Reported: No symptoms reported     Quality of Family Relationships: supportive, involved, helpful Do you feel physically threatened by others?: No      02/22/2024    2:46 PM  Depression screen PHQ 2/9  Decreased Interest 0  Down, Depressed, Hopeless 0  PHQ - 2 Score 0  Altered sleeping 0  Tired, decreased energy 0  Change in appetite 0  Feeling bad or failure about yourself  0  Trouble concentrating 0  Moving slowly or fidgety/restless 0  Suicidal thoughts 0  PHQ-9 Score 0  Difficult doing work/chores Not difficult at all    There were no vitals filed for this visit.  Medications Reviewed Today     Reviewed by Gordy Channing LABOR, RN (Registered Nurse) on 04/20/24 at 1432  Med List Status: <None>   Medication Order Taking? Sig Documenting Provider Last Dose Status Informant  Accu-Chek FastClix Lancets MISC [id number] Yes USE TO TEST UP TO FOUR TIMES DAILY AS DIRECTED Waddell Rake, MD  Active   ACCU-CHEK GUIDE test strip [id number] Yes USE AS DIRECTED Allwardt, Alyssa M, PA-C  Active   albuterol  (ACCUNEB ) 1.25 MG/3ML nebulizer solution [id number] Yes TAKE 3 MLS BY NEBULIZATION THREE TIMES DAILY AS NEEDED FOR WHEEZING Allwardt, Alyssa M, PA-C  Active   albuterol  (VENTOLIN  HFA)  108 (90 Base) MCG/ACT inhaler [id number] Yes INHALE 2 PUFFS INTO THE LUNGS EVERY 6 HOURS AS NEEDED FOR WHEEZING OR SHORTNESS OF BREATH Allwardt, Alyssa M, PA-C  Active   ALPRAZolam  (XANAX ) 0.5 MG tablet [id number] Yes TAKE 1 TABLET(0.5 MG)  BY MOUTH TWICE DAILY AS NEEDED FOR ANXIETY Allwardt, Alyssa M, PA-C  Active   aspirin  81 MG tablet [id number] Yes Take 81 mg by mouth daily. [provider]  Active Self  atorvastatin  (LIPITOR) 10 MG tablet [id number] Yes TAKE 1 TABLET(10 MG) BY MOUTH DAILY Raford Riggs, MD  Active   azithromycin  (ZITHROMAX  Z-PAK) 250 MG tablet [id number]  Take two tablets on day one, followed by one tablet daily for the next four days.  Patient not taking: Reported on 04/20/2024   Allwardt, Mardy HERO, PA-C  Active   blood glucose meter kit and supplies KIT [id number] Yes Dispense based on patient and insurance preference. Use up to four times daily as directed. DX E11.8 Allwardt, Mardy HERO, PA-C  Active   Budeson-Glycopyrrol-Formoterol  (BREZTRI  AEROSPHERE) 160-9-4.8 MCG/ACT AERO [id number] Yes Inhale 2 puffs into the lungs 2 (two) times daily. Allwardt, Mardy HERO, PA-C  Active   Calcium  Carb-Cholecalciferol (CALCIUM  500 + D3 PO) [id number] Yes Take by mouth. [provider]  Active   fluconazole  (DIFLUCAN ) 150 MG tablet [id number]  Take 1 tab 72 hours after first if still symptomatic  Patient not taking: Reported on 04/20/2024   Allwardt, Mardy HERO, PA-C  Active   furosemide  (LASIX ) 20 MG tablet [id number]  TAKE AS DIRECTED THREE TIMES WEEK Allwardt, Alyssa M, PA-C  Active   glipiZIDE  (GLUCOTROL  XL) 10 MG 24 hr tablet [id number] Yes Take 1 tablet (10 mg total) by mouth daily with breakfast. Allwardt, Mardy HERO, PA-C  Active   INVOKANA  100 MG TABS tablet [id number] Yes Take 100 mg by mouth every morning. [provider]  Active            Med Note JUSTINO, TAMMY B   Thu Oct 15, 2023  7:18 AM) Approved for [employer] medication assistance program thru 09/21/2024  ipratropium (ATROVENT ) 0.06 % nasal spray [id number] Yes USE 2 SPRAYS IN EACH NOSTRIL FOUR TIMES DAILY AS DIRECTED Allwardt, Alyssa M, PA-C  Active   ipratropium-albuterol  (DUONEB) 0.5-2.5 (3) MG/3ML SOLN [id number] Yes Take 3 mLs by nebulization  every 6 (six) hours as needed. Allwardt, Alyssa M, PA-C  Active   isosorbide  mononitrate (IMDUR ) 60 MG 24 hr tablet [id number] Yes TAKE 1 TABLET(60 MG) BY MOUTH DAILY Raford Riggs, MD  Active   levothyroxine  (SYNTHROID ) 125 MCG tablet [id number] Yes Take 1 tablet (125 mcg total) by mouth daily before breakfast. Allwardt, Mardy HERO, PA-C  Active   meloxicam  (MOBIC ) 7.5 MG tablet [id number] Yes Take 1 tablet (7.5 mg total) by mouth daily. Allwardt, Mardy HERO, PA-C  Active   Menthol, Topical Analgesic, (BIOFREEZE) 4 % GEL [id number] Yes Apply topically. [provider]  Active   Misc. Devices (PULSE OXIMETER FOR FINGER) MISC [id number] Yes Use as needed to check pulse oximetry. Allwardt, Alyssa M, PA-C  Active   Olopatadine  HCl 0.2 % SOLN [id number] Yes Apply 1 drop to eye 2 (two) times daily. Allwardt, Alyssa M, PA-C  Active   pantoprazole  (PROTONIX ) 40 MG tablet [id number] Yes TAKE 1 TABLET(40 MG) BY MOUTH DAILY Allwardt, Alyssa M, PA-C  Active   vitamin B-12 (CYANOCOBALAMIN ) 1000 MCG tablet [id number] Yes Take 1,000 mcg by mouth daily. [provider]  Active  Recommendation:   Specialty provider follow-up with Endocrinologist Dr. Dartha on 04/27/24 @ 1pm  Follow Up Plan:   Telephone follow up appointment date/time:  05/09/2024 @ 2pm with RN Care Manager Channing LITTIE Channing A. Gordy RN, BA, [facility], CRRN [place]  [facility] Population Health RN Care Manager Direct Dial: [phone]  Fax: [phone]

## 2024-04-21 NOTE — Patient Instructions (Signed)
 Visit Information  Thank you for taking time to visit with me today. Please don't hesitate to contact me if I can be of assistance to you before our next scheduled telephone appointment.  Our next appointment is by telephone on 05/09/24 at 2pm  Following is a copy of your care plan:   Goals Addressed             This Visit's Progress    VBCI RN Care Plan       Problems:  Chronic Disease Management support and education needs related to COPD and DMII  Goal: Over the next 3 months the Patient will continue to work with RN Care Manager and/or Social Worker to address care management and care coordination needs related to COPD and DMII as evidenced by adherence to care management team scheduled appointments      Interventions:   COPD Interventions: Advised patient to track and manage COPD triggers Provided instruction about proper use of medications used for management of COPD including inhalers Advised patient to self assesses COPD action plan zone and make appointment with provider if in the yellow zone for 48 hours without improvement Advised patient to engage in light exercise as tolerated 3-5 days a week to aid in the the management of COPD Provided education about and advised patient to utilize infection prevention strategies to reduce risk of respiratory infection Discussed the importance of adequate rest and management of fatigue with COPD Screening for signs and symptoms of depression related to chronic disease state  Assessed social determinant of health barriers   Diabetes Interventions: Assessed patient's understanding of A1c goal: <7% Provided education to patient about basic DM disease process Reviewed medications with patient and discussed importance of medication adherence Counseled on importance of regular laboratory monitoring as prescribed Discussed plans with patient for ongoing care management follow up and provided patient with direct contact information for  care management team Advised patient, providing education and rationale, to check cbg at least twice daily and record, calling PCP for findings outside established parameters Review of patient status, including review of consultants reports, relevant laboratory and other test results, and medications completed Lab Results  Component Value Date   HGBA1C 8.1 (A) 09/21/2023  02/17/2024 A1C = 8.1 (no change from 09/21/23.) PCP referred patient to Endocrinologist, Dr. Obadiah Birmingham for specialist consultation re patient's DM2 management. Pt has appt for 04/27/2024 @ 1pm.  Patient Self-Care Activities:  Attend all scheduled provider appointments Attend church or other social activities Call pharmacy for medication refills 3-7 days in advance of running out of medications Call provider office for new concerns or questions  Perform all self care activities independently  Take medications as prescribed   Work with the pharmacist to address medication management needs and will continue to work with the clinical team to address health care and disease management related needs schedule appointment with eye doctor check feet daily for cuts, sores or redness take the blood sugar log to all doctor visits drink 6 to 8 glasses of water each day eat fish at least once per week fill half of plate with vegetables identify and avoid work-related triggers identify and remove indoor air pollutants limit outdoor activity during cold weather listen for public air quality announcements every day begin a symptom diary develop a rescue plan eliminate symptom triggers at home follow rescue plan if symptoms flare-up use an extra pillow to sleep use devices that will help like a cane, sock-puller or reacher  Plan:  The patient  has been provided with contact information for the care management team and has been advised to call with any health related questions or concerns. Next appointment with RNCM [name] [name] is 8/18 @  2pm             Patient verbalizes understanding of instructions and care plan provided today and agrees to view in MyChart. Active MyChart status and patient understanding of how to access instructions and care plan via MyChart confirmed with patient.     The patient has been provided with contact information for the care management team and has been advised to call with any health related questions or concerns.   Please call the care guide team at [phone] if you need to cancel or reschedule your appointment.   Please call 1-800-273-TALK (toll free, 24 hour hotline) if you are experiencing a Mental Health or Behavioral Health Crisis or need someone to talk to.  Deanndra Kirley A. Gordy RN, BA, [facility], CRRN [place]  [facility] Population Health RN Care Manager Direct Dial: [phone]  Fax: [phone]

## 2024-04-27 ENCOUNTER — Ambulatory Visit: Admitting: "Endocrinology

## 2024-05-04 NOTE — Progress Notes (Deleted)
 Cardiology Office Note:    Date:  05/04/2024   ID:  [name] [name], [name] [name] March 31, 1951, MRN [id number]  PCP:  Allwardt, [name] CHRISTELLA, PA-C   [facility] Providers Cardiologist:  [name] Scarce, MD { Click to update primary MD,subspecialty MD or APP then REFRESH:1}    Referring MD: Allwardt, [name] CHRISTELLA, PA-C   Chief complaint: 1 year follow-up  History of Present Illness:    [name] [name] is a 73 y.o. female with a hx of CAD s/p PCI (02/2008 BMS-LAD in [place]), morbid obesity, chronic bronchitis, hypothyroidism, hyperlipidemia, T2DM, and tobacco use who presents today for her 1 year follow-up.  Cardiolite on 01/07/2013 negative for ischemia, LVEF EF was 79%.  She has a busy family life with multiple people living under the same household.  Husband suffers from PTSD.  Patient reports she did see a pulmonologist who told her she does not have COPD, uses Symbicort  to manage her difficulties with breathing.  On evaluation by [name] [name] in August 2022 she was offered a referral to prepped and healthy weight and wellness clinic, patient denied due to her ability to exercise being limited by her back pain.  Subsequent cardiology follow-up on 02/20/2023 revealed the patient was stable with no anginal symptoms and no indication for ischemic evaluation.  Medical therapies were continued at that time including aspirin , Imdur , atorvastatin , with healthy lifestyle changes advised.  LDL goal <70, at that time LDL was 41, advised to continue atorvastatin .  T2DM being managed by PCP, patient advised to stop tobacco use.   Evaluate sleeping habits for possible OSA d/t chronic polycythemic trends and obesity  CAD with BMS to LAD 02/2008 -  - Aspirin  - Imdur  - Atorvastatin  Hyperlipidemia, LDL goal <70 -Atorvastatin  Elevated triglycerides  - *** ?  Icosapent Ethel ? T2DM  -Managed by PCP      Past Medical History:  Diagnosis Date   Bronchitis, asthmatic    Coronary artery disease    BMS to LAD  02/2008   Diabetes mellitus without complication (HCC)    Hyperlipidemia    Hypertensive heart disease 04/16/2016   Hypothyroidism    MI, old    Morbid obesity (HCC) 04/16/2016   S/P coronary artery stent placement 04/16/2016   BMS to LAD 2009   Tobacco abuse 04/16/2016    Past Surgical History:  Procedure Laterality Date   ABDOMINAL HYSTERECTOMY     35; very heavy bleeding   ANKLE SURGERY Right    CHOLECYSTECTOMY     CORONARY ANGIOPLASTY  02/2008   bare metal stent to LAD    CORONARY STENT PLACEMENT     KNEE ARTHROSCOPY     OOPHORECTOMY Left    age 40   ROTATOR CUFF REPAIR Bilateral    THORACIC OUTLET SURGERY     WRIST SURGERY      Current Medications: No outpatient medications have been marked as taking for the 05/06/24 encounter (Appointment) with [name] Reche RAMAN, NP.     Allergies:   Clindamycin/lincomycin, Codeine , Cortisone, Dilaudid [hydromorphone hcl], Iodides, Reglan [metoclopramide], Tramadol, Trulicity  [dulaglutide ], and Tape   Social History   Socioeconomic History   Marital status: Married    Spouse name: Not on file   Number of children: Not on file   Years of education: Not on file   Highest education level: Not on file  Occupational History   Occupation: Disabled   Tobacco Use   Smoking status: Every Day    Current packs/day: 0.25    Average  packs/day: 0.3 packs/day for 31.0 years (7.8 ttl pk-yrs)    Types: Cigarettes   Smokeless tobacco: Never   Tobacco comments:    1 PPD  Vaping Use   Vaping status: Never Used  Substance and Sexual Activity   Alcohol use: No   Drug use: No   Sexual activity: Yes    Partners: Male  Other Topics Concern   Not on file  Social History Narrative   Originally from [place]      Still drives    Social Drivers of Health   Financial Resource Strain: Low Risk  (02/22/2024)   Overall Financial Resource Strain (CARDIA)    Difficulty of Paying Living Expenses: Not hard at all  Food Insecurity: No Food Insecurity  (04/20/2024)   Hunger Vital Sign    Worried About Running Out of Food in the Last Year: Never true    Ran Out of Food in the Last Year: Never true  Transportation Needs: No Transportation Needs (04/20/2024)   PRAPARE - Administrator, Civil Service (Medical): No    Lack of Transportation (Non-Medical): No  Physical Activity: Insufficiently Active (02/22/2024)   Exercise Vital Sign    Days of Exercise per Week: 4 days    Minutes of Exercise per Session: 30 min  Stress: No Stress Concern Present (02/22/2024)   [employer] of Occupational Health - Occupational Stress Questionnaire    Feeling of Stress : Not at all  Social Connections: Moderately Isolated (02/22/2024)   Social Connection and Isolation Panel    Frequency of Communication with Friends and Family: More than three times a week    Frequency of Social Gatherings with Friends and Family: Twice a week    Attends Religious Services: Never    Database administrator or Organizations: No    Attends Engineer, structural: Never    Marital Status: Married     Family History: The patient's ***family history includes Cancer in her mother; Diabetes in her son; Hypertension in her son.  ROS:   Please see the history of present illness.    *** All other systems reviewed and are negative.  EKGs/Labs/Other Studies Reviewed:    The following studies were reviewed today: ***      Recent Labs: 09/21/2023: ALT 17; BUN 21; Creatinine, Ser 1.20; Hemoglobin 16.8; Platelets 152.0; Potassium 4.4; Sodium 139 02/11/2024: TSH 2.03  Recent Lipid Panel    Component Value Date/Time   CHOL 130 09/21/2023 1322   TRIG 201.0 (H) 09/21/2023 1322   HDL 44.00 09/21/2023 1322   CHOLHDL 3 09/21/2023 1322   VLDL 40.2 (H) 09/21/2023 1322   LDLCALC 46 09/21/2023 1322   LDLCALC 57 04/26/2020 1208   LDLDIRECT 60.0 05/16/2019 1055     Risk Assessment/Calculations:   {Does this patient have ATRIAL FIBRILLATION?:[id number]}  No BP  recorded.  {Refresh Note OR Click here to enter BP  :1}***         Physical Exam:    VS:  LMP  (LMP Unknown)     Wt Readings from Last 3 Encounters:  02/22/24 297 lb (134.7 kg)  02/17/24 296 lb 9.6 oz (134.5 kg)  11/16/23 293 lb 12.8 oz (133.3 kg)     GEN: *** Well nourished, well developed in no acute distress HEENT: Normal NECK: No JVD; No carotid bruits LYMPHATICS: No lymphadenopathy CARDIAC: ***RRR, no murmurs, rubs, gallops RESPIRATORY:  Clear to auscultation without rales, wheezing or rhonchi  ABDOMEN: Soft, non-tender, non-distended MUSCULOSKELETAL:  No edema;  No deformity  SKIN: Warm and dry NEUROLOGIC:  Alert and oriented x 3 PSYCHIATRIC:  Normal affect   ASSESSMENT:    No diagnosis found. PLAN:    In order of problems listed above:  ***      {Are you ordering a CV Procedure (e.g. stress test, cath, DCCV, TEE, etc)?   Press F2        :[id number]}    Medication Adjustments/Labs and Tests Ordered: Current medicines are reviewed at length with the patient today.  Concerns regarding medicines are outlined above.  No orders of the defined types were placed in this encounter.  No orders of the defined types were placed in this encounter.   There are no Patient Instructions on file for this visit.   Signed, Miriam FORBES Shams, NP  05/04/2024 9:20 PM    [facility]

## 2024-05-06 ENCOUNTER — Ambulatory Visit (HOSPITAL_BASED_OUTPATIENT_CLINIC_OR_DEPARTMENT_OTHER): Admitting: Emergency Medicine

## 2024-05-09 ENCOUNTER — Other Ambulatory Visit: Payer: Self-pay

## 2024-05-11 NOTE — Patient Outreach (Signed)
 Complex Care Management   Visit Note  05/11/2024  Name:  [name] [name] MRN: [id number] DOB: 1951-02-26  Situation: Referral received for Complex Care Management related to COPD and DM2 I obtained verbal consent from Patient.  Visit completed with Patient  on the phone  Background:   Past Medical History:  Diagnosis Date   Bronchitis, asthmatic    Coronary artery disease    BMS to LAD 02/2008   Diabetes mellitus without complication (HCC)    Hyperlipidemia    Hypertensive heart disease 04/16/2016   Hypothyroidism    MI, old    Morbid obesity (HCC) 04/16/2016   S/P coronary artery stent placement 04/16/2016   BMS to LAD 2009   Tobacco abuse 04/16/2016    Assessment: Patient Reported Symptoms:  Cognitive Cognitive Status: Alert and oriented to person, place, and time, Insightful and able to interpret abstract concepts, Normal speech and language skills Cognitive/Intellectual Conditions Management [RPT]: None reported or documented in medical history or problem list   Health Maintenance Behaviors: Annual physical exam Healing Pattern: Unsure Health Facilitated by: Healthy diet  Neurological Neurological Review of Symptoms: No symptoms reported    HEENT HEENT Symptoms Reported: No symptoms reported      Cardiovascular Cardiovascular Symptoms Reported: No symptoms reported Does patient have uncontrolled Hypertension?: No Cardiovascular Management Strategies: Medication therapy, Coping strategies, Diet modification, Adequate rest, Activity Cardiovascular Self-Management Outcome: 4 (good)  Respiratory Respiratory Symptoms Reported: Shortness of breath Other Respiratory Symptoms: Has O2 tank for prn use both up[place] and downstairs of her home, also utiilizes nebs when dyspneic. Recently utilized O2 for 2 days d/t deciding not to take her diuretic because of increased incidence of leg cramps Additional Respiratory Details: utilizes )2 at 2L as needed during exacerbations Respiratory  Management Strategies: Oxygen  therapy, Medication therapy, Coping strategies Respiratory Self-Management Outcome: 3 (uncertain)  Endocrine Endocrine Symptoms Reported: No symptoms reported Is patient diabetic?: Yes Is patient checking blood sugars at home?: Yes List most recent blood sugar readings, include date and time of day: fastings running 120s, wants to bring A1c below current result of 8.1 Endocrine Self-Management Outcome: 3 (uncertain) Endocrine Comment: Appt with Dr. Dartha changed from 8/6 to 06/02/24  Gastrointestinal Gastrointestinal Symptoms Reported: No symptoms reported      Genitourinary Genitourinary Symptoms Reported: No symptoms reported    Integumentary Integumentary Symptoms Reported: No symptoms reported    Musculoskeletal Musculoskelatal Symptoms Reviewed: Difficulty walking, Unsteady gait, Weakness Other Musculoskeletal Symptoms: uses a cane for better balance Musculoskeletal Management Strategies: Coping strategies, Medication therapy, Routine screening, Diet modification, Adequate rest Musculoskeletal Self-Management Outcome: 4 (good) Falls in the past year?: No    Psychosocial Psychosocial Symptoms Reported: No symptoms reported     Quality of Family Relationships: supportive, helpful, involved Do you feel physically threatened by others?: No    05/11/2024    PHQ2-9 Depression Screening   Little interest or pleasure in doing things    Feeling down, depressed, or hopeless    PHQ-2 - Total Score    Trouble falling or staying asleep, or sleeping too much    Feeling tired or having little energy    Poor appetite or overeating     Feeling bad about yourself - or that you are a failure or have let yourself or your family down    Trouble concentrating on things, such as reading the newspaper or watching television    Moving or speaking so slowly that other people could have noticed.  Or the opposite - being  so fidgety or restless that you have been moving  around a lot more than usual    Thoughts that you would be better off dead, or hurting yourself in some way    PHQ2-9 Total Score    If you checked off any problems, how difficult have these problems made it for you to do your work, take care of things at home, or get along with other people    Depression Interventions/Treatment      There were no vitals filed for this visit.  Medications Reviewed Today     Reviewed by Gordy Channing LABOR, RN (Registered Nurse) on 05/09/24 at 1436  Med List Status: <None>   Medication Order Taking? Sig Documenting Provider Last Dose Status Informant  Accu-Chek FastClix Lancets MISC [id number] Yes USE TO TEST UP TO FOUR TIMES DAILY AS DIRECTED Waddell Rake, MD  Active   ACCU-CHEK GUIDE test strip [id number] Yes USE AS DIRECTED Allwardt, Alyssa M, PA-C  Active   albuterol  (ACCUNEB ) 1.25 MG/3ML nebulizer solution [id number] Yes TAKE 3 MLS BY NEBULIZATION THREE TIMES DAILY AS NEEDED FOR WHEEZING Allwardt, Alyssa M, PA-C  Active   albuterol  (VENTOLIN  HFA) 108 (90 Base) MCG/ACT inhaler [id number] Yes INHALE 2 PUFFS INTO THE LUNGS EVERY 6 HOURS AS NEEDED FOR WHEEZING OR SHORTNESS OF BREATH Allwardt, Alyssa M, PA-C  Active   ALPRAZolam  (XANAX ) 0.5 MG tablet [id number] Yes TAKE 1 TABLET(0.5 MG) BY MOUTH TWICE DAILY AS NEEDED FOR ANXIETY Allwardt, Alyssa M, PA-C  Active   aspirin  81 MG tablet [id number] Yes Take 81 mg by mouth daily. [provider]  Active Self  atorvastatin  (LIPITOR) 10 MG tablet [id number] Yes TAKE 1 TABLET(10 MG) BY MOUTH DAILY Raford Riggs, MD  Active   azithromycin  (ZITHROMAX  Z-PAK) 250 MG tablet [id number]  Take two tablets on day one, followed by one tablet daily for the next four days.  Patient not taking: Reported on 05/09/2024   Allwardt, Mardy HERO, PA-C  Active   blood glucose meter kit and supplies KIT [id number] Yes Dispense based on patient and insurance preference. Use up to four times daily as directed. DX E11.8 Allwardt, Mardy HERO,  PA-C  Active   Budeson-Glycopyrrol-Formoterol  (BREZTRI  AEROSPHERE) 160-9-4.8 MCG/ACT AERO [id number] Yes Inhale 2 puffs into the lungs 2 (two) times daily. Allwardt, Alyssa M, PA-C  Active   Calcium  Carb-Cholecalciferol (CALCIUM  500 + D3 PO) [id number] Yes Take by mouth. [provider]  Active   fluconazole  (DIFLUCAN ) 150 MG tablet [id number]  Take 1 tab 72 hours after first if still symptomatic  Patient not taking: Reported on 05/09/2024   Allwardt, Mardy HERO, PA-C  Active   furosemide  (LASIX ) 20 MG tablet [id number] Yes TAKE AS DIRECTED THREE TIMES WEEK Allwardt, Alyssa M, PA-C  Active   glipiZIDE  (GLUCOTROL  XL) 10 MG 24 hr tablet [id number] Yes Take 1 tablet (10 mg total) by mouth daily with breakfast. Allwardt, Mardy HERO, PA-C  Active   INVOKANA  100 MG TABS tablet [id number] Yes Take 100 mg by mouth every morning. [provider]  Active            Med Note JUSTINO, TAMMY B   Thu Oct 15, 2023  7:18 AM) Approved for [employer] medication assistance program thru 09/21/2024  ipratropium (ATROVENT ) 0.06 % nasal spray [id number] Yes USE 2 SPRAYS IN EACH NOSTRIL FOUR TIMES DAILY AS DIRECTED Allwardt, Alyssa M, PA-C  Active   ipratropium-albuterol  (DUONEB) 0.5-2.5 (3) MG/3ML SOLN [id number] Yes Take 3 mLs by nebulization every  6 (six) hours as needed. Allwardt, Alyssa M, PA-C  Active   isosorbide  mononitrate (IMDUR ) 60 MG 24 hr tablet [id number] Yes TAKE 1 TABLET(60 MG) BY MOUTH DAILY Raford Riggs, MD  Active   levothyroxine  (SYNTHROID ) 125 MCG tablet [id number] Yes Take 1 tablet (125 mcg total) by mouth daily before breakfast. Allwardt, Alyssa M, PA-C  Active   meloxicam  (MOBIC ) 7.5 MG tablet [id number] Yes Take 1 tablet (7.5 mg total) by mouth daily. Allwardt, Mardy HERO, PA-C  Active   Menthol, Topical Analgesic, (BIOFREEZE) 4 % GEL [id number] Yes Apply topically. [provider]  Active   Misc. Devices (PULSE OXIMETER FOR FINGER) MISC [id number] Yes Use as needed to check pulse  oximetry. Allwardt, Alyssa M, PA-C  Active   Olopatadine  HCl 0.2 % SOLN [id number] Yes Apply 1 drop to eye 2 (two) times daily. Allwardt, Mardy HERO, PA-C  Active   pantoprazole  (PROTONIX ) 40 MG tablet [id number] Yes TAKE 1 TABLET(40 MG) BY MOUTH DAILY Allwardt, Alyssa M, PA-C  Active   vitamin B-12 (CYANOCOBALAMIN ) 1000 MCG tablet [id number] Yes Take 1,000 mcg by mouth daily. [provider]  Active             Recommendation:   Continue Current Plan of Care  Follow Up Plan:   Telephone follow up appointment date/time:  06/07/24 at 2pm with RN Care Management Channing LITTIE Channing A. Gordy RN, BA, [facility], CRRN [place]  [facility] Population Health RN Care Manager Direct Dial: [phone]  Fax: [phone]

## 2024-05-11 NOTE — Patient Instructions (Signed)
 Visit Information  Thank you for taking time to visit with me today. Please don't hesitate to contact me if I can be of assistance to you before our next scheduled telephone appointment.  Our next appointment is by telephone on 06/07/24 at 2pm.  Following is a copy of your care plan:   Goals Addressed             This Visit's Progress    VBCI RN Care Plan       Problems:  Chronic Disease Management support and education needs related to COPD and DMII  Goal: Over the next 3 months the Patient will continue to work with RN Care Manager and/or Social Worker to address care management and care coordination needs related to COPD and DMII as evidenced by adherence to care management team scheduled appointments      Interventions:   COPD Interventions: Advised patient to track and manage COPD triggers Provided instruction about proper use of medications used for management of COPD including inhalers Advised patient to self assesses COPD action plan zone and make appointment with provider if in the yellow zone for 48 hours without improvement Advised patient to engage in light exercise as tolerated 3-5 days a week to aid in the the management of COPD Provided education about and advised patient to utilize infection prevention strategies to reduce risk of respiratory infection Discussed the importance of adequate rest and management of fatigue with COPD Screening for signs and symptoms of depression related to chronic disease state  Assessed social determinant of health barriers   Diabetes Interventions: Assessed patient's understanding of A1c goal: <7% Provided education to patient about basic DM disease process Reviewed medications with patient and discussed importance of medication adherence Counseled on importance of regular laboratory monitoring as prescribed Discussed plans with patient for ongoing care management follow up and provided patient with direct contact information for  care management team Advised patient, providing education and rationale, to check cbg at least twice daily and record, calling PCP for findings outside established parameters Review of patient status, including review of consultants reports, relevant laboratory and other test results, and medications completed Lab Results  Component Value Date   HGBA1C 8.1 (A) 09/21/2023  02/17/2024 A1C = 8.1 (no change from 09/21/23.) PCP referred patient to Endocrinologist, Dr. Obadiah Birmingham for specialist consultation re patient's DM2 management. Pt has appt for 04/27/2024 @ 1pm.  Patient Self-Care Activities:  Attend all scheduled provider appointments Attend church or other social activities Call pharmacy for medication refills 3-7 days in advance of running out of medications Call provider office for new concerns or questions  Perform all self care activities independently  Take medications as prescribed   Work with the pharmacist to address medication management needs and will continue to work with the clinical team to address health care and disease management related needs schedule appointment with eye doctor check feet daily for cuts, sores or redness take the blood sugar log to all doctor visits drink 6 to 8 glasses of water each day eat fish at least once per week fill half of plate with vegetables identify and avoid work-related triggers identify and remove indoor air pollutants limit outdoor activity during cold weather listen for public air quality announcements every day begin a symptom diary develop a rescue plan eliminate symptom triggers at home follow rescue plan if symptoms flare-up use an extra pillow to sleep use devices that will help like a cane, sock-puller or reacher  Plan:  The patient  has been provided with contact information for the care management team and has been advised to call with any health related questions or concerns. Next appointment with RNCM Channing is 9/16 @  2pm             Patient verbalizes understanding of instructions and care plan provided today and agrees to view in MyChart. Active MyChart status and patient understanding of how to access instructions and care plan via MyChart confirmed with patient.     The patient has been provided with contact information for the care management team and has been advised to call with any health related questions or concerns.   Please call the care guide team at [phone] if you need to cancel or reschedule your appointment.   Please call 1-800-273-TALK (toll free, 24 hour hotline) if you are experiencing a Mental Health or Behavioral Health Crisis or need someone to talk to.   Izell Labat A. Gordy RN, BA, [facility], CRRN [place]  [facility] Population Health RN Care Manager Direct Dial: [phone]  Fax: [phone]

## 2024-05-15 ENCOUNTER — Other Ambulatory Visit: Payer: Self-pay | Admitting: Physician Assistant

## 2024-05-16 ENCOUNTER — Ambulatory Visit: Payer: Self-pay

## 2024-05-16 NOTE — Telephone Encounter (Signed)
 Scheduled to see Job

## 2024-05-16 NOTE — Telephone Encounter (Signed)
 FYI Only or Action Required?: FYI only for provider.  Patient was last seen in primary care on 11/10/2023 by Job Lukes, PA.  Called Nurse Triage reporting Cough.  Symptoms began several days ago.  Interventions attempted: OTC medications: Robitussin.  Symptoms are: gradually worsening.  Triage Disposition: See Physician Within 24 Hours  Patient/caregiver understands and will follow disposition?: Yes Copied from CRM [id number]. Topic: Clinical - Red Word Triage >> May 16, 2024  8:15 AM Essie A wrote: Red Word that prompted transfer to Nurse Triage: Symptoms since Saturday: coughing, shortness of breath, congested, is on oxygen  but using more than usual, no fever Reason for Disposition  [1] Known COPD or other severe lung disease (i.e., bronchiectasis, cystic fibrosis, lung surgery) AND [2] symptoms getting worse (i.e., increased sputum purulence or amount, increased breathing difficulty  Answer Assessment - Initial Assessment Questions 1. ONSET: When did the cough begin?      Started on Saturday, 2 days ago  2. SEVERITY: How bad is the cough today?      Getting worse  3. SPUTUM: Describe the color of your sputum (e.g., none, dry cough; clear, white, yellow, green)     Clear  4. HEMOPTYSIS: Are you coughing up any blood? If Yes, ask: How much? (e.g., flecks, streaks, tablespoons, etc.)     *No Answer* 5. DIFFICULTY BREATHING: Are you having difficulty breathing? If Yes, ask: How bad is it? (e.g., mild, moderate, severe)      Mild- only gets short of breath when walking  6. FEVER: Do you have a fever? If Yes, ask: What is your temperature, how was it measured, and when did it start?     No  7. CARDIAC HISTORY: Do you have any history of heart disease? (e.g., heart attack, congestive heart failure)      No  8. LUNG HISTORY: Do you have any history of lung disease?  (e.g., pulmonary embolus, asthma, emphysema)     COPD- having to use oxygen  more  9.  PE RISK FACTORS: Do you have a history of blood clots? (or: recent major surgery, recent prolonged travel, bedridden)     No  10. OTHER SYMPTOMS: Do you have any other symptoms? (e.g., runny nose, wheezing, chest pain)       Sinuses are clogged and sinus head  11. PREGNANCY: Is there any chance you are pregnant? When was your last menstrual period?       *No Answer* 12. TRAVEL: Have you traveled out of the country in the last month? (e.g., travel history, exposures)       Husband and daughter are sick  Protocols used: Cough - Acute Productive-A-AH

## 2024-05-17 ENCOUNTER — Ambulatory Visit: Admitting: Physician Assistant

## 2024-05-18 ENCOUNTER — Other Ambulatory Visit: Payer: Self-pay | Admitting: Physician Assistant

## 2024-05-18 ENCOUNTER — Ambulatory Visit (INDEPENDENT_AMBULATORY_CARE_PROVIDER_SITE_OTHER): Admitting: Physician Assistant

## 2024-05-18 ENCOUNTER — Encounter: Payer: Self-pay | Admitting: Physician Assistant

## 2024-05-18 VITALS — BP 124/60 | HR 79 | Temp 98.6°F | Ht 65.0 in | Wt 296.4 lb

## 2024-05-18 DIAGNOSIS — E1165 Type 2 diabetes mellitus with hyperglycemia: Secondary | ICD-10-CM

## 2024-05-18 DIAGNOSIS — J449 Chronic obstructive pulmonary disease, unspecified: Secondary | ICD-10-CM | POA: Diagnosis not present

## 2024-05-18 DIAGNOSIS — J4541 Moderate persistent asthma with (acute) exacerbation: Secondary | ICD-10-CM

## 2024-05-18 DIAGNOSIS — R051 Acute cough: Secondary | ICD-10-CM | POA: Diagnosis not present

## 2024-05-18 MED ORDER — PREDNISONE 20 MG PO TABS: 20.0000 mg | ORAL_TABLET | Freq: Every day | ORAL | 0 refills | Status: DC

## 2024-05-18 MED ORDER — AZITHROMYCIN 250 MG PO TABS: ORAL_TABLET | ORAL | 0 refills | Status: AC

## 2024-05-18 NOTE — Progress Notes (Signed)
 [name] [name] is a 73 y.o. female here for a recurrence of a previously resolved problem.  History of Present Illness:   Chief Complaint  Patient presents with   Cough    Pt c/o cough started on Saturday, coughing and expectorating yellow sputum. Having SOB with coughing, sinus headache.    Discussed the use of AI scribe software for clinical note transcription with the patient, who gave verbal consent to proceed.  History of Present Illness [name] [name] is a 73 year old female with chronic respiratory issues who presents with worsening breathing difficulties.  She experiences 'gasping for breath' and has increased nebulizer use to three times daily, which provides symptom relief. On Sunday and Monday, she required continuous oxygen due to significant dyspnea. She missed a previous appointment because she felt unsafe to drive.  She has not smoked for five days. Her current medications include Breztri twice daily with occasional additional use, albuterol as a rescue inhaler, and a generic form of Mucinex, half a pill every couple of days due to bowel issues.  Her diabetes management includes a recent A1c of 8, though she believes it may be lower now due to decreased appetite and food intake over the past five days.    Past Medical History:  Diagnosis Date   Bronchitis, asthmatic    Coronary artery disease    BMS to LAD 02/2008   Diabetes mellitus without complication (HCC)    Hyperlipidemia    Hypertensive heart disease 04/16/2016   Hypothyroidism    MI, old    Morbid obesity (HCC) 04/16/2016   S/P coronary artery stent placement 04/16/2016   BMS to LAD 2009   Tobacco abuse 04/16/2016     Social History   Tobacco Use   Smoking status: Every Day    Current packs/day: 0.25    Average packs/day: 0.3 packs/day for 31.0 years (7.8 ttl pk-yrs)    Types: Cigarettes   Smokeless tobacco: Never   Tobacco comments:    1 PPD  Vaping Use   Vaping status: Never Used  Substance Use Topics    Alcohol use: No   Drug use: No    Past Surgical History:  Procedure Laterality Date   ABDOMINAL HYSTERECTOMY     35 ; very heavy bleeding   ANKLE SURGERY Right    CHOLECYSTECTOMY     CORONARY ANGIOPLASTY  02/2008   bare metal stent to LAD    CORONARY STENT PLACEMENT     KNEE ARTHROSCOPY     OOPHORECTOMY Left    age 30   ROTATOR CUFF REPAIR Bilateral    THORACIC OUTLET SURGERY     WRIST SURGERY      Family History  Problem Relation Age of Onset   Cancer Mother    Diabetes Son    Hypertension Son     Allergies  Allergen Reactions   Clindamycin/Lincomycin Other (See Comments)    Head ache    Codeine  Hives   Cortisone    Dilaudid [Hydromorphone Hcl] Nausea And Vomiting   Iodides    Reglan [Metoclopramide] Other (See Comments)    Other reaction(s): Dystonia Hallucinations/Violent   Tramadol    Trulicity  [Dulaglutide ] Other (See Comments)    Severe constipation   Tape Rash    Blisters     Current Medications:   Current Outpatient Medications:    Accu-Chek FastClix Lancets MISC, USE TO TEST UP TO FOUR TIMES DAILY AS DIRECTED, Disp: 306 each, Rfl: 2   ACCU-CHEK GUIDE test strip,  USE AS DIRECTED, Disp: 200 strip, Rfl: 3   albuterol  (ACCUNEB ) 1.25 MG/3ML nebulizer solution, TAKE 3 MLS BY NEBULIZATION THREE TIMES DAILY AS NEEDED FOR WHEEZING, Disp: 75 mL, Rfl: 2   albuterol  (VENTOLIN  HFA) 108 (90 Base) MCG/ACT inhaler, INHALE 2 PUFFS INTO THE LUNGS EVERY 6 HOURS AS NEEDED FOR WHEEZING OR SHORTNESS OF BREATH, Disp: 6.7 g, Rfl: 1   ALPRAZolam  (XANAX ) 0.5 MG tablet, TAKE 1 TABLET(0.5 MG) BY MOUTH TWICE DAILY AS NEEDED FOR ANXIETY, Disp: 60 tablet, Rfl: 2   aspirin  81 MG tablet, Take 81 mg by mouth daily., Disp: , Rfl:    atorvastatin  (LIPITOR) 10 MG tablet, TAKE 1 TABLET(10 MG) BY MOUTH DAILY, Disp: 90 tablet, Rfl: 3   blood glucose meter kit and supplies KIT, Dispense based on patient and insurance preference. Use up to four times daily as directed. DX E11.8, Disp: 1  each, Rfl: 0   Budeson-Glycopyrrol-Formoterol  (BREZTRI  AEROSPHERE) 160-9-4.8 MCG/ACT AERO, Inhale 2 puffs into the lungs 2 (two) times daily., Disp: 32.1 g, Rfl: 3   Calcium  Carb-Cholecalciferol (CALCIUM  500 + D3 PO), Take by mouth., Disp: , Rfl:    furosemide  (LASIX ) 20 MG tablet, TAKE AS DIRECTED THREE TIMES WEEK (Patient taking differently: Take 20 mg by mouth once a week. TAKE AS DIRECTED THREE TIMES WEEK), Disp: 90 tablet, Rfl: 0   glipiZIDE  (GLUCOTROL  XL) 10 MG 24 hr tablet, Take 1 tablet (10 mg total) by mouth daily with breakfast., Disp: 90 tablet, Rfl: 1   INVOKANA  100 MG TABS tablet, Take 100 mg by mouth every morning., Disp: , Rfl:    ipratropium (ATROVENT ) 0.06 % nasal spray, USE 2 SPRAYS IN EACH NOSTRIL FOUR TIMES DAILY AS DIRECTED, Disp: 45 mL, Rfl: 1   ipratropium-albuterol  (DUONEB) 0.5-2.5 (3) MG/3ML SOLN, Take 3 mLs by nebulization every 6 (six) hours as needed., Disp: 360 mL, Rfl: 2   isosorbide  mononitrate (IMDUR ) 60 MG 24 hr tablet, TAKE 1 TABLET(60 MG) BY MOUTH DAILY, Disp: 90 tablet, Rfl: 0   levothyroxine  (SYNTHROID ) 125 MCG tablet, TAKE 1 TABLET(125 MCG) BY MOUTH DAILY BEFORE BREAKFAST, Disp: 90 tablet, Rfl: 0   meloxicam  (MOBIC ) 7.5 MG tablet, Take 1 tablet (7.5 mg total) by mouth daily., Disp: 90 tablet, Rfl: 1   Menthol, Topical Analgesic, (BIOFREEZE) 4 % GEL, Apply topically., Disp: , Rfl:    Misc. Devices (PULSE OXIMETER FOR FINGER) MISC, Use as needed to check pulse oximetry., Disp: 1 each, Rfl: 0   pantoprazole  (PROTONIX ) 40 MG tablet, TAKE 1 TABLET(40 MG) BY MOUTH DAILY, Disp: 90 tablet, Rfl: 1   vitamin B-12 (CYANOCOBALAMIN ) 1000 MCG tablet, Take 1,000 mcg by mouth daily., Disp: , Rfl:    Review of Systems:   Negative unless otherwise specified per HPI.  Vitals:   Vitals:   05/18/24 1409  BP: 124/60  Pulse: 79  Temp: 98.6 F (37 C)  TempSrc: Temporal  SpO2: 90%  Weight: 296 lb 6.1 oz (134.4 kg)  Height: 5' 5 (1.651 m)     Body mass index is 49.32  kg/m.  Physical Exam:   Physical Exam Vitals and nursing note reviewed.  Constitutional:      General: She is not in acute distress.    Appearance: She is well-developed. She is not ill-appearing or toxic-appearing.     Interventions: Nasal cannula in place.  Cardiovascular:     Rate and Rhythm: Normal rate and regular rhythm.     Pulses: Normal pulses.     Heart sounds: Normal  heart sounds, S1 normal and S2 normal.  Pulmonary:     Effort: Pulmonary effort is normal.     Breath sounds: Wheezing present.  Skin:    General: Skin is warm and dry.  Neurological:     Mental Status: She is alert.     GCS: GCS eye subscore is 4. GCS verbal subscore is 5. GCS motor subscore is 6.  Psychiatric:        Speech: Speech normal.        Behavior: Behavior normal. Behavior is cooperative.     Assessment and Plan:   Assessment and Plan Assessment & Plan Chronic obstructive pulmonary disease (COPD) exacerbation COPD exacerbation with increased wheezing and dyspnea. Increased nebulizer use. Recent smoking cessation. Concerns about pulmonologist's feedback. No recent pulmonologist follow-up. Uses Breztri  Aerosphere and albuterol . Mucinex  used intermittently. - Prescribe prednisone  20 mg daily. - Prescribe azithromycin  - Continue Breztri  Aerosphere inhalation twice daily. - Continue albuterol  as needed. - recommend close follow up with pulmonary   Type 2 diabetes mellitus Type 2 diabetes mellitus with A1c of 8. Blood glucose at 140. Concerns about hyperglycemia with steroid use. Reduced appetite and food intake. History of hyperglycemia with steroids. - Encouraged her to stop oral prednisone  if symptom(s) have improved and/or blood sugars are increasing > 200 consistently     Lucie Buttner, PA-C

## 2024-05-19 ENCOUNTER — Ambulatory Visit: Admitting: Physician Assistant

## 2024-05-24 ENCOUNTER — Ambulatory Visit: Admitting: Physician Assistant

## 2024-05-24 ENCOUNTER — Encounter: Payer: Self-pay | Admitting: Physician Assistant

## 2024-05-24 VITALS — BP 136/84 | HR 83 | Temp 97.7°F | Ht 65.0 in | Wt 292.2 lb

## 2024-05-24 DIAGNOSIS — E039 Hypothyroidism, unspecified: Secondary | ICD-10-CM | POA: Insufficient documentation

## 2024-05-24 DIAGNOSIS — F17210 Nicotine dependence, cigarettes, uncomplicated: Secondary | ICD-10-CM | POA: Diagnosis not present

## 2024-05-24 DIAGNOSIS — N183 Chronic kidney disease, stage 3 unspecified: Secondary | ICD-10-CM | POA: Diagnosis not present

## 2024-05-24 DIAGNOSIS — E1165 Type 2 diabetes mellitus with hyperglycemia: Secondary | ICD-10-CM | POA: Diagnosis not present

## 2024-05-24 DIAGNOSIS — J449 Chronic obstructive pulmonary disease, unspecified: Secondary | ICD-10-CM

## 2024-05-24 DIAGNOSIS — F419 Anxiety disorder, unspecified: Secondary | ICD-10-CM | POA: Diagnosis not present

## 2024-05-24 DIAGNOSIS — F439 Reaction to severe stress, unspecified: Secondary | ICD-10-CM

## 2024-05-24 DIAGNOSIS — E7800 Pure hypercholesterolemia, unspecified: Secondary | ICD-10-CM | POA: Insufficient documentation

## 2024-05-24 LAB — POCT GLYCOSYLATED HEMOGLOBIN (HGB A1C): Hemoglobin A1C: 7.9 % — AB (ref 4.0–5.6)

## 2024-05-24 MED ORDER — ALPRAZOLAM 0.5 MG PO TABS: ORAL_TABLET | ORAL | 2 refills | Status: DC

## 2024-05-24 NOTE — Progress Notes (Signed)
 Patient ID: [name] [name], female    DOB: 08-Aug-1951, 73 y.o.   MRN: [id number]   Assessment & Plan:  Type 2 diabetes mellitus with hyperglycemia, without long-term current use of insulin  (HCC) -     POCT glycosylated hemoglobin (Hb A1C)  Anxiety, uses prn Xanax  -     ALPRAZolam ; TAKE 1 TABLET(0.5 MG) BY MOUTH TWICE DAILY AS NEEDED FOR ANXIETY  Dispense: 60 tablet; Refill: 2  Stress at home  COPD GOLD 0/ prob AB still smoking   Cigarette nicotine  dependence without complication  Stage 3 chronic kidney disease, unspecified whether stage 3a or 3b CKD (HCC)      Assessment & Plan Obstructive lung disease, recent exacerbation Recent exacerbation treated with prednisone  and azithromycin . Lungs are clear, but she reports thick mucus causing discomfort. No wheezing. Smoking cessation for two weeks is beneficial. - Continue Breztri  and albuterol  - Follow up with pulmonology - Encourage continued smoking cessation  Type 2 diabetes mellitus with hyperglycemia A1c improved to 7.9% from 8.1%. Managed with lifestyle modifications and medications: Lipitor, glipizide , and Invokana . Intolerant to Trulicity  and Ozempic due to severe constipation. Metformin  stopped due to renal concerns. Insulin  therapy discussed but declined. - Continue current diabetes medication regimen - Monitor blood glucose levels at home - Reassess diabetes management in three months  Anxiety disorder Managed with Xanax  0.5 mg as needed. Increased stress due to husband's dementia and medication refill issues. Xanax  prescription sent. Access to informal counseling from a psychiatrist neighbor. - Continue Xanax  0.5 mg as needed - Ensure Xanax  prescription is filled - Encourage use of informal counseling support  Nicotine  dependence, in early remission Successfully quit smoking for two weeks. Husband supports by not smoking around her. - Encourage continued smoking cessation - Provide support for maintaining nicotine   abstinence  Chronic kidney disease, unspecified stage Monitoring of kidney function necessary. Urine sample not obtained this visit. - Obtain urine sample at next visit to monitor kidney function      Return in about 3 months (around 08/23/2024) for recheck/follow-up.    Subjective:    Chief Complaint  Patient presents with   Diabetes    Pt in office for diabetes f/u and A1C check; pt on recent round of steroids and advised glucose over 300    HPI Discussed the use of AI scribe software for clinical note transcription with the patient, who gave verbal consent to proceed.  History of Present Illness [name] [name] is a 73 year old female with obstructive lung disease and diabetes who presents for a regular follow-up visit.  She feels better compared to her last visit but describes her overall condition as poor. She is experiencing increased stress due to her husband's recent dementia diagnosis, which has been challenging to manage. Her husband had a seizure three years ago, affecting his memory.  She has been out of Xanax  since Friday due to a pharmacy issue, affecting her sleep. She had one dose for Friday night but none for the following days. She typically takes Xanax  0.5 mg as needed, sometimes taking two in a day depending on her stress levels.  She has a history of obstructive lung disease and was recently treated with prednisone  20 mg daily and azithromycin . She continues to use Breztri  and albuterol . She experiences epistaxis due to dry air from her oxygen  use. She has been off smoking for two weeks, quitting cold [place]. She experiences thick mucus that causes coughing and difficulty breathing, especially when she goes downstairs.  Her diabetes management includes an A1c of 7.9%. She uses a glucose meter at home and is on Lipitor 10 mg, glipizide  XL 10 mg daily, and Invokana  100 mg. She has had issues with other diabetes medications in the past, including intolerance to  Trulicity  and Ozempic due to constipation, and metformin  was stopped due to renal function concerns.     Past Medical History:  Diagnosis Date   Bronchitis, asthmatic    Coronary artery disease    BMS to LAD 02/2008   Diabetes mellitus without complication (HCC)    Hyperlipidemia    Hypertensive heart disease 04/16/2016   Hypothyroidism    MI, old    Morbid obesity (HCC) 04/16/2016   S/P coronary artery stent placement 04/16/2016   BMS to LAD 2009   Tobacco abuse 04/16/2016    Past Surgical History:  Procedure Laterality Date   ABDOMINAL HYSTERECTOMY     35; very heavy bleeding   ANKLE SURGERY Right    CHOLECYSTECTOMY     CORONARY ANGIOPLASTY  02/2008   bare metal stent to LAD    CORONARY STENT PLACEMENT     KNEE ARTHROSCOPY     OOPHORECTOMY Left    age 38   ROTATOR CUFF REPAIR Bilateral    THORACIC OUTLET SURGERY     WRIST SURGERY      Family History  Problem Relation Age of Onset   Cancer Mother    Diabetes Son    Hypertension Son     Social History   Tobacco Use   Smoking status: Every Day    Current packs/day: 0.25    Average packs/day: 0.3 packs/day for 31.0 years (7.8 ttl pk-yrs)    Types: Cigarettes   Smokeless tobacco: Never   Tobacco comments:    1 PPD  Vaping Use   Vaping status: Never Used  Substance Use Topics   Alcohol use: No   Drug use: No     Allergies  Allergen Reactions   Clindamycin/Lincomycin Other (See Comments)    Head ache    Codeine  Hives   Cortisone    Dilaudid [Hydromorphone Hcl] Nausea And Vomiting   Iodides    Reglan [Metoclopramide] Other (See Comments)    Other reaction(s): Dystonia Hallucinations/Violent   Tramadol    Trulicity  [Dulaglutide ] Other (See Comments)    Severe constipation   Tape Rash    Blisters     Review of Systems NEGATIVE UNLESS OTHERWISE INDICATED IN HPI      Objective:     BP 136/84 (BP Location: Left Arm, Patient Position: Sitting, Cuff Size: Large)   Pulse 83   Temp 97.7 F (36.5  C) (Temporal)   Ht 5' 5 (1.651 m)   Wt 292 lb 3.2 oz (132.5 kg)   LMP  (LMP Unknown)   SpO2 90%   BMI 48.62 kg/m   Wt Readings from Last 3 Encounters:  05/24/24 292 lb 3.2 oz (132.5 kg)  05/18/24 296 lb 6.1 oz (134.4 kg)  02/22/24 297 lb (134.7 kg)    BP Readings from Last 3 Encounters:  05/24/24 136/84  05/18/24 124/60  02/22/24 110/70     Physical Exam Vitals and nursing note reviewed.  Constitutional:      Appearance: Normal appearance. She is obese.  Eyes:     Extraocular Movements: Extraocular movements intact.     Conjunctiva/sclera: Conjunctivae normal.     Pupils: Pupils are equal, round, and reactive to light.  Cardiovascular:     Rate and Rhythm: Normal  rate and regular rhythm.     Pulses: Normal pulses.     Heart sounds: No murmur heard. Pulmonary:     Breath sounds: No decreased air movement. No decreased breath sounds or wheezing.  Musculoskeletal:     Right shoulder: No tenderness, bony tenderness or crepitus. Normal range of motion. Normal strength.  Skin:    Comments: Bluish-colored nose  Neurological:     General: No focal deficit present.     Mental Status: She is alert and oriented to person, place, and time.  Psychiatric:     Comments: Very tearful today             Azalea Cedar M Joandry Slagter, PA-C

## 2024-05-26 NOTE — Patient Instructions (Signed)
  VISIT SUMMARY: Today, we reviewed your obstructive lung disease, diabetes, anxiety, and recent smoking cessation progress. We also discussed your increased stress due to your husband's health issues and addressed your medication refill concerns.  YOUR PLAN: OBSTRUCTIVE LUNG DISEASE: You recently had a flare-up but are feeling better now. You have thick mucus causing discomfort, but no wheezing. -Continue using Breztri  and albuterol  as prescribed. -Follow up with your lung specialist. -Keep up the good work with not smoking.  TYPE 2 DIABETES MELLITUS WITH HYPERGLYCEMIA: Your blood sugar control has improved slightly. You are managing it with lifestyle changes and medications. -Continue taking Lipitor, glipizide , and Invokana  as prescribed. -Keep monitoring your blood sugar levels at home. -We will reassess your diabetes management in three months.  ANXIETY DISORDER: Your anxiety is being managed with Xanax , but you have been under increased stress recently. -Continue taking Xanax  0.5 mg as needed. -Make sure to get your Xanax  prescription filled. -Consider talking to your psychiatrist neighbor for support.  NICOTINE  DEPENDENCE, IN EARLY REMISSION: You have successfully quit smoking for two weeks. -Keep up the great work with not smoking. -Seek support if you feel tempted to smoke again.  CHRONIC KIDNEY DISEASE, UNSPECIFIED STAGE: We need to keep an eye on your kidney function. -We will get a urine sample at your next visit to check your kidney function.                      Contains text generated by [employer]bridge.                                 Contains text generated by Abridge.

## 2024-05-30 ENCOUNTER — Telehealth: Payer: Self-pay | Admitting: Cardiovascular Disease

## 2024-05-30 MED ORDER — ISOSORBIDE MONONITRATE ER 60 MG PO TB24: 60.0000 mg | ORAL_TABLET | Freq: Every day | ORAL | 0 refills | Status: DC

## 2024-05-30 NOTE — Telephone Encounter (Signed)
 RX sent in

## 2024-05-30 NOTE — Telephone Encounter (Signed)
*  STAT* If patient is at the pharmacy, call can be transferred to refill team.   1. Which medications need to be refilled? (please list name of each medication and dose if known)   isosorbide  mononitrate (IMDUR ) 60 MG 24 hr tablet    2. Which pharmacy/location (including street and city if local pharmacy) is medication to be sent to? [id number] [place], [place] - [phone] AT [facility] OF [address] Phone: [phone]  Fax: [phone]     3. Do they need a 30 day or 90 day supply? 90

## 2024-06-02 ENCOUNTER — Ambulatory Visit: Admitting: "Endocrinology

## 2024-06-03 ENCOUNTER — Other Ambulatory Visit: Payer: Self-pay | Admitting: Physician Assistant

## 2024-06-03 ENCOUNTER — Ambulatory Visit: Payer: Self-pay

## 2024-06-03 MED ORDER — FLUCONAZOLE 150 MG PO TABS: 150.0000 mg | ORAL_TABLET | Freq: Once | ORAL | 0 refills | Status: AC

## 2024-06-03 NOTE — Telephone Encounter (Signed)
 FYI Only or Action Required?: Action required by provider: Medication request.  Patient was last seen in primary care on 05/24/2024 by Allwardt, Alyssa M, PA-C.  Called Nurse Triage reporting Vaginal Itching.  Symptoms began yesterday.  Interventions attempted: Nothing.  Symptoms are: gradually worsening.  Triage Disposition: See Physician Within 24 Hours  Patient/caregiver understands and will follow disposition?: No, wishes to speak with PCP         Copied from CRM #[id number]. Topic: Clinical - Red Word Triage >> Jun 03, 2024  9:42 AM Jasmin G wrote: Red Word that prompted transfer to Nurse Triage: Possible yeast infection, extreme itchiness. Pt initially called to see if antibiotics could be called in for her.          Reason for Disposition  MODERATE-SEVERE itching (i.e., interferes with school, work, or sleep)  Answer Assessment - Initial Assessment Questions Patient declined an appointment, requesting that Alyssa call in a medication for her possible yeast infections. Please advise.      1. SYMPTOM: What's the main symptom you're concerned about? (e.g., pain, itching, dryness)     Itchiness  3. ONSET: When did the itchiness start?     1 day ago  4. PAIN: Is there any pain? If Yes, ask: How bad is it? (Scale: 1-10; mild, moderate, severe)     No 5. ITCHING: Is there any itching? If Yes, ask: How bad is it? (Scale: 1-10; mild, moderate, severe)     Moderate  6. CAUSE: What do you think is causing the discharge? Have you had the same problem before? What happened then?     Possible yeast infection 7. OTHER SYMPTOMS: Do you have any other symptoms? (e.g., fever, itching, vaginal bleeding, pain with urination, injury to genital area, vaginal foreign body)     No  Protocols used: Vaginal Symptoms-A-AH

## 2024-06-03 NOTE — Telephone Encounter (Signed)
 Please see triage note on patient

## 2024-06-07 ENCOUNTER — Other Ambulatory Visit: Payer: Self-pay

## 2024-06-13 NOTE — Progress Notes (Signed)
 [name] [name]                                          MRN: [id number]   06/13/2024   The [facility] Quality Team Specialist reviewed this patient medical record for the purposes of chart review for care gap closure. The following were reviewed: chart review for care gap closure-kidney health evaluation for diabetes:eGFR  and uACR.    [facility] Quality Team

## 2024-06-21 ENCOUNTER — Other Ambulatory Visit: Payer: Self-pay

## 2024-06-21 ENCOUNTER — Telehealth: Payer: Self-pay

## 2024-06-21 NOTE — Telephone Encounter (Signed)
 Pt calling in regards to appt and missed call today at 2pm. Please return patient call

## 2024-06-28 ENCOUNTER — Other Ambulatory Visit: Payer: Self-pay

## 2024-06-30 ENCOUNTER — Encounter: Payer: Self-pay | Admitting: Podiatry

## 2024-06-30 ENCOUNTER — Ambulatory Visit: Admitting: Podiatry

## 2024-06-30 DIAGNOSIS — B351 Tinea unguium: Secondary | ICD-10-CM

## 2024-06-30 DIAGNOSIS — M79675 Pain in left toe(s): Secondary | ICD-10-CM | POA: Diagnosis not present

## 2024-06-30 DIAGNOSIS — M79674 Pain in right toe(s): Secondary | ICD-10-CM | POA: Diagnosis not present

## 2024-06-30 NOTE — Progress Notes (Signed)
 Subjective:   Patient ID: [name] [name], female   DOB: 73 y.o.   MRN: [id number]   HPI Patient presents with significant nail disease 1-5 both feet that are thickened dystrophic and she cannot take care of   ROS      Objective:  Physical Exam  Thick yellow brittle nailbeds 1-5 both feet painful     Assessment:  Chronic mycotic nail infection with pain 1-5 both the     Plan:  Debridement painful nailbeds 1-5 both feet no iatrogenic bleeding

## 2024-07-02 ENCOUNTER — Other Ambulatory Visit: Payer: Self-pay | Admitting: Physician Assistant

## 2024-07-04 NOTE — Telephone Encounter (Signed)
 Mediation showing provider historical, please advise.

## 2024-07-05 ENCOUNTER — Telehealth: Payer: Self-pay

## 2024-07-05 NOTE — Telephone Encounter (Signed)
 PAP: Patient assistance application for Breztri  through [employer] ([employer]) has been mailed to pt's home address on file. Provider portion of application will be faxed to provider's office.

## 2024-07-06 NOTE — Telephone Encounter (Signed)
 Received provider portion of Pap application Breztri  ([employer])

## 2024-07-18 ENCOUNTER — Encounter (HOSPITAL_BASED_OUTPATIENT_CLINIC_OR_DEPARTMENT_OTHER): Payer: Self-pay | Admitting: Family

## 2024-07-18 ENCOUNTER — Ambulatory Visit (HOSPITAL_BASED_OUTPATIENT_CLINIC_OR_DEPARTMENT_OTHER): Admitting: Family

## 2024-07-18 ENCOUNTER — Other Ambulatory Visit: Payer: Self-pay | Admitting: Physician Assistant

## 2024-07-18 VITALS — BP 128/82 | HR 85 | Ht 65.0 in | Wt 293.3 lb

## 2024-07-18 DIAGNOSIS — E785 Hyperlipidemia, unspecified: Secondary | ICD-10-CM | POA: Diagnosis not present

## 2024-07-18 DIAGNOSIS — I25118 Atherosclerotic heart disease of native coronary artery with other forms of angina pectoris: Secondary | ICD-10-CM

## 2024-07-18 DIAGNOSIS — Z720 Tobacco use: Secondary | ICD-10-CM | POA: Diagnosis not present

## 2024-07-18 MED ORDER — ISOSORBIDE MONONITRATE ER 60 MG PO TB24: 60.0000 mg | ORAL_TABLET | Freq: Every day | ORAL | 3 refills | Status: AC

## 2024-07-18 MED ORDER — ATORVASTATIN CALCIUM 10 MG PO TABS: 10.0000 mg | ORAL_TABLET | Freq: Every day | ORAL | 3 refills | Status: AC

## 2024-07-18 NOTE — Patient Instructions (Signed)
 Medication Instructions:  Continue your current medications.   *If you need a refill on your cardiac medications before your next appointment, please call your pharmacy*  Testing/Procedures: Your EKG today was stable from previous which is a good result.  Follow-Up: At [facility], you and your health needs are our priority.  As part of our continuing mission to provide you with exceptional heart care, our providers are all part of one team.  This team includes your primary Cardiologist (physician) and Advanced Practice Providers or APPs (Physician Assistants and Nurse Practitioners) who all work together to provide you with the care you need, when you need it.  Your next appointment:   1 year(s)  Provider:   Annabella Scarce, MD or Reche Finder, NP    We recommend signing up for the patient portal called MyChart.  Sign up information is provided on this After Visit Summary.  MyChart is used to connect with patients for Virtual Visits (Telemedicine).  Patients are able to view lab/test results, encounter notes, upcoming appointments, etc.  Non-urgent messages can be sent to your provider as well.   To learn more about what you can do with MyChart, go to [email or web address].   Other Instructions

## 2024-07-18 NOTE — Progress Notes (Signed)
 Cardiology Office Note   Date:  07/18/2024  ID:  [name] [name], [name] [name] 01/01/1951, MRN [id number] PCP: [name], [name] CHRISTELLA, PA-C  [facility] Providers Cardiologist:  Annabella Scarce, MD     History of Present Illness [name] [name] is a 73 y.o. female with a hx of CAD s/p PCI (02/2008 BMS-LAD in [place]), asthma, morbid obesity, chronic bronchitis, and hypothyroidism.   Prior cardiolite 01/07/13 negative for ischemia, LVEF 79%. Last seen 05/13/21 without angina. She was offered referral to [facility] which she declined.   She was last seen 02/20/2023.  She was without anginal symptoms.  Her BP was at goal less than 130/80.  She was recommended for follow-up in 1 year.   Presents today for follow up with her husband. Rare midsternal chest pain which self resolves. Occasional sharp pain across her left breast.  These are overall similar to previous.  She reports taking her Lasix  only once every other week due to leg cramping if she takes it more routinely.  This is managed by her primary care provider.  Reports persistent difficulties with hyperglycemia with blood sugar upon waking 130 enterally after breakfast and pink to 250s.  Previously did not tolerate GLP-1 Ozempic and Trulicity  with severe constipation.  Has upcoming visit in December to establish with endocrinology.  She notes being more short of breath and decreased exercise tolerance since resuming smoking.  She notes it is difficult to quit smoking as she has other people in her home who smoke.  She has no formal exercise routine and declines referral to physical therapy today.  ROS: Please see the history of present illness.    All other systems reviewed and are negative.   Studies Reviewed EKG Interpretation Date/Time:  Monday July 18 2024 15:39:24 EDT Ventricular Rate:  85 PR Interval:  154 QRS Duration:  88 QT Interval:  372 QTC Calculation: 442 R Axis:   113  Text Interpretation: Normal sinus rhythm  Left posterior fascicular block When compared with ECG of 02-Jan-2023 16:10, PREVIOUS ECG IS PRESENT Confirmed by Vannie Mora ([id number]) on 07/18/2024 4:19:08 PM    Cardiac Studies & Procedures   ______________________________________________________________________________________________   STRESS TESTS  MYOCARDIAL PERFUSION IMAGING 03/02/2019  Interpretation Summary  Nuclear stress EF: 79%. The left ventricular ejection fraction is hyperdynamic (>65%). The apex was not visualized. Suggest echocardiogram for further evaluation  There was no ST segment deviation noted during stress.  Defect 1: There is a small defect of moderate severity present in the apical anterior, apical septal and apex location. This appears to be c/w a previous apical MI with perhaps a small amount of peri-infarct ischemi a  Findings consistent with prior apical myocardial infarction.  This is a low risk study.   ECHOCARDIOGRAM  ECHOCARDIOGRAM COMPLETE 03/11/2019  Narrative ECHOCARDIOGRAM REPORT    Patient Name:   [name] [name]   Date of Exam: 03/11/2019 Medical Rec #:  [id number]     Height:       65.0 in Accession #:    [id number]    Weight:       286.0 lb Date of Birth:  1951/09/03     BSA:          2.30 m Patient Age:    67 years      BP:           136/86 mmHg Patient Gender: F             HR:  86 bpm. Exam Location:  [address]   Procedure: 2D Echo, Cardiac Doppler, Color Doppler and Intracardiac Opacification Agent  Indications:    R07.9 Chest pain I25.10 Coronary artery disease.  History:        Patient has no prior history of Echocardiogram examinations. Risk Factors: Morbid Obesity, Current Smoker, Hypertension and Diabetes. Myocardial infarction. Chest pain. Anxiety. Coronary artery disease. Hypothyroidism.  Sonographer:    Carl Coma RDCS Referring Phys: [id number] CALLIE E GOODRICH  IMPRESSIONS   1. The left ventricle has hyperdynamic systolic function, with  an ejection fraction of >65%. The cavity size was normal. Left ventricular diastolic Doppler parameters are consistent with impaired relaxation. 2. The right ventricle has normal systolic function. The cavity was normal. 3. The mitral valve is grossly normal. There is moderate mitral annular calcification present. 4. The tricuspid valve is grossly normal. 5. The aortic valve was not well visualized. No stenosis of the aortic valve. 6. Technically difficult; definity  used; vigorous LV systolic function; mild diastolic dysfunction.  FINDINGS Left Ventricle: The left ventricle has hyperdynamic systolic function, with an ejection fraction of >65%. The cavity size was normal. There is no increase in left ventricular wall thickness. Left ventricular diastolic Doppler parameters are consistent with impaired relaxation. Definity  contrast agent was given IV to delineate the left ventricular endocardial borders.  Right Ventricle: The right ventricle has normal systolic function. The cavity was normal.  Left Atrium: Left atrial size was normal in size.  Right Atrium: Right atrial size was normal in size.  Interatrial Septum: No atrial level shunt detected by color flow Doppler.  Pericardium: There is no evidence of pericardial effusion.  Mitral Valve: The mitral valve is grossly normal. There is moderate mitral annular calcification present. Mitral valve regurgitation is not visualized by color flow Doppler.  Tricuspid Valve: The tricuspid valve is grossly normal. Tricuspid valve regurgitation was not visualized by color flow Doppler.  Aortic Valve: The aortic valve was not well visualized Aortic valve regurgitation was not visualized by color flow Doppler. There is No stenosis of the aortic valve.  Pulmonic Valve: The pulmonic valve was not well visualized. Pulmonic valve regurgitation is not visualized by color flow Doppler.  Venous: The inferior vena cava was not well visualized.  Additional  Comments: Technically difficult; definity  used; vigorous LV systolic function; mild diastolic dysfunction.   +--------------+--------++ LEFT VENTRICLE         +----------------+---------++ +--------------+--------++ Diastology                PLAX 2D                +----------------+---------++ +--------------+--------++ LV e' lateral:  6.74 cm/s LVIDd:        3.90 cm  +----------------+---------++ +--------------+--------++ LV E/e' lateral:11.4      LVIDs:        2.20 cm  +----------------+---------++ +--------------+--------++ LV e' medial:   6.42 cm/s LV PW:        0.70 cm  +----------------+---------++ +--------------+--------++ LV E/e' medial: 12.0      LV IVS:       1.00 cm  +----------------+---------++ +--------------+--------++ LVOT diam:    1.70 cm  +--------------+--------++ LV SV:        50 ml    +--------------+--------++ LV SV Index:  19.77    +--------------+--------++ LVOT Area:    2.27 cm +--------------+--------++                        +--------------+--------++  +---------------+----------++  RIGHT VENTRICLE           +---------------+----------++ RV Basal diam: 3.90 cm    +---------------+----------++ RV S prime:    14.10 cm/s +---------------+----------++ TAPSE (M-mode):1.8 cm     +---------------+----------++  +---------------+-------++-----------++ LEFT ATRIUM           Index       +---------------+-------++-----------++ LA diam:       4.30 cm1.87 cm/m  +---------------+-------++-----------++ LA Vol (A2C):  32.2 ml13.98 ml/m +---------------+-------++-----------++ LA Vol (A4C):  36.4 ml15.81 ml/m +---------------+-------++-----------++ LA Biplane Vol:36.0 ml15.63 ml/m +---------------+-------++-----------++ +------------+--------++----------++ RIGHT ATRIUM        Index      +------------+--------++----------++ RA Area:     9.71 cm           +------------+--------++----------++ RA Volume:  19.80 ml8.60 ml/m +------------+--------++----------++ +------------+-----------++ AORTIC VALVE            +------------+-----------++ LVOT Vmax:  117.50 cm/s +------------+-----------++ LVOT Vmean: 91.150 cm/s +------------+-----------++ LVOT VTI:   0.262 m     +------------+-----------++  +-------------+-------++ AORTA                +-------------+-------++ Ao Root diam:2.90 cm +-------------+-------++  +--------------+----------++ MITRAL VALVE              +--------------+-------+ +--------------+----------++  SHUNTS                MV Area (PHT):2.80 cm    +--------------+-------+ +--------------+----------++  Systemic VTI: 0.26 m  MV PHT:       78.59 msec  +--------------+-------+ +--------------+----------++  Systemic Diam:1.70 cm MV Decel Time:271 msec    +--------------+-------+ +--------------+----------++ +--------------+-----------++ MV E velocity:77.10 cm/s  +--------------+-----------++ MV A velocity:118.00 cm/s +--------------+-----------++ MV E/A ratio: 0.65        +--------------+-----------++   Redell Shallow MD Electronically signed by Redell Shallow MD Signature Date/Time: 03/11/2019/12:53:54 PM    Final          ______________________________________________________________________________________________      Risk Assessment/Calculations           Physical Exam VS:  BP 128/82   Pulse 85   Ht 5' 5 (1.651 m)   Wt 293 lb 4.8 oz (133 kg)   LMP  (LMP Unknown)   SpO2 92%   BMI 48.81 kg/m   Today's Vitals   07/18/24 1534 07/18/24 1558  BP: 138/68 128/82  Pulse: 85   SpO2: 92%   Weight: 293 lb 4.8 oz (133 kg)   Height: 5' 5 (1.651 m)    Body mass index is 48.81 kg/m.       Wt Readings from Last 3 Encounters:  07/18/24 293 lb 4.8 oz (133 kg)  05/24/24 292 lb 3.2 oz (132.5 kg)   05/18/24 296 lb 6.1 oz (134.4 kg)    GEN: Well nourished, overweight, well developed in no acute distress NECK: No JVD; No carotid bruits CARDIAC: RRR, no murmurs, rubs, gallops RESPIRATORY:  Clear to auscultation without rales, wheezing or rhonchi  ABDOMEN: Soft, non-tender, non-distended EXTREMITIES:  No edema; No deformity   ASSESSMENT AND PLAN  CAD with BMS to LAD 02/2008 - Stable with no anginal symptoms. No indication for ischemic evaluation.  GDMT asirin 81 mg daily, Imdur  60 mg daily, Atorvastatin  10 mg daily. Heart healthy diet and regular cardiovascular exercise encouraged.     HLD, LDL goal <70 -09/21/2023 LDL 46. Continue Atorvastatin .   HTN - BP initially elevated 138/68, improved to 128/82 without intervention. Discussed to monitor BP at home at least 2 hours after medications  and sitting for 5-10 minutes. Continue imdur  60mg  daily. If BP routinely >130/80, intensify antihypertensive regimen.   DM2 - Continue to follow with PCP. Upcoming visit with Dr. Dartha to establish care.    Morbid obesity - Weight loss via diet and exercise encouraged. Discussed the impact being overweight would have on cardiovascular risk.  Declines referral to outpatient PT.  Tobacco use - Smoking cessation encouraged. Recommend utilization of 1800QUITNOW. Notes difficulty quitting smoking as other people in her home smoke.        Dispo: follow up in 1 year  Signed, Reche GORMAN Finder, NP

## 2024-07-18 NOTE — Telephone Encounter (Unsigned)
 Copied from CRM [id number]. Topic: Clinical - Medication Refill >> Jul 18, 2024  2:26 PM Roselie C wrote: Medication: levothyroxine  (SYNTHROID ) 125 MCG tablet meloxicam  (MOBIC ) 7.5 MG table   Has the patient contacted their pharmacy? No (Agent: If no, request that the patient contact the pharmacy for the refill. If patient does not wish to contact the pharmacy document the reason why and proceed with request.) (Agent: If yes, when and what did the pharmacy advise?)  This is the patient's preferred pharmacy:  [id number] [place], [place] - [address] AT [facility] & [address] [place] [address] Phone: [phone] Fax: [phone]   Is this the correct pharmacy for this prescription? Yes If no, delete pharmacy and type the correct one.   Has the prescription been filled recently? Yes  Is the patient out of the medication? No  Has the patient been seen for an appointment in the last year OR does the patient have an upcoming appointment? Yes  Can we respond through MyChart? No  Agent: Please be advised that Rx refills may take up to 3 business days. We ask that you follow-up with your pharmacy.

## 2024-07-18 NOTE — Telephone Encounter (Signed)
 PAP: Application for [name] [name] has been submitted to [employer] ([employer]), via fax

## 2024-07-19 MED ORDER — LEVOTHYROXINE SODIUM 125 MCG PO TABS: 125.0000 ug | ORAL_TABLET | Freq: Every day | ORAL | 0 refills | Status: DC

## 2024-07-19 MED ORDER — MELOXICAM 7.5 MG PO TABS: 7.5000 mg | ORAL_TABLET | Freq: Every day | ORAL | 1 refills | Status: AC

## 2024-07-19 NOTE — Telephone Encounter (Signed)
 PAP: Patient assistance application for Breztri  has been approved by PAP Companies: [employer] from 09/22/2024 to 09/21/2025. Medication should be delivered to PAP Delivery: Home. For further shipping updates, please contact [employer] ([employer]) at [phone]. Patient ID is: [id number]

## 2024-07-25 ENCOUNTER — Telehealth: Payer: Self-pay

## 2024-07-25 ENCOUNTER — Telehealth: Payer: Self-pay | Admitting: *Deleted

## 2024-07-25 ENCOUNTER — Other Ambulatory Visit: Payer: Self-pay | Admitting: Physician Assistant

## 2024-07-25 DIAGNOSIS — J449 Chronic obstructive pulmonary disease, unspecified: Secondary | ICD-10-CM

## 2024-07-25 NOTE — Telephone Encounter (Signed)
 Copied from CRM [id number]. Topic: General - Other >> Jul 25, 2024  4:05 PM Nessti S wrote: Reason for CRM: pt called because she has a paper from jury duty, she can drop it off and would need it filled out. Call back number [phone]; can leave voicemail.

## 2024-07-25 NOTE — Telephone Encounter (Signed)
 Copied from CRM [id number]. Topic: Referral - Request for Referral >> Jul 22, 2024  3:31 PM [name] wrote: Did the patient discuss referral with their provider in the last year? Yes (If No - schedule appointment) (If Yes - send message)  Appointment offered? No   Type of order/referral and detailed reason for visit: Pulmonologist   Preference of office, provider, location: Patient wants to make a switch from where she was sent to initially to the Pulmonology in [place]. Where patient went to initially she is not familiar with. This pulmonologist office in [place] is much closer to patient's home.   If referral order, have you been seen by this specialty before? Yes (If Yes, this issue or another issue? When? Where?   Please advise  [name] [name],RMA  Can we respond through MyChart? Phone

## 2024-07-29 NOTE — Telephone Encounter (Signed)
 Patient is calling in for an update, please advise.

## 2024-08-02 ENCOUNTER — Ambulatory Visit: Admitting: Physician Assistant

## 2024-08-02 ENCOUNTER — Encounter: Payer: Self-pay | Admitting: Physician Assistant

## 2024-08-02 VITALS — BP 120/70 | HR 76 | Temp 97.5°F | Ht 65.0 in | Wt 291.6 lb

## 2024-08-02 DIAGNOSIS — J441 Chronic obstructive pulmonary disease with (acute) exacerbation: Secondary | ICD-10-CM | POA: Diagnosis not present

## 2024-08-02 DIAGNOSIS — Z7984 Long term (current) use of oral hypoglycemic drugs: Secondary | ICD-10-CM | POA: Diagnosis not present

## 2024-08-02 DIAGNOSIS — F17210 Nicotine dependence, cigarettes, uncomplicated: Secondary | ICD-10-CM | POA: Diagnosis not present

## 2024-08-02 DIAGNOSIS — E1165 Type 2 diabetes mellitus with hyperglycemia: Secondary | ICD-10-CM

## 2024-08-02 MED ORDER — PREDNISONE 20 MG PO TABS: 20.0000 mg | ORAL_TABLET | Freq: Every day | ORAL | 0 refills | Status: AC

## 2024-08-02 MED ORDER — AZITHROMYCIN 250 MG PO TABS: ORAL_TABLET | ORAL | 0 refills | Status: AC

## 2024-08-02 NOTE — Progress Notes (Unsigned)
 Patient ID: [name] [name], female    DOB: [birth year], 73 y.o.   MRN: [id number]   Assessment & Plan:  Type 2 diabetes mellitus with hyperglycemia, without long-term current use of insulin  (HCC)      Assessment and Plan Assessment & Plan       No follow-ups on file.    Subjective:    Chief Complaint  Patient presents with   Medical Management of Chronic Issues    Pt in office to discuss jury duty paperwork needed; Having some sinus issues and a cough. Symptoms started last Wednesday.    Cough   Sinus Problem    HPI Discussed the use of AI scribe software for clinical note transcription with the patient, who gave verbal consent to proceed.  History of Present Illness      Past Medical History:  Diagnosis Date   Bronchitis, asthmatic    Coronary artery disease    BMS to LAD 02/2008   Diabetes mellitus without complication (HCC)    Hyperlipidemia    Hypertensive heart disease 04/16/2016   Hypothyroidism    MI, old    Morbid obesity (HCC) 04/16/2016   S/P coronary artery stent placement 04/16/2016   BMS to LAD 2009   Tobacco abuse 04/16/2016    Past Surgical History:  Procedure Laterality Date   ABDOMINAL HYSTERECTOMY     35; very heavy bleeding   ANKLE SURGERY Right    CHOLECYSTECTOMY     CORONARY ANGIOPLASTY  02/2008   bare metal stent to LAD    CORONARY STENT PLACEMENT     KNEE ARTHROSCOPY     OOPHORECTOMY Left    age 64   ROTATOR CUFF REPAIR Bilateral    THORACIC OUTLET SURGERY     WRIST SURGERY      Family History  Problem Relation Age of Onset   Cancer Mother    Diabetes Son    Hypertension Son     Social History   Tobacco Use   Smoking status: Every Day    Current packs/day: 0.25    Average packs/day: 0.3 packs/day for 31.0 years (7.8 ttl pk-yrs)    Types: Cigarettes    Passive exposure: Current   Smokeless tobacco: Never   Tobacco comments:    1 PPD - down to 5-6 cigarettes a day (07/18/24)  Vaping Use   Vaping status: Never  Used  Substance Use Topics   Alcohol use: No   Drug use: No     Allergies  Allergen Reactions   Clindamycin/Lincomycin Other (See Comments)    Head ache    Codeine  Hives   Cortisone    Dilaudid [Hydromorphone Hcl] Nausea And Vomiting   Iodides    Reglan [Metoclopramide] Other (See Comments)    Other reaction(s): Dystonia Hallucinations/Violent   Tramadol    Trulicity  [Dulaglutide ] Other (See Comments)    Severe constipation   Tape Rash    Blisters     Review of Systems NEGATIVE UNLESS OTHERWISE INDICATED IN HPI      Objective:     BP 120/70 (BP Location: Left Arm, Patient Position: Sitting)   Pulse 76   Temp (!) 97.5 F (36.4 C) (Temporal)   Ht 5' 5 (1.651 m)   Wt 291 lb 9.6 oz (132.3 kg)   LMP  (LMP Unknown)   SpO2 96%   BMI 48.52 kg/m   Wt Readings from Last 3 Encounters:  08/02/24 291 lb 9.6 oz (132.3 kg)  07/18/24 293 lb 4.8  oz (133 kg)  05/24/24 292 lb 3.2 oz (132.5 kg)    BP Readings from Last 3 Encounters:  08/02/24 120/70  07/18/24 128/82  05/24/24 136/84     Physical Exam          Romello Hoehn M Freddye Cardamone, PA-C

## 2024-08-22 ENCOUNTER — Ambulatory Visit: Admitting: "Endocrinology

## 2024-08-23 ENCOUNTER — Encounter: Payer: Self-pay | Admitting: Physician Assistant

## 2024-08-23 ENCOUNTER — Ambulatory Visit: Admitting: Physician Assistant

## 2024-08-23 VITALS — BP 122/78 | HR 72 | Temp 97.2°F | Ht 65.0 in | Wt 296.2 lb

## 2024-08-23 DIAGNOSIS — E1169 Type 2 diabetes mellitus with other specified complication: Secondary | ICD-10-CM

## 2024-08-23 DIAGNOSIS — F439 Reaction to severe stress, unspecified: Secondary | ICD-10-CM

## 2024-08-23 DIAGNOSIS — N1832 Chronic kidney disease, stage 3b: Secondary | ICD-10-CM

## 2024-08-23 DIAGNOSIS — E039 Hypothyroidism, unspecified: Secondary | ICD-10-CM

## 2024-08-23 DIAGNOSIS — Z1283 Encounter for screening for malignant neoplasm of skin: Secondary | ICD-10-CM

## 2024-08-23 DIAGNOSIS — E1165 Type 2 diabetes mellitus with hyperglycemia: Secondary | ICD-10-CM

## 2024-08-23 DIAGNOSIS — J4541 Moderate persistent asthma with (acute) exacerbation: Secondary | ICD-10-CM

## 2024-08-23 DIAGNOSIS — Z720 Tobacco use: Secondary | ICD-10-CM

## 2024-08-23 DIAGNOSIS — D751 Secondary polycythemia: Secondary | ICD-10-CM

## 2024-08-23 DIAGNOSIS — Z23 Encounter for immunization: Secondary | ICD-10-CM

## 2024-08-23 LAB — COMPREHENSIVE METABOLIC PANEL WITH GFR
ALT: 22 U/L (ref 0–35)
AST: 25 U/L (ref 0–37)
Albumin: 3.7 g/dL (ref 3.5–5.2)
Alkaline Phosphatase: 90 U/L (ref 39–117)
BUN: 19 mg/dL (ref 6–23)
CO2: 32 meq/L (ref 19–32)
Calcium: 9.6 mg/dL (ref 8.4–10.5)
Chloride: 101 meq/L (ref 96–112)
Creatinine, Ser: 1.21 mg/dL — ABNORMAL HIGH (ref 0.40–1.20)
GFR: 45.19 mL/min — ABNORMAL LOW (ref >60.00)
Glucose, Bld: 266 mg/dL — ABNORMAL HIGH (ref 70–99)
Potassium: 4.7 meq/L (ref 3.5–5.1)
Sodium: 139 meq/L (ref 135–145)
Total Bilirubin: 1 mg/dL (ref 0.2–1.2)
Total Protein: 6.5 g/dL (ref 6.0–8.3)

## 2024-08-23 LAB — LIPID PANEL
Cholesterol: 116 mg/dL (ref 0–200)
HDL: 45.4 mg/dL (ref >39.00)
LDL Cholesterol: 39 mg/dL (ref 0–99)
NonHDL: 70.2
Total CHOL/HDL Ratio: 3
Triglycerides: 154 mg/dL — ABNORMAL HIGH (ref 0.0–149.0)
VLDL: 30.8 mg/dL (ref 0.0–40.0)

## 2024-08-23 LAB — CBC WITH DIFFERENTIAL/PLATELET
Basophils Absolute: 0 K/uL (ref 0.0–0.1)
Basophils Relative: 0.5 % (ref 0.0–3.0)
Eosinophils Absolute: 0.1 K/uL (ref 0.0–0.7)
Eosinophils Relative: 1.4 % (ref 0.0–5.0)
HCT: 50.8 % — ABNORMAL HIGH (ref 36.0–46.0)
Hemoglobin: 16.6 g/dL — ABNORMAL HIGH (ref 12.0–15.0)
Lymphocytes Relative: 20.2 % (ref 12.0–46.0)
Lymphs Abs: 1.3 K/uL (ref 0.7–4.0)
MCHC: 32.7 g/dL (ref 30.0–36.0)
MCV: 96.3 fl (ref 78.0–100.0)
Monocytes Absolute: 0.5 K/uL (ref 0.1–1.0)
Monocytes Relative: 6.8 % (ref 3.0–12.0)
Neutro Abs: 4.7 K/uL (ref 1.4–7.7)
Neutrophils Relative %: 71.1 % (ref 43.0–77.0)
Platelets: 137 K/uL — ABNORMAL LOW (ref 150.0–400.0)
RBC: 5.28 Mil/uL — ABNORMAL HIGH (ref 3.87–5.11)
RDW: 15.5 % (ref 11.5–15.5)
WBC: 6.6 K/uL (ref 4.0–10.5)

## 2024-08-23 LAB — HEMOGLOBIN A1C: Hgb A1c MFr Bld: 9 % — ABNORMAL HIGH (ref 4.6–6.5)

## 2024-08-23 MED ORDER — BREZTRI AEROSPHERE 160-9-4.8 MCG/ACT IN AERO: 2.0000 | INHALATION_SPRAY | Freq: Two times a day (BID) | RESPIRATORY_TRACT | 3 refills | Status: AC

## 2024-08-23 MED ORDER — ALBUTEROL SULFATE 1.25 MG/3ML IN NEBU: INHALATION_SOLUTION | RESPIRATORY_TRACT | 2 refills | Status: AC

## 2024-08-23 MED ORDER — ALBUTEROL SULFATE HFA 108 (90 BASE) MCG/ACT IN AERS: 2.0000 | INHALATION_SPRAY | Freq: Four times a day (QID) | RESPIRATORY_TRACT | 1 refills | Status: AC | PRN

## 2024-08-23 NOTE — Progress Notes (Unsigned)
 Patient ID: [name] [name], female    DOB: [birth year], 73 y.o.   MRN: [id number]   Assessment & Plan:  Type 2 diabetes mellitus with hyperglycemia, without long-term current use of insulin  (HCC) -     Comprehensive metabolic panel with GFR -     Hemoglobin A1c -     Lipid panel -     Microalbumin / creatinine urine ratio  Stage 3b chronic kidney disease (HCC)  Hyperlipidemia associated with type 2 diabetes mellitus (HCC), on Lipitor -     Lipid panel  Polycythemia, secondary, followed by Heme, therapeutic phlebotomy -     CBC with Differential/Platelet  Stress at home  Immunization due -     Flu vaccine HIGH DOSE PF(Fluzone Trivalent)  Skin cancer screening -     Ambulatory referral to Dermatology  Hypothyroidism (acquired) -     TSH  Moderate persistent asthmatic bronchitis with acute exacerbation -     Albuterol  Sulfate HFA; Inhale 2 puffs into the lungs every 6 (six) hours as needed for wheezing or shortness of breath.  Dispense: 17 each; Refill: 1  Other orders -     Albuterol  Sulfate; TAKE 3 MLS BY NEBULIZATION THREE TIMES DAILY AS NEEDED FOR WHEEZING  Dispense: 351 each; Refill: 2 -     Breztri  Aerosphere; Inhale 2 puffs into the lungs 2 (two) times daily.  Dispense: 32.1 g; Refill: 3    Assessment & Plan Type 2 diabetes mellitus with hyperglycemia Type 2 diabetes with hyperglycemia, managed with Invokana  100 mg and glipizide  XL 10 mg. Previous intolerance to GLP-1 agonists due to severe constipation. Metformin  discontinued due to renal function concerns. Last A1c was 7.9%. She is resistant to insulin  therapy. Recent hyperglycemia episodes with blood sugar reaching 400 mg/dL, possibly exacerbated by prednisone  use. - Ordered A1c test - Encouraged follow-up with endocrinologist - Discussed potential need for insulin  therapy Lab Results  Component Value Date   HGBA1C 9.0 (H) 08/23/2024   HGBA1C 7.9 (A) 05/24/2024   HGBA1C 8.1 (A) 02/17/2024     Chronic kidney  disease, stage 3b Chronic kidney disease stage 3b with previous GFR of 45.94. Last renal function assessment was in December of last year. Current management includes monitoring renal function due to medication adjustments. She is on Invokana . BP good control. - Ordered CMP to assess kidney function - Ordered urine microalbumin test  Secondary polycythemia Likely secondary to obstructive sleep apnea, smoking, and dehydration. Hematocrit management through therapeutic phlebotomy as advised by prior hematologist. - Ordered CBC to assess hemoglobin levels - Encouraged blood donation to maintain hematocrit <55  Hypothyroidism Managed with levothyroxine  125 mcg. TSH levels need reassessment. - Ordered TSH test  Hyperlipidemia Managed with Lipitor 10 mg. Cholesterol levels need reassessment. - Ordered cholesterol panel  Tobacco use disorder Continued tobacco use for stress management. Not interested in quitting at this time.  Depression and stress Managed with Xanax . Stress exacerbated by caregiving responsibilities for husband with Alzheimer's. - Continue current Xanax  prescription  General Health Maintenance Received flu shot today. Due for routine health maintenance screenings. - Ordered dermatology referral for skin cancer screening      Return in about 3 months (around 11/21/2024) for recheck/follow-up.    Subjective:    Chief Complaint  Patient presents with   Medical Management of Chronic Issues    3 month follow up for diabetes. Still having difficulty breathing and a cough. Has not fasted for labs today. Still taking OTC mucus meds to help  congestion.    Diabetes    HPI Discussed the use of AI scribe software for clinical note transcription with the patient, who gave verbal consent to proceed.  History of Present Illness [name] [name] is a 73 year old female with type two diabetes who presents for a regular follow-up. She is accompanied by her husband, who has  Alzheimer's and dementia.  She is currently taking Invokana  100 mg and glipizide  XL 10 mg for her diabetes. Her blood sugar has been poorly controlled since her medication was decreased from 300 mg to 150 mg. She has been taking more than the prescribed dose of glipizide  in the evening due to high blood sugar levels, which sometimes exceed 300 mg/dL. She has a history of severe constipation with GLP-1 medications and stopped metformin  due to decreased renal function. Her last A1c was 7.9%.  She has hyperlipidemia and is due for a cholesterol panel today. She is currently on Lipitor 10 mg.  She has stage 3B chronic kidney disease, with her last GFR recorded at 45.94. Her kidney function has not been checked since December of last year. She experiences frequent urination, which she attributes to high blood sugar levels. No symptoms of a urinary tract infection.  She has polycythemia. She has not seen a hematologist recently and has not been able to donate blood as recommended.  She has hypothyroidism and is currently taking levothyroxine  125 mcg. She is due for a TSH check today.  She experiences stress at home as she is the primary caregiver for her husband, who has Alzheimer's and dementia. She is on Xanax  for stress management and is stable on this medication.  She smokes and is not interested in quitting at this time, citing stress as a reason for continued smoking.  She reports feeling 'weird' and not herself, which led to the cancellation of a recent appointment with her endocrinologist.     Past Medical History:  Diagnosis Date   Bronchitis, asthmatic    Coronary artery disease    BMS to LAD 02/2008   Diabetes mellitus without complication (HCC)    Hyperlipidemia    Hypertensive heart disease 04/16/2016   Hypothyroidism    MI, old    Morbid obesity (HCC) 04/16/2016   S/P coronary artery stent placement 04/16/2016   BMS to LAD 2009   Tobacco abuse 04/16/2016    Past Surgical  History:  Procedure Laterality Date   ABDOMINAL HYSTERECTOMY     35; very heavy bleeding   ANKLE SURGERY Right    CHOLECYSTECTOMY     CORONARY ANGIOPLASTY  02/2008   bare metal stent to LAD    CORONARY STENT PLACEMENT     KNEE ARTHROSCOPY     OOPHORECTOMY Left    age 59   ROTATOR CUFF REPAIR Bilateral    THORACIC OUTLET SURGERY     WRIST SURGERY      Family History  Problem Relation Age of Onset   Cancer Mother    Diabetes Son    Hypertension Son     Social History   Tobacco Use   Smoking status: Every Day    Current packs/day: 0.25    Average packs/day: 0.3 packs/day for 31.0 years (7.8 ttl pk-yrs)    Types: Cigarettes    Passive exposure: Current   Smokeless tobacco: Never   Tobacco comments:    1 PPD - down to 5-6 cigarettes a day (07/18/24)  Vaping Use   Vaping status: Never Used  Substance Use Topics  Alcohol use: No   Drug use: No     Allergies  Allergen Reactions   Clindamycin/Lincomycin Other (See Comments)    Head ache    Codeine  Hives   Cortisone    Dilaudid [Hydromorphone Hcl] Nausea And Vomiting   Iodides    Reglan [Metoclopramide] Other (See Comments)    Other reaction(s): Dystonia Hallucinations/Violent   Tramadol    Trulicity  [Dulaglutide ] Other (See Comments)    Severe constipation   Tape Rash    Blisters     Review of Systems NEGATIVE UNLESS OTHERWISE INDICATED IN HPI      Objective:     BP 122/78   Pulse 72   Temp (!) 97.2 F (36.2 C) (Temporal)   Ht 5' 5 (1.651 m)   Wt 296 lb 3.2 oz (134.4 kg)   LMP  (LMP Unknown)   SpO2 96%   BMI 49.29 kg/m   Wt Readings from Last 3 Encounters:  08/23/24 296 lb 3.2 oz (134.4 kg)  08/02/24 291 lb 9.6 oz (132.3 kg)  07/18/24 293 lb 4.8 oz (133 kg)    BP Readings from Last 3 Encounters:  08/23/24 122/78  08/02/24 120/70  07/18/24 128/82     Physical Exam Vitals and nursing note reviewed.  Constitutional:      Appearance: Normal appearance. She is obese.  HENT:      Mouth/Throat:     Mouth: Mucous membranes are moist.  Eyes:     Extraocular Movements: Extraocular movements intact.     Conjunctiva/sclera: Conjunctivae normal.     Pupils: Pupils are equal, round, and reactive to light.  Cardiovascular:     Rate and Rhythm: Normal rate and regular rhythm.     Pulses: Normal pulses.     Heart sounds: No murmur heard. Pulmonary:     Effort: Pulmonary effort is normal.     Breath sounds: Normal breath sounds. No decreased air movement. No decreased breath sounds or wheezing.  Musculoskeletal:     Right shoulder: No tenderness, bony tenderness or crepitus. Normal range of motion. Normal strength.  Skin:    Comments: Bluish-colored nose  Neurological:     General: No focal deficit present.     Mental Status: She is alert and oriented to person, place, and time.  Psychiatric:        Mood and Affect: Mood normal.        Behavior: Behavior normal.             Shelbee Apgar M Aquarius Latouche, PA-C

## 2024-08-24 LAB — MICROALBUMIN / CREATININE URINE RATIO
Creatinine,U: 57 mg/dL
Microalb Creat Ratio: 20.2 mg/g (ref 0.0–30.0)
Microalb, Ur: 1.2 mg/dL (ref 0.0–1.9)

## 2024-08-25 ENCOUNTER — Other Ambulatory Visit: Payer: Self-pay

## 2024-08-25 ENCOUNTER — Other Ambulatory Visit: Payer: Self-pay | Admitting: Physician Assistant

## 2024-08-25 DIAGNOSIS — E1165 Type 2 diabetes mellitus with hyperglycemia: Secondary | ICD-10-CM

## 2024-08-25 MED ORDER — GLIPIZIDE ER 10 MG PO TB24: 10.0000 mg | ORAL_TABLET | Freq: Every day | ORAL | 1 refills | Status: AC

## 2024-08-25 NOTE — Patient Instructions (Signed)
  VISIT SUMMARY: Today, we reviewed your diabetes management, kidney function, and other health concerns. We also discussed your stress levels and caregiving responsibilities.  YOUR PLAN: TYPE 2 DIABETES MELLITUS WITH HYPERGLYCEMIA: Your blood sugar levels have been high, and your last A1c was 7.9%. -We ordered an A1c test to check your blood sugar control. -Please follow up with your endocrinologist. -We discussed the potential need for insulin  therapy.  CHRONIC KIDNEY DISEASE, STAGE 3B: Your kidney function needs to be monitored regularly. -We ordered a comprehensive metabolic panel (CMP) to assess your kidney function. -We also ordered a urine microalbumin test.  SECONDARY POLYCYTHEMIA: You have high red blood cell levels, likely due to sleep apnea, smoking, and dehydration. -We ordered a complete blood count (CBC) to check your hemoglobin levels. -Please try to donate blood to keep your hematocrit levels below 55.  HYPOTHYROIDISM: Your thyroid  function needs to be reassessed. -We ordered a TSH test to check your thyroid  levels.  HYPERLIPIDEMIA: Your cholesterol levels need to be reassessed. -We ordered a cholesterol panel.  TOBACCO USE DISORDER: You continue to smoke for stress management. -encouraged cessation  DEPRESSION AND STRESS: You are experiencing stress, especially due to caregiving responsibilities. -Continue taking Xanax  as prescribed.  GENERAL HEALTH MAINTENANCE: You are due for routine health screenings. -You received a flu shot today. -We ordered a dermatology referral for a skin cancer screening.                      Contains text generated by [employer]bridge.                                 Contains text generated by [employer]bridge.

## 2024-08-30 ENCOUNTER — Telehealth: Payer: Self-pay

## 2024-08-30 ENCOUNTER — Other Ambulatory Visit: Payer: Self-pay

## 2024-08-30 DIAGNOSIS — Z1283 Encounter for screening for malignant neoplasm of skin: Secondary | ICD-10-CM

## 2024-08-30 NOTE — Telephone Encounter (Signed)
 Copied from CRM [id number]. Topic: Referral - Status >> Aug 30, 2024 10:58 AM Burnard DEL wrote: Reason for CRM: Dermatology specialist called in stating that they received referral for patient however they do not accept patients insurance,they will see her if she's willing to pay out of pocket. Also,they did not receive demographic information on patient.  Fax#: [phone]  Called pt and not willing to pay out of pocket; new referral to Dermatology placed for patient.

## 2024-09-01 ENCOUNTER — Ambulatory Visit: Payer: Self-pay | Admitting: Physician Assistant

## 2024-09-02 ENCOUNTER — Telehealth (HOSPITAL_BASED_OUTPATIENT_CLINIC_OR_DEPARTMENT_OTHER): Payer: Self-pay | Admitting: Cardiovascular Disease

## 2024-09-02 NOTE — Telephone Encounter (Signed)
 Returned call to pt, she has been made aware that her refill for Isosorbide  was sent in when she saw Reche Finder, NP, 07/18/24.

## 2024-09-02 NOTE — Telephone Encounter (Signed)
°*  STAT* If patient is at the pharmacy, call can be transferred to refill team.   1. Which medications need to be refilled? (please list name of each medication and dose if known)   isosorbide  mononitrate (IMDUR ) 60 MG 24 hr tablet   2. Would you like to learn more about the convenience, safety, & potential cost savings by using the [facility]?   3. Are you open to using the [facility] (Type [facility]. ).  4. Which pharmacy/location (including street and city if local pharmacy) is medication to be sent to?  [id number] - [place], [place] - [address] AT [facility] OF [address]   5. Do they need a 30 day or 90 day supply?   90 day  Patient stated she has 6 tablets left.

## 2024-09-06 ENCOUNTER — Other Ambulatory Visit: Payer: Self-pay | Admitting: Physician Assistant

## 2024-09-06 DIAGNOSIS — F419 Anxiety disorder, unspecified: Secondary | ICD-10-CM

## 2024-09-06 NOTE — Telephone Encounter (Unsigned)
 Copied from CRM #[id number]. Topic: Clinical - Medication Refill >> Sep 06, 2024  3:51 PM Rea ORN wrote: Medication:  ALPRAZolam  (XANAX ) 0.5 MG tablet   Has the patient contacted their pharmacy? Yes (Agent: If no, request that the patient contact the pharmacy for the refill. If patient does not wish to contact the pharmacy document the reason why and proceed with request.) (Agent: If yes, when and what did the pharmacy advise?)  This is the patient's preferred pharmacy:  [id number] [place], [place] - [address] AT [facility] OF [facility] & [address] [place] [address] Phone: [phone] Fax: [phone]    Is this the correct pharmacy for this prescription? Yes If no, delete pharmacy and type the correct one.   Has the prescription been filled recently? No  Is the patient out of the medication? Yes  Has the patient been seen for an appointment in the last year OR does the patient have an upcoming appointment? Yes  Can we respond through MyChart? No  Agent: Please be advised that Rx refills may take up to 3 business days. We ask that you follow-up with your pharmacy.

## 2024-09-07 ENCOUNTER — Telehealth: Payer: Self-pay

## 2024-09-07 MED ORDER — ALPRAZOLAM 0.5 MG PO TABS: ORAL_TABLET | ORAL | 2 refills | Status: AC

## 2024-09-07 NOTE — Telephone Encounter (Signed)
 Sending as an FINANCIAL PLANNER  Copied from [employer] [id number]. Topic: Clinical - Lab/Test Results >> Sep 06, 2024  3:58 PM Rea ORN wrote: Reason for CRM: Pt called to inquire about lab results. I advised PCP message from 09/01/24

## 2024-09-07 NOTE — Telephone Encounter (Signed)
 Noted and agreed, thank you.

## 2024-10-03 ENCOUNTER — Ambulatory Visit: Admitting: Podiatry

## 2024-10-12 ENCOUNTER — Telehealth: Payer: Self-pay

## 2024-10-12 ENCOUNTER — Other Ambulatory Visit: Payer: Self-pay

## 2024-10-12 ENCOUNTER — Other Ambulatory Visit: Payer: Self-pay | Admitting: Physician Assistant

## 2024-10-12 MED ORDER — LEVOTHYROXINE SODIUM 125 MCG PO TABS: 125.0000 ug | ORAL_TABLET | Freq: Every day | ORAL | 0 refills | Status: AC

## 2024-10-12 MED ORDER — PANTOPRAZOLE SODIUM 40 MG PO TBEC: 40.0000 mg | DELAYED_RELEASE_TABLET | Freq: Every day | ORAL | 1 refills | Status: AC

## 2024-10-12 NOTE — Telephone Encounter (Signed)
 Spoke with [facility] office regarding pt referral being closed and advised pt is wanting to come to [facility] location due to being closer and easier for patient. Was advised by Rilla at Pulmonary office patient can call to schedule an appt now and just advise needing to seen at [facility] location. Pt has been advised and has phone number to call and schedule appt.

## 2024-10-12 NOTE — Telephone Encounter (Signed)
 Copied from CRM #[id number]. Topic: Clinical - Medication Refill >> Oct 12, 2024 12:53 PM Gustabo D wrote: Medication: glipiZIDE  (GLUCOTROL  XL) 10 MG 24 hr tablet levothyroxine  (SYNTHROID ) 125 MCG tablet pantoprazole  (PROTONIX ) 40 MG tablet Pt is out of refills Pt is out of refills  Has the patient contacted their pharmacy? No (Agent: If no, request that the patient contact the pharmacy for the refill. If patient does not wish to contact the pharmacy document the reason why and proceed with request.) (Agent: If yes, when and what did the pharmacy advise?)  This is the patient's preferred pharmacy:  [id number] [place], [place] - [address] AT [facility] OF [facility] & [address] [place] [address] Phone: [phone] Fax: [phone]    Is this the correct pharmacy for this prescription? Yes If no, delete pharmacy and type the correct one.   Has the prescription been filled recently? No  Is the patient out of the medication? Yes  Has the patient been seen for an appointment in the last year OR does the patient have an upcoming appointment? Yes  Can we respond through MyChart? No  Agent: Please be advised that Rx refills may take up to 3 business days. We ask that you follow-up with your pharmacy. >> Oct 12, 2024 12:55 PM Gustabo D wrote: Need before storm  Refilled Pantoprazole  and Levothyroxine  for patient however refill for Glipizide  is still at pharmacy waiting to be picked up from 08/25/24. Called pt to advise and pt verbalized understanding

## 2024-10-13 NOTE — Telephone Encounter (Signed)
 Noted and agreed, thank you.

## 2024-10-28 ENCOUNTER — Telehealth: Payer: Self-pay | Admitting: Pharmacist

## 2024-10-28 NOTE — Telephone Encounter (Signed)
 Patient states she has only 1 Breztri  inhaler left and will need delivery soon. Called [place] [employer] to check status of next Breztri  refill and delivery.  Patient's last delivery was 07/22/2024 but she was not enrolled in auto refill.  Coordinated with [employer] to enroll patient in their auto refill program requested refill for Breztri . Patient should receive delivery in 10 to 14 business days.   Patient also mentioned she had had several large bills / expenses recently including having to purchase a new water heater and an oil bill that was > $400. She worries about being able to pay for her gas bill. I offered to have a care coordinator or social worker to call her to see if there are any community resources / grant that might help her with costs but patient declined. She states she is in the process of refinancing her home and this will offer her some money to pay office oil bill. Encouraged her to call office to request referral if she changes her mind.

## 2024-10-28 NOTE — Telephone Encounter (Signed)
 Tried to call patient to follow up on 2026 medication need. It looks like our med assist team has gotten confirmation that patient will get Breztri  from [facility] and Me program for 2026.  We have also helped with Invokana  patient assistance program in past.  I was unable to reach patient to see if she needs assistance for 2026 with Invokana . LM on VM with my call back number [phone]

## 2024-10-28 NOTE — Telephone Encounter (Signed)
 Confirmed patient was approved for Invokana  patient assistance program starting 01/01/026 thru 09/21/2025

## 2024-11-02 ENCOUNTER — Ambulatory Visit: Admitting: Podiatry

## 2024-11-14 ENCOUNTER — Ambulatory Visit: Admitting: "Endocrinology

## 2024-11-15 ENCOUNTER — Encounter (HOSPITAL_BASED_OUTPATIENT_CLINIC_OR_DEPARTMENT_OTHER): Admitting: Pulmonary Disease

## 2024-11-22 ENCOUNTER — Ambulatory Visit: Admitting: Physician Assistant

## 2025-02-22 ENCOUNTER — Ambulatory Visit

## 2025-06-27 ENCOUNTER — Other Ambulatory Visit: Admitting: Pharmacist
